# Patient Record
Sex: Male | Born: 1970
Health system: Southern US, Community
[De-identification: ages and names within clinical notes are randomized; demographics above are authoritative.]

## PROBLEM LIST (undated history)

## (undated) DIAGNOSIS — Z992 Dependence on renal dialysis: Secondary | ICD-10-CM

## (undated) DIAGNOSIS — E119 Type 2 diabetes mellitus without complications: Secondary | ICD-10-CM

## (undated) DIAGNOSIS — G629 Polyneuropathy, unspecified: Secondary | ICD-10-CM

## (undated) DIAGNOSIS — K219 Gastro-esophageal reflux disease without esophagitis: Secondary | ICD-10-CM

## (undated) DIAGNOSIS — M1990 Unspecified osteoarthritis, unspecified site: Secondary | ICD-10-CM

## (undated) DIAGNOSIS — J45909 Unspecified asthma, uncomplicated: Secondary | ICD-10-CM

## (undated) DIAGNOSIS — G4730 Sleep apnea, unspecified: Secondary | ICD-10-CM

## (undated) DIAGNOSIS — N289 Disorder of kidney and ureter, unspecified: Secondary | ICD-10-CM

## (undated) DIAGNOSIS — E785 Hyperlipidemia, unspecified: Secondary | ICD-10-CM

## (undated) DIAGNOSIS — N400 Enlarged prostate without lower urinary tract symptoms: Secondary | ICD-10-CM

## (undated) DIAGNOSIS — I10 Essential (primary) hypertension: Secondary | ICD-10-CM

## (undated) DIAGNOSIS — M109 Gout, unspecified: Secondary | ICD-10-CM

## (undated) DIAGNOSIS — G2581 Restless legs syndrome: Secondary | ICD-10-CM

## (undated) HISTORY — PX: ANKLE SURGERY: SHX546

---

## 2005-12-12 ENCOUNTER — Emergency Department: Payer: Self-pay | Admitting: Emergency Medicine

## 2011-12-31 ENCOUNTER — Observation Stay: Payer: Self-pay | Admitting: Internal Medicine

## 2011-12-31 LAB — CBC WITH DIFFERENTIAL/PLATELET
Basophil %: 2.1 %
Eosinophil #: 0.2 10*3/uL (ref 0.0–0.7)
Eosinophil %: 2.4 %
HCT: 42.4 % (ref 40.0–52.0)
HGB: 14.2 g/dL (ref 13.0–18.0)
Lymphocyte #: 2 10*3/uL (ref 1.0–3.6)
Lymphocyte %: 19.1 %
MCH: 29.2 pg (ref 26.0–34.0)
MCHC: 33.5 g/dL (ref 32.0–36.0)
MCV: 87 fL (ref 80–100)
Monocyte #: 0.6 x10 3/mm (ref 0.2–1.0)
Neutrophil #: 7.2 10*3/uL — ABNORMAL HIGH (ref 1.4–6.5)
Neutrophil %: 70.2 %
RBC: 4.85 10*6/uL (ref 4.40–5.90)

## 2011-12-31 LAB — BASIC METABOLIC PANEL
Anion Gap: 9 (ref 7–16)
Calcium, Total: 9 mg/dL (ref 8.5–10.1)
Chloride: 108 mmol/L — ABNORMAL HIGH (ref 98–107)
Co2: 28 mmol/L (ref 21–32)
EGFR (African American): 55 — ABNORMAL LOW
EGFR (Non-African Amer.): 48 — ABNORMAL LOW
Osmolality: 287 (ref 275–301)
Potassium: 3.5 mmol/L (ref 3.5–5.1)
Sodium: 145 mmol/L (ref 136–145)

## 2011-12-31 LAB — CK TOTAL AND CKMB (NOT AT ARMC): CK-MB: 2.9 ng/mL (ref 0.5–3.6)

## 2011-12-31 LAB — TROPONIN I
Troponin-I: 0.05 ng/mL
Troponin-I: 0.06 ng/mL — ABNORMAL HIGH

## 2012-12-15 ENCOUNTER — Emergency Department: Payer: Self-pay | Admitting: Emergency Medicine

## 2013-09-27 ENCOUNTER — Emergency Department: Payer: Self-pay | Admitting: Emergency Medicine

## 2013-11-16 ENCOUNTER — Ambulatory Visit: Payer: Self-pay | Admitting: Family Medicine
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
## 2014-11-27 DIAGNOSIS — G4733 Obstructive sleep apnea (adult) (pediatric): Secondary | ICD-10-CM | POA: Insufficient documentation

## 2015-11-30 ENCOUNTER — Encounter: Payer: Self-pay | Admitting: Emergency Medicine

## 2015-11-30 ENCOUNTER — Emergency Department
Admission: EM | Admit: 2015-11-30 | Discharge: 2015-11-30 | Disposition: A | Discharge status: Home / Self Care | Payer: Medicare Other | Attending: Emergency Medicine | Admitting: Emergency Medicine

## 2015-11-30 DIAGNOSIS — K047 Periapical abscess without sinus: Secondary | ICD-10-CM

## 2015-11-30 DIAGNOSIS — F17210 Nicotine dependence, cigarettes, uncomplicated: Secondary | ICD-10-CM | POA: Insufficient documentation

## 2015-11-30 MED ORDER — LIDOCAINE HCL (PF) 1 % IJ SOLN
INTRAMUSCULAR | Status: AC
  Filled 2015-11-30: qty 5

## 2015-11-30 MED ORDER — CEFTRIAXONE SODIUM 250 MG IJ SOLR
250.0000 mg | Freq: Once | INTRAMUSCULAR | Status: AC
  Administered 2015-11-30: 250 mg via INTRAMUSCULAR
  Filled 2015-11-30: qty 250

## 2015-11-30 MED ORDER — HYDROCODONE-ACETAMINOPHEN 5-325 MG PO TABS: 1.0000 | ORAL_TABLET | ORAL | 0 refills | Status: DC | PRN

## 2015-11-30 NOTE — ED Triage Notes (Signed)
Pt has a abscessed tooth and he "popped" it today. His mother told him it had poison in it and he is now worried about it.

## 2015-11-30 NOTE — ED Provider Notes (Signed)
[facility] Emergency Department Provider Note  ____________________________________________  Time seen: Approximately 2:48 PM  I have reviewed the triage vital signs and the nursing notes.   HISTORY  Chief Complaint Abscess    HPI Donald Fernandez is a 45 y.o. male presents for evaluation of dental abscess. Patient states that he had abscesses about this morning took a razor blade and popped it. Now concerned for infection. Denies any other complaints at this time.   History reviewed. No pertinent past medical history.  There are no active problems to display for this patient.   History reviewed. No pertinent surgical history.  Prior to Admission medications   Medication Sig Start Date End Date Taking? Authorizing Provider  HYDROcodone-acetaminophen (NORCO) 5-325 MG tablet Take 1-2 tablets by mouth every 4 (four) hours as needed for moderate pain. 11/30/15   Arlyss Repress, PA-C    Allergies Review of patient's allergies indicates no known allergies.  History reviewed. No pertinent family history.  Social History Social History  Substance Use Topics  . Smoking status: Current Every Day Smoker    Packs/day: 0.50    Types: Cigarettes  . Smokeless tobacco: Never Used  . Alcohol use Yes     Comment: ocassionally    Review of Systems Constitutional: No fever/chills ENT: No sore throat.Positive for dental abscess. Cardiovascular: Denies chest pain. Respiratory: Denies shortness of breath. Musculoskeletal: Negative for back pain. Skin: Negative for rash. Neurological: Negative for headaches, focal weakness or numbness.  10-point ROS otherwise negative.  ____________________________________________   PHYSICAL EXAM:  VITAL SIGNS: ED Triage Vitals  Enc Vitals Group     BP 11/30/15 1351 (!) 160/97     Pulse Rate 11/30/15 1351 71     Resp --      Temp 11/30/15 1351 98.8 F (37.1 C)     Temp Source 11/30/15 1351 Oral     SpO2  11/30/15 1351 96 %     Weight 11/30/15 1352 (!) 340 lb (154.2 kg)     Height 11/30/15 1352 5\' 11"  (1.803 m)     Head Circumference --      Peak Flow --      Pain Score --      Pain Loc --      Pain Edu? --      Excl. in [place]? --     Constitutional: Alert and oriented. Well appearing and in no acute distress. Mouth/Throat: Mucous membranes are moist.  Oropharynx non-erythematous.Obvious dental caries noted. Neck: No stridor.  Supple, full range of motion nontender. Cardiovascular: Normal rate, regular rhythm. Grossly normal heart sounds.  Good peripheral circulation. Respiratory: Normal respiratory effort.  No retractions. Lungs CTAB. Musculoskeletal: No lower extremity tenderness nor edema.  No joint effusions. Neurologic:  Normal speech and language. No gross focal neurologic deficits are appreciated. No gait instability. Skin:  Skin is warm, dry and intact. No rash noted. Psychiatric: Mood and affect are normal. Speech and behavior are normal.  ____________________________________________   LABS (all labs ordered are listed, but only abnormal results are displayed)  Labs Reviewed - No data to display ____________________________________________  EKG   ____________________________________________  RADIOLOGY   ____________________________________________   PROCEDURES  Procedure(s) performed: None  Critical Care performed: No  ____________________________________________   INITIAL IMPRESSION / ASSESSMENT AND PLAN / ED COURSE  Pertinent labs & imaging results that were available during my care of the patient were reviewed by me and considered in my medical decision making (see chart for details).  Review of the [place] CSRS was performed in accordance of the [place] prior to dispensing any controlled drugs.  Acute dental abscess. Patient was given Rocephin 250 mg IM while in the ED. Discharged home for follow-up to the dentist as prescribed.  Clinical Course     ____________________________________________   FINAL CLINICAL IMPRESSION(S) / ED DIAGNOSES  Final diagnoses:  Dental abscess     This chart was dictated using voice recognition software/Dragon. Despite best efforts to proofread, errors can occur which can change the meaning. Any change was purely unintentional.    Arlyss Repress, PA-C 11/30/15 1602    Lisa Roca, MD 12/01/15 1212

## 2015-11-30 NOTE — Discharge Instructions (Signed)
OPTIONS FOR DENTAL FOLLOW UP CARE ° °[place] Department of Health and Human Services - Local Safety Net Dental Clinics °http://www.ncdhhs.gov/dph/oralhealth/services/safetynetclinics.htm °  °[facility] (336-562-3123) ° °[facility] (919-933-9087) ° °[facility] ([phone] ext [phone]) ° °[facility] ([phone]) ° °[facility] ([phone]) °This clinic caters to the indigent population and is on a lottery system. °Location: °[facility], [address], [place] °Clinic Hours: °Wednesdays from 6pm - 9pm, patients seen by a lottery system. °For dates, call or go to www.med.[facility].edu/[facility]/patients/Dental-[facility] °Services: °Cleanings, fillings and simple extractions. °Payment Options: °DENTAL WORK IS FREE OF CHARGE. Bring proof of income or support. °Best way to get seen: °Arrive at 5:15 pm - this is a lottery, NOT first come/first serve, so arriving earlier will not increase your chances of being seen. °  °  °[facility] °[phone] °Select option 1 for emergencies °  °Location: °[facility], [address], [place] °Clinic Hours: °No walk-ins accepted - call the day before to schedule an appointment. °Check in times are 9:30 am and 1:30 pm. °Services: °Simple extractions, temporary fillings, pulpectomy/pulp debridement, uncomplicated abscess drainage. °Payment Options: °PAYMENT IS DUE AT THE TIME OF SERVICE.  Fee is usually $100-200, additional surgical procedures (e.g. abscess drainage) may be extra. °Cash, checks, Visa/MasterCard accepted.  Can file Medicaid if patient is covered for dental - patient should call case worker to check. °No discount for [facility] patients. °Best way to get seen: °MUST call the day before and get onto the schedule. Can usually be seen the next 1-2 days. No walk-ins accepted. °  °  °[facility] °[phone] °   °Location: °[facility], [address], [place] °Clinic Hours: °M, W, Th, F 8am or 1:30pm, Tues 9a or 1:30 - first come/first served. °Services: °Simple extractions, temporary fillings, uncomplicated abscess drainage.  You do not need to be an [place] resident. °Payment Options: °PAYMENT IS DUE AT THE TIME OF SERVICE. °Dental insurance, otherwise sliding scale - bring proof of income or support. °Depending on income and treatment needed, cost is usually $50-200. °Best way to get seen: °Arrive early as it is first come/first served. °  °  °[facility] °[phone] °  °Location: °[address] °Clinic Hours: °Mon-Thu 8a-5p °Services: °Most basic dental services including extractions and fillings. °Payment Options: °PAYMENT IS DUE AT THE TIME OF SERVICE. °Sliding scale, up to 50% off - bring proof if income or support. °Medicaid with dental option accepted. °Best way to get seen: °Call to schedule an appointment, can usually be seen within 2 weeks OR they will try to see walk-ins - show up at 8a or 2p (you may have to wait). °  °  °[facility] °919-245-2435 °[place] RESIDENTS ONLY °  °Location: °[facility], [address], [place], [place] [address] °Clinic Hours: By appointment only. °Monday - Thursday 8am-5pm, Friday 8am-12pm °Services: Cleanings, fillings, extractions. °Payment Options: °PAYMENT IS DUE AT THE TIME OF SERVICE. °Cash, Visa or MasterCard. Sliding scale - $30 minimum per service. °Best way to get seen: °Come in to office, complete packet and make an appointment - need proof of income °or support monies for each household member and proof of [place] residence. °Usually takes about a month to get in. °  °  °[facility] °[phone] °  °Location: °[address]   [place] °Clinic Hours: Walk-in Urgent Care Dental Services are offered Monday-Friday  mornings only. °The numbers of emergencies accepted daily is limited to the number of °providers available. °Maximum 15 - Mondays, Wednesdays & Thursdays °Maximum 10 - Tuesdays & Fridays °Services: °You do not need to be a [place] resident to be seen for a dental emergency. °Emergencies are defined as pain, swelling, abnormal bleeding, or dental trauma. Walkins will receive x-rays if needed. °NOTE: Dental cleaning is not an emergency. °Payment Options: °PAYMENT IS DUE AT THE TIME OF SERVICE. °Minimum co-pay is $40.00 for uninsured patients. °Minimum co-pay is $3.00 for Medicaid with dental coverage. °Dental Insurance is accepted and must be presented at time of visit. °Medicare does not cover dental. °Forms of payment: Cash, credit card, checks. °Best way to get seen: °If not previously registered with the clinic, walk-in dental registration begins at 7:15 am and is on a first come/first serve basis. °If previously registered with the clinic, call to make an appointment. °  °  °[facility] °919-776-4359 °[place] RESIDENTS ONLY °  °Location: °[address], [place], [place] °Clinic Hours: °Mon-Thu 10a-2p °Services: Extractions only! °Payment Options: °FREE (donations accepted) - bring proof of income or support °Best way to get seen: °Call and schedule an appointment OR come at 8am on the 1st Monday of every month (except for holidays) when it is first come/first served. °  °  °[facility] °[phone] °  °Location: °[address], [place] °Clinic Hours: °Friday mornings °Services, Payment Options, Best way to get seen: °Call for info °

## 2016-02-16 ENCOUNTER — Other Ambulatory Visit: Payer: Self-pay | Admitting: Ophthalmology

## 2016-02-16 DIAGNOSIS — H4710 Unspecified papilledema: Secondary | ICD-10-CM

## 2016-03-09 ENCOUNTER — Other Ambulatory Visit
Admission: RE | Admit: 2016-03-09 | Discharge: 2016-03-09 | Disposition: A | Discharge status: Home / Self Care | Payer: Medicare Other | Source: Ambulatory Visit | Attending: Ophthalmology | Admitting: Ophthalmology

## 2016-03-09 ENCOUNTER — Ambulatory Visit
Admission: RE | Admit: 2016-03-09 | Discharge: 2016-03-09 | Disposition: A | Discharge status: Home / Self Care | Payer: Medicare Other | Source: Ambulatory Visit | Attending: Ophthalmology | Admitting: Ophthalmology

## 2016-03-09 ENCOUNTER — Other Ambulatory Visit: Payer: Self-pay | Admitting: Ophthalmology

## 2016-03-09 DIAGNOSIS — H4710 Unspecified papilledema: Secondary | ICD-10-CM | POA: Insufficient documentation

## 2016-03-09 DIAGNOSIS — I6782 Cerebral ischemia: Secondary | ICD-10-CM | POA: Diagnosis not present

## 2016-03-09 LAB — CREATININE, SERUM
CREATININE: 4.36 mg/dL — AB (ref 0.61–1.24)
GFR calc Af Amer: 17 mL/min — ABNORMAL LOW (ref >60)
GFR, EST NON AFRICAN AMERICAN: 15 mL/min — AB (ref >60)

## 2016-05-17 DIAGNOSIS — H4710 Unspecified papilledema: Secondary | ICD-10-CM | POA: Diagnosis not present

## 2016-06-02 DIAGNOSIS — H6093 Unspecified otitis externa, bilateral: Secondary | ICD-10-CM | POA: Diagnosis not present

## 2016-07-08 DIAGNOSIS — I10 Essential (primary) hypertension: Secondary | ICD-10-CM | POA: Diagnosis not present

## 2016-07-08 DIAGNOSIS — E119 Type 2 diabetes mellitus without complications: Secondary | ICD-10-CM | POA: Diagnosis not present

## 2016-07-08 DIAGNOSIS — E669 Obesity, unspecified: Secondary | ICD-10-CM | POA: Diagnosis not present

## 2016-07-08 DIAGNOSIS — N184 Chronic kidney disease, stage 4 (severe): Secondary | ICD-10-CM | POA: Diagnosis not present

## 2016-07-08 DIAGNOSIS — G4733 Obstructive sleep apnea (adult) (pediatric): Secondary | ICD-10-CM | POA: Diagnosis not present

## 2016-07-08 DIAGNOSIS — H47093 Other disorders of optic nerve, not elsewhere classified, bilateral: Secondary | ICD-10-CM | POA: Diagnosis not present

## 2016-07-09 ENCOUNTER — Inpatient Hospital Stay
Admission: EM | Admit: 2016-07-09 | Discharge: 2016-07-11 | DRG: 683 | Disposition: A | Discharge status: Home / Self Care | Payer: PPO | Attending: Internal Medicine | Admitting: Internal Medicine

## 2016-07-09 ENCOUNTER — Emergency Department: Payer: PPO

## 2016-07-09 ENCOUNTER — Encounter: Payer: Self-pay | Admitting: Emergency Medicine

## 2016-07-09 DIAGNOSIS — Z794 Long term (current) use of insulin: Secondary | ICD-10-CM | POA: Diagnosis not present

## 2016-07-09 DIAGNOSIS — E872 Acidosis: Secondary | ICD-10-CM | POA: Diagnosis not present

## 2016-07-09 DIAGNOSIS — H6690 Otitis media, unspecified, unspecified ear: Secondary | ICD-10-CM | POA: Diagnosis present

## 2016-07-09 DIAGNOSIS — Z716 Tobacco abuse counseling: Secondary | ICD-10-CM | POA: Diagnosis not present

## 2016-07-09 DIAGNOSIS — Z8249 Family history of ischemic heart disease and other diseases of the circulatory system: Secondary | ICD-10-CM

## 2016-07-09 DIAGNOSIS — Z7982 Long term (current) use of aspirin: Secondary | ICD-10-CM | POA: Diagnosis not present

## 2016-07-09 DIAGNOSIS — E1122 Type 2 diabetes mellitus with diabetic chronic kidney disease: Secondary | ICD-10-CM | POA: Diagnosis not present

## 2016-07-09 DIAGNOSIS — E876 Hypokalemia: Secondary | ICD-10-CM | POA: Diagnosis present

## 2016-07-09 DIAGNOSIS — R7989 Other specified abnormal findings of blood chemistry: Secondary | ICD-10-CM | POA: Diagnosis not present

## 2016-07-09 DIAGNOSIS — N179 Acute kidney failure, unspecified: Secondary | ICD-10-CM | POA: Diagnosis not present

## 2016-07-09 DIAGNOSIS — Z79899 Other long term (current) drug therapy: Secondary | ICD-10-CM | POA: Diagnosis not present

## 2016-07-09 DIAGNOSIS — I120 Hypertensive chronic kidney disease with stage 5 chronic kidney disease or end stage renal disease: Secondary | ICD-10-CM | POA: Diagnosis present

## 2016-07-09 DIAGNOSIS — Z833 Family history of diabetes mellitus: Secondary | ICD-10-CM

## 2016-07-09 DIAGNOSIS — F17210 Nicotine dependence, cigarettes, uncomplicated: Secondary | ICD-10-CM | POA: Diagnosis not present

## 2016-07-09 DIAGNOSIS — N189 Chronic kidney disease, unspecified: Secondary | ICD-10-CM

## 2016-07-09 DIAGNOSIS — N185 Chronic kidney disease, stage 5: Secondary | ICD-10-CM | POA: Diagnosis not present

## 2016-07-09 DIAGNOSIS — D638 Anemia in other chronic diseases classified elsewhere: Secondary | ICD-10-CM | POA: Diagnosis not present

## 2016-07-09 DIAGNOSIS — N19 Unspecified kidney failure: Secondary | ICD-10-CM

## 2016-07-09 DIAGNOSIS — I10 Essential (primary) hypertension: Secondary | ICD-10-CM | POA: Diagnosis not present

## 2016-07-09 DIAGNOSIS — G4733 Obstructive sleep apnea (adult) (pediatric): Secondary | ICD-10-CM | POA: Diagnosis present

## 2016-07-09 DIAGNOSIS — Z6841 Body Mass Index (BMI) 40.0 and over, adult: Secondary | ICD-10-CM

## 2016-07-09 DIAGNOSIS — D696 Thrombocytopenia, unspecified: Secondary | ICD-10-CM | POA: Diagnosis present

## 2016-07-09 DIAGNOSIS — N184 Chronic kidney disease, stage 4 (severe): Secondary | ICD-10-CM | POA: Diagnosis not present

## 2016-07-09 HISTORY — DX: Disorder of kidney and ureter, unspecified: N28.9

## 2016-07-09 HISTORY — DX: Essential (primary) hypertension: I10

## 2016-07-09 HISTORY — DX: Type 2 diabetes mellitus without complications: E11.9

## 2016-07-09 LAB — APTT: APTT: 30 s (ref 24–36)

## 2016-07-09 LAB — URINALYSIS, COMPLETE (UACMP) WITH MICROSCOPIC
BACTERIA UA: NONE SEEN
BILIRUBIN URINE: NEGATIVE
GLUCOSE, UA: NEGATIVE mg/dL
Hgb urine dipstick: NEGATIVE
KETONES UR: NEGATIVE mg/dL
NITRITE: NEGATIVE
PROTEIN: 100 mg/dL — AB
Specific Gravity, Urine: 1.009 (ref 1.005–1.030)
pH: 6 (ref 5.0–8.0)

## 2016-07-09 LAB — CBC WITH DIFFERENTIAL/PLATELET
BASOS ABS: 0 10*3/uL (ref 0–0.1)
BASOS PCT: 1 %
Eosinophils Absolute: 0.3 10*3/uL (ref 0–0.7)
Eosinophils Relative: 5 %
HEMATOCRIT: 33 % — AB (ref 40.0–52.0)
Hemoglobin: 10.7 g/dL — ABNORMAL LOW (ref 13.0–18.0)
LYMPHS PCT: 14 %
Lymphs Abs: 0.8 10*3/uL — ABNORMAL LOW (ref 1.0–3.6)
MCH: 28.1 pg (ref 26.0–34.0)
MCHC: 32.3 g/dL (ref 32.0–36.0)
MCV: 86.9 fL (ref 80.0–100.0)
Monocytes Absolute: 0.6 10*3/uL (ref 0.2–1.0)
Monocytes Relative: 10 %
NEUTROS ABS: 3.8 10*3/uL (ref 1.4–6.5)
Neutrophils Relative %: 70 %
PLATELETS: 145 10*3/uL — AB (ref 150–440)
RBC: 3.8 MIL/uL — AB (ref 4.40–5.90)
RDW: 16.7 % — ABNORMAL HIGH (ref 11.5–14.5)
WBC: 5.5 10*3/uL (ref 3.8–10.6)

## 2016-07-09 LAB — COMPREHENSIVE METABOLIC PANEL
ALT: 46 U/L (ref 17–63)
ANION GAP: 8 (ref 5–15)
AST: 38 U/L (ref 15–41)
Albumin: 3.4 g/dL — ABNORMAL LOW (ref 3.5–5.0)
Alkaline Phosphatase: 75 U/L (ref 38–126)
BILIRUBIN TOTAL: 0.4 mg/dL (ref 0.3–1.2)
BUN: 78 mg/dL — ABNORMAL HIGH (ref 6–20)
CO2: 17 mmol/L — ABNORMAL LOW (ref 22–32)
Calcium: 8.3 mg/dL — ABNORMAL LOW (ref 8.9–10.3)
Chloride: 115 mmol/L — ABNORMAL HIGH (ref 101–111)
Creatinine, Ser: 7.72 mg/dL — ABNORMAL HIGH (ref 0.61–1.24)
GFR, EST AFRICAN AMERICAN: 9 mL/min — AB (ref >60)
GFR, EST NON AFRICAN AMERICAN: 7 mL/min — AB (ref >60)
Glucose, Bld: 104 mg/dL — ABNORMAL HIGH (ref 65–99)
POTASSIUM: 3.3 mmol/L — AB (ref 3.5–5.1)
Sodium: 140 mmol/L (ref 135–145)
TOTAL PROTEIN: 6.9 g/dL (ref 6.5–8.1)

## 2016-07-09 LAB — CBC
HCT: 32.7 % — ABNORMAL LOW (ref 40.0–52.0)
Hemoglobin: 10.6 g/dL — ABNORMAL LOW (ref 13.0–18.0)
MCH: 28.3 pg (ref 26.0–34.0)
MCHC: 32.5 g/dL (ref 32.0–36.0)
MCV: 87.1 fL (ref 80.0–100.0)
PLATELETS: 123 10*3/uL — AB (ref 150–440)
RBC: 3.76 MIL/uL — ABNORMAL LOW (ref 4.40–5.90)
RDW: 16.2 % — AB (ref 11.5–14.5)
WBC: 5.4 10*3/uL (ref 3.8–10.6)

## 2016-07-09 LAB — BETA-HYDROXYBUTYRIC ACID: BETA-HYDROXYBUTYRIC ACID: 0.1 mmol/L (ref 0.05–0.27)

## 2016-07-09 LAB — LACTIC ACID, PLASMA: LACTIC ACID, VENOUS: 0.7 mmol/L (ref 0.5–1.9)

## 2016-07-09 LAB — TSH: TSH: 2.476 u[IU]/mL (ref 0.350–4.500)

## 2016-07-09 LAB — GLUCOSE, CAPILLARY
Glucose-Capillary: 136 mg/dL — ABNORMAL HIGH (ref 65–99)
Glucose-Capillary: 106 mg/dL — ABNORMAL HIGH (ref 65–99)

## 2016-07-09 LAB — PROTIME-INR
INR: 1.06
Prothrombin Time: 13.8 seconds (ref 11.4–15.2)

## 2016-07-09 LAB — CREATININE, SERUM
Creatinine, Ser: 7.76 mg/dL — ABNORMAL HIGH (ref 0.61–1.24)
GFR calc Af Amer: 9 mL/min — ABNORMAL LOW (ref >60)
GFR calc non Af Amer: 7 mL/min — ABNORMAL LOW (ref >60)

## 2016-07-09 MED ORDER — CIPROFLOXACIN-DEXAMETHASONE 0.3-0.1 % OT SUSP
4.0000 [drp] | Freq: Two times a day (BID) | OTIC | Status: DC
  Administered 2016-07-09 – 2016-07-11 (×4): 4 [drp] via OTIC
  Filled 2016-07-09: qty 7.5

## 2016-07-09 MED ORDER — INSULIN ASPART 100 UNIT/ML ~~LOC~~ SOLN
0.0000 [IU] | Freq: Three times a day (TID) | SUBCUTANEOUS | Status: DC
  Administered 2016-07-09: 18:00:00 1 [IU] via SUBCUTANEOUS
  Filled 2016-07-09: qty 1

## 2016-07-09 MED ORDER — AMOXICILLIN 500 MG PO CAPS
500.0000 mg | ORAL_CAPSULE | Freq: Three times a day (TID) | ORAL | Status: DC
  Administered 2016-07-09 – 2016-07-10 (×3): 500 mg via ORAL
  Filled 2016-07-09 (×5): qty 1

## 2016-07-09 MED ORDER — ACETAMINOPHEN 650 MG RE SUPP: 650.0000 mg | Freq: Four times a day (QID) | RECTAL | Status: DC | PRN

## 2016-07-09 MED ORDER — GABAPENTIN 100 MG PO CAPS
100.0000 mg | ORAL_CAPSULE | Freq: Three times a day (TID) | ORAL | Status: DC
  Administered 2016-07-09 – 2016-07-11 (×6): 100 mg via ORAL
  Filled 2016-07-09 (×6): qty 1

## 2016-07-09 MED ORDER — ACETAMINOPHEN 325 MG PO TABS: 650.0000 mg | ORAL_TABLET | Freq: Four times a day (QID) | ORAL | Status: DC | PRN

## 2016-07-09 MED ORDER — AMLODIPINE BESYLATE 5 MG PO TABS
5.0000 mg | ORAL_TABLET | Freq: Every day | ORAL | Status: DC
  Administered 2016-07-09 – 2016-07-10 (×2): 5 mg via ORAL
  Filled 2016-07-09 (×2): qty 1

## 2016-07-09 MED ORDER — ONDANSETRON HCL 4 MG/2ML IJ SOLN: 4.0000 mg | Freq: Four times a day (QID) | INTRAMUSCULAR | Status: DC | PRN

## 2016-07-09 MED ORDER — SENNA 8.6 MG PO TABS
1.0000 | ORAL_TABLET | Freq: Two times a day (BID) | ORAL | Status: DC
  Administered 2016-07-10: 21:00:00 8.6 mg via ORAL
  Filled 2016-07-09 (×4): qty 1

## 2016-07-09 MED ORDER — NICOTINE 7 MG/24HR TD PT24
7.0000 mg | MEDICATED_PATCH | Freq: Every day | TRANSDERMAL | Status: DC | PRN
  Filled 2016-07-09 (×2): qty 1

## 2016-07-09 MED ORDER — DOCUSATE SODIUM 100 MG PO CAPS
100.0000 mg | ORAL_CAPSULE | Freq: Two times a day (BID) | ORAL | Status: DC
  Administered 2016-07-10: 100 mg via ORAL
  Filled 2016-07-09 (×4): qty 1

## 2016-07-09 MED ORDER — SODIUM CHLORIDE 0.9% FLUSH
3.0000 mL | Freq: Two times a day (BID) | INTRAVENOUS | Status: DC
  Administered 2016-07-09 – 2016-07-11 (×5): 3 mL via INTRAVENOUS

## 2016-07-09 MED ORDER — HEPARIN SODIUM (PORCINE) 5000 UNIT/ML IJ SOLN
5000.0000 [IU] | Freq: Three times a day (TID) | INTRAMUSCULAR | Status: DC
  Administered 2016-07-09 – 2016-07-11 (×5): 5000 [IU] via SUBCUTANEOUS
  Filled 2016-07-09 (×5): qty 1

## 2016-07-09 MED ORDER — ONDANSETRON HCL 4 MG PO TABS: 4.0000 mg | ORAL_TABLET | Freq: Four times a day (QID) | ORAL | Status: DC | PRN

## 2016-07-09 MED ORDER — SODIUM CHLORIDE 0.9 % IV SOLN
INTRAVENOUS | Status: DC
  Administered 2016-07-09 – 2016-07-11 (×4): via INTRAVENOUS

## 2016-07-09 MED ORDER — INSULIN ASPART 100 UNIT/ML ~~LOC~~ SOLN
0.0000 [IU] | Freq: Every day | SUBCUTANEOUS | Status: DC
Start: 2016-07-09 — End: 2016-07-11

## 2016-07-09 MED ORDER — INSULIN ASPART 100 UNIT/ML ~~LOC~~ SOLN
3.0000 [IU] | Freq: Three times a day (TID) | SUBCUTANEOUS | Status: DC
  Administered 2016-07-09 – 2016-07-10 (×3): 3 [IU] via SUBCUTANEOUS
  Filled 2016-07-09 (×3): qty 3

## 2016-07-09 NOTE — ED Notes (Signed)
Pt up to the restroom independently. Pt in NAD.

## 2016-07-09 NOTE — ED Triage Notes (Signed)
Patient presents to the ED from the [facility] clinic after patient had labs showing that his creatinine had increased from 3.46 at last check-up to 7.05 yesterday.  Patient also had a BUN of 70 and a GFR of 10.  Patient has history of stage 4 chronic kidney disease but is not on dialysis.  Patient denies any new symptoms.  Denies pain.

## 2016-07-09 NOTE — ED Notes (Signed)
Pt sent over from [facility] for abnormal lab work, kidney function was elevated, pt denies any urinary symptoms, states he is still making urine, denies any pain, awake and alert, family at bedside, EDP at bedside

## 2016-07-09 NOTE — H&P (Signed)
[facility] NAME: Donald Fernandez    MR#:  [id number]  DATE OF BIRTH:  1971-02-04  DATE OF ADMISSION:  07/09/2016  PRIMARY CARE PHYSICIAN: [facility]   REQUESTING/REFERRING PHYSICIAN:   CHIEF COMPLAINT:   Chief Complaint  Patient presents with  . Abnormal Lab    HISTORY OF PRESENT ILLNESS: Donald Fernandez  is a 46 y.o. male with a known history of Notable medical problems including obstructive sleep apnea, on CPAP, chronic renal failure, GFR 55 in September 2013, GFR 17. In November 2017, COPD, restless leg syndrome, gout, vitamin D deficiency, chronic right ankle pain, BPH, hypertension, tobacco abuse, hypercholesterolemia, diabetes, who presents to the hospital with complaints of worsening kidney function. Apparently patient had his labs checked at his primary care physician, was told that he had significant renal abnormality, was sent to emergency room for further evaluation and treatment. In emergency room, patient's creatinine was 7.72. Hospitalist services were contacted for admission  PAST MEDICAL HISTORY:   Past Medical History:  Diagnosis Date  . Diabetes mellitus without complication ([place])   . Hypertension   . Renal disorder    CKD4    PAST SURGICAL HISTORY: Past Surgical History:  Procedure Laterality Date  . ANKLE SURGERY      SOCIAL HISTORY:  Social History  Substance Use Topics  . Smoking status: Current Every Day Smoker    Packs/day: 0.50    Types: Cigarettes  . Smokeless tobacco: Never Used  . Alcohol use Yes     Comment: ocassionally    FAMILY HISTORY: The patient's mother has Sleep apnea , diabetes ,  coronary artery disease , hypertension .   DRUG ALLERGIES: No Known Allergies  Review of Systems  Constitutional: Negative for chills, fever and weight loss.  HENT: Negative for congestion.   Eyes: Negative for blurred vision and double vision.  Respiratory: Positive for  shortness of breath and wheezing. Negative for cough and sputum production.   Cardiovascular: Negative for chest pain, palpitations, orthopnea, leg swelling and PND.  Gastrointestinal: Negative for abdominal pain, blood in stool, constipation, diarrhea, nausea and vomiting.  Genitourinary: Negative for dysuria, frequency, hematuria and urgency.  Musculoskeletal: Negative for falls.  Neurological: Positive for weakness. Negative for dizziness, tremors, focal weakness and headaches.  Endo/Heme/Allergies: Does not bruise/bleed easily.  Psychiatric/Behavioral: Negative for depression. The patient does not have insomnia.     MEDICATIONS AT HOME:  Prior to Admission medications   Medication Sig Start Date End Date Taking? Authorizing Provider  acetaZOLAMIDE (DIAMOX) 250 MG tablet Take 250 mg by mouth 4 (four) times daily.   Yes Historical Provider, MD  allopurinol (ZYLOPRIM) 100 MG tablet Take 100 mg by mouth daily.   Yes Historical Provider, MD  amLODipine (NORVASC) 10 MG tablet Take 10 mg by mouth daily.   Yes Historical Provider, MD  amoxicillin (AMOXIL) 500 MG capsule Take 500 mg by mouth 3 (three) times daily.   Yes Historical Provider, MD  aspirin EC 81 MG tablet Take 81 mg by mouth daily.   Yes Historical Provider, MD  atorvastatin (LIPITOR) 20 MG tablet Take 20 mg by mouth daily.   Yes Historical Provider, MD  Cholecalciferol (VITAMIN D PO) Take 5,000 Units by mouth daily.   Yes Historical Provider, MD  ciprofloxacin-dexamethasone (CIPRODEX) otic suspension 4 drops 2 (two) times daily.   Yes Historical Provider, MD  cloNIDine (CATAPRES) 0.3 MG tablet Take 0.3 mg by mouth 3 (three)  times daily.   Yes Historical Provider, MD  doxazosin (CARDURA) 8 MG tablet Take 8 mg by mouth at bedtime.   Yes Historical Provider, MD  furosemide (LASIX) 40 MG tablet Take 40 mg by mouth.   Yes Historical Provider, MD  gabapentin (NEURONTIN) 300 MG capsule Take 1,200 mg by mouth daily.   Yes Historical  Provider, MD  glipiZIDE (GLUCOTROL XL) 5 MG 24 hr tablet Take 5 mg by mouth daily with breakfast.   Yes Historical Provider, MD  hydrALAZINE (APRESOLINE) 100 MG tablet Take 100 mg by mouth 3 (three) times daily.   Yes Historical Provider, MD  losartan (COZAAR) 50 MG tablet Take 50 mg by mouth daily.   Yes Historical Provider, MD  metoprolol (TOPROL-XL) 200 MG 24 hr tablet Take 200 mg by mouth daily.   Yes Historical Provider, MD  tamsulosin (FLOMAX) 0.4 MG CAPS capsule Take 0.4 mg by mouth.   Yes Historical Provider, MD  traZODone (DESYREL) 100 MG tablet Take 100 mg by mouth at bedtime.   Yes Historical Provider, MD  vitamin E 400 UNIT capsule Take 400 Units by mouth daily.   Yes Historical Provider, MD  HYDROcodone-acetaminophen (NORCO) 5-325 MG tablet Take 1-2 tablets by mouth every 4 (four) hours as needed for moderate pain. 11/30/15   Pierce Crane Beers, PA-C      PHYSICAL EXAMINATION:   VITAL SIGNS: Blood pressure (!) 116/94, pulse 65, temperature 98.3 F (36.8 C), temperature source Oral, resp. rate 17, height 5\' 11"  (1.803 m), weight (!) 172.4 kg (380 lb), SpO2 100 %.  GENERAL:  46 y.o.-year-old Morbidly obese patient lying in the bed with no acute distress.  EYES: Pupils equal, round, reactive to light and accommodation. No scleral icterus. Extraocular muscles intact.  HEENT: Head atraumatic, normocephalic. Oropharynx and nasopharynx clear.  NECK:  Supple, no jugular venous distention. No thyroid enlargement, no tenderness.  LUNGS: Markedly diminished breath sounds bilaterally, scattered wheezing, no rales,rhonchi or crepitations. Intermittent use of accessory muscles of respiration, with exertion and speech.  CARDIOVASCULAR: S1, S2 distant, rhythm is regular. No murmurs, rubs, or gallops.  ABDOMEN: Soft, nontender, nondistended. Bowel sounds present. No organomegaly or mass.  EXTREMITIES: 1+ lower extremity and pedal edema, no cyanosis, or clubbing.  NEUROLOGIC: Cranial nerves II  through XII are intact. Muscle strength 5/5 in all extremities. Sensation intact. Gait not checked.  PSYCHIATRIC: The patient is alert and oriented x 3. Morbidly obese, has difficulty to move , flat affect SKIN: No obvious rash, lesion, or ulcer.   LABORATORY PANEL:   CBC  Recent Labs Lab 07/09/16 0944  WBC 5.5  HGB 10.7*  HCT 33.0*  PLT 145*  MCV 86.9  MCH 28.1  MCHC 32.3  RDW 16.7*  LYMPHSABS 0.8*  MONOABS 0.6  EOSABS 0.3  BASOSABS 0.0   ------------------------------------------------------------------------------------------------------------------  Chemistries   Recent Labs Lab 07/09/16 0926  NA 140  K 3.3*  CL 115*  CO2 17*  GLUCOSE 104*  BUN 78*  CREATININE 7.72*  CALCIUM 8.3*  AST 38  ALT 46  ALKPHOS 75  BILITOT 0.4   ------------------------------------------------------------------------------------------------------------------  Cardiac Enzymes No results for input(s): TROPONINI in the last 168 hours. ------------------------------------------------------------------------------------------------------------------  RADIOLOGY: Ct Renal Stone Study  Result Date: 07/09/2016 CLINICAL DATA:  Elevated creatinine level EXAM: CT ABDOMEN AND PELVIS WITHOUT CONTRAST TECHNIQUE: Multidetector CT imaging of the abdomen and pelvis was performed following the standard protocol without IV contrast. COMPARISON:  None. FINDINGS: Lower chest: Bibasilar atelectasis. Hepatobiliary: Liver is unremarkable. Gallbladder is  unremarkable. There is a calcification adjacent to the neck of the gallbladder of unknown significance. Pancreas: Unremarkable Spleen: Unremarkable Adrenals/Urinary Tract: Kidneys and adrenal glands are unremarkable. Bladder is unremarkable. Stomach/Bowel: Normal appendix. No obvious mass. Moderate stool burden throughout the ascending and transverse colon. No evidence of small-bowel obstruction. Vascular/Lymphatic: No abnormal retroperitoneal adenopathy.  Reproductive: No evidence of prostate enlargement. Other: No free-fluid. Musculoskeletal: No vertebral compression deformity. Degenerative changes of the SI joints are noted. Mild lower lumbar facet arthropathy. IMPRESSION: No acute pathology. Electronically Signed   By: Marybelle Killings M.D.   On: 07/09/2016 11:06    EKG: Orders placed or performed in visit on 12/31/11  . EKG 12-Lead    IMPRESSION AND PLAN:  Active Problems:   Acute on chronic renal failure (HCC)  #1. Acute on chronic renal failure, admit patient to medical floor, suspend diuretics, ARB, initiate IV fluids, get nephrologist involved for recommendations, CT of abdomen and pelvis showed no hydronephrosis, stones #2. Essential hypertension, continue outpatient medications, suspend diuretics #3. Hypokalemia, supplement orally #4. Acidosis, likely due to renal failure, follow with therapy, may need bicarbonate orally  #5. Anemia of chronic disease, follow with hydration #6, thrombocytopenia, follow labs, get pro time/INR, PTT, get manual differential if worsens #7. Diabetes, continue sliding scale insulin while in the hospital, diabetic diet #8. Tobacco abuse. Counseling, discussed this patient for 4 minutes, nicotine replacement therapy will be initiated    All the records are reviewed and case discussed with ED provider. Management plans discussed with the patient, family and they are in agreement.  CODE STATUS: Code Status History    This patient does not have a recorded code status. Please follow your organizational policy for patients in this situation.       TOTAL TIME TAKING CARE OF THIS PATIENT: 50  minutes.    Theodoro Grist M.D on 07/09/2016 at 11:42 AM  Between 7am to 6pm - Pager - [phone] After 6pm go to [email or web address] - password [email or web address] [place] [facility]  Office  [phone]  CC: Primary care physician; [place]

## 2016-07-09 NOTE — ED Provider Notes (Signed)
[facility] Emergency Department Provider Note   ____________________________________________   First MD Initiated Contact with Patient 07/09/16 [phone]     (approximate)  I have reviewed the triage vital signs and the nursing notes.   HISTORY  Chief Complaint Abnormal Lab    HPI Donald Fernandez is a 46 y.o. male she had blood drawn that showed a elevated creatinine yesterday. It had increased from 7.46-7.05 yesterday it's higher today. Patient has high blood pressure and diabetes. Patient has no complaints she feels well and is not short of breath has no edema making the normal amount of urine however his creatinine has doubled since November.  Past Medical History:  Diagnosis Date  . Diabetes mellitus without complication ([place])   . Hypertension   . Renal disorder    CKD4    There are no active problems to display for this patient.   Past Surgical History:  Procedure Laterality Date  . ANKLE SURGERY      Prior to Admission medications   Medication Sig Start Date End Date Taking? Authorizing Provider  HYDROcodone-acetaminophen (NORCO) 5-325 MG tablet Take 1-2 tablets by mouth every 4 (four) hours as needed for moderate pain. 11/30/15   Arlyss Repress, PA-C    Allergies Patient has no known allergies.  No family history on file.  Social History Social History  Substance Use Topics  . Smoking status: Current Every Day Smoker    Packs/day: 0.50    Types: Cigarettes  . Smokeless tobacco: Never Used  . Alcohol use Yes     Comment: ocassionally    Review of Systems Constitutional: No fever/chills Eyes: No visual changes. ENT: No sore throat. Cardiovascular: Denies chest pain. Respiratory: Denies shortness of breath. Gastrointestinal: No abdominal pain.  No nausea, no vomiting.  No diarrhea.  No constipation. Genitourinary: Negative for dysuria. Musculoskeletal: Negative for back pain. Skin: Negative for rash. Neurological: Negative  for headaches, focal weakness  10-point ROS otherwise negative.  ____________________________________________   PHYSICAL EXAM:  VITAL SIGNS: ED Triage Vitals  Enc Vitals Group     BP 07/09/16 0927 133/86     Pulse Rate 07/09/16 0927 69     Resp 07/09/16 0927 18     Temp 07/09/16 0927 98.3 F (36.8 C)     Temp Source 07/09/16 0927 Oral     SpO2 07/09/16 0927 96 %     Weight 07/09/16 0922 (!) 380 lb (172.4 kg)     Height 07/09/16 0922 5\' 11"  (1.803 m)     Head Circumference --      Peak Flow --      Pain Score --      Pain Loc --      Pain Edu? --      Excl. in [place]? --     Constitutional: Alert and oriented. Well appearing and in no acute distress. Eyes: Conjunctivae are normal. PERRL. EOMI. Head: Atraumatic. Nose: No congestion/rhinnorhea. Mouth/Throat: Mucous membranes are moist.  Oropharynx non-erythematous. Neck: No stridor. Cardiovascular: Normal rate, regular rhythm. Grossly normal heart sounds.  Good peripheral circulation. Respiratory: Normal respiratory effort.  No retractions. Lungs CTAB. Gastrointestinal: Soft and nontender. No distention. No abdominal bruits. No CVA tenderness. Musculoskeletal: No lower extremity tenderness nor edema.  No joint effusions. Neurologic:  Normal speech and language. No gross focal neurologic deficits are appreciated. No gait instability. Skin:  Skin is warm, dry and intact. No rash noted.  ____________________________________________   LABS (all labs ordered are listed, but  only abnormal results are displayed)  Labs Reviewed  COMPREHENSIVE METABOLIC PANEL - Abnormal; Notable for the following:       Result Value   Potassium 3.3 (*)    Chloride 115 (*)    CO2 17 (*)    Glucose, Bld 104 (*)    BUN 78 (*)    Creatinine, Ser 7.72 (*)    Calcium 8.3 (*)    Albumin 3.4 (*)    GFR calc non Af Amer 7 (*)    GFR calc Af Amer 9 (*)    All other components within normal limits  CBC WITH DIFFERENTIAL/PLATELET - Abnormal; Notable  for the following:    RBC 3.80 (*)    Hemoglobin 10.7 (*)    HCT 33.0 (*)    RDW 16.7 (*)    Platelets 145 (*)    Lymphs Abs 0.8 (*)    All other components within normal limits  URINALYSIS, COMPLETE (UACMP) WITH MICROSCOPIC   ____________________________________________  EKG   ____________________________________________  RADIOLOGY   ____________________________________________   PROCEDURES  Procedure(s) performed: Procedures  Critical Care performed:  ____________________________________________   INITIAL IMPRESSION / ASSESSMENT AND PLAN / ED COURSE  Pertinent labs & imaging results that were available during my care of the patient were reviewed by me and considered in my medical decision making (see chart for details).        ____________________________________________   FINAL CLINICAL IMPRESSION(S) / ED DIAGNOSES  Final diagnoses:  Renal failure  Acute renal failure, unspecified acute renal failure type ([place])      NEW MEDICATIONS STARTED DURING THIS VISIT:  New Prescriptions   No medications on file     Note:  This document was prepared using Dragon voice recognition software and may include unintentional dictation errors.    Nena Polio, MD 07/09/16 [phone]

## 2016-07-10 LAB — HEMOGLOBIN A1C
HEMOGLOBIN A1C: 5.6 % (ref 4.8–5.6)
Mean Plasma Glucose: 114 mg/dL

## 2016-07-10 LAB — BASIC METABOLIC PANEL
Anion gap: 11 (ref 5–15)
BUN: 75 mg/dL — AB (ref 6–20)
CALCIUM: 8.4 mg/dL — AB (ref 8.9–10.3)
CHLORIDE: 117 mmol/L — AB (ref 101–111)
CO2: 14 mmol/L — AB (ref 22–32)
CREATININE: 7.61 mg/dL — AB (ref 0.61–1.24)
GFR calc Af Amer: 9 mL/min — ABNORMAL LOW (ref >60)
GFR calc non Af Amer: 8 mL/min — ABNORMAL LOW (ref >60)
GLUCOSE: 87 mg/dL (ref 65–99)
Potassium: 3.4 mmol/L — ABNORMAL LOW (ref 3.5–5.1)
Sodium: 142 mmol/L (ref 135–145)

## 2016-07-10 LAB — GLUCOSE, CAPILLARY
GLUCOSE-CAPILLARY: 95 mg/dL (ref 65–99)
GLUCOSE-CAPILLARY: 68 mg/dL (ref 65–99)
GLUCOSE-CAPILLARY: 93 mg/dL (ref 65–99)
Glucose-Capillary: 116 mg/dL — ABNORMAL HIGH (ref 65–99)
Glucose-Capillary: 134 mg/dL — ABNORMAL HIGH (ref 65–99)

## 2016-07-10 LAB — HIV ANTIBODY (ROUTINE TESTING W REFLEX): HIV SCREEN 4TH GENERATION: NONREACTIVE

## 2016-07-10 LAB — CBC
HCT: 33.1 % — ABNORMAL LOW (ref 40.0–52.0)
Hemoglobin: 10.8 g/dL — ABNORMAL LOW (ref 13.0–18.0)
MCH: 28.5 pg (ref 26.0–34.0)
MCHC: 32.6 g/dL (ref 32.0–36.0)
MCV: 87.6 fL (ref 80.0–100.0)
PLATELETS: 138 10*3/uL — AB (ref 150–440)
RBC: 3.78 MIL/uL — AB (ref 4.40–5.90)
RDW: 16.2 % — ABNORMAL HIGH (ref 11.5–14.5)
WBC: 5.9 10*3/uL (ref 3.8–10.6)

## 2016-07-10 LAB — URINE CULTURE: CULTURE: NO GROWTH

## 2016-07-10 MED ORDER — AMOXICILLIN 500 MG PO CAPS
500.0000 mg | ORAL_CAPSULE | Freq: Two times a day (BID) | ORAL | Status: DC
  Administered 2016-07-10 – 2016-07-11 (×2): 500 mg via ORAL
  Filled 2016-07-10: qty 1

## 2016-07-10 MED ORDER — TRAZODONE HCL 100 MG PO TABS
100.0000 mg | ORAL_TABLET | Freq: Every day | ORAL | Status: DC
  Administered 2016-07-10: 21:00:00 100 mg via ORAL
  Filled 2016-07-10: qty 1

## 2016-07-10 MED ORDER — METOPROLOL SUCCINATE ER 50 MG PO TB24
50.0000 mg | ORAL_TABLET | Freq: Every day | ORAL | Status: DC
Start: 2016-07-10 — End: 2016-07-11
  Administered 2016-07-10 – 2016-07-11 (×2): 50 mg via ORAL
  Filled 2016-07-10 (×2): qty 1

## 2016-07-10 MED ORDER — CLONIDINE HCL 0.1 MG PO TABS
0.1000 mg | ORAL_TABLET | Freq: Two times a day (BID) | ORAL | Status: DC
  Administered 2016-07-10 – 2016-07-11 (×3): 0.1 mg via ORAL
  Filled 2016-07-10 (×3): qty 1

## 2016-07-10 MED ORDER — TAMSULOSIN HCL 0.4 MG PO CAPS
0.4000 mg | ORAL_CAPSULE | Freq: Every day | ORAL | Status: DC
  Administered 2016-07-10 – 2016-07-11 (×2): 0.4 mg via ORAL
  Filled 2016-07-10 (×2): qty 1

## 2016-07-10 NOTE — Progress Notes (Signed)
Subjective:   Patient is a pleasant 46 year old African-American male with diabetes, obstructive sleep apnea, hypertension and morbid obesity. He is followed by Dr. Juleen China as outpatient for chronic kidney disease stage IV which is thought to be secondary to diabetic and hypertensive renal disease.  Labs from September 2017 show creatinine was 4.02/GFR 19 This admission, he was asked to come from home for outpatient labs that showed creatinine of 7, GFR less than 10. At present, patient denies any nausea, vomiting, shortness of breath, lower extremity edema He does not have any change in taste. Appetite has been okay.  Objective:  Vital signs in last 24 hours:  Temp:  [97.8 F (36.6 C)-98.7 F (37.1 C)] 98.6 F (37 C) (03/31 0906) Pulse Rate:  [67-81] 78 (03/31 0906) Resp:  [17-20] 20 (03/31 0906) BP: (127-152)/(72-104) 146/83 (03/31 0906) SpO2:  [96 %-100 %] 99 % (03/31 0906)  Weight change:  Filed Weights   07/09/16 0922  Weight: (!) 172.4 kg (380 lb)    Intake/Output:    Intake/Output Summary (Last 24 hours) at 07/10/16 1105 Last data filed at 07/10/16 1026  Gross per 24 hour  Intake           1110.5 ml  Output             1625 ml  Net           -514.5 ml     Physical Exam: General: Sitting up in the bed, no acute distress   HEENT Anicteric, moist oral mucous membranes   Neck Supple   Pulm/lungs Lungs are clear to auscultation, normal breathing effort   CVS/Heart Regular rate and rhythm, no rub or gallop   Abdomen:  Soft, obese, nontender, nondistended   Extremities: No peripheral edema   Neurologic: Alert, oriented   Skin: No acute rashes           Basic Metabolic Panel:   Recent Labs Lab 07/09/16 0926 07/09/16 1408 07/10/16 0408  NA 140  --  142  K 3.3*  --  3.4*  CL 115*  --  117*  CO2 17*  --  14*  GLUCOSE 104*  --  87  BUN 78*  --  75*  CREATININE 7.72* 7.76* 7.61*  CALCIUM 8.3*  --  8.4*     CBC:  Recent Labs Lab 07/09/16 0944  07/09/16 1408 07/10/16 0408  WBC 5.5 5.4 5.9  NEUTROABS 3.8  --   --   HGB 10.7* 10.6* 10.8*  HCT 33.0* 32.7* 33.1*  MCV 86.9 87.1 87.6  PLT 145* 123* 138*      Microbiology:  Recent Results (from the past 720 hour(s))  Urine culture     Status: None   Collection Time: 07/09/16  2:08 PM  Result Value Ref Range Status   Specimen Description URINE, CLEAN CATCH  Final   Special Requests NONE  Final   Culture   Final    NO GROWTH Performed at [facility], [address], [place], [place] [address]    Report Status 07/10/2016 FINAL  Final    Coagulation Studies:  Recent Labs  07/09/16 1215  LABPROT 13.8  INR 1.06    Urinalysis:  Recent Labs  07/09/16 1215  COLORURINE YELLOW*  LABSPEC 1.009  PHURINE 6.0  GLUCOSEU NEGATIVE  HGBUR NEGATIVE  BILIRUBINUR NEGATIVE  KETONESUR NEGATIVE  PROTEINUR 100*  NITRITE NEGATIVE  LEUKOCYTESUR TRACE*      Imaging: Ct Renal Stone Study  Result Date: 07/09/2016 CLINICAL  DATA:  Elevated creatinine level EXAM: CT ABDOMEN AND PELVIS WITHOUT CONTRAST TECHNIQUE: Multidetector CT imaging of the abdomen and pelvis was performed following the standard protocol without IV contrast. COMPARISON:  None. FINDINGS: Lower chest: Bibasilar atelectasis. Hepatobiliary: Liver is unremarkable. Gallbladder is unremarkable. There is a calcification adjacent to the neck of the gallbladder of unknown significance. Pancreas: Unremarkable Spleen: Unremarkable Adrenals/Urinary Tract: Kidneys and adrenal glands are unremarkable. Bladder is unremarkable. Stomach/Bowel: Normal appendix. No obvious mass. Moderate stool burden throughout the ascending and transverse colon. No evidence of small-bowel obstruction. Vascular/Lymphatic: No abnormal retroperitoneal adenopathy. Reproductive: No evidence of prostate enlargement. Other: No free-fluid. Musculoskeletal: No vertebral compression deformity. Degenerative changes of the SI joints are noted. Mild lower  lumbar facet arthropathy. IMPRESSION: No acute pathology. Electronically Signed   By: Marybelle Killings M.D.   On: 07/09/2016 11:06     Medications:   . sodium chloride 75 mL/hr at 07/10/16 0509   . amLODipine  5 mg Oral Daily  . amoxicillin  500 mg Oral Q8H  . ciprofloxacin-dexamethasone  4 drop Right Ear BID  . cloNIDine  0.1 mg Oral BID  . docusate sodium  100 mg Oral BID  . gabapentin  100 mg Oral TID  . heparin  5,000 Units Subcutaneous Q8H  . insulin aspart  0-5 Units Subcutaneous QHS  . insulin aspart  0-9 Units Subcutaneous TID WC  . insulin aspart  3 Units Subcutaneous TID WC  . metoprolol succinate  50 mg Oral Daily  . senna  1 tablet Oral BID  . sodium chloride flush  3 mL Intravenous Q12H  . tamsulosin  0.4 mg Oral Daily  . traZODone  100 mg Oral QHS   acetaminophen **OR** acetaminophen, nicotine, ondansetron **OR** ondansetron (ZOFRAN) IV  Assessment/ Plan:  46 y.o. African-American male With long-standing diabetes type 2, obstructive sleep apnea, morbid obesity, hypertension, hyperlipidemia, BPH, gout, chronic kidney disease stage IV  1. Chronic kidney disease stage V versus acute on chronic kidney disease stage IV Most likely patient's renal disease has progressed to chronic kidney disease stage V He does not have any uremic symptoms at present. However, we had discussion about dialysis in near future. We discussed different options including home peritoneal dialysis in center hemodialysis. He does not have any access at present. Therefore we have ordered vein Mapping to be done in the hospital. If unable to be done over the weekend, it can be scheduled as an outpatient Currently he is getting IV fluids and his diuretics have been held in an effort to correct any underlying volume depletion  2. Diabetes type 2 with CK D Hemoglobin A1c 5.6%  3/ HTN Blood pressure control is acceptable present Avoid ace-I or arb due to advanced CKD  We will follow along with you    LOS: 1 Donald Fernandez 3/31/201811:05 AM

## 2016-07-10 NOTE — Progress Notes (Signed)
PHARMACY NOTE:  ANTIMICROBIAL RENAL DOSAGE ADJUSTMENT  Current antimicrobial regimen includes a mismatch between antimicrobial dosage and estimated renal function.  As per policy approved by the Pharmacy & Therapeutics and Medical Executive Committees, the antimicrobial dosage will be adjusted accordingly.  Current antimicrobial dosage:  Amoxicillin 500 mg every 8 hours  Indication: ear infection  Renal Function:  Estimated Creatinine Clearance: 19.4 mL/min (A) (by C-G formula based on SCr of 7.61 mg/dL (H)). []      On intermittent HD, scheduled: []      On CRRT    Antimicrobial dosage has been changed to:  Amoxicillin 500 mg every 12 hours  Additional comments:   Thank you for allowing pharmacy to be a part of this patient's care.  Napoleon Form, [facility] 07/10/2016 12:51 PM

## 2016-07-10 NOTE — Progress Notes (Signed)
[place] [facility] [place] NAME: Donald Fernandez    MR#:  [id number]  DATE OF BIRTH:  [birth year]  SUBJECTIVE:  CHIEF COMPLAINT:   Chief Complaint  Patient presents with  . Abnormal Lab   - sent in by PCP due to abnormal labs- cr still elevated - good urine output, no complaints,   REVIEW OF SYSTEMS:  Review of Systems  Constitutional: Negative for chills, fever and malaise/fatigue.  HENT: Negative for congestion, ear discharge, hearing loss and nosebleeds.   Eyes: Negative for blurred vision and double vision.  Respiratory: Negative for cough, shortness of breath and wheezing.   Cardiovascular: Negative for chest pain, palpitations and leg swelling.  Gastrointestinal: Negative for abdominal pain, constipation, diarrhea, nausea and vomiting.  Genitourinary: Negative for dysuria.  Musculoskeletal: Negative for myalgias.  Neurological: Negative for dizziness, sensory change, speech change, focal weakness, seizures and headaches.  Psychiatric/Behavioral: Negative for depression.    DRUG ALLERGIES:  No Known Allergies  VITALS:  Blood pressure (!) 146/83, pulse 78, temperature 98.6 F (37 C), temperature source Oral, resp. rate 20, height 5\' 11"  (1.803 m), weight (!) 172.4 kg (380 lb), SpO2 99 %.  PHYSICAL EXAMINATION:  Physical Exam  GENERAL:  46 y.o.-year-old Obese patient lying in the bed with no acute distress.  EYES: Pupils equal, round, reactive to light and accommodation. No scleral icterus. Extraocular muscles intact.  HEENT: Head atraumatic, normocephalic. Oropharynx and nasopharynx clear.  NECK:  Supple, no jugular venous distention. No thyroid enlargement, no tenderness.  LUNGS: Normal breath sounds bilaterally, no wheezing, rales,rhonchi or crepitation. No use of accessory muscles of respiration. Decreased bibasilar breath sounds CARDIOVASCULAR: S1, S2 normal. No murmurs, rubs, or gallops.  ABDOMEN: Soft, nontender,  nondistended. Bowel sounds present. No organomegaly or mass.  EXTREMITIES: No pedal edema, cyanosis, or clubbing.  NEUROLOGIC: Cranial nerves II through XII are intact. Muscle strength 5/5 in all extremities. Sensation intact. Gait not checked.  PSYCHIATRIC: The patient is alert and oriented x 3.  SKIN: No obvious rash, lesion, or ulcer.    LABORATORY PANEL:   CBC  Recent Labs Lab 07/10/16 0408  WBC 5.9  HGB 10.8*  HCT 33.1*  PLT 138*   ------------------------------------------------------------------------------------------------------------------  Chemistries   Recent Labs Lab 07/09/16 0926  07/10/16 0408  NA 140  --  142  K 3.3*  --  3.4*  CL 115*  --  117*  CO2 17*  --  14*  GLUCOSE 104*  --  87  BUN 78*  --  75*  CREATININE 7.72*  < > 7.61*  CALCIUM 8.3*  --  8.4*  AST 38  --   --   ALT 46  --   --   ALKPHOS 75  --   --   BILITOT 0.4  --   --   < > = values in this interval not displayed. ------------------------------------------------------------------------------------------------------------------  Cardiac Enzymes No results for input(s): TROPONINI in the last 168 hours. ------------------------------------------------------------------------------------------------------------------  RADIOLOGY:  Ct Renal Stone Study  Result Date: 07/09/2016 CLINICAL DATA:  Elevated creatinine level EXAM: CT ABDOMEN AND PELVIS WITHOUT CONTRAST TECHNIQUE: Multidetector CT imaging of the abdomen and pelvis was performed following the standard protocol without IV contrast. COMPARISON:  None. FINDINGS: Lower chest: Bibasilar atelectasis. Hepatobiliary: Liver is unremarkable. Gallbladder is unremarkable. There is a calcification adjacent to the neck of the gallbladder of unknown significance. Pancreas: Unremarkable Spleen: Unremarkable Adrenals/Urinary Tract: Kidneys and adrenal glands are unremarkable. Bladder is unremarkable. Stomach/Bowel:  Normal appendix. No obvious mass.  Moderate stool burden throughout the ascending and transverse colon. No evidence of small-bowel obstruction. Vascular/Lymphatic: No abnormal retroperitoneal adenopathy. Reproductive: No evidence of prostate enlargement. Other: No free-fluid. Musculoskeletal: No vertebral compression deformity. Degenerative changes of the SI joints are noted. Mild lower lumbar facet arthropathy. IMPRESSION: No acute pathology. Electronically Signed   By: Marybelle Killings M.D.   On: 07/09/2016 11:06    EKG:   Orders placed or performed in visit on 12/31/11  . EKG 12-Lead    ASSESSMENT AND PLAN:   46 year old male with past medical history significant for sleep apnea, hypertension, CK D stage IV with baseline GFR of 17, gout, hypertension presents to hospital secondary to abnormal labs.  #1 acute renal failure on CK D stage IV-likely progressive kidney disease. -GFR dropped from 17 to 9 at this time. Patient is still making urine. Creatinine is greater than 7. -CT of the abdomen with normal kidneys. No obstruction noted. Normal bladder. -Appreciate nephrology consult. Possible mapping for future dialysis. No imminent need for dialysis at this time. -Follow up labs in a.m. and possible discharge if stable and outpatient follow-up  #2 hypertension-blood pressure hasn't been terribly high in the hospital. All his antihypertensives were on hold. We'll restart them at lower doses today.  #3 recent ear infection-continue ear drops and amoxicillin  #4 diabetes mellitus-sugars have been on the lower side. For now hold glipizide. On sliding scale insulin.  #5 DVT prophylaxis-on subcutaneous insulin.   All the records are reviewed and case discussed with Care Management/Social Workerr. Management plans discussed with the patient, family and they are in agreement.  CODE STATUS: Full Code  TOTAL TIME TAKING CARE OF THIS PATIENT: 38 minutes.   POSSIBLE D/C IN 1-2 DAYS, DEPENDING ON CLINICAL  CONDITION.   [name] M.D on 07/10/2016 at 10:44 AM  Between 7am to 6pm - Pager - [phone]  After 6pm go to [email or web address] - password [email or web address] [facility]  Office  [phone]  CC: Primary care physician; [place]

## 2016-07-11 LAB — BASIC METABOLIC PANEL
Anion gap: 7 (ref 5–15)
BUN: 77 mg/dL — ABNORMAL HIGH (ref 6–20)
CHLORIDE: 118 mmol/L — AB (ref 101–111)
CO2: 17 mmol/L — AB (ref 22–32)
CREATININE: 7.34 mg/dL — AB (ref 0.61–1.24)
Calcium: 8.1 mg/dL — ABNORMAL LOW (ref 8.9–10.3)
GFR calc Af Amer: 9 mL/min — ABNORMAL LOW (ref >60)
GFR calc non Af Amer: 8 mL/min — ABNORMAL LOW (ref >60)
GLUCOSE: 99 mg/dL (ref 65–99)
Potassium: 3.4 mmol/L — ABNORMAL LOW (ref 3.5–5.1)
Sodium: 142 mmol/L (ref 135–145)

## 2016-07-11 LAB — GLUCOSE, CAPILLARY: Glucose-Capillary: 107 mg/dL — ABNORMAL HIGH (ref 65–99)

## 2016-07-11 MED ORDER — CLONIDINE HCL 0.1 MG PO TABS: 0.1000 mg | ORAL_TABLET | Freq: Three times a day (TID) | ORAL | 0 refills | Status: DC

## 2016-07-11 MED ORDER — METOPROLOL SUCCINATE ER 50 MG PO TB24: 50.0000 mg | ORAL_TABLET | Freq: Every day | ORAL | 1 refills | Status: DC

## 2016-07-11 MED ORDER — GABAPENTIN 100 MG PO CAPS: 100.0000 mg | ORAL_CAPSULE | Freq: Three times a day (TID) | ORAL | 2 refills | Status: AC

## 2016-07-11 MED ORDER — GLIPIZIDE ER 2.5 MG PO TB24: 2.5000 mg | ORAL_TABLET | Freq: Every day | ORAL | 2 refills | Status: DC

## 2016-07-11 MED ORDER — AMLODIPINE BESYLATE 10 MG PO TABS
10.0000 mg | ORAL_TABLET | Freq: Every day | ORAL | Status: DC
Start: 2016-07-11 — End: 2016-07-11
  Administered 2016-07-11: 10 mg via ORAL
  Filled 2016-07-11: qty 1

## 2016-07-11 NOTE — Discharge Instructions (Addendum)
Chronic Kidney Disease, Adult Chronic kidney disease (CKD) happens when the kidneys are damaged during a time of 3 or more months. The kidneys are two organs that do many important jobs in the body. These jobs include:  Removing wastes and extra fluids from the blood.  Making hormones that maintain the amount of fluid in your tissues and blood vessels.  Making sure that the body has the right amount of fluids and chemicals.  Most of the time, this condition does not go away, but it can usually be controlled. Steps must be taken to slow down the kidney damage or stop it from getting worse. Otherwise, the kidneys may stop working. Follow these instructions at home:  Follow your diet as told by your doctor. You may need to avoid alcohol, salty foods (sodium), and foods that are high in potassium, calcium, and protein.  Take over-the-counter and prescription medicines only as told by your doctor. Do not take any new medicines unless your doctor says you can do that. These include vitamins and minerals. ? Medicines and nutritional supplements can make kidney damage worse. ? Your doctor may need to change how much medicine you take.  Do not use any tobacco products. These include cigarettes, chewing tobacco, and e-cigarettes. If you need help quitting, ask your doctor.  Keep all follow-up visits as told by your doctor. This is important.  Check your blood pressure. Tell your doctor if there are changes to your blood pressure.  Get to a healthy weight. Stay at that weight. If you need help with this, ask your doctor.  Start or continue an exercise plan. Try to exercise at least 30 minutes a day, 5 days a week.  Stay up-to-date with your shots (immunizations) as told by your doctor. Contact a doctor if:  Your symptoms get worse.  You have new symptoms. Get help right away if:  You have symptoms of end-stage kidney disease. These include: ? Headaches. ? Skin that is darker or lighter  than normal. ? Numbness in your hands or feet. ? Easy bruising. ? Having hiccups often. ? Chest pain. ? Shortness of breath. ? Stopping of menstrual periods in women.  You have a fever.  You are making very little pee (urine).  You have pain or bleeding when you pee (urinate). This information is not intended to replace advice given to you by your health care provider. Make sure you discuss any questions you have with your health care provider. Document Released: 06/23/2009 Document Revised: 09/04/2015 Document Reviewed: 11/26/2011 Elsevier Interactive Patient Education  2017 [employer] Inc[employer]  

## 2016-07-11 NOTE — Progress Notes (Signed)
Subjective:   Patient is a pleasant 46 year old African-American male with diabetes, obstructive sleep apnea, hypertension and morbid obesity. He is followed by Dr. Juleen China as outpatient for chronic kidney disease stage IV which is thought to be secondary to diabetic and hypertensive renal disease.  Labs from September 2017 show creatinine was 4.02/GFR 19 This admission, he was asked to come from home for outpatient labs that showed creatinine of 7, GFR less than 10. At present, patient denies any nausea, vomiting, shortness of breath, lower extremity edema He does not have any change in taste. Appetite has been okay. He was given IV fluids. Only marginal improvement in serum creatinine was noted. He is scheduled to be discharged today  Objective:  Vital signs in last 24 hours:  Temp:  [98.2 F (36.8 C)-98.8 F (37.1 C)] 98.4 F (36.9 C) (04/01 0821) Pulse Rate:  [73-78] 78 (04/01 0821) Resp:  [20] 20 (04/01 0821) BP: (127-159)/(80-95) 135/80 (04/01 0821) SpO2:  [99 %-100 %] 100 % (04/01 0821)  Weight change:  Filed Weights   07/09/16 0922  Weight: (!) 172.4 kg (380 lb)    Intake/Output:    Intake/Output Summary (Last 24 hours) at 07/11/16 1005 Last data filed at 07/11/16 0455  Gross per 24 hour  Intake          2566.25 ml  Output             1000 ml  Net          1566.25 ml     Physical Exam: General: Sitting up in the bed, no acute distress   HEENT Anicteric, moist oral mucous membranes   Neck Supple   Pulm/lungs Lungs are clear to auscultation, normal breathing effort   CVS/Heart Regular rate and rhythm, no rub or gallop   Abdomen:  Soft, obese, nontender, nondistended   Extremities: No peripheral edema   Neurologic: Alert, oriented   Skin: No acute rashes           Basic Metabolic Panel:   Recent Labs Lab 07/09/16 0926 07/09/16 1408 07/10/16 0408 07/11/16 0425  NA 140  --  142 142  K 3.3*  --  3.4* 3.4*  CL 115*  --  117* 118*  CO2 17*  --  14*  17*  GLUCOSE 104*  --  87 99  BUN 78*  --  75* 77*  CREATININE 7.72* 7.76* 7.61* 7.34*  CALCIUM 8.3*  --  8.4* 8.1*     CBC:  Recent Labs Lab 07/09/16 0944 07/09/16 1408 07/10/16 0408  WBC 5.5 5.4 5.9  NEUTROABS 3.8  --   --   HGB 10.7* 10.6* 10.8*  HCT 33.0* 32.7* 33.1*  MCV 86.9 87.1 87.6  PLT 145* 123* 138*      Microbiology:  Recent Results (from the past 720 hour(s))  Urine culture     Status: None   Collection Time: 07/09/16  2:08 PM  Result Value Ref Range Status   Specimen Description URINE, CLEAN CATCH  Final   Special Requests NONE  Final   Culture   Final    NO GROWTH Performed at [facility], [address] [place], [place] [address]    Report Status 07/10/2016 FINAL  Final    Coagulation Studies:  Recent Labs  07/09/16 1215  LABPROT 13.8  INR 1.06    Urinalysis:  Recent Labs  07/09/16 [place] 1.009  PHURINE 6.0  GLUCOSEU NEGATIVE  HGBUR NEGATIVE  BILIRUBINUR  NEGATIVE  KETONESUR NEGATIVE  PROTEINUR 100*  NITRITE NEGATIVE  LEUKOCYTESUR TRACE*      Imaging: Ct Renal Stone Study  Result Date: 07/09/2016 CLINICAL DATA:  Elevated creatinine level EXAM: CT ABDOMEN AND PELVIS WITHOUT CONTRAST TECHNIQUE: Multidetector CT imaging of the abdomen and pelvis was performed following the standard protocol without IV contrast. COMPARISON:  None. FINDINGS: Lower chest: Bibasilar atelectasis. Hepatobiliary: Liver is unremarkable. Gallbladder is unremarkable. There is a calcification adjacent to the neck of the gallbladder of unknown significance. Pancreas: Unremarkable Spleen: Unremarkable Adrenals/Urinary Tract: Kidneys and adrenal glands are unremarkable. Bladder is unremarkable. Stomach/Bowel: Normal appendix. No obvious mass. Moderate stool burden throughout the ascending and transverse colon. No evidence of small-bowel obstruction. Vascular/Lymphatic: No abnormal retroperitoneal adenopathy. Reproductive: No  evidence of prostate enlargement. Other: No free-fluid. Musculoskeletal: No vertebral compression deformity. Degenerative changes of the SI joints are noted. Mild lower lumbar facet arthropathy. IMPRESSION: No acute pathology. Electronically Signed   By: Marybelle Killings M.D.   On: 07/09/2016 11:06     Medications:    . amLODipine  10 mg Oral Daily  . amoxicillin  500 mg Oral Q12H  . ciprofloxacin-dexamethasone  4 drop Right Ear BID  . cloNIDine  0.1 mg Oral BID  . docusate sodium  100 mg Oral BID  . gabapentin  100 mg Oral TID  . heparin  5,000 Units Subcutaneous Q8H  . insulin aspart  0-5 Units Subcutaneous QHS  . insulin aspart  0-9 Units Subcutaneous TID WC  . insulin aspart  3 Units Subcutaneous TID WC  . metoprolol succinate  50 mg Oral Daily  . senna  1 tablet Oral BID  . sodium chloride flush  3 mL Intravenous Q12H  . tamsulosin  0.4 mg Oral Daily  . traZODone  100 mg Oral QHS   acetaminophen **OR** acetaminophen, nicotine, ondansetron **OR** ondansetron (ZOFRAN) IV  Assessment/ Plan:  46 y.o. African-American male With long-standing diabetes type 2, obstructive sleep apnea, morbid obesity, hypertension, hyperlipidemia, BPH, gout, chronic kidney disease stage IV  1. Chronic kidney disease stage V versus acute on chronic kidney disease stage IV Most likely patient's renal disease has progressed to chronic kidney disease stage V He does not have any uremic symptoms at present. However, we had discussion about dialysis in near future. We discussed different options including home peritoneal dialysis in center hemodialysis. He does not have any access at present.  - No acute indication for dialysis at present - We will follow-up with him as outpatient. Once the office opens tomorrow, we will contact him with a follow-up appointment - cell # [phone]  2. Diabetes type 2 with CK D Hemoglobin A1c 5.6%  3/ HTN Blood pressure control is acceptable present Avoid ace-I or arb due  to advanced CKD      LOS: 2 Donald Fernandez 4/1/201810:05 AM

## 2016-07-11 NOTE — Plan of Care (Signed)
MD making rounds. Received order to discharge home. IV removed. Patient provided with Education Handout. Prescriptions E-scribed to pharmacy. Discharge paperwork provided, explained, signed and witnessed. No questions left unanswered. Discharged via wheelchair by nursing staff. Belongings sent with patient and family.

## 2016-07-11 NOTE — Discharge Summary (Signed)
[place] [facility] [place] NAME: Donald Fernandez    MR#:  [id number]  DATE OF BIRTH:  09-08-1970  DATE OF ADMISSION:  07/09/2016   ADMITTING PHYSICIAN: Theodoro Grist, MD  DATE OF DISCHARGE:  07/11/2016  PRIMARY CARE PHYSICIAN: [place]   ADMISSION DIAGNOSIS:   Renal failure [N19] Acute renal failure, unspecified acute renal failure type ([place]) [N17.9]  DISCHARGE DIAGNOSIS:   Active Problems:   Acute on chronic renal failure ([place])   SECONDARY DIAGNOSIS:   Past Medical History:  Diagnosis Date  . Diabetes mellitus without complication ([place])   . Hypertension   . Renal disorder    CKD4    HOSPITAL COURSE:   46 year old male with past medical history significant for sleep apnea, hypertension, CK D stage IV with baseline GFR of 17, gout, hypertension presents to hospital secondary to abnormal labs.  #1 acute renal failure on CKD stage IV-likely progressive kidney disease to CKD stage 5 -GFR dropped from 17 to 9 at this time. Patient is still making urine. Creatinine is greater than 7. - No uremic symptoms -CT of the abdomen with normal kidneys. No obstruction noted. Normal bladder. -Appreciate nephrology consult. Possible mapping for future dialysis. No imminent need for dialysis at this time. -outpatient follow-up with vascular for creating dialysis access and nephrology recommended  #2 hypertension-blood pressure hasn't been terribly high in the hospital. Only some of his BP meds are added- norvasc, clonidine, lasix and metorpolol adjusted dose are added back. Hold hydralazine and ARB  #3 recent ear infection-continue ear drops and amoxicillin for 1 week total  #4 diabetes mellitus-sugars have been on the lower side. Discharge on half dose of glipizide and outpatient follow up A1c is only 5.6  #5 OSA- weight loss recommended and also wears CPAP at bedtime  Patient feels ready for discharge  today  DISCHARGE CONDITIONS:   Guarded  CONSULTS OBTAINED:   Treatment Team:  Murlean Iba, MD Lavonia Dana, MD  DRUG ALLERGIES:   No Known Allergies DISCHARGE MEDICATIONS:   Allergies as of 07/11/2016   No Known Allergies     Medication List    STOP taking these medications   acetaZOLAMIDE 250 MG tablet Commonly known as:  DIAMOX   hydrALAZINE 100 MG tablet Commonly known as:  APRESOLINE   HYDROcodone-acetaminophen 5-325 MG tablet Commonly known as:  NORCO   losartan 50 MG tablet Commonly known as:  COZAAR     TAKE these medications   allopurinol 100 MG tablet Commonly known as:  ZYLOPRIM Take 100 mg by mouth daily.   amLODipine 10 MG tablet Commonly known as:  NORVASC Take 10 mg by mouth daily.   amoxicillin 500 MG capsule Commonly known as:  AMOXIL Take 500 mg by mouth 3 (three) times daily.   aspirin EC 81 MG tablet Take 81 mg by mouth daily.   atorvastatin 20 MG tablet Commonly known as:  LIPITOR Take 20 mg by mouth daily.   ciprofloxacin-dexamethasone otic suspension Commonly known as:  CIPRODEX 4 drops 2 (two) times daily.   cloNIDine 0.1 MG tablet Commonly known as:  CATAPRES Take 1 tablet (0.1 mg total) by mouth 3 (three) times daily. What changed:  medication strength  how much to take   doxazosin 8 MG tablet Commonly known as:  CARDURA Take 8 mg by mouth at bedtime.   furosemide 40 MG tablet Commonly known as:  LASIX Take 40 mg by mouth.   gabapentin 100  MG capsule Commonly known as:  NEURONTIN Take 1 capsule (100 mg total) by mouth 3 (three) times daily. What changed:  medication strength  how much to take  when to take this   glipiZIDE 2.5 MG 24 hr tablet Commonly known as:  GLUCOTROL XL Take 1 tablet (2.5 mg total) by mouth daily with breakfast. What changed:  medication strength  how much to take   metoprolol succinate 50 MG 24 hr tablet Commonly known as:  TOPROL-XL Take 1 tablet (50 mg total) by  mouth daily. What changed:  medication strength  how much to take   tamsulosin 0.4 MG Caps capsule Commonly known as:  FLOMAX Take 0.4 mg by mouth.   traZODone 100 MG tablet Commonly known as:  DESYREL Take 100 mg by mouth at bedtime.   VITAMIN D PO Take 5,000 Units by mouth daily.   vitamin E 400 UNIT capsule Take 400 Units by mouth daily.        DISCHARGE INSTRUCTIONS:   1. PCP f/u in 1-2 weeks  DIET:   Cardiac diet and Renal diet  ACTIVITY:   Activity as tolerated  OXYGEN:   Home Oxygen: No.  Oxygen Delivery: room air  DISCHARGE LOCATION:   home   If you experience worsening of your admission symptoms, develop shortness of breath, life threatening emergency, suicidal or homicidal thoughts you must seek medical attention immediately by calling 911 or calling your MD immediately  if symptoms less severe.  You Must read complete instructions/literature along with all the possible adverse reactions/side effects for all the Medicines you take and that have been prescribed to you. Take any new Medicines after you have completely understood and accpet all the possible adverse reactions/side effects.   Please note  You were cared for by a hospitalist during your hospital stay. If you have any questions about your discharge medications or the care you received while you were in the hospital after you are discharged, you can call the unit and asked to speak with the hospitalist on call if the hospitalist that took care of you is not available. Once you are discharged, your primary care physician will handle any further medical issues. Please note that NO REFILLS for any discharge medications will be authorized once you are discharged, as it is imperative that you return to your primary care physician (or establish a relationship with a primary care physician if you do not have one) for your aftercare needs so that they can reassess your need for medications and monitor  your lab values.    On the day of Discharge:  VITAL SIGNS:   Blood pressure 135/80, pulse 78, temperature 98.4 F (36.9 C), temperature source Oral, resp. rate 20, height 5\' 11"  (1.803 m), weight (!) 172.4 kg (380 lb), SpO2 100 %.  PHYSICAL EXAMINATION:   GENERAL:  46 y.o.-year-old Obese patient lying in the bed with no acute distress.  EYES: Pupils equal, round, reactive to light and accommodation. No scleral icterus. Extraocular muscles intact.  HEENT: Head atraumatic, normocephalic. Oropharynx and nasopharynx clear.  NECK:  Supple, no jugular venous distention. No thyroid enlargement, no tenderness.  LUNGS: Normal breath sounds bilaterally, no wheezing, rales,rhonchi or crepitation. No use of accessory muscles of respiration. Decreased bibasilar breath sounds CARDIOVASCULAR: S1, S2 normal. No murmurs, rubs, or gallops.  ABDOMEN: Soft, nontender, nondistended. Bowel sounds present. No organomegaly or mass.  EXTREMITIES: No pedal edema, cyanosis, or clubbing.  NEUROLOGIC: Cranial nerves II through XII are intact. Muscle strength  5/5 in all extremities. Sensation intact. Gait not checked.  PSYCHIATRIC: The patient is alert and oriented x 3.  SKIN: No obvious rash, lesion, or ulcer.   DATA REVIEW:   CBC  Recent Labs Lab 07/10/16 0408  WBC 5.9  HGB 10.8*  HCT 33.1*  PLT 138*    Chemistries   Recent Labs Lab 07/09/16 0926  07/11/16 0425  NA 140  < > 142  K 3.3*  < > 3.4*  CL 115*  < > 118*  CO2 17*  < > 17*  GLUCOSE 104*  < > 99  BUN 78*  < > 77*  CREATININE 7.72*  < > 7.34*  CALCIUM 8.3*  < > 8.1*  AST 38  --   --   ALT 46  --   --   ALKPHOS 75  --   --   BILITOT 0.4  --   --   < > = values in this interval not displayed.   Microbiology Results  Results for orders placed or performed during the hospital encounter of 07/09/16  Urine culture     Status: None   Collection Time: 07/09/16  2:08 PM  Result Value Ref Range Status   Specimen Description URINE,  CLEAN CATCH  Final   Special Requests NONE  Final   Culture   Final    NO GROWTH Performed at [facility], [place] [address], [place], [place] [address]    Report Status 07/10/2016 FINAL  Final    RADIOLOGY:  No results found.   Management plans discussed with the patient, family and they are in agreement.  CODE STATUS:     Code Status Orders        Start     Ordered   07/09/16 1311  Full code  Continuous     07/09/16 1310    Code Status History    Date Active Date Inactive Code Status Order ID Comments User Context   This patient has a current code status but no historical code status.      TOTAL TIME TAKING CARE OF THIS PATIENT: 38 minutes.    [name] M.D on 07/11/2016 at 11:39 AM  Between 7am to 6pm - Pager - [phone]  After 6pm go to [email or web address] - [employer] [facility]  Office  [phone]  CC: Primary care physician; [place]   Note: This dictation was prepared with Dragon dictation along with smaller phrase technology. Any transcriptional errors that result from this process are unintentional.

## 2016-07-11 NOTE — Progress Notes (Signed)
[name] NAME: Donald Fernandez    MR#:  [id number]  DATE OF BIRTH:  Apr 14, 1970  SUBJECTIVE:  CHIEF COMPLAINT:   Chief Complaint  Patient presents with  . Abnormal Lab   - Feels about the same. Blood pressure is slightly elevated today. Sugars are within normal limits. -Good urine output. Creatinine is about the same.  REVIEW OF SYSTEMS:  Review of Systems  Constitutional: Negative for chills, fever and malaise/fatigue.  HENT: Negative for congestion, ear discharge, hearing loss and nosebleeds.   Eyes: Negative for blurred vision and double vision.  Respiratory: Negative for cough, shortness of breath and wheezing.   Cardiovascular: Negative for chest pain, palpitations and leg swelling.  Gastrointestinal: Negative for abdominal pain, constipation, diarrhea, nausea and vomiting.  Genitourinary: Negative for dysuria.  Musculoskeletal: Negative for myalgias.  Neurological: Negative for dizziness, sensory change, speech change, focal weakness, seizures and headaches.  Psychiatric/Behavioral: Negative for depression.    DRUG ALLERGIES:  No Known Allergies  VITALS:  Blood pressure 135/80, pulse 78, temperature 98.4 F (36.9 C), temperature source Oral, resp. rate 20, height 5\' 11"  (1.803 m), weight (!) 172.4 kg (380 lb), SpO2 100 %.  PHYSICAL EXAMINATION:  Physical Exam  GENERAL:  46 y.o.-year-old Obese patient lying in the bed with no acute distress.  EYES: Pupils equal, round, reactive to light and accommodation. No scleral icterus. Extraocular muscles intact.  HEENT: Head atraumatic, normocephalic. Oropharynx and nasopharynx clear.  NECK:  Supple, no jugular venous distention. No thyroid enlargement, no tenderness.  LUNGS: Normal breath sounds bilaterally, no wheezing, rales,rhonchi or crepitation. No use of accessory muscles of respiration. Decreased bibasilar breath sounds CARDIOVASCULAR: S1, S2 normal. No murmurs,  rubs, or gallops.  ABDOMEN: Soft, nontender, nondistended. Bowel sounds present. No organomegaly or mass.  EXTREMITIES: No pedal edema, cyanosis, or clubbing.  NEUROLOGIC: Cranial nerves II through XII are intact. Muscle strength 5/5 in all extremities. Sensation intact. Gait not checked.  PSYCHIATRIC: The patient is alert and oriented x 3.  SKIN: No obvious rash, lesion, or ulcer.    LABORATORY PANEL:   CBC  Recent Labs Lab 07/10/16 0408  WBC 5.9  HGB 10.8*  HCT 33.1*  PLT 138*   ------------------------------------------------------------------------------------------------------------------  Chemistries   Recent Labs Lab 07/09/16 0926  07/11/16 0425  NA 140  < > 142  K 3.3*  < > 3.4*  CL 115*  < > 118*  CO2 17*  < > 17*  GLUCOSE 104*  < > 99  BUN 78*  < > 77*  CREATININE 7.72*  < > 7.34*  CALCIUM 8.3*  < > 8.1*  AST 38  --   --   ALT 46  --   --   ALKPHOS 75  --   --   BILITOT 0.4  --   --   < > = values in this interval not displayed. ------------------------------------------------------------------------------------------------------------------  Cardiac Enzymes No results for input(s): TROPONINI in the last 168 hours. ------------------------------------------------------------------------------------------------------------------  RADIOLOGY:  Ct Renal Stone Study  Result Date: 07/09/2016 CLINICAL DATA:  Elevated creatinine level EXAM: CT ABDOMEN AND PELVIS WITHOUT CONTRAST TECHNIQUE: Multidetector CT imaging of the abdomen and pelvis was performed following the standard protocol without IV contrast. COMPARISON:  None. FINDINGS: Lower chest: Bibasilar atelectasis. Hepatobiliary: Liver is unremarkable. Gallbladder is unremarkable. There is a calcification adjacent to the neck of the gallbladder of unknown significance. Pancreas: Unremarkable Spleen: Unremarkable Adrenals/Urinary Tract: Kidneys and adrenal glands are unremarkable.  Bladder is unremarkable.  Stomach/Bowel: Normal appendix. No obvious mass. Moderate stool burden throughout the ascending and transverse colon. No evidence of small-bowel obstruction. Vascular/Lymphatic: No abnormal retroperitoneal adenopathy. Reproductive: No evidence of prostate enlargement. Other: No free-fluid. Musculoskeletal: No vertebral compression deformity. Degenerative changes of the SI joints are noted. Mild lower lumbar facet arthropathy. IMPRESSION: No acute pathology. Electronically Signed   By: Marybelle Killings M.D.   On: 07/09/2016 11:06    EKG:   Orders placed or performed in visit on 12/31/11  . EKG 12-Lead    ASSESSMENT AND PLAN:   46 year old male with past medical history significant for sleep apnea, hypertension, CK D stage IV with baseline GFR of 17, gout, hypertension presents to hospital secondary to abnormal labs.  #1 acute renal failure on CKD stage IV-likely progressive kidney disease. -GFR dropped from 17 to 9 at this time. Patient is still making urine. Creatinine is greater than 7. -CT of the abdomen with normal kidneys. No obstruction noted. Normal bladder. -Appreciate nephrology consult. Possible mapping for future dialysis. No imminent need for dialysis at this time. -possible discharge today and outpatient follow-up  #2 hypertension-blood pressure hasn't been terribly high in the hospital. Only on 2 of his BP meds- adjust prior to discharge  #3 recent ear infection-continue ear drops and amoxicillin  #4 diabetes mellitus-sugars have been on the lower side. Discharge on half dose of glipizide and outpatient follow up  #5 DVT prophylaxis-on subcutaneous insulin.   All the records are reviewed and case discussed with Care Management/Social Workerr. Management plans discussed with the patient, family and they are in agreement.  CODE STATUS: Full Code  TOTAL TIME TAKING CARE OF THIS PATIENT: 38 minutes.   POSSIBLE D/C TODAY, DEPENDING ON CLINICAL CONDITION.   Donald Fernandez  M.D on 07/11/2016 at 8:29 AM  Between 7am to 6pm - Pager - [phone]  After 6pm go to [email or web address] - password [email or web address] [facility]  Office  [phone]  CC: Primary care physician; [place]

## 2016-07-20 DIAGNOSIS — H4710 Unspecified papilledema: Secondary | ICD-10-CM | POA: Diagnosis not present

## 2016-07-22 ENCOUNTER — Other Ambulatory Visit (INDEPENDENT_AMBULATORY_CARE_PROVIDER_SITE_OTHER): Payer: PPO

## 2016-07-22 ENCOUNTER — Ambulatory Visit (INDEPENDENT_AMBULATORY_CARE_PROVIDER_SITE_OTHER): Payer: PPO | Admitting: Vascular Surgery

## 2016-07-22 ENCOUNTER — Encounter (INDEPENDENT_AMBULATORY_CARE_PROVIDER_SITE_OTHER): Payer: Self-pay | Admitting: Vascular Surgery

## 2016-07-22 ENCOUNTER — Other Ambulatory Visit (INDEPENDENT_AMBULATORY_CARE_PROVIDER_SITE_OTHER): Payer: Self-pay | Admitting: Vascular Surgery

## 2016-07-22 VITALS — BP 186/118 | HR 76 | Resp 17 | Ht 71.0 in | Wt 377.0 lb

## 2016-07-22 DIAGNOSIS — I10 Essential (primary) hypertension: Secondary | ICD-10-CM

## 2016-07-22 DIAGNOSIS — Z01810 Encounter for preprocedural cardiovascular examination: Secondary | ICD-10-CM

## 2016-07-22 DIAGNOSIS — E1169 Type 2 diabetes mellitus with other specified complication: Secondary | ICD-10-CM

## 2016-07-22 DIAGNOSIS — N189 Chronic kidney disease, unspecified: Secondary | ICD-10-CM

## 2016-07-22 DIAGNOSIS — N184 Chronic kidney disease, stage 4 (severe): Secondary | ICD-10-CM

## 2016-07-22 DIAGNOSIS — E669 Obesity, unspecified: Secondary | ICD-10-CM | POA: Diagnosis not present

## 2016-07-22 DIAGNOSIS — N179 Acute kidney failure, unspecified: Secondary | ICD-10-CM

## 2016-07-23 DIAGNOSIS — G4733 Obstructive sleep apnea (adult) (pediatric): Secondary | ICD-10-CM | POA: Diagnosis not present

## 2016-07-26 ENCOUNTER — Encounter (INDEPENDENT_AMBULATORY_CARE_PROVIDER_SITE_OTHER): Payer: Self-pay

## 2016-07-27 ENCOUNTER — Other Ambulatory Visit (INDEPENDENT_AMBULATORY_CARE_PROVIDER_SITE_OTHER): Payer: Self-pay | Admitting: Vascular Surgery

## 2016-07-27 DIAGNOSIS — N189 Chronic kidney disease, unspecified: Principal | ICD-10-CM

## 2016-07-27 DIAGNOSIS — N179 Acute kidney failure, unspecified: Secondary | ICD-10-CM

## 2016-07-28 DIAGNOSIS — Z01810 Encounter for preprocedural cardiovascular examination: Secondary | ICD-10-CM | POA: Diagnosis not present

## 2016-07-28 DIAGNOSIS — N185 Chronic kidney disease, stage 5: Secondary | ICD-10-CM | POA: Diagnosis not present

## 2016-07-28 DIAGNOSIS — Z01812 Encounter for preprocedural laboratory examination: Secondary | ICD-10-CM | POA: Diagnosis not present

## 2016-07-28 DIAGNOSIS — I10 Essential (primary) hypertension: Secondary | ICD-10-CM | POA: Diagnosis not present

## 2016-07-28 DIAGNOSIS — H4710 Unspecified papilledema: Secondary | ICD-10-CM | POA: Diagnosis not present

## 2016-07-29 ENCOUNTER — Encounter
Admission: RE | Admit: 2016-07-29 | Discharge: 2016-07-29 | Disposition: A | Discharge status: Home / Self Care | Payer: PPO | Source: Ambulatory Visit | Attending: Vascular Surgery | Admitting: Vascular Surgery

## 2016-07-29 DIAGNOSIS — Z01812 Encounter for preprocedural laboratory examination: Secondary | ICD-10-CM | POA: Insufficient documentation

## 2016-07-29 DIAGNOSIS — Z01810 Encounter for preprocedural cardiovascular examination: Secondary | ICD-10-CM | POA: Insufficient documentation

## 2016-07-29 HISTORY — DX: Unspecified asthma, uncomplicated: J45.909

## 2016-07-29 LAB — CBC WITH DIFFERENTIAL/PLATELET
Basophils Absolute: 0 10*3/uL (ref 0–0.1)
Basophils Relative: 1 %
Eosinophils Absolute: 0.2 10*3/uL (ref 0–0.7)
Eosinophils Relative: 3 %
HEMATOCRIT: 32.3 % — AB (ref 40.0–52.0)
Hemoglobin: 10.7 g/dL — ABNORMAL LOW (ref 13.0–18.0)
LYMPHS PCT: 17 %
Lymphs Abs: 1.1 10*3/uL (ref 1.0–3.6)
MCH: 28 pg (ref 26.0–34.0)
MCHC: 33.3 g/dL (ref 32.0–36.0)
MCV: 84.2 fL (ref 80.0–100.0)
MONO ABS: 0.6 10*3/uL (ref 0.2–1.0)
MONOS PCT: 9 %
NEUTROS ABS: 4.5 10*3/uL (ref 1.4–6.5)
Neutrophils Relative %: 70 %
Platelets: 168 10*3/uL (ref 150–440)
RBC: 3.83 MIL/uL — ABNORMAL LOW (ref 4.40–5.90)
RDW: 16.1 % — AB (ref 11.5–14.5)
WBC: 6.5 10*3/uL (ref 3.8–10.6)

## 2016-07-29 LAB — BASIC METABOLIC PANEL
ANION GAP: 9 (ref 5–15)
BUN: 53 mg/dL — ABNORMAL HIGH (ref 6–20)
CHLORIDE: 109 mmol/L (ref 101–111)
CO2: 23 mmol/L (ref 22–32)
Calcium: 8.2 mg/dL — ABNORMAL LOW (ref 8.9–10.3)
Creatinine, Ser: 7.22 mg/dL — ABNORMAL HIGH (ref 0.61–1.24)
GFR calc non Af Amer: 8 mL/min — ABNORMAL LOW (ref >60)
GFR, EST AFRICAN AMERICAN: 9 mL/min — AB (ref >60)
GLUCOSE: 61 mg/dL — AB (ref 65–99)
Potassium: 3.3 mmol/L — ABNORMAL LOW (ref 3.5–5.1)
SODIUM: 141 mmol/L (ref 135–145)

## 2016-07-29 LAB — TYPE AND SCREEN
ABO/RH(D): A POS
Antibody Screen: NEGATIVE

## 2016-07-29 LAB — PROTIME-INR
INR: 0.99
Prothrombin Time: 13.1 seconds (ref 11.4–15.2)

## 2016-07-29 LAB — APTT: aPTT: 28 seconds (ref 24–36)

## 2016-07-29 NOTE — Pre-Procedure Instructions (Signed)
EKG COMPARED WITH 2013 

## 2016-07-29 NOTE — Pre-Procedure Instructions (Addendum)
Ginger Carne RN reviewed today's EKG compared to prior EKG, OK to proceed.  Unable to find an H+P in Epic, spoke to April at Dr. Nino Parsley office, the notes from pt's last office visit still needs to be completed.

## 2016-07-29 NOTE — Patient Instructions (Signed)
  Your procedure is scheduled on:Wednesday August 04, 2016. Report to Same Day Surgery. To find out your arrival time please call [phone] between 1PM - 3PM on Tuesday August 03, 2016.  Remember: Instructions that are not followed completely may result in serious medical risk, up to and including death, or upon the discretion of your surgeon and anesthesiologist your surgery may need to be rescheduled.    _x___ 1. Do not eat food or drink liquids after midnight. No gum chewing or hard candies.     ____ 2. No Alcohol for 24 hours before or after surgery.   ____ 3. Bring all medications with you on the day of surgery if instructed.    __x__ 4. Notify your doctor if there is any change in your medical condition     (cold, fever, infections).    __x___ 5. No smoking 24 hours prior to surgery.     Do not wear jewelry, make-up, hairpins, clips or nail polish.  Do not wear lotions, powders, or perfumes.   Do not shave 48 hours prior to surgery. Men may shave face and neck.  Do not bring valuables to the hospital.    [facility] is not responsible for any belongings or valuables.               Contacts, dentures or bridgework may not be worn into surgery.  Leave your suitcase in the car. After surgery it may be brought to your room.  For patients admitted to the hospital, discharge time is determined by your treatment team.   Patients discharged the day of surgery will not be allowed to drive home.    Please read over the following fact sheets that you were given:   [facility] Preparing for Surgery  _x___ Take these medicines the morning of surgery with A SIP OF WATER:    1. amLODipine (NORVASC)   2. atorvastatin (LIPITOR)  3. cloNIDine (CATAPRES)   4. gabapentin (NEURONTIN)  5. metoprolol (TOPROL-XL)    ____ Fleet Enema (as directed)   ____ Use CHG Soap as directed on instruction sheet  ____ Use inhalers on the day of surgery and bring to hospital day of surgery  ____  Stop metformin 2 days prior to surgery    ____ Take 1/2 of usual insulin dose the night before surgery and none on the morning of surgery.   ____ Stop Coumadin/Plavix/aspirin on does not apply.  ____ Stop Anti-inflammatories such as Advil, Aleve, Ibuprofen, Motrin, Naproxen,  Naprosyn, Goodies powders or aspirin products. OK to take Tylenol.   _x___ Stop supplements: vitamin E until after surgery.    _x___ Bring C-Pap to the hospital.

## 2016-07-29 NOTE — Pre-Procedure Instructions (Signed)
Met B and CBC sent to Dr. Delana Meyer and Anesthesia for review.  Also requested H&P.

## 2016-07-30 ENCOUNTER — Inpatient Hospital Stay: Admission: RE | Admit: 2016-07-30 | Payer: PPO | Source: Ambulatory Visit

## 2016-07-30 DIAGNOSIS — N184 Chronic kidney disease, stage 4 (severe): Secondary | ICD-10-CM | POA: Diagnosis not present

## 2016-07-30 DIAGNOSIS — I10 Essential (primary) hypertension: Secondary | ICD-10-CM | POA: Diagnosis not present

## 2016-07-30 DIAGNOSIS — E119 Type 2 diabetes mellitus without complications: Secondary | ICD-10-CM | POA: Diagnosis not present

## 2016-07-30 DIAGNOSIS — H60501 Unspecified acute noninfective otitis externa, right ear: Secondary | ICD-10-CM | POA: Diagnosis not present

## 2016-07-30 DIAGNOSIS — R7309 Other abnormal glucose: Secondary | ICD-10-CM | POA: Diagnosis not present

## 2016-07-30 DIAGNOSIS — H4710 Unspecified papilledema: Secondary | ICD-10-CM | POA: Insufficient documentation

## 2016-07-30 DIAGNOSIS — Z1389 Encounter for screening for other disorder: Secondary | ICD-10-CM | POA: Diagnosis not present

## 2016-07-30 DIAGNOSIS — H47093 Other disorders of optic nerve, not elsewhere classified, bilateral: Secondary | ICD-10-CM | POA: Diagnosis not present

## 2016-07-30 LAB — SURGICAL PCR SCREEN
MRSA, PCR: POSITIVE — AB
STAPHYLOCOCCUS AUREUS: POSITIVE — AB

## 2016-07-30 NOTE — Pre-Procedure Instructions (Signed)
Positive MRSA and STAPH results faxed and called to office with a message left on nurse line.

## 2016-08-02 ENCOUNTER — Other Ambulatory Visit: Payer: Self-pay | Admitting: Neurology

## 2016-08-02 DIAGNOSIS — N186 End stage renal disease: Secondary | ICD-10-CM | POA: Insufficient documentation

## 2016-08-02 DIAGNOSIS — H4710 Unspecified papilledema: Secondary | ICD-10-CM

## 2016-08-02 DIAGNOSIS — E1122 Type 2 diabetes mellitus with diabetic chronic kidney disease: Secondary | ICD-10-CM | POA: Insufficient documentation

## 2016-08-02 DIAGNOSIS — E1169 Type 2 diabetes mellitus with other specified complication: Secondary | ICD-10-CM | POA: Insufficient documentation

## 2016-08-02 DIAGNOSIS — I120 Hypertensive chronic kidney disease with stage 5 chronic kidney disease or end stage renal disease: Secondary | ICD-10-CM | POA: Insufficient documentation

## 2016-08-02 DIAGNOSIS — I10 Essential (primary) hypertension: Secondary | ICD-10-CM | POA: Insufficient documentation

## 2016-08-02 DIAGNOSIS — E669 Obesity, unspecified: Secondary | ICD-10-CM

## 2016-08-02 NOTE — Progress Notes (Signed)
Subjective:    Patient ID: Donald Fernandez, male    DOB: July 02, 1970, 46 y.o.   MRN: [id number] Chief Complaint  Patient presents with  . Re-evaluation    Vein mapping   Patient presents to discuss dialysis access placement. He has chronic kidney disease and will need dialysis in the future. He is right hand dominant. He denies any fever, nausea or vomiting. Vein mapping was amenable for creation of a left radial cephalic AV fistula creation.    Review of Systems  Constitutional: Negative.   HENT: Negative.   Eyes: Negative.   Respiratory: Negative.   Cardiovascular: Negative.   Gastrointestinal: Negative.   Endocrine: Negative.   Genitourinary:       ESRD  Musculoskeletal: Negative.   Skin: Negative.   Allergic/Immunologic: Negative.   Neurological: Negative.   Hematological: Negative.   Psychiatric/Behavioral: Negative.       Objective:   Physical Exam  Constitutional: He is oriented to person, place, and time. He appears well-developed and well-nourished. No distress.  HENT:  Head: Normocephalic and atraumatic.  Right Ear: External ear normal.  Left Ear: External ear normal.  Eyes: Conjunctivae are normal. Pupils are equal, round, and reactive to light.  Neck: Normal range of motion.  Cardiovascular: Normal rate, regular rhythm, normal heart sounds and intact distal pulses.   Pulses:      Radial pulses are 2+ on the right side, and 2+ on the left side.  Pulmonary/Chest: Effort normal.  Musculoskeletal: Normal range of motion. He exhibits no edema.  Neurological: He is alert and oriented to person, place, and time.  Skin: Skin is warm and dry. He is not diaphoretic.  Psychiatric: He has a normal mood and affect. His behavior is normal. Judgment and thought content normal.  Vitals reviewed.  BP (!) 186/118 (BP Location: Left Arm)   Pulse 76   Resp 17   Ht 5\' 11"  (1.803 m)   Wt (!) 377 lb (171 kg)   BMI 52.58 kg/m   Past Medical History:  Diagnosis Date  .  Asthma    triggered by enviornmental allergies  . Diabetes mellitus without complication ([place])   . Hypertension   . Renal disorder    CKD4   Social History   Social History  . Marital status: Single    Spouse name: N/A  . Number of children: N/A  . Years of education: N/A   Occupational History  . Not on file.   Social History Main Topics  . Smoking status: Current Every Day Smoker    Types: Cigarettes  . Smokeless tobacco: Never Used     Comment: pt refusept says 1 pack of cigarette lasts him 1-2 weeks  . Alcohol use Yes     Comment: a few beers per year.  . Drug use: No  . Sexual activity: Yes    Birth control/ protection: Condom   Other Topics Concern  . Not on file   Social History Narrative  . No narrative on file   Past Surgical History:  Procedure Laterality Date  . ANKLE SURGERY     No family history on file.  No Known Allergies     Assessment & Plan:  Patient presents to discuss dialysis access placement. He has chronic kidney disease and will need dialysis in the future. He is right hand dominant. He denies any fever, nausea or vomiting. Vein mapping was amenable for creation of a left radial cephalic AV fistula creation.   1. Acute  renal failure superimposed on chronic kidney disease, unspecified CKD stage, unspecified acute renal failure type ([place]) - Stable Patient with failing kidney function. Will need dialysis in the future. Vein mapping shows creation of left radiocephalic AV fistula creation. He knows it will take at least six weeks to mature. Procedure, risks and benefits explained to patient.  All questions answered. Patient wishes to proceed.   2. Essential hypertension - Stable Encouraged good control as its slows the progression of atherosclerotic disease  3. Diabetes mellitus type 2 in obese (HCC) - Stable Encouraged good control as its slows the progression of atherosclerotic disease  Current Outpatient Prescriptions on File Prior  to Visit  Medication Sig Dispense Refill  . allopurinol (ZYLOPRIM) 100 MG tablet Take 100 mg by mouth daily.    Marland Kitchen amLODipine (NORVASC) 10 MG tablet Take 10 mg by mouth daily.    Marland Kitchen aspirin EC 81 MG tablet Take 81 mg by mouth daily.    Marland Kitchen atorvastatin (LIPITOR) 20 MG tablet Take 20 mg by mouth daily.    . Cholecalciferol (VITAMIN D PO) Take 5,000 Units by mouth daily.    . cloNIDine (CATAPRES) 0.1 MG tablet Take 1 tablet (0.1 mg total) by mouth 3 (three) times daily. 90 tablet 0  . doxazosin (CARDURA) 8 MG tablet Take 8 mg by mouth at bedtime.    . furosemide (LASIX) 40 MG tablet Take 40 mg by mouth daily.     Marland Kitchen gabapentin (NEURONTIN) 100 MG capsule Take 1 capsule (100 mg total) by mouth 3 (three) times daily. 90 capsule 2  . glipiZIDE (GLUCOTROL XL) 2.5 MG 24 hr tablet Take 1 tablet (2.5 mg total) by mouth daily with breakfast. 30 tablet 2  . metoprolol (TOPROL-XL) 50 MG 24 hr tablet Take 1 tablet (50 mg total) by mouth daily. 30 tablet 1  . tamsulosin (FLOMAX) 0.4 MG CAPS capsule Take 0.4 mg by mouth daily after breakfast.     . traZODone (DESYREL) 100 MG tablet Take 100 mg by mouth at bedtime.    . vitamin E 400 UNIT capsule Take 400 Units by mouth daily.     No current facility-administered medications on file prior to visit.     There are no Patient Instructions on file for this visit. No Follow-up on file.   Esparanza Krider A Starla Deller, PA-C

## 2016-08-02 NOTE — Pre-Procedure Instructions (Signed)
Spoke with Mickel Baas at Dr. Nino Parsley office regarding nasal swab results: MRSA positive.  Vancomycin 1 gram IVPB ordered.  Also asked if H+P had been finished from 07/22/16 office visit.  Mickel Baas will remind Dr. Delana Meyer or Hezzie Bump need for H+P for surgery on 08/04/16.

## 2016-08-03 MED ORDER — VANCOMYCIN HCL IN DEXTROSE 1-5 GM/200ML-% IV SOLN
1000.0000 mg | Freq: Once | INTRAVENOUS | Status: AC
  Administered 2016-08-04: 1000 mg via INTRAVENOUS
  Filled 2016-08-03: qty 200

## 2016-08-03 MED ORDER — FAMOTIDINE 20 MG PO TABS
20.0000 mg | ORAL_TABLET | Freq: Once | ORAL | Status: AC
  Administered 2016-08-04: 20 mg via ORAL

## 2016-08-04 ENCOUNTER — Ambulatory Visit: Payer: PPO | Admitting: Certified Registered"

## 2016-08-04 ENCOUNTER — Ambulatory Visit
Admission: RE | Admit: 2016-08-04 | Discharge: 2016-08-04 | Disposition: A | Discharge status: Home / Self Care | Payer: PPO | Source: Ambulatory Visit | Attending: Vascular Surgery | Admitting: Vascular Surgery

## 2016-08-04 ENCOUNTER — Encounter
Admission: RE | Disposition: A | Discharge status: Home / Self Care | Payer: Self-pay | Source: Ambulatory Visit | Attending: Vascular Surgery

## 2016-08-04 ENCOUNTER — Encounter: Payer: Self-pay | Admitting: *Deleted

## 2016-08-04 DIAGNOSIS — F17210 Nicotine dependence, cigarettes, uncomplicated: Secondary | ICD-10-CM | POA: Insufficient documentation

## 2016-08-04 DIAGNOSIS — J45909 Unspecified asthma, uncomplicated: Secondary | ICD-10-CM | POA: Diagnosis not present

## 2016-08-04 DIAGNOSIS — E1122 Type 2 diabetes mellitus with diabetic chronic kidney disease: Secondary | ICD-10-CM | POA: Insufficient documentation

## 2016-08-04 DIAGNOSIS — Z7984 Long term (current) use of oral hypoglycemic drugs: Secondary | ICD-10-CM | POA: Insufficient documentation

## 2016-08-04 DIAGNOSIS — G709 Myoneural disorder, unspecified: Secondary | ICD-10-CM | POA: Diagnosis not present

## 2016-08-04 DIAGNOSIS — N189 Chronic kidney disease, unspecified: Secondary | ICD-10-CM | POA: Diagnosis not present

## 2016-08-04 DIAGNOSIS — I129 Hypertensive chronic kidney disease with stage 1 through stage 4 chronic kidney disease, or unspecified chronic kidney disease: Secondary | ICD-10-CM | POA: Diagnosis not present

## 2016-08-04 DIAGNOSIS — E1142 Type 2 diabetes mellitus with diabetic polyneuropathy: Secondary | ICD-10-CM | POA: Diagnosis not present

## 2016-08-04 DIAGNOSIS — I120 Hypertensive chronic kidney disease with stage 5 chronic kidney disease or end stage renal disease: Secondary | ICD-10-CM | POA: Insufficient documentation

## 2016-08-04 DIAGNOSIS — N186 End stage renal disease: Secondary | ICD-10-CM | POA: Insufficient documentation

## 2016-08-04 DIAGNOSIS — E119 Type 2 diabetes mellitus without complications: Secondary | ICD-10-CM | POA: Diagnosis not present

## 2016-08-04 HISTORY — PX: AV FISTULA PLACEMENT: SHX1204

## 2016-08-04 LAB — GLUCOSE, CAPILLARY
GLUCOSE-CAPILLARY: 108 mg/dL — AB (ref 65–99)
Glucose-Capillary: 113 mg/dL — ABNORMAL HIGH (ref 65–99)

## 2016-08-04 LAB — ABO/RH: ABO/RH(D): A POS

## 2016-08-04 LAB — POTASSIUM: POTASSIUM: 3.4 mmol/L — AB (ref 3.5–5.1)

## 2016-08-04 SURGERY — ARTERIOVENOUS (AV) FISTULA CREATION
Anesthesia: General | Laterality: Left | Wound class: Clean

## 2016-08-04 MED ORDER — LIDOCAINE HCL 2 % EX GEL
CUTANEOUS | Status: AC
  Filled 2016-08-04: qty 5

## 2016-08-04 MED ORDER — MIDAZOLAM HCL 2 MG/2ML IJ SOLN
INTRAMUSCULAR | Status: AC
  Filled 2016-08-04: qty 2

## 2016-08-04 MED ORDER — PAPAVERINE HCL 30 MG/ML IJ SOLN
INTRAMUSCULAR | Status: AC
  Filled 2016-08-04: qty 2

## 2016-08-04 MED ORDER — SUGAMMADEX SODIUM 500 MG/5ML IV SOLN
INTRAVENOUS | Status: AC
  Filled 2016-08-04: qty 5

## 2016-08-04 MED ORDER — SODIUM CHLORIDE 0.9 % IV SOLN
INTRAVENOUS | Status: DC
  Administered 2016-08-04: 09:00:00 via INTRAVENOUS

## 2016-08-04 MED ORDER — FAMOTIDINE 20 MG PO TABS
ORAL_TABLET | ORAL | Status: AC
  Administered 2016-08-04: 20 mg via ORAL
  Filled 2016-08-04: qty 1

## 2016-08-04 MED ORDER — ROCURONIUM BROMIDE 100 MG/10ML IV SOLN
INTRAVENOUS | Status: DC | PRN
  Administered 2016-08-04 (×2): 25 mg via INTRAVENOUS

## 2016-08-04 MED ORDER — BUPIVACAINE HCL (PF) 0.5 % IJ SOLN
INTRAMUSCULAR | Status: DC | PRN
  Administered 2016-08-04: 7 mL

## 2016-08-04 MED ORDER — CHLORHEXIDINE GLUCONATE CLOTH 2 % EX PADS: 6.0000 | MEDICATED_PAD | Freq: Once | CUTANEOUS | Status: DC

## 2016-08-04 MED ORDER — LIDOCAINE HCL (PF) 2 % IJ SOLN
INTRAMUSCULAR | Status: AC
  Filled 2016-08-04: qty 2

## 2016-08-04 MED ORDER — SUGAMMADEX SODIUM 200 MG/2ML IV SOLN
INTRAVENOUS | Status: DC | PRN
  Administered 2016-08-04: 350 mg via INTRAVENOUS

## 2016-08-04 MED ORDER — PROPOFOL 10 MG/ML IV BOLUS
INTRAVENOUS | Status: AC
  Filled 2016-08-04: qty 20

## 2016-08-04 MED ORDER — ACETAMINOPHEN 10 MG/ML IV SOLN
INTRAVENOUS | Status: DC | PRN
  Administered 2016-08-04: 1000 mg via INTRAVENOUS

## 2016-08-04 MED ORDER — ONDANSETRON HCL 4 MG/2ML IJ SOLN
INTRAMUSCULAR | Status: AC
Start: 2016-08-04 — End: ?
  Filled 2016-08-04: qty 2

## 2016-08-04 MED ORDER — BUPIVACAINE HCL (PF) 0.5 % IJ SOLN
INTRAMUSCULAR | Status: AC
  Filled 2016-08-04: qty 30

## 2016-08-04 MED ORDER — FENTANYL CITRATE (PF) 100 MCG/2ML IJ SOLN: 25.0000 ug | INTRAMUSCULAR | Status: DC | PRN

## 2016-08-04 MED ORDER — GLYCOPYRROLATE 0.2 MG/ML IJ SOLN
INTRAMUSCULAR | Status: DC | PRN
  Administered 2016-08-04: 0.2 mg via INTRAVENOUS

## 2016-08-04 MED ORDER — MIDAZOLAM HCL 2 MG/2ML IJ SOLN
INTRAMUSCULAR | Status: DC | PRN
  Administered 2016-08-04: 2 mg via INTRAVENOUS

## 2016-08-04 MED ORDER — HYDROCODONE-ACETAMINOPHEN 5-325 MG PO TABS: 1.0000 | ORAL_TABLET | Freq: Four times a day (QID) | ORAL | 0 refills | Status: DC | PRN

## 2016-08-04 MED ORDER — GLYCOPYRROLATE 0.2 MG/ML IJ SOLN
INTRAMUSCULAR | Status: AC
  Filled 2016-08-04: qty 1

## 2016-08-04 MED ORDER — PROPOFOL 10 MG/ML IV BOLUS
INTRAVENOUS | Status: DC | PRN
  Administered 2016-08-04: 200 mg via INTRAVENOUS

## 2016-08-04 MED ORDER — FENTANYL CITRATE (PF) 100 MCG/2ML IJ SOLN
INTRAMUSCULAR | Status: AC
  Filled 2016-08-04: qty 2

## 2016-08-04 MED ORDER — PROMETHAZINE HCL 25 MG/ML IJ SOLN: 6.2500 mg | INTRAMUSCULAR | Status: DC | PRN

## 2016-08-04 MED ORDER — LIDOCAINE HCL (CARDIAC) 20 MG/ML IV SOLN
INTRAVENOUS | Status: DC | PRN
  Administered 2016-08-04: 50 mg via INTRAVENOUS

## 2016-08-04 MED ORDER — ACETAMINOPHEN 10 MG/ML IV SOLN
INTRAVENOUS | Status: AC
  Filled 2016-08-04: qty 100

## 2016-08-04 MED ORDER — ONDANSETRON HCL 4 MG/2ML IJ SOLN
INTRAMUSCULAR | Status: DC | PRN
  Administered 2016-08-04: 4 mg via INTRAVENOUS

## 2016-08-04 MED ORDER — ROCURONIUM BROMIDE 50 MG/5ML IV SOLN
INTRAVENOUS | Status: AC
  Filled 2016-08-04: qty 1

## 2016-08-04 MED ORDER — SUCCINYLCHOLINE CHLORIDE 20 MG/ML IJ SOLN
INTRAMUSCULAR | Status: DC | PRN
  Administered 2016-08-04: 140 mg via INTRAVENOUS

## 2016-08-04 MED ORDER — KETAMINE HCL 10 MG/ML IJ SOLN
INTRAMUSCULAR | Status: DC | PRN
  Administered 2016-08-04 (×2): 25 mg via INTRAVENOUS

## 2016-08-04 MED ORDER — SUCCINYLCHOLINE CHLORIDE 20 MG/ML IJ SOLN
INTRAMUSCULAR | Status: AC
  Filled 2016-08-04: qty 1

## 2016-08-04 MED ORDER — HEPARIN SODIUM (PORCINE) 5000 UNIT/ML IJ SOLN
INTRAMUSCULAR | Status: AC
  Filled 2016-08-04: qty 1

## 2016-08-04 MED ORDER — FENTANYL CITRATE (PF) 100 MCG/2ML IJ SOLN
INTRAMUSCULAR | Status: DC | PRN
  Administered 2016-08-04 (×4): 50 ug via INTRAVENOUS

## 2016-08-04 SURGICAL SUPPLY — 55 items
APPLIER CLIP 11 MED OPEN (CLIP)
APPLIER CLIP 9.375 SM OPEN (CLIP)
BAG DECANTER FOR FLEXI CONT (MISCELLANEOUS) ×3 IMPLANT
BLADE SURG SZ11 CARB STEEL (BLADE) ×3 IMPLANT
BOOT SUTURE AID YELLOW STND (SUTURE) ×3 IMPLANT
BRUSH SCRUB 4% CHG (MISCELLANEOUS) ×3 IMPLANT
CANISTER SUCT 1200ML W/VALVE (MISCELLANEOUS) ×3 IMPLANT
CHLORAPREP W/TINT 26ML (MISCELLANEOUS) ×3 IMPLANT
CLIP APPLIE 11 MED OPEN (CLIP) IMPLANT
CLIP APPLIE 9.375 SM OPEN (CLIP) IMPLANT
DERMABOND ADVANCED (GAUZE/BANDAGES/DRESSINGS) ×2
DERMABOND ADVANCED .7 DNX12 (GAUZE/BANDAGES/DRESSINGS) ×1 IMPLANT
DRESSING SURGICEL FIBRLLR 1X2 (HEMOSTASIS) ×1 IMPLANT
DRSG SURGICEL FIBRILLAR 1X2 (HEMOSTASIS) ×3
ELECT CAUTERY BLADE 6.4 (BLADE) ×3 IMPLANT
ELECT REM PT RETURN 9FT ADLT (ELECTROSURGICAL) ×3
ELECTRODE REM PT RTRN 9FT ADLT (ELECTROSURGICAL) ×1 IMPLANT
GEL ULTRASOUND 20GR AQUASONIC (MISCELLANEOUS) IMPLANT
GLOVE BIO SURGEON STRL SZ7 (GLOVE) ×12 IMPLANT
GLOVE INDICATOR 7.5 STRL GRN (GLOVE) ×6 IMPLANT
GLOVE SURG SYN 8.0 (GLOVE) ×3 IMPLANT
GOWN STRL REUS W/ TWL LRG LVL3 (GOWN DISPOSABLE) ×2 IMPLANT
GOWN STRL REUS W/ TWL XL LVL3 (GOWN DISPOSABLE) ×1 IMPLANT
GOWN STRL REUS W/TWL LRG LVL3 (GOWN DISPOSABLE) ×4
GOWN STRL REUS W/TWL XL LVL3 (GOWN DISPOSABLE) ×2
IV NS 500ML (IV SOLUTION) ×2
IV NS 500ML BAXH (IV SOLUTION) ×1 IMPLANT
KIT RM TURNOVER STRD PROC AR (KITS) ×3 IMPLANT
LABEL OR SOLS (LABEL) ×3 IMPLANT
LOOP RED MAXI  1X406MM (MISCELLANEOUS) ×2
LOOP VESSEL MAXI 1X406 RED (MISCELLANEOUS) ×1 IMPLANT
LOOP VESSEL MINI 0.8X406 BLUE (MISCELLANEOUS) ×2 IMPLANT
LOOPS BLUE MINI 0.8X406MM (MISCELLANEOUS) ×4
NEEDLE FILTER BLUNT 18X 1/2SAF (NEEDLE) ×2
NEEDLE FILTER BLUNT 18X1 1/2 (NEEDLE) ×1 IMPLANT
NEEDLE HYPO 30X.5 LL (NEEDLE) IMPLANT
NS IRRIG 500ML POUR BTL (IV SOLUTION) ×3 IMPLANT
PACK EXTREMITY ARMC (MISCELLANEOUS) ×3 IMPLANT
PAD PREP 24X41 OB/GYN DISP (PERSONAL CARE ITEMS) ×3 IMPLANT
PUNCH SURGICAL ROTATE 2.7MM (MISCELLANEOUS) IMPLANT
STOCKINETTE STRL 4IN 9604848 (GAUZE/BANDAGES/DRESSINGS) ×3 IMPLANT
SUT MNCRL+ 5-0 UNDYED PC-3 (SUTURE) ×1 IMPLANT
SUT MONOCRYL 5-0 (SUTURE) ×2
SUT PROLENE 6 0 BV (SUTURE) ×12 IMPLANT
SUT SILK 2 0 (SUTURE) ×2
SUT SILK 2-0 18XBRD TIE 12 (SUTURE) ×1 IMPLANT
SUT SILK 3 0 (SUTURE) ×2
SUT SILK 3-0 18XBRD TIE 12 (SUTURE) ×1 IMPLANT
SUT SILK 4 0 (SUTURE) ×2
SUT SILK 4-0 18XBRD TIE 12 (SUTURE) ×1 IMPLANT
SUT VIC AB 3-0 SH 27 (SUTURE) ×2
SUT VIC AB 3-0 SH 27X BRD (SUTURE) ×1 IMPLANT
SYR 20CC LL (SYRINGE) ×3 IMPLANT
SYR 3ML LL SCALE MARK (SYRINGE) ×3 IMPLANT
TOWEL OR 17X26 4PK STRL BLUE (TOWEL DISPOSABLE) IMPLANT

## 2016-08-04 NOTE — H&P (Signed)
[facility] History & Physical Update  The patient was interviewed and re-examined.  The patient's previous History and Physical has been reviewed and is unchanged.  There is no change in the plan of care. We plan to proceed with the scheduled procedure.  Hortencia Pilar, MD  08/04/2016, 9:40 AM

## 2016-08-04 NOTE — Discharge Instructions (Signed)

## 2016-08-04 NOTE — Anesthesia Preprocedure Evaluation (Signed)
Anesthesia Evaluation  Patient identified by MRN, date of birth, ID band Patient awake    Reviewed: Allergy & Precautions, H&P , NPO status , Patient's Chart, lab work & pertinent test results, reviewed documented beta blocker date and time   History of Anesthesia Complications Negative for: history of anesthetic complications  Airway Mallampati: IV  TM Distance: >3 FB Neck ROM: full    Dental  (+) Missing, Dental Advidsory Given   Pulmonary neg shortness of breath, asthma , sleep apnea and Continuous Positive Airway Pressure Ventilation , neg COPD, neg recent URI, Current Smoker,           Cardiovascular Exercise Tolerance: Good hypertension, On Medications and On Home Beta Blockers (-) angina(-) CAD, (-) Past MI, (-) Cardiac Stents and (-) CABG (-) dysrhythmias (-) Valvular Problems/Murmurs     Neuro/Psych neg Seizures  Neuromuscular disease (peripheral neuropathy) negative psych ROS   GI/Hepatic negative GI ROS, Neg liver ROS,   Endo/Other  diabetes, Oral Hypoglycemic AgentsMorbid obesity  Renal/GU CRFRenal disease  negative genitourinary   Musculoskeletal   Abdominal   Peds  Hematology negative hematology ROS (+)   Anesthesia Other Findings Past Medical History: No date: Asthma     Comment: triggered by enviornmental allergies No date: Diabetes mellitus without complication (HCC) No date: Hypertension No date: Renal disorder     Comment: CKD4   Reproductive/Obstetrics negative OB ROS                             Anesthesia Physical Anesthesia Plan  ASA: III  Anesthesia Plan: General ETT   Post-op Pain Management:    Induction:   Airway Management Planned:   Additional Equipment:   Intra-op Plan:   Post-operative Plan:   Informed Consent: I have reviewed the patients History and Physical, chart, labs and discussed the procedure including the risks, benefits and  alternatives for the proposed anesthesia with the patient or authorized representative who has indicated his/her understanding and acceptance.   Dental Advisory Given  Plan Discussed with: Anesthesiologist, CRNA and Surgeon  Anesthesia Plan Comments:         Anesthesia Quick Evaluation

## 2016-08-04 NOTE — Transfer of Care (Signed)
Immediate Anesthesia Transfer of Care Note  Patient: Donald Fernandez  Procedure(s) Performed: Procedure(s): ARTERIOVENOUS (AV) FISTULA CREATION ( RADIOCEPHALIC ) (Left)  Patient Location: PACU  Anesthesia Type:General  Level of Consciousness: awake  Airway & Oxygen Therapy: Patient Spontanous Breathing and Patient connected to face mask oxygen  Post-op Assessment: Report given to RN and Post -op Vital signs reviewed and stable  Post vital signs: Reviewed  Last Vitals:  Vitals:   08/04/16 0824 08/04/16 1220  BP: (!) 172/95 (!) 165/99  Pulse: 72 74  Resp: 18 18  Temp: 36.7 C 36.3 C    Last Pain:  Vitals:   08/04/16 0824  TempSrc: Oral         Complications: No apparent anesthesia complications

## 2016-08-04 NOTE — Anesthesia Post-op Follow-up Note (Cosign Needed)
Anesthesia QCDR form completed.        

## 2016-08-04 NOTE — Anesthesia Procedure Notes (Signed)
Procedure Name: Intubation Performed by: Rolla Plate Pre-anesthesia Checklist: Patient identified, Patient being monitored, Timeout performed, Emergency Drugs available and Suction available Patient Re-evaluated:Patient Re-evaluated prior to inductionOxygen Delivery Method: Circle system utilized Preoxygenation: Pre-oxygenation with 100% oxygen Intubation Type: IV induction and Rapid sequence Ventilation: Mask ventilation without difficulty, Oral airway inserted - appropriate to patient size, Two handed mask ventilation required and Mask ventilation throughout procedure Laryngoscope Size: 4 and Glidescope Grade View: Grade III Tube type: Oral Tube size: 7.5 mm Number of attempts: 2 Airway Equipment and Method: Stylet,  Video-laryngoscopy,  Patient positioned with wedge pillow and Oral airway Placement Confirmation: ETT inserted through vocal cords under direct vision,  positive ETCO2 and breath sounds checked- equal and bilateral Secured at: 21 cm Tube secured with: Tape Dental Injury: Teeth and Oropharynx as per pre-operative assessment  Difficulty Due To: Difficulty was anticipated, Difficult Airway- due to large tongue, Difficult Airway- due to reduced neck mobility and Difficult Airway- due to dentition Future Recommendations: Recommend- induction with short-acting agent, and alternative techniques readily available

## 2016-08-04 NOTE — Op Note (Signed)
     OPERATIVE NOTE   PROCEDURE: left radiocephalic arteriovenous fistula placement  PRE-OPERATIVE DIAGNOSIS: End Stage Renal Disease  POST-OPERATIVE DIAGNOSIS: End Stage Renal Disease  SURGEON: Hortencia Pilar  ASSISTANT(S):   ANESTHESIA: general  ESTIMATED BLOOD LOSS: <50 cc  FINDING(S): 3.5 mm cephalic vein  SPECIMEN(S):  none  INDICATIONS:   Donald Fernandez is a 46 y.o. male who presents with end stage renal disease.  The patient is scheduled for left brachiocephalic arteriovenous fistula placement.  The patient is aware the risks include but are not limited to: bleeding, infection, steal syndrome, nerve damage, ischemic monomelic neuropathy, failure to mature, and need for additional procedures.  The patient is aware of the risks of the procedure and elects to proceed forward.  DESCRIPTION: After full informed written consent was obtained from the patient, the patient was brought back to the operating room and placed supine upon the operating table.  Prior to induction, the patient received IV antibiotics.   After obtaining adequate anesthesia, the patient was then prepped and draped in the standard fashion for a left arm access procedure.    A linear incision was then created midway between the radial impulse and the cephalic vein. The cephalic vein was then identified and dissected circumferentially. It was marked with a surgical marker.    Attention was then turned to the radial artery which was exposed through the same incision and looped proximally and distally. Side branches were controlled with 4-0 silk ties.  The distal segment of the vein was ligated with a  2-0 silk, and the vein was transected.  The proximal segment was interrogated with serial dilators.  The vein accepted up to a 3.5 mm dilator without any difficulty. Heparinized saline was infused into the vein and clamped it with a small bulldog.  At this point, I reset my exposure of the brachial artery and  controlled the artery with vessel loops proximally and distally.  An arteriotomy was then made with a #11 blade, and extended with a Potts scissor.  Heparinized saline was injected proximal and distal into the radial artery.  The vein was then approximated to the artery while the artery was in its native bed and subsequently the vein was beveled using Potts scissors. The vein was then sewn to the artery in an end-to-side configuration with a running stitch of 6-0 Prolene.  Prior to completing this anastomosis Flushing maneuvers were performed and the artery was allowed to forward and back bleed.  There was no evidence of clot from any vessels.  I completed the anastomosis in the usual fashion and then released all vessel loops and clamps.    There was good  thrill in the venous outflow, and there was 1+ palpable radial pulse.  At this point, I irrigated out the surgical wound.  There was no further active bleeding.  The subcutaneous tissue was reapproximated with a running stitch of 3-0 Vicryl.  The skin was then reapproximated with a running subcuticular stitch of 4-0 Vicryl.  The skin was then cleaned, dried, and reinforced with Dermabond.    The patient tolerated this procedure well.   COMPLICATIONS: None  CONDITION: Donald Fernandez [facility]  Office: [phone]   08/04/2016, 12:14 PM

## 2016-08-06 DIAGNOSIS — I129 Hypertensive chronic kidney disease with stage 1 through stage 4 chronic kidney disease, or unspecified chronic kidney disease: Secondary | ICD-10-CM | POA: Diagnosis not present

## 2016-08-06 DIAGNOSIS — N2581 Secondary hyperparathyroidism of renal origin: Secondary | ICD-10-CM | POA: Diagnosis not present

## 2016-08-06 DIAGNOSIS — R809 Proteinuria, unspecified: Secondary | ICD-10-CM | POA: Diagnosis not present

## 2016-08-06 DIAGNOSIS — E1122 Type 2 diabetes mellitus with diabetic chronic kidney disease: Secondary | ICD-10-CM | POA: Diagnosis not present

## 2016-08-06 DIAGNOSIS — D631 Anemia in chronic kidney disease: Secondary | ICD-10-CM | POA: Diagnosis not present

## 2016-08-06 DIAGNOSIS — N185 Chronic kidney disease, stage 5: Secondary | ICD-10-CM | POA: Diagnosis not present

## 2016-08-06 NOTE — Anesthesia Postprocedure Evaluation (Signed)
Anesthesia Post Note  Patient: Donald Fernandez  Procedure(s) Performed: Procedure(s) (LRB): ARTERIOVENOUS (AV) FISTULA CREATION ( RADIOCEPHALIC ) (Left)  Patient location during evaluation: PACU Anesthesia Type: General Level of consciousness: awake and alert Pain management: pain level controlled Vital Signs Assessment: post-procedure vital signs reviewed and stable Respiratory status: spontaneous breathing, nonlabored ventilation, respiratory function stable and patient connected to nasal cannula oxygen Cardiovascular status: blood pressure returned to baseline and stable Postop Assessment: no signs of nausea or vomiting Anesthetic complications: no     Last Vitals:  Vitals:   08/04/16 1323 08/04/16 1329  BP: (!) 164/101 (!) 168/99  Pulse: 64 65  Resp: 18   Temp: 36.4 C     Last Pain:  Vitals:   08/05/16 1708  TempSrc:   PainSc: 0-No pain                 Martha Clan

## 2016-08-09 ENCOUNTER — Other Ambulatory Visit: Payer: Self-pay | Admitting: Neurology

## 2016-08-09 DIAGNOSIS — H4710 Unspecified papilledema: Secondary | ICD-10-CM

## 2016-08-13 NOTE — OR Nursing (Signed)
Lm to call back regarding chane of arrival time for LP which is scheduled for may 21st. (voice mail on cell phone not set up)

## 2016-08-17 ENCOUNTER — Ambulatory Visit: Payer: PPO

## 2016-08-19 ENCOUNTER — Ambulatory Visit (INDEPENDENT_AMBULATORY_CARE_PROVIDER_SITE_OTHER): Payer: PPO | Admitting: Vascular Surgery

## 2016-08-19 ENCOUNTER — Encounter (INDEPENDENT_AMBULATORY_CARE_PROVIDER_SITE_OTHER): Payer: Self-pay | Admitting: Vascular Surgery

## 2016-08-19 VITALS — BP 146/85 | HR 81 | Resp 16 | Ht 71.0 in | Wt 388.0 lb

## 2016-08-19 DIAGNOSIS — N179 Acute kidney failure, unspecified: Secondary | ICD-10-CM

## 2016-08-19 DIAGNOSIS — N189 Chronic kidney disease, unspecified: Secondary | ICD-10-CM

## 2016-08-19 DIAGNOSIS — I10 Essential (primary) hypertension: Secondary | ICD-10-CM

## 2016-08-19 DIAGNOSIS — E669 Obesity, unspecified: Secondary | ICD-10-CM

## 2016-08-19 DIAGNOSIS — E1169 Type 2 diabetes mellitus with other specified complication: Secondary | ICD-10-CM

## 2016-08-19 NOTE — Progress Notes (Signed)
Patient ID: KAMAREON SCIANDRA, male   DOB: 04/29/1970, 46 y.o.   MRN: [id number]  Chief Complaint  Patient presents with  . Routine Post Op    2 week post op follow up    HPI RISHARD DELANGE is a 46 y.o. male.  The patient is status post left radiocephalic fistula 89/21/1941. He denies pain. He denies weakness of his hand. No fever or chills.   Past Medical History:  Diagnosis Date  . Asthma    triggered by enviornmental allergies  . Diabetes mellitus without complication ([place])   . Hypertension   . Renal disorder    CKD4    Past Surgical History:  Procedure Laterality Date  . ANKLE SURGERY    . AV FISTULA PLACEMENT Left 08/04/2016   Procedure: ARTERIOVENOUS (AV) FISTULA CREATION ( RADIOCEPHALIC );  Surgeon: Katha Cabal, MD;  Location: [facility] ORS;  Service: Vascular;  Laterality: Left;      No Known Allergies  Current Outpatient Prescriptions  Medication Sig Dispense Refill  . allopurinol (ZYLOPRIM) 100 MG tablet Take 100 mg by mouth daily.    Marland Kitchen amLODipine (NORVASC) 10 MG tablet Take 10 mg by mouth daily.    Marland Kitchen aspirin EC 81 MG tablet Take 81 mg by mouth daily.    Marland Kitchen atorvastatin (LIPITOR) 20 MG tablet Take 20 mg by mouth daily.    . calcitRIOL (ROCALTROL) 0.25 MCG capsule     . Cholecalciferol (VITAMIN D PO) Take 5,000 Units by mouth daily.    . cloNIDine (CATAPRES) 0.1 MG tablet Take 1 tablet (0.1 mg total) by mouth 3 (three) times daily. 90 tablet 0  . doxazosin (CARDURA) 8 MG tablet Take 8 mg by mouth at bedtime.    . fluticasone (FLONASE) 50 MCG/ACT nasal spray Place 1 spray into both nostrils daily as needed for allergies or rhinitis.    . furosemide (LASIX) 40 MG tablet Take 40 mg by mouth daily.     Marland Kitchen gabapentin (NEURONTIN) 100 MG capsule Take 1 capsule (100 mg total) by mouth 3 (three) times daily. 90 capsule 2  . glipiZIDE (GLUCOTROL XL) 2.5 MG 24 hr tablet Take 1 tablet (2.5 mg total) by mouth daily with breakfast. 30 tablet 2  . hydrALAZINE (APRESOLINE)  100 MG tablet     . HYDROcodone-acetaminophen (NORCO) 5-325 MG tablet Take 1-2 tablets by mouth every 6 (six) hours as needed for moderate pain. 40 tablet 0  . loratadine (ALLERGY RELIEF) 10 MG tablet Take 10 mg by mouth daily as needed for allergies.    . metoprolol (TOPROL-XL) 50 MG 24 hr tablet Take 1 tablet (50 mg total) by mouth daily. 30 tablet 1  . tamsulosin (FLOMAX) 0.4 MG CAPS capsule Take 0.4 mg by mouth daily after breakfast.     . Testosterone (ANDROGEL PUMP) 20.25 MG/ACT (1.62%) GEL Place 1 application onto the skin at bedtime. 1 pump each shoulder at bedtime    . traZODone (DESYREL) 100 MG tablet Take 100 mg by mouth at bedtime.    . vitamin E 400 UNIT capsule Take 400 Units by mouth daily.     No current facility-administered medications for this visit.         Physical Exam BP (!) 146/85 (BP Location: Right Arm)   Pulse 81   Resp 16   Ht 5\' 11"  (1.803 m)   Wt (!) 176 kg (388 lb)   BMI 54.12 kg/m  Gen:  WD/WN, NAD Skin: incision C/D/I;  left  wrist fistula good thrill good bruit     Assessment/Plan:  Chronic renal insufficiency: Fistula is patent and is in the process of maturing. He will follow-up in 2-3 months with an HDA at which time we can assess whether cannulation can begin     Hortencia Pilar 08/19/2016, 3:38 PM   This note was created with Dragon medical transcription system.  Any errors from dictation are unintentional.

## 2016-08-20 ENCOUNTER — Ambulatory Visit
Admission: RE | Admit: 2016-08-20 | Discharge: 2016-08-20 | Disposition: A | Discharge status: Home / Self Care | Payer: PPO | Source: Ambulatory Visit | Attending: Neurology | Admitting: Neurology

## 2016-08-20 DIAGNOSIS — H4710 Unspecified papilledema: Secondary | ICD-10-CM | POA: Insufficient documentation

## 2016-08-25 ENCOUNTER — Other Ambulatory Visit
Admission: RE | Admit: 2016-08-25 | Discharge: 2016-08-25 | Disposition: A | Discharge status: Home / Self Care | Payer: PPO | Source: Ambulatory Visit | Attending: Neurology | Admitting: Neurology

## 2016-08-25 DIAGNOSIS — Z01812 Encounter for preprocedural laboratory examination: Secondary | ICD-10-CM | POA: Insufficient documentation

## 2016-08-25 LAB — APTT: APTT: 27 s (ref 24–36)

## 2016-08-30 ENCOUNTER — Ambulatory Visit
Admission: RE | Admit: 2016-08-30 | Discharge: 2016-08-30 | Disposition: A | Discharge status: Home / Self Care | Payer: PPO | Source: Ambulatory Visit | Attending: Neurology | Admitting: Neurology

## 2016-08-30 DIAGNOSIS — H4710 Unspecified papilledema: Secondary | ICD-10-CM | POA: Insufficient documentation

## 2016-08-30 DIAGNOSIS — Z0189 Encounter for other specified special examinations: Secondary | ICD-10-CM | POA: Insufficient documentation

## 2016-08-30 LAB — CSF CELL COUNT WITH DIFFERENTIAL
Eosinophils, CSF: 0 %
LYMPHS CSF: 0 %
MONOCYTE-MACROPHAGE-SPINAL FLUID: 0 %
RBC COUNT CSF: 6 /mm3 — AB (ref 0–3)
Segmented Neutrophils-CSF: 0 %
Tube #: 3
WBC CSF: 0 /mm3 (ref 0–5)

## 2016-08-30 LAB — GLUCOSE, CSF: Glucose, CSF: 70 mg/dL (ref 40–70)

## 2016-08-30 LAB — GLUCOSE, CAPILLARY
GLUCOSE-CAPILLARY: 121 mg/dL — AB (ref 65–99)
Glucose-Capillary: 117 mg/dL — ABNORMAL HIGH (ref 65–99)

## 2016-08-30 LAB — PROTEIN, CSF: TOTAL PROTEIN, CSF: 27 mg/dL (ref 15–45)

## 2016-08-30 MED ORDER — ACETAMINOPHEN 500 MG PO TABS
1000.0000 mg | ORAL_TABLET | Freq: Four times a day (QID) | ORAL | Status: DC | PRN
  Administered 2016-08-30: 1000 mg via ORAL
  Filled 2016-08-30: qty 2

## 2016-08-30 MED ORDER — LIDOCAINE HCL (PF) 1 % IJ SOLN
5.0000 mL | Freq: Once | INTRAMUSCULAR | Status: AC
  Administered 2016-08-30: 5 mL
  Filled 2016-08-30: qty 5

## 2016-08-30 NOTE — Progress Notes (Signed)
Pt. C/o HA "2-3" on scale 1-10. Med. With Tylenol 1000 mg p.o. Now. No acute distress.

## 2016-09-24 DIAGNOSIS — D631 Anemia in chronic kidney disease: Secondary | ICD-10-CM | POA: Diagnosis not present

## 2016-09-24 DIAGNOSIS — R7309 Other abnormal glucose: Secondary | ICD-10-CM | POA: Diagnosis not present

## 2016-09-24 DIAGNOSIS — I10 Essential (primary) hypertension: Secondary | ICD-10-CM | POA: Diagnosis not present

## 2016-09-24 DIAGNOSIS — N184 Chronic kidney disease, stage 4 (severe): Secondary | ICD-10-CM | POA: Diagnosis not present

## 2016-10-08 DIAGNOSIS — I120 Hypertensive chronic kidney disease with stage 5 chronic kidney disease or end stage renal disease: Secondary | ICD-10-CM | POA: Diagnosis not present

## 2016-10-08 DIAGNOSIS — N185 Chronic kidney disease, stage 5: Secondary | ICD-10-CM | POA: Diagnosis not present

## 2016-10-08 DIAGNOSIS — E1122 Type 2 diabetes mellitus with diabetic chronic kidney disease: Secondary | ICD-10-CM | POA: Diagnosis not present

## 2016-10-08 DIAGNOSIS — I129 Hypertensive chronic kidney disease with stage 1 through stage 4 chronic kidney disease, or unspecified chronic kidney disease: Secondary | ICD-10-CM | POA: Diagnosis not present

## 2016-10-08 DIAGNOSIS — R809 Proteinuria, unspecified: Secondary | ICD-10-CM | POA: Diagnosis not present

## 2016-10-08 DIAGNOSIS — N2581 Secondary hyperparathyroidism of renal origin: Secondary | ICD-10-CM | POA: Diagnosis not present

## 2016-10-08 DIAGNOSIS — D631 Anemia in chronic kidney disease: Secondary | ICD-10-CM | POA: Diagnosis not present

## 2016-10-17 DIAGNOSIS — E1122 Type 2 diabetes mellitus with diabetic chronic kidney disease: Secondary | ICD-10-CM | POA: Diagnosis not present

## 2016-10-17 DIAGNOSIS — G932 Benign intracranial hypertension: Secondary | ICD-10-CM | POA: Diagnosis not present

## 2016-10-17 DIAGNOSIS — F17210 Nicotine dependence, cigarettes, uncomplicated: Secondary | ICD-10-CM | POA: Diagnosis not present

## 2016-10-17 DIAGNOSIS — E559 Vitamin D deficiency, unspecified: Secondary | ICD-10-CM | POA: Diagnosis not present

## 2016-10-17 DIAGNOSIS — E785 Hyperlipidemia, unspecified: Secondary | ICD-10-CM | POA: Diagnosis not present

## 2016-10-17 DIAGNOSIS — R51 Headache: Secondary | ICD-10-CM | POA: Diagnosis not present

## 2016-10-17 DIAGNOSIS — E1140 Type 2 diabetes mellitus with diabetic neuropathy, unspecified: Secondary | ICD-10-CM | POA: Diagnosis not present

## 2016-10-17 DIAGNOSIS — G4733 Obstructive sleep apnea (adult) (pediatric): Secondary | ICD-10-CM | POA: Diagnosis not present

## 2016-10-17 DIAGNOSIS — I129 Hypertensive chronic kidney disease with stage 1 through stage 4 chronic kidney disease, or unspecified chronic kidney disease: Secondary | ICD-10-CM | POA: Diagnosis not present

## 2016-10-17 DIAGNOSIS — H538 Other visual disturbances: Secondary | ICD-10-CM | POA: Diagnosis not present

## 2016-10-17 DIAGNOSIS — N189 Chronic kidney disease, unspecified: Secondary | ICD-10-CM | POA: Diagnosis not present

## 2016-10-17 DIAGNOSIS — H93A9 Pulsatile tinnitus, unspecified ear: Secondary | ICD-10-CM | POA: Diagnosis not present

## 2016-10-17 DIAGNOSIS — H5713 Ocular pain, bilateral: Secondary | ICD-10-CM | POA: Diagnosis not present

## 2016-10-21 DIAGNOSIS — I10 Essential (primary) hypertension: Secondary | ICD-10-CM | POA: Diagnosis not present

## 2016-10-21 DIAGNOSIS — E669 Obesity, unspecified: Secondary | ICD-10-CM | POA: Diagnosis not present

## 2016-10-21 DIAGNOSIS — G4733 Obstructive sleep apnea (adult) (pediatric): Secondary | ICD-10-CM | POA: Diagnosis not present

## 2016-10-21 DIAGNOSIS — Z6841 Body Mass Index (BMI) 40.0 and over, adult: Secondary | ICD-10-CM | POA: Diagnosis not present

## 2016-10-21 DIAGNOSIS — N185 Chronic kidney disease, stage 5: Secondary | ICD-10-CM | POA: Diagnosis not present

## 2016-10-21 DIAGNOSIS — I639 Cerebral infarction, unspecified: Secondary | ICD-10-CM | POA: Diagnosis not present

## 2016-10-21 DIAGNOSIS — Z8679 Personal history of other diseases of the circulatory system: Secondary | ICD-10-CM | POA: Diagnosis not present

## 2016-10-21 DIAGNOSIS — Z9989 Dependence on other enabling machines and devices: Secondary | ICD-10-CM | POA: Diagnosis not present

## 2016-10-21 DIAGNOSIS — G932 Benign intracranial hypertension: Secondary | ICD-10-CM | POA: Diagnosis not present

## 2016-11-17 DIAGNOSIS — G932 Benign intracranial hypertension: Secondary | ICD-10-CM | POA: Diagnosis not present

## 2016-11-18 DIAGNOSIS — N185 Chronic kidney disease, stage 5: Secondary | ICD-10-CM | POA: Diagnosis not present

## 2016-11-18 DIAGNOSIS — E1122 Type 2 diabetes mellitus with diabetic chronic kidney disease: Secondary | ICD-10-CM | POA: Diagnosis not present

## 2016-11-18 DIAGNOSIS — I120 Hypertensive chronic kidney disease with stage 5 chronic kidney disease or end stage renal disease: Secondary | ICD-10-CM | POA: Diagnosis not present

## 2016-11-18 DIAGNOSIS — R809 Proteinuria, unspecified: Secondary | ICD-10-CM | POA: Diagnosis not present

## 2016-11-18 DIAGNOSIS — N2581 Secondary hyperparathyroidism of renal origin: Secondary | ICD-10-CM | POA: Diagnosis not present

## 2016-11-19 DIAGNOSIS — N185 Chronic kidney disease, stage 5: Secondary | ICD-10-CM | POA: Diagnosis not present

## 2016-11-19 DIAGNOSIS — G4733 Obstructive sleep apnea (adult) (pediatric): Secondary | ICD-10-CM | POA: Diagnosis not present

## 2016-11-19 DIAGNOSIS — E669 Obesity, unspecified: Secondary | ICD-10-CM | POA: Diagnosis not present

## 2016-11-19 DIAGNOSIS — H9213 Otorrhea, bilateral: Secondary | ICD-10-CM | POA: Diagnosis not present

## 2016-11-19 DIAGNOSIS — Z8673 Personal history of transient ischemic attack (TIA), and cerebral infarction without residual deficits: Secondary | ICD-10-CM | POA: Diagnosis not present

## 2016-11-19 DIAGNOSIS — I10 Essential (primary) hypertension: Secondary | ICD-10-CM | POA: Diagnosis not present

## 2016-11-19 DIAGNOSIS — Z9989 Dependence on other enabling machines and devices: Secondary | ICD-10-CM | POA: Diagnosis not present

## 2016-11-19 DIAGNOSIS — G08 Intracranial and intraspinal phlebitis and thrombophlebitis: Secondary | ICD-10-CM | POA: Diagnosis not present

## 2016-11-19 DIAGNOSIS — Z6841 Body Mass Index (BMI) 40.0 and over, adult: Secondary | ICD-10-CM | POA: Diagnosis not present

## 2016-11-19 DIAGNOSIS — G932 Benign intracranial hypertension: Secondary | ICD-10-CM | POA: Diagnosis not present

## 2016-11-25 ENCOUNTER — Other Ambulatory Visit (HOSPITAL_COMMUNITY): Payer: Self-pay | Admitting: Neurosurgery

## 2016-11-25 ENCOUNTER — Other Ambulatory Visit: Payer: Self-pay | Admitting: Neurosurgery

## 2016-11-25 DIAGNOSIS — G932 Benign intracranial hypertension: Secondary | ICD-10-CM

## 2016-12-15 ENCOUNTER — Encounter (HOSPITAL_COMMUNITY): Payer: Self-pay | Admitting: *Deleted

## 2016-12-15 NOTE — Progress Notes (Signed)
Spoke with pt for pre-op call. Pt denies cardiac history, has CKD but not dialysis. Pt is a type 2 diabetic. He will bring his most recent A1C with him the day of surgery. Pt states his fasting blood sugar is usually around 140. Instructed pt not to take his Glipizide the morning of surgery. Also instructed pt to check his blood sugar when he gets up Friday AM and every 2 hours until he leaves for the hospital. If blood sugar is 70 or below, treat with 1/2 cup of clear juice (apple or cranberry) and recheck blood sugar 15 minutes after drinking juice. If blood sugar continues to be 70 or below, call the Short Stay department and ask to speak to a nurse. Pt asked me to call his mom and give her the medications that he is to take the morning of surgery. I called and gave them to her.

## 2016-12-16 MED ORDER — DEXTROSE 5 % IV SOLN
3.0000 g | INTRAVENOUS | Status: AC
  Administered 2016-12-17: 3 g via INTRAVENOUS
  Filled 2016-12-16: qty 3000

## 2016-12-17 ENCOUNTER — Encounter (HOSPITAL_COMMUNITY): Payer: Self-pay | Admitting: General Practice

## 2016-12-17 ENCOUNTER — Encounter (HOSPITAL_COMMUNITY)
Admission: RE | Disposition: A | Discharge status: Home / Self Care | Payer: Self-pay | Source: Ambulatory Visit | Attending: Neurosurgery

## 2016-12-17 ENCOUNTER — Inpatient Hospital Stay (HOSPITAL_COMMUNITY): Payer: PPO | Admitting: Anesthesiology

## 2016-12-17 ENCOUNTER — Observation Stay (HOSPITAL_COMMUNITY)
Admission: RE | Admit: 2016-12-17 | Discharge: 2016-12-17 | Disposition: A | Discharge status: Home / Self Care | Payer: PPO | Source: Ambulatory Visit | Attending: Neurosurgery | Admitting: Neurosurgery

## 2016-12-17 ENCOUNTER — Ambulatory Visit (HOSPITAL_COMMUNITY)
Admission: RE | Admit: 2016-12-17 | Discharge: 2016-12-17 | Disposition: A | Discharge status: Home / Self Care | Payer: PPO | Source: Ambulatory Visit | Attending: Neurosurgery | Admitting: Neurosurgery

## 2016-12-17 DIAGNOSIS — Z7982 Long term (current) use of aspirin: Secondary | ICD-10-CM | POA: Diagnosis not present

## 2016-12-17 DIAGNOSIS — J45909 Unspecified asthma, uncomplicated: Secondary | ICD-10-CM | POA: Insufficient documentation

## 2016-12-17 DIAGNOSIS — I708 Atherosclerosis of other arteries: Secondary | ICD-10-CM | POA: Insufficient documentation

## 2016-12-17 DIAGNOSIS — G932 Benign intracranial hypertension: Principal | ICD-10-CM | POA: Diagnosis present

## 2016-12-17 DIAGNOSIS — F17210 Nicotine dependence, cigarettes, uncomplicated: Secondary | ICD-10-CM | POA: Diagnosis not present

## 2016-12-17 DIAGNOSIS — I129 Hypertensive chronic kidney disease with stage 1 through stage 4 chronic kidney disease, or unspecified chronic kidney disease: Secondary | ICD-10-CM | POA: Diagnosis not present

## 2016-12-17 DIAGNOSIS — E1122 Type 2 diabetes mellitus with diabetic chronic kidney disease: Secondary | ICD-10-CM | POA: Insufficient documentation

## 2016-12-17 DIAGNOSIS — N189 Chronic kidney disease, unspecified: Secondary | ICD-10-CM | POA: Diagnosis not present

## 2016-12-17 DIAGNOSIS — G4730 Sleep apnea, unspecified: Secondary | ICD-10-CM | POA: Insufficient documentation

## 2016-12-17 DIAGNOSIS — H4710 Unspecified papilledema: Secondary | ICD-10-CM | POA: Insufficient documentation

## 2016-12-17 DIAGNOSIS — N184 Chronic kidney disease, stage 4 (severe): Secondary | ICD-10-CM | POA: Insufficient documentation

## 2016-12-17 DIAGNOSIS — G2581 Restless legs syndrome: Secondary | ICD-10-CM | POA: Diagnosis not present

## 2016-12-17 DIAGNOSIS — Z7951 Long term (current) use of inhaled steroids: Secondary | ICD-10-CM | POA: Diagnosis not present

## 2016-12-17 DIAGNOSIS — E1140 Type 2 diabetes mellitus with diabetic neuropathy, unspecified: Secondary | ICD-10-CM | POA: Insufficient documentation

## 2016-12-17 HISTORY — DX: Sleep apnea, unspecified: G47.30

## 2016-12-17 HISTORY — DX: Restless legs syndrome: G25.81

## 2016-12-17 HISTORY — PX: RADIOLOGY WITH ANESTHESIA: SHX6223

## 2016-12-17 HISTORY — PX: IR ANGIO INTRA EXTRACRAN SEL INTERNAL CAROTID UNI R MOD SED: IMG5362

## 2016-12-17 HISTORY — DX: Polyneuropathy, unspecified: G62.9

## 2016-12-17 LAB — PROTIME-INR
INR: 0.97
PROTHROMBIN TIME: 12.8 s (ref 11.4–15.2)

## 2016-12-17 LAB — CBC WITH DIFFERENTIAL/PLATELET
BASOS ABS: 0 10*3/uL (ref 0.0–0.1)
Basophils Relative: 0 %
Eosinophils Absolute: 0.3 10*3/uL (ref 0.0–0.7)
Eosinophils Relative: 3 %
HEMATOCRIT: 29.9 % — AB (ref 39.0–52.0)
Hemoglobin: 9.6 g/dL — ABNORMAL LOW (ref 13.0–17.0)
LYMPHS ABS: 0.9 10*3/uL (ref 0.7–4.0)
LYMPHS PCT: 12 %
MCH: 27.7 pg (ref 26.0–34.0)
MCHC: 32.1 g/dL (ref 30.0–36.0)
MCV: 86.4 fL (ref 78.0–100.0)
Monocytes Absolute: 0.5 10*3/uL (ref 0.1–1.0)
Monocytes Relative: 6 %
NEUTROS ABS: 6.1 10*3/uL (ref 1.7–7.7)
Neutrophils Relative %: 79 %
Platelets: 168 10*3/uL (ref 150–400)
RBC: 3.46 MIL/uL — ABNORMAL LOW (ref 4.22–5.81)
RDW: 17.2 % — ABNORMAL HIGH (ref 11.5–15.5)
WBC: 7.8 10*3/uL (ref 4.0–10.5)

## 2016-12-17 LAB — BASIC METABOLIC PANEL
ANION GAP: 10 (ref 5–15)
BUN: 66 mg/dL — ABNORMAL HIGH (ref 6–20)
CHLORIDE: 115 mmol/L — AB (ref 101–111)
CO2: 17 mmol/L — ABNORMAL LOW (ref 22–32)
Calcium: 8 mg/dL — ABNORMAL LOW (ref 8.9–10.3)
Creatinine, Ser: 8.51 mg/dL — ABNORMAL HIGH (ref 0.61–1.24)
GFR calc Af Amer: 8 mL/min — ABNORMAL LOW (ref >60)
GFR, EST NON AFRICAN AMERICAN: 7 mL/min — AB (ref >60)
GLUCOSE: 133 mg/dL — AB (ref 65–99)
POTASSIUM: 4 mmol/L (ref 3.5–5.1)
SODIUM: 142 mmol/L (ref 135–145)

## 2016-12-17 LAB — SURGICAL PCR SCREEN
MRSA, PCR: NEGATIVE
Staphylococcus aureus: NEGATIVE

## 2016-12-17 LAB — HEMOGLOBIN A1C
Hgb A1c MFr Bld: 5.2 % (ref 4.8–5.6)
Mean Plasma Glucose: 102.54 mg/dL

## 2016-12-17 LAB — GLUCOSE, CAPILLARY
GLUCOSE-CAPILLARY: 120 mg/dL — AB (ref 65–99)
Glucose-Capillary: 110 mg/dL — ABNORMAL HIGH (ref 65–99)
Glucose-Capillary: 110 mg/dL — ABNORMAL HIGH (ref 65–99)

## 2016-12-17 LAB — APTT: APTT: 28 s (ref 24–36)

## 2016-12-17 SURGERY — RADIOLOGY WITH ANESTHESIA
Anesthesia: Monitor Anesthesia Care

## 2016-12-17 MED ORDER — GLIPIZIDE ER 2.5 MG PO TB24: 2.5000 mg | ORAL_TABLET | Freq: Every day | ORAL | Status: DC

## 2016-12-17 MED ORDER — LISINOPRIL 10 MG PO TABS: 10.0000 mg | ORAL_TABLET | Freq: Every day | ORAL | Status: DC

## 2016-12-17 MED ORDER — HYDRALAZINE HCL 100 MG PO TABS: 100.0000 mg | ORAL_TABLET | Freq: Three times a day (TID) | ORAL | Status: DC

## 2016-12-17 MED ORDER — METOPROLOL SUCCINATE ER 100 MG PO TB24: 100.0000 mg | ORAL_TABLET | Freq: Every day | ORAL | Status: DC

## 2016-12-17 MED ORDER — IOPAMIDOL (ISOVUE-300) INJECTION 61%
INTRAVENOUS | Status: AC
  Administered 2016-12-17: 50 mL
  Filled 2016-12-17: qty 150

## 2016-12-17 MED ORDER — TAMSULOSIN HCL 0.4 MG PO CAPS: 0.4000 mg | ORAL_CAPSULE | Freq: Every day | ORAL | Status: DC

## 2016-12-17 MED ORDER — MIDAZOLAM HCL 2 MG/2ML IJ SOLN
INTRAMUSCULAR | Status: AC
  Filled 2016-12-17: qty 2

## 2016-12-17 MED ORDER — CHLORHEXIDINE GLUCONATE CLOTH 2 % EX PADS: 6.0000 | MEDICATED_PAD | Freq: Once | CUTANEOUS | Status: DC

## 2016-12-17 MED ORDER — ATORVASTATIN CALCIUM 20 MG PO TABS: 20.0000 mg | ORAL_TABLET | Freq: Every day | ORAL | Status: DC

## 2016-12-17 MED ORDER — ASPIRIN EC 81 MG PO TBEC: 81.0000 mg | DELAYED_RELEASE_TABLET | Freq: Every day | ORAL | Status: DC

## 2016-12-17 MED ORDER — ALLOPURINOL 100 MG PO TABS: 100.0000 mg | ORAL_TABLET | Freq: Every day | ORAL | Status: DC

## 2016-12-17 MED ORDER — TESTOSTERONE 20.25 MG/ACT (1.62%) TD GEL: 1.0000 "application " | Freq: Every day | TRANSDERMAL | Status: DC

## 2016-12-17 MED ORDER — LIDOCAINE HCL (PF) 1 % IJ SOLN
INTRAMUSCULAR | Status: DC | PRN
  Administered 2016-12-17: 15 mL

## 2016-12-17 MED ORDER — DOXAZOSIN MESYLATE 8 MG PO TABS: 8.0000 mg | ORAL_TABLET | Freq: Every day | ORAL | Status: DC

## 2016-12-17 MED ORDER — FENTANYL CITRATE (PF) 100 MCG/2ML IJ SOLN
INTRAMUSCULAR | Status: AC
  Filled 2016-12-17: qty 4

## 2016-12-17 MED ORDER — TOPIRAMATE 25 MG PO TABS: 25.0000 mg | ORAL_TABLET | Freq: Every day | ORAL | Status: DC

## 2016-12-17 MED ORDER — SODIUM CHLORIDE 0.9 % IV SOLN: INTRAVENOUS | Status: DC

## 2016-12-17 MED ORDER — VITAMIN D-3 125 MCG (5000 UT) PO TABS: 1.0000 | ORAL_TABLET | Freq: Every day | ORAL | Status: DC

## 2016-12-17 MED ORDER — MIDAZOLAM HCL 5 MG/5ML IJ SOLN
INTRAMUSCULAR | Status: DC | PRN
  Administered 2016-12-17: 2 mg via INTRAVENOUS
  Administered 2016-12-17: 1 mg via INTRAVENOUS
  Administered 2016-12-17 (×2): 0.5 mg via INTRAVENOUS

## 2016-12-17 MED ORDER — CALCITRIOL 0.25 MCG PO CAPS: 0.2500 ug | ORAL_CAPSULE | ORAL | Status: DC

## 2016-12-17 MED ORDER — LIDOCAINE HCL (PF) 1 % IJ SOLN
INTRAMUSCULAR | Status: AC
  Filled 2016-12-17: qty 30

## 2016-12-17 MED ORDER — AMLODIPINE BESYLATE 10 MG PO TABS: 10.0000 mg | ORAL_TABLET | Freq: Every day | ORAL | Status: DC

## 2016-12-17 MED ORDER — TRAZODONE HCL 100 MG PO TABS: 100.0000 mg | ORAL_TABLET | Freq: Every day | ORAL | Status: DC

## 2016-12-17 MED ORDER — GABAPENTIN 100 MG PO CAPS: 100.0000 mg | ORAL_CAPSULE | Freq: Three times a day (TID) | ORAL | Status: DC

## 2016-12-17 MED ORDER — HEPARIN SODIUM (PORCINE) 1000 UNIT/ML IJ SOLN
INTRAMUSCULAR | Status: DC | PRN
  Administered 2016-12-17: 2000 [IU] via INTRAVENOUS

## 2016-12-17 MED ORDER — HYDROMORPHONE HCL 1 MG/ML IJ SOLN: 0.2500 mg | INTRAMUSCULAR | Status: DC | PRN

## 2016-12-17 MED ORDER — LACTATED RINGERS IV SOLN
INTRAVENOUS | Status: DC | PRN
  Administered 2016-12-17: 13:00:00 via INTRAVENOUS

## 2016-12-17 MED ORDER — CLONIDINE HCL 0.1 MG PO TABS: 0.1000 mg | ORAL_TABLET | Freq: Three times a day (TID) | ORAL | Status: DC

## 2016-12-17 MED ORDER — HYDROCODONE-ACETAMINOPHEN 5-325 MG PO TABS: 1.0000 | ORAL_TABLET | ORAL | Status: DC | PRN

## 2016-12-17 MED ORDER — FLUTICASONE PROPIONATE 50 MCG/ACT NA SUSP: 1.0000 | Freq: Every day | NASAL | Status: DC | PRN

## 2016-12-17 MED ORDER — VITAMIN E 180 MG (400 UNIT) PO CAPS: 400.0000 [IU] | ORAL_CAPSULE | Freq: Every day | ORAL | Status: DC

## 2016-12-17 MED ORDER — FUROSEMIDE 40 MG PO TABS: 40.0000 mg | ORAL_TABLET | Freq: Every day | ORAL | Status: DC

## 2016-12-17 MED ORDER — FENTANYL CITRATE (PF) 100 MCG/2ML IJ SOLN
INTRAMUSCULAR | Status: DC | PRN
  Administered 2016-12-17: 25 ug via INTRAVENOUS
  Administered 2016-12-17: 50 ug via INTRAVENOUS
  Administered 2016-12-17 (×3): 25 ug via INTRAVENOUS

## 2016-12-17 NOTE — Progress Notes (Signed)
Patient resting stable in PACU.  Has found a ride home this evening.  Discussed with Dr. Kathyrn Sheriff who is okay for patient to d/c home tonight once bedrest complete.

## 2016-12-17 NOTE — Anesthesia Preprocedure Evaluation (Signed)
Anesthesia Evaluation  Patient identified by MRN, date of birth, ID band Patient awake    Reviewed: Allergy & Precautions, NPO status , Patient's Chart, lab work & pertinent test results  Airway Mallampati: II  TM Distance: >3 FB     Dental   Pulmonary asthma , sleep apnea , Current Smoker,    breath sounds clear to auscultation       Cardiovascular hypertension,  Rhythm:Regular Rate:Normal     Neuro/Psych    GI/Hepatic negative GI ROS, Neg liver ROS,   Endo/Other  diabetes  Renal/GU Renal disease     Musculoskeletal   Abdominal   Peds  Hematology   Anesthesia Other Findings   Reproductive/Obstetrics                             Anesthesia Physical Anesthesia Plan  ASA: III  Anesthesia Plan: General   Post-op Pain Management:    Induction: Intravenous  PONV Risk Score and Plan: 1 and Ondansetron, Dexamethasone and Treatment may vary due to age or medical condition  Airway Management Planned: Oral ETT  Additional Equipment:   Intra-op Plan:   Post-operative Plan: Possible Post-op intubation/ventilation  Informed Consent: I have reviewed the patients History and Physical, chart, labs and discussed the procedure including the risks, benefits and alternatives for the proposed anesthesia with the patient or authorized representative who has indicated his/her understanding and acceptance.   Dental advisory given  Plan Discussed with: CRNA and Anesthesiologist  Anesthesia Plan Comments:         Anesthesia Quick Evaluation

## 2016-12-17 NOTE — H&P (Signed)
Chief Complaint  Headache, blurry vision    History of Present Illness  Donald Fernandez is a 46 y.o. male with headache and blurry vision, and LP demonstrated elevated OP. He was also found to have bilateral papilledema and was diagnosed with IIH. His workup included MRV which demonstrates stenosis R>L of the transverse sigmoid junction.  Past Medical History   Past Medical History:  Diagnosis Date  . Asthma    triggered by enviornmental allergies  . Diabetes mellitus without complication ([place])   . Hypertension   . Neuropathy   . Renal disorder    CKD4 - not on dialysis  . Restless legs   . Sleep apnea    uses cpap    Past Surgical History   Past Surgical History:  Procedure Laterality Date  . ANKLE SURGERY    . AV FISTULA PLACEMENT Left 08/04/2016   Procedure: ARTERIOVENOUS (AV) FISTULA CREATION ( RADIOCEPHALIC );  Surgeon: Katha Cabal, MD;  Location: [facility] ORS;  Service: Vascular;  Laterality: Left;    Social History   Social History  Substance Use Topics  . Smoking status: Current Every Day Smoker    Types: Cigarettes  . Smokeless tobacco: Never Used     Comment: pt refusept says 1 pack of cigarette lasts him 1-2 weeks  . Alcohol use Yes     Comment: a few beers per year.    Medications   Prior to Admission medications   Medication Sig Start Date End Date Taking? Authorizing Provider  amLODipine (NORVASC) 10 MG tablet Take 10 mg by mouth daily.   Yes [provider]  aspirin EC 81 MG tablet Take 81 mg by mouth daily.   Yes [provider]  atorvastatin (LIPITOR) 20 MG tablet Take 20 mg by mouth daily.   Yes [provider]  calcitRIOL (ROCALTROL) 0.25 MCG capsule Take 0.25 mcg by mouth 3 (three) times a week.  08/09/16  Yes [provider]  Cholecalciferol (VITAMIN D-3) 5000 units TABS Take 1 tablet by mouth daily.   Yes [provider]  cloNIDine (CATAPRES) 0.1 MG tablet Take 1 tablet (0.1 mg total) by mouth 3  (three) times daily. 07/11/16  Yes [name], MD  doxazosin (CARDURA) 8 MG tablet Take 8 mg by mouth at bedtime.   Yes [provider]  furosemide (LASIX) 40 MG tablet Take 40 mg by mouth daily.    Yes [provider]  gabapentin (NEURONTIN) 100 MG capsule Take 1 capsule (100 mg total) by mouth 3 (three) times daily. 07/11/16  Yes [name], MD  glipiZIDE (GLUCOTROL XL) 2.5 MG 24 hr tablet Take 1 tablet (2.5 mg total) by mouth daily with breakfast. 07/11/16  Yes [name], MD  hydrALAZINE (APRESOLINE) 100 MG tablet Take 100 mg by mouth 3 (three) times daily.  08/16/16  Yes [provider]  metoprolol succinate (TOPROL-XL) 100 MG 24 hr tablet Take 1 tablet by mouth daily. 11/03/16  Yes [provider]  quinapril (ACCUPRIL) 10 MG tablet Take 1 tablet by mouth daily. 10/23/16  Yes [provider]  tamsulosin (FLOMAX) 0.4 MG CAPS capsule Take 0.4 mg by mouth daily after breakfast.    Yes [provider]  topiramate (TOPAMAX) 25 MG tablet Take 25 mg by mouth at bedtime.  10/21/16  Yes [provider]  traZODone (DESYREL) 100 MG tablet Take 100 mg by mouth at bedtime.   Yes [provider]  vitamin E 400 UNIT capsule Take 400 Units  by mouth daily.   Yes [provider]  allopurinol (ZYLOPRIM) 100 MG tablet Take 100 mg by mouth daily.    [provider]  fluticasone (FLONASE) 50 MCG/ACT nasal spray Place 1 spray into both nostrils daily as needed for allergies or rhinitis.    [provider]  Testosterone (ANDROGEL PUMP) 20.25 MG/ACT (1.62%) GEL Place 1 application onto the skin at bedtime. 1 pump each shoulder at bedtime    [provider]    Allergies  No Known Allergies  Review of Systems  ROS  Neurologic Exam  Awake, alert, oriented Memory and concentration grossly intact Speech fluent, appropriate CN grossly intact Motor exam: Upper Extremities Deltoid Bicep Tricep  Grip  Right 5/5 5/5 5/5 5/5  Left 5/5 5/5 5/5 5/5   Lower Extremities IP Quad PF DF EHL  Right 5/5 5/5 5/5 5/5 5/5  Left 5/5 5/5 5/5 5/5 5/5   Sensation grossly intact to LT  Imaging  MRV demonstrates slightly right dominant transverse sinus with bilateral stenosis at the transverse-sigmoid junction.  Impression  - 46 y.o. male with IIH and possible venous sinus stenosis as the etiology.  Plan  - Will proceed with arteriogram/venogram with trans-stenosis manometry  I have reviewed the indications, risks, benefits, and alternatives to the procedure with the patient in the office. All questions were answered.

## 2016-12-17 NOTE — Progress Notes (Signed)
Patient instructed to keep groin dressing sites clean, dry and intact.  He will monitor for bleeding and return to ER with any complications. He understands that he is not to drive today.  D/C'd home with relative, Mark.

## 2016-12-17 NOTE — Transfer of Care (Signed)
Immediate Anesthesia Transfer of Care Note  Patient: Donald Fernandez  Procedure(s) Performed: Procedure(s): Arteriogram/Venogram with right venous sinus manometry (N/A)  Patient Location: PACU  Anesthesia Type:MAC  Level of Consciousness: awake, alert  and oriented  Airway & Oxygen Therapy: Patient Spontanous Breathing  Post-op Assessment: Report given to RN and Post -op Vital signs reviewed and stable  Post vital signs: Reviewed and stable  Last Vitals:  Vitals:   12/17/16 0846  BP: 133/80  Pulse: 74  Resp: 20  SpO2: 100%    Last Pain:  Vitals:   12/17/16 0846  TempSrc: Oral      Patients Stated Pain Goal: 3 (04/59/97 7414)  Complications: No apparent anesthesia complications

## 2016-12-17 NOTE — Anesthesia Postprocedure Evaluation (Addendum)
Anesthesia Post Note  Patient: Donald Fernandez  Procedure(s) Performed: Procedure(s) (LRB): Arteriogram/Venogram with right venous sinus manometry (N/A)     Patient location during evaluation: PACU Anesthesia Type: MAC Level of consciousness: awake and alert Pain management: pain level controlled Vital Signs Assessment: post-procedure vital signs reviewed and stable Respiratory status: spontaneous breathing, nonlabored ventilation, respiratory function stable and patient connected to nasal cannula oxygen Cardiovascular status: stable and blood pressure returned to baseline Anesthetic complications: no    Last Vitals:  Vitals:   12/17/16 1650 12/17/16 1705  BP: (!) 165/88 (!) 158/90  Pulse: 68 68  Resp: 20 20  Temp:  36.6 C  SpO2: 100% 100%    Last Pain:  Vitals:   12/17/16 0846  TempSrc: Oral                 Effie Berkshire

## 2016-12-17 NOTE — Progress Notes (Signed)
Patient states that he does not have a ride home after the procedure today.  Dr. Kathyrn Sheriff made aware that patient does not have ride for discharge today.

## 2016-12-20 ENCOUNTER — Encounter (HOSPITAL_COMMUNITY): Payer: Self-pay | Admitting: Neurosurgery

## 2016-12-21 ENCOUNTER — Encounter (HOSPITAL_COMMUNITY): Payer: Self-pay | Admitting: Neurosurgery

## 2016-12-21 NOTE — Addendum Note (Signed)
Addendum  created 12/21/16 0941 by Effie Berkshire, MD   Sign clinical note, SmartForm saved

## 2016-12-27 DIAGNOSIS — R809 Proteinuria, unspecified: Secondary | ICD-10-CM | POA: Diagnosis not present

## 2016-12-27 DIAGNOSIS — D631 Anemia in chronic kidney disease: Secondary | ICD-10-CM | POA: Diagnosis not present

## 2016-12-27 DIAGNOSIS — I120 Hypertensive chronic kidney disease with stage 5 chronic kidney disease or end stage renal disease: Secondary | ICD-10-CM | POA: Diagnosis not present

## 2016-12-27 DIAGNOSIS — E1122 Type 2 diabetes mellitus with diabetic chronic kidney disease: Secondary | ICD-10-CM | POA: Diagnosis not present

## 2016-12-27 DIAGNOSIS — N2581 Secondary hyperparathyroidism of renal origin: Secondary | ICD-10-CM | POA: Diagnosis not present

## 2016-12-27 DIAGNOSIS — E872 Acidosis: Secondary | ICD-10-CM | POA: Diagnosis not present

## 2016-12-27 DIAGNOSIS — N185 Chronic kidney disease, stage 5: Secondary | ICD-10-CM | POA: Diagnosis not present

## 2016-12-27 NOTE — Discharge Summary (Signed)
  Physician Discharge Summary  Patient ID: Donald Fernandez MRN: [id number] DOB/AGE: 48-Oct-1972 48 y.o.  Admit date: 12/17/2016 Discharge date: 12/27/2016  Admission Diagnoses:  Pseudotumor cerebri  Discharge Diagnoses:  Same Active Problems:   Pseudotumor cerebri   Discharged Condition: Stable  Hospital Course:  [name] is a 46 y.o. male who underwent Diagnostic angiogram with pressure monitoring in the left transverse sinus, under monitored anesthesia care.  He tolerated the procedure well, and was discharged in stable condition at neurologic baseline from the PACU.    Discharge Exam: Blood pressure (!) 158/90, pulse 68, temperature 97.8 F (36.6 C), resp. rate 20, height 5\' 11"  (1.803 m), weight (!) 171.5 kg (378 lb), SpO2 100 %. Awake, alert, oriented Speech fluent, appropriate CN grossly intact 5/5 BUE/BLE Wound c/d/i  Disposition: 01-Home or Self Care   Allergies as of 12/17/2016   No Known Allergies     Medication List    ASK your doctor about these medications   allopurinol 100 MG tablet Commonly known as:  ZYLOPRIM Take 100 mg by mouth daily.   amLODipine 10 MG tablet Commonly known as:  NORVASC Take 10 mg by mouth daily.   ANDROGEL PUMP 20.25 MG/ACT (1.62%) Gel Generic drug:  Testosterone Place 1 application onto the skin at bedtime. 1 pump each shoulder at bedtime   aspirin EC 81 MG tablet Take 81 mg by mouth daily.   atorvastatin 20 MG tablet Commonly known as:  LIPITOR Take 20 mg by mouth daily.   calcitRIOL 0.25 MCG capsule Commonly known as:  ROCALTROL Take 0.25 mcg by mouth 3 (three) times a week.   cloNIDine 0.1 MG tablet Commonly known as:  CATAPRES Take 1 tablet (0.1 mg total) by mouth 3 (three) times daily.   doxazosin 8 MG tablet Commonly known as:  CARDURA Take 8 mg by mouth at bedtime.   fluticasone 50 MCG/ACT nasal spray Commonly known as:  FLONASE Place 1 spray into both nostrils daily as needed for allergies or  rhinitis.   furosemide 40 MG tablet Commonly known as:  LASIX Take 40 mg by mouth daily.   gabapentin 100 MG capsule Commonly known as:  NEURONTIN Take 1 capsule (100 mg total) by mouth 3 (three) times daily.   glipiZIDE 2.5 MG 24 hr tablet Commonly known as:  GLUCOTROL XL Take 1 tablet (2.5 mg total) by mouth daily with breakfast.   hydrALAZINE 100 MG tablet Commonly known as:  APRESOLINE Take 100 mg by mouth 3 (three) times daily.   metoprolol succinate 100 MG 24 hr tablet Commonly known as:  TOPROL-XL Take 1 tablet by mouth daily.   quinapril 10 MG tablet Commonly known as:  ACCUPRIL Take 1 tablet by mouth daily.   tamsulosin 0.4 MG Caps capsule Commonly known as:  FLOMAX Take 0.4 mg by mouth daily after breakfast.   topiramate 25 MG tablet Commonly known as:  TOPAMAX Take 25 mg by mouth at bedtime.   traZODone 100 MG tablet Commonly known as:  DESYREL Take 100 mg by mouth at bedtime.   Vitamin D-3 5000 units Tabs Take 1 tablet by mouth daily.   vitamin E 400 UNIT capsule Take 400 Units by mouth daily.        SignedConsuella Lose, C 12/27/2016, 2:56 PM

## 2017-01-05 ENCOUNTER — Ambulatory Visit (INDEPENDENT_AMBULATORY_CARE_PROVIDER_SITE_OTHER): Payer: PPO | Admitting: Vascular Surgery

## 2017-01-05 ENCOUNTER — Encounter (INDEPENDENT_AMBULATORY_CARE_PROVIDER_SITE_OTHER): Payer: PPO

## 2017-01-24 DIAGNOSIS — H4710 Unspecified papilledema: Secondary | ICD-10-CM | POA: Diagnosis not present

## 2017-01-26 ENCOUNTER — Encounter (INDEPENDENT_AMBULATORY_CARE_PROVIDER_SITE_OTHER): Payer: Self-pay | Admitting: Vascular Surgery

## 2017-01-26 ENCOUNTER — Ambulatory Visit (INDEPENDENT_AMBULATORY_CARE_PROVIDER_SITE_OTHER): Payer: PPO | Admitting: Vascular Surgery

## 2017-01-26 ENCOUNTER — Ambulatory Visit (INDEPENDENT_AMBULATORY_CARE_PROVIDER_SITE_OTHER): Payer: PPO

## 2017-01-26 VITALS — BP 143/95 | HR 64 | Resp 18 | Ht 71.0 in | Wt 380.0 lb

## 2017-01-26 DIAGNOSIS — E669 Obesity, unspecified: Secondary | ICD-10-CM | POA: Diagnosis not present

## 2017-01-26 DIAGNOSIS — I10 Essential (primary) hypertension: Secondary | ICD-10-CM | POA: Diagnosis not present

## 2017-01-26 DIAGNOSIS — N189 Chronic kidney disease, unspecified: Secondary | ICD-10-CM | POA: Diagnosis not present

## 2017-01-26 DIAGNOSIS — E1169 Type 2 diabetes mellitus with other specified complication: Secondary | ICD-10-CM | POA: Diagnosis not present

## 2017-01-26 DIAGNOSIS — N179 Acute kidney failure, unspecified: Secondary | ICD-10-CM | POA: Diagnosis not present

## 2017-01-26 NOTE — Progress Notes (Signed)
Subjective:    Patient ID: Donald Fernandez, male    DOB: 29-Apr-1970, 46 y.o.   MRN: [id number] Chief Complaint  Patient presents with  . Follow-up    2-3 week follow up    Patient presents to review vascular studies. He was last seen in May 2018. He has not started dialysis at this time. He is unsure of his kidney function at this time. He denies any left upper extremity / hand pain. The patient underwent a left upper extremity hemodialysis access duplex exam. Patient is status post a left radiocephalic AV fistula creation on 08/04/2016. Patent left radiocephalic AV fistula with competing branches and a hemodynamically significant velocity increase in the proximal forearm outflow vein. Denies any ulceration to the left hand. Denies any shortness of breath or chest pain. Denies any fever, nausea or vomiting.   Review of Systems  Constitutional: Negative.   HENT: Negative.   Eyes: Negative.   Respiratory: Negative.   Cardiovascular: Negative.   Gastrointestinal: Negative.   Endocrine: Negative.   Genitourinary: Negative.   Musculoskeletal: Negative.   Skin: Negative.   Allergic/Immunologic: Negative.   Neurological: Negative.   Hematological: Negative.   Psychiatric/Behavioral: Negative.       Objective:   Physical Exam  Constitutional: He is oriented to person, place, and time. He appears well-developed and well-nourished. No distress.  HENT:  Head: Normocephalic and atraumatic.  Eyes: Pupils are equal, round, and reactive to light. Conjunctivae are normal.  Neck: Normal range of motion.  Cardiovascular: Normal rate, regular rhythm, normal heart sounds and intact distal pulses.   Pulses:      Radial pulses are 2+ on the right side, and 2+ on the left side.  Left upper extremity AV fistula: good bruit and thrill noted on exam. Skin is intact.  Pulmonary/Chest: Effort normal.  Musculoskeletal: Normal range of motion. He exhibits no edema.  Neurological: He is alert and  oriented to person, place, and time.  Skin: Skin is warm and dry. He is not diaphoretic.  Psychiatric: He has a normal mood and affect. His behavior is normal. Judgment and thought content normal.  Vitals reviewed.   BP (!) 143/95 (BP Location: Right Arm)   Pulse 64   Resp 18   Ht 5\' 11"  (1.803 m)   Wt (!) 380 lb (172.4 kg)   BMI 53.00 kg/m   Past Medical History:  Diagnosis Date  . Asthma    triggered by enviornmental allergies  . Diabetes mellitus without complication ([place])   . Hypertension   . Neuropathy   . Renal disorder    CKD4 - not on dialysis  . Restless legs   . Sleep apnea    uses cpap    Social History   Social History  . Marital status: Single    Spouse name: N/A  . Number of children: N/A  . Years of education: N/A   Occupational History  . Not on file.   Social History Main Topics  . Smoking status: Current Every Day Smoker    Types: Cigarettes  . Smokeless tobacco: Never Used     Comment: pt refusept says 1 pack of cigarette lasts him 1-2 weeks  . Alcohol use Yes     Comment: a few beers per year.  . Drug use: No  . Sexual activity: Yes    Birth control/ protection: Condom   Other Topics Concern  . Not on file   Social History Narrative  . No narrative on  file    Past Surgical History:  Procedure Laterality Date  . ANKLE SURGERY    . AV FISTULA PLACEMENT Left 08/04/2016   Procedure: ARTERIOVENOUS (AV) FISTULA CREATION ( RADIOCEPHALIC );  Surgeon: Katha Cabal, MD;  Location: [facility] ORS;  Service: Vascular;  Laterality: Left;  . IR ANGIO INTRA EXTRACRAN SEL INTERNAL CAROTID UNI R MOD SED  12/17/2016  . RADIOLOGY WITH ANESTHESIA N/A 12/17/2016   Procedure: Arteriogram/Venogram with right venous sinus manometry;  Surgeon: Consuella Lose, MD;  Location: [place];  Service: Radiology;  Laterality: N/A;    Family History  Problem Relation Age of Onset  . AAA (abdominal aortic aneurysm) Mother     No Known Allergies     Assessment &  Plan:  Patient presents to review vascular studies. He was last seen in May 2018. He has not started dialysis at this time. He is unsure of his kidney function at this time. He denies any left upper extremity / hand pain. The patient underwent a left upper extremity hemodialysis access duplex exam. Patient is status post a left radiocephalic AV fistula creation on 08/04/2016. Patent left radiocephalic AV fistula with competing branches and a hemodynamically significant velocity increase in the proximal forearm outflow vein. Denies any ulceration to the left hand. Denies any shortness of breath or chest pain. Denies any fever, nausea or vomiting.  1. Chronic kidney disease, unspecified CKD stage - stable Patient with an area of significant stenosis on duplex exam today. Physical exam is unremarkable. There is a good bruit and thrill. Skin is intact. Patient has not started dialysis as of yet Normally I would recommend a left approximately fistulogram however the patient is not on dialysis yet and I feared the dye load to be nephrotoxic Patient is to call the office if at any time he starts dialysis. Patient follow-up every 6 months  - VAS US DUPLEX DIALYSIS ACCESS (AVF,AVG); Future  2. Diabetes mellitus type 2 in obese (HCC) - Stable Encouraged good control as its slows the progression of atherosclerotic disease  3. Essential hypertension - Stable Encouraged good control as its slows the progression of atherosclerotic disease  Current Outpatient Prescriptions on File Prior to Visit  Medication Sig Dispense Refill  . allopurinol (ZYLOPRIM) 100 MG tablet Take 100 mg by mouth daily.    Marland Kitchen amLODipine (NORVASC) 10 MG tablet Take 10 mg by mouth daily.    Marland Kitchen aspirin EC 81 MG tablet Take 81 mg by mouth daily.    Marland Kitchen atorvastatin (LIPITOR) 20 MG tablet Take 20 mg by mouth daily.    . calcitRIOL (ROCALTROL) 0.25 MCG capsule Take 0.25 mcg by mouth 3 (three) times a week.     . cloNIDine (CATAPRES) 0.1 MG  tablet Take 1 tablet (0.1 mg total) by mouth 3 (three) times daily. 90 tablet 0  . doxazosin (CARDURA) 8 MG tablet Take 8 mg by mouth at bedtime.    . fluticasone (FLONASE) 50 MCG/ACT nasal spray Place 1 spray into both nostrils daily as needed for allergies or rhinitis.    . furosemide (LASIX) 40 MG tablet Take 40 mg by mouth daily.     Marland Kitchen gabapentin (NEURONTIN) 100 MG capsule Take 1 capsule (100 mg total) by mouth 3 (three) times daily. 90 capsule 2  . glipiZIDE (GLUCOTROL XL) 2.5 MG 24 hr tablet Take 1 tablet (2.5 mg total) by mouth daily with breakfast. 30 tablet 2  . hydrALAZINE (APRESOLINE) 100 MG tablet Take 100 mg by mouth 3 (three)  times daily.     . metoprolol succinate (TOPROL-XL) 100 MG 24 hr tablet Take 1 tablet by mouth daily.    . quinapril (ACCUPRIL) 10 MG tablet Take 1 tablet by mouth daily.    . tamsulosin (FLOMAX) 0.4 MG CAPS capsule Take 0.4 mg by mouth daily after breakfast.     . Testosterone (ANDROGEL PUMP) 20.25 MG/ACT (1.62%) GEL Place 1 application onto the skin at bedtime. 1 pump each shoulder at bedtime    . topiramate (TOPAMAX) 25 MG tablet Take 25 mg by mouth at bedtime.     . traZODone (DESYREL) 100 MG tablet Take 100 mg by mouth at bedtime.    . vitamin E 400 UNIT capsule Take 400 Units by mouth daily.     No current facility-administered medications on file prior to visit.     There are no Patient Instructions on file for this visit. No Follow-up on file.   Joselito Fieldhouse A Katrice Goel, PA-C

## 2017-02-15 DIAGNOSIS — H4710 Unspecified papilledema: Secondary | ICD-10-CM | POA: Diagnosis not present

## 2017-05-05 DIAGNOSIS — N185 Chronic kidney disease, stage 5: Secondary | ICD-10-CM | POA: Diagnosis not present

## 2017-05-05 DIAGNOSIS — E872 Acidosis: Secondary | ICD-10-CM | POA: Diagnosis not present

## 2017-05-05 DIAGNOSIS — I120 Hypertensive chronic kidney disease with stage 5 chronic kidney disease or end stage renal disease: Secondary | ICD-10-CM | POA: Diagnosis not present

## 2017-05-05 DIAGNOSIS — D631 Anemia in chronic kidney disease: Secondary | ICD-10-CM | POA: Diagnosis not present

## 2017-05-05 DIAGNOSIS — N2581 Secondary hyperparathyroidism of renal origin: Secondary | ICD-10-CM | POA: Diagnosis not present

## 2017-05-05 DIAGNOSIS — E1122 Type 2 diabetes mellitus with diabetic chronic kidney disease: Secondary | ICD-10-CM | POA: Diagnosis not present

## 2017-05-23 DIAGNOSIS — G4733 Obstructive sleep apnea (adult) (pediatric): Secondary | ICD-10-CM | POA: Diagnosis not present

## 2017-05-23 DIAGNOSIS — I10 Essential (primary) hypertension: Secondary | ICD-10-CM | POA: Diagnosis not present

## 2017-05-23 DIAGNOSIS — Z9989 Dependence on other enabling machines and devices: Secondary | ICD-10-CM | POA: Diagnosis not present

## 2017-05-23 DIAGNOSIS — G932 Benign intracranial hypertension: Secondary | ICD-10-CM | POA: Diagnosis not present

## 2017-05-23 DIAGNOSIS — Z8673 Personal history of transient ischemic attack (TIA), and cerebral infarction without residual deficits: Secondary | ICD-10-CM | POA: Diagnosis not present

## 2017-05-23 DIAGNOSIS — N185 Chronic kidney disease, stage 5: Secondary | ICD-10-CM | POA: Diagnosis not present

## 2017-06-05 ENCOUNTER — Inpatient Hospital Stay
Admission: EM | Admit: 2017-06-05 | Discharge: 2017-06-09 | DRG: 907 | Disposition: A | Discharge status: Home / Self Care | Payer: PPO | Attending: Internal Medicine | Admitting: Internal Medicine

## 2017-06-05 ENCOUNTER — Encounter: Payer: Self-pay | Admitting: Intensive Care

## 2017-06-05 ENCOUNTER — Other Ambulatory Visit: Payer: Self-pay

## 2017-06-05 DIAGNOSIS — T464X5A Adverse effect of angiotensin-converting-enzyme inhibitors, initial encounter: Secondary | ICD-10-CM | POA: Diagnosis not present

## 2017-06-05 DIAGNOSIS — Z7982 Long term (current) use of aspirin: Secondary | ICD-10-CM

## 2017-06-05 DIAGNOSIS — R809 Proteinuria, unspecified: Secondary | ICD-10-CM | POA: Diagnosis not present

## 2017-06-05 DIAGNOSIS — Z8249 Family history of ischemic heart disease and other diseases of the circulatory system: Secondary | ICD-10-CM | POA: Diagnosis not present

## 2017-06-05 DIAGNOSIS — E871 Hypo-osmolality and hyponatremia: Secondary | ICD-10-CM | POA: Diagnosis not present

## 2017-06-05 DIAGNOSIS — N2581 Secondary hyperparathyroidism of renal origin: Secondary | ICD-10-CM | POA: Diagnosis not present

## 2017-06-05 DIAGNOSIS — F17210 Nicotine dependence, cigarettes, uncomplicated: Secondary | ICD-10-CM | POA: Diagnosis present

## 2017-06-05 DIAGNOSIS — T82510A Breakdown (mechanical) of surgically created arteriovenous fistula, initial encounter: Secondary | ICD-10-CM | POA: Diagnosis not present

## 2017-06-05 DIAGNOSIS — E1121 Type 2 diabetes mellitus with diabetic nephropathy: Secondary | ICD-10-CM | POA: Diagnosis not present

## 2017-06-05 DIAGNOSIS — N186 End stage renal disease: Secondary | ICD-10-CM | POA: Diagnosis not present

## 2017-06-05 DIAGNOSIS — E1122 Type 2 diabetes mellitus with diabetic chronic kidney disease: Secondary | ICD-10-CM | POA: Diagnosis present

## 2017-06-05 DIAGNOSIS — E119 Type 2 diabetes mellitus without complications: Secondary | ICD-10-CM | POA: Diagnosis not present

## 2017-06-05 DIAGNOSIS — T39395A Adverse effect of other nonsteroidal anti-inflammatory drugs [NSAID], initial encounter: Secondary | ICD-10-CM | POA: Diagnosis present

## 2017-06-05 DIAGNOSIS — I120 Hypertensive chronic kidney disease with stage 5 chronic kidney disease or end stage renal disease: Secondary | ICD-10-CM | POA: Diagnosis present

## 2017-06-05 DIAGNOSIS — N400 Enlarged prostate without lower urinary tract symptoms: Secondary | ICD-10-CM | POA: Diagnosis present

## 2017-06-05 DIAGNOSIS — T783XXA Angioneurotic edema, initial encounter: Secondary | ICD-10-CM | POA: Diagnosis present

## 2017-06-05 DIAGNOSIS — I10 Essential (primary) hypertension: Secondary | ICD-10-CM | POA: Diagnosis not present

## 2017-06-05 DIAGNOSIS — D631 Anemia in chronic kidney disease: Secondary | ICD-10-CM | POA: Diagnosis not present

## 2017-06-05 DIAGNOSIS — Z6841 Body Mass Index (BMI) 40.0 and over, adult: Secondary | ICD-10-CM

## 2017-06-05 DIAGNOSIS — E669 Obesity, unspecified: Secondary | ICD-10-CM | POA: Diagnosis not present

## 2017-06-05 DIAGNOSIS — N179 Acute kidney failure, unspecified: Secondary | ICD-10-CM | POA: Diagnosis not present

## 2017-06-05 DIAGNOSIS — T82868A Thrombosis of vascular prosthetic devices, implants and grafts, initial encounter: Secondary | ICD-10-CM | POA: Diagnosis not present

## 2017-06-05 DIAGNOSIS — Y712 Prosthetic and other implants, materials and accessory cardiovascular devices associated with adverse incidents: Secondary | ICD-10-CM | POA: Diagnosis not present

## 2017-06-05 DIAGNOSIS — Z7951 Long term (current) use of inhaled steroids: Secondary | ICD-10-CM | POA: Diagnosis not present

## 2017-06-05 DIAGNOSIS — J45909 Unspecified asthma, uncomplicated: Secondary | ICD-10-CM | POA: Diagnosis not present

## 2017-06-05 DIAGNOSIS — R600 Localized edema: Secondary | ICD-10-CM | POA: Diagnosis not present

## 2017-06-05 DIAGNOSIS — E785 Hyperlipidemia, unspecified: Secondary | ICD-10-CM | POA: Diagnosis present

## 2017-06-05 DIAGNOSIS — G4733 Obstructive sleep apnea (adult) (pediatric): Secondary | ICD-10-CM | POA: Diagnosis not present

## 2017-06-05 DIAGNOSIS — G2581 Restless legs syndrome: Secondary | ICD-10-CM | POA: Diagnosis not present

## 2017-06-05 DIAGNOSIS — E872 Acidosis: Secondary | ICD-10-CM | POA: Diagnosis not present

## 2017-06-05 DIAGNOSIS — E1140 Type 2 diabetes mellitus with diabetic neuropathy, unspecified: Secondary | ICD-10-CM | POA: Diagnosis present

## 2017-06-05 DIAGNOSIS — Z7984 Long term (current) use of oral hypoglycemic drugs: Secondary | ICD-10-CM

## 2017-06-05 DIAGNOSIS — Z992 Dependence on renal dialysis: Secondary | ICD-10-CM | POA: Diagnosis not present

## 2017-06-05 DIAGNOSIS — E291 Testicular hypofunction: Secondary | ICD-10-CM | POA: Diagnosis present

## 2017-06-05 DIAGNOSIS — M109 Gout, unspecified: Secondary | ICD-10-CM | POA: Diagnosis present

## 2017-06-05 DIAGNOSIS — E875 Hyperkalemia: Secondary | ICD-10-CM | POA: Diagnosis present

## 2017-06-05 LAB — COMPREHENSIVE METABOLIC PANEL
ALK PHOS: 73 U/L (ref 38–126)
ALT: 15 U/L — AB (ref 17–63)
AST: 18 U/L (ref 15–41)
Albumin: 3.7 g/dL (ref 3.5–5.0)
Anion gap: 14 (ref 5–15)
BUN: 87 mg/dL — AB (ref 6–20)
CALCIUM: 8.5 mg/dL — AB (ref 8.9–10.3)
CO2: 15 mmol/L — AB (ref 22–32)
Chloride: 106 mmol/L (ref 101–111)
Creatinine, Ser: 14.4 mg/dL — ABNORMAL HIGH (ref 0.61–1.24)
GFR calc Af Amer: 4 mL/min — ABNORMAL LOW (ref >60)
GFR calc non Af Amer: 3 mL/min — ABNORMAL LOW (ref >60)
GLUCOSE: 140 mg/dL — AB (ref 65–99)
Potassium: 4.5 mmol/L (ref 3.5–5.1)
SODIUM: 135 mmol/L (ref 135–145)
Total Bilirubin: 0.7 mg/dL (ref 0.3–1.2)
Total Protein: 7.2 g/dL (ref 6.5–8.1)

## 2017-06-05 LAB — CBC
HCT: 28.3 % — ABNORMAL LOW (ref 40.0–52.0)
HEMOGLOBIN: 9.5 g/dL — AB (ref 13.0–18.0)
MCH: 28.7 pg (ref 26.0–34.0)
MCHC: 33.4 g/dL (ref 32.0–36.0)
MCV: 86 fL (ref 80.0–100.0)
Platelets: 175 10*3/uL (ref 150–440)
RBC: 3.3 MIL/uL — AB (ref 4.40–5.90)
RDW: 16.4 % — ABNORMAL HIGH (ref 11.5–14.5)
WBC: 7.6 10*3/uL (ref 3.8–10.6)

## 2017-06-05 LAB — GLUCOSE, CAPILLARY
Glucose-Capillary: 147 mg/dL — ABNORMAL HIGH (ref 65–99)
Glucose-Capillary: 187 mg/dL — ABNORMAL HIGH (ref 65–99)

## 2017-06-05 MED ORDER — ASPIRIN EC 81 MG PO TBEC
81.0000 mg | DELAYED_RELEASE_TABLET | Freq: Every day | ORAL | Status: DC
  Administered 2017-06-06 – 2017-06-09 (×3): 81 mg via ORAL
  Filled 2017-06-05 (×3): qty 1

## 2017-06-05 MED ORDER — DIPHENHYDRAMINE HCL 50 MG/ML IJ SOLN
50.0000 mg | Freq: Once | INTRAMUSCULAR | Status: AC
  Administered 2017-06-05: 50 mg via INTRAVENOUS

## 2017-06-05 MED ORDER — TAMSULOSIN HCL 0.4 MG PO CAPS
0.4000 mg | ORAL_CAPSULE | Freq: Every day | ORAL | Status: DC
  Administered 2017-06-06 – 2017-06-09 (×3): 0.4 mg via ORAL
  Filled 2017-06-05 (×3): qty 1

## 2017-06-05 MED ORDER — METHYLPREDNISOLONE SODIUM SUCC 125 MG IJ SOLR: 125.0000 mg | Freq: Once | INTRAMUSCULAR | Status: DC

## 2017-06-05 MED ORDER — SODIUM CHLORIDE 0.9% FLUSH
3.0000 mL | Freq: Two times a day (BID) | INTRAVENOUS | Status: DC
  Administered 2017-06-05 – 2017-06-08 (×6): 3 mL via INTRAVENOUS

## 2017-06-05 MED ORDER — SODIUM CHLORIDE 0.9 % IV BOLUS (SEPSIS)
500.0000 mL | Freq: Once | INTRAVENOUS | Status: AC
  Administered 2017-06-05: 500 mL via INTRAVENOUS

## 2017-06-05 MED ORDER — ACETAMINOPHEN 325 MG PO TABS
650.0000 mg | ORAL_TABLET | Freq: Four times a day (QID) | ORAL | Status: DC | PRN
  Administered 2017-06-07 (×2): 650 mg via ORAL
  Filled 2017-06-05 (×2): qty 2

## 2017-06-05 MED ORDER — DOXAZOSIN MESYLATE 4 MG PO TABS
8.0000 mg | ORAL_TABLET | Freq: Every day | ORAL | Status: DC
  Administered 2017-06-05 – 2017-06-08 (×4): 8 mg via ORAL
  Filled 2017-06-05 (×5): qty 2

## 2017-06-05 MED ORDER — FLUTICASONE PROPIONATE 50 MCG/ACT NA SUSP
1.0000 | Freq: Every day | NASAL | Status: DC | PRN
  Filled 2017-06-05: qty 16

## 2017-06-05 MED ORDER — HYDRALAZINE HCL 20 MG/ML IJ SOLN: 10.0000 mg | INTRAMUSCULAR | Status: DC | PRN

## 2017-06-05 MED ORDER — ONDANSETRON HCL 4 MG PO TABS: 4.0000 mg | ORAL_TABLET | Freq: Four times a day (QID) | ORAL | Status: DC | PRN

## 2017-06-05 MED ORDER — HYDRALAZINE HCL 50 MG PO TABS
100.0000 mg | ORAL_TABLET | Freq: Three times a day (TID) | ORAL | Status: DC
  Administered 2017-06-05 – 2017-06-06 (×3): 100 mg via ORAL
  Filled 2017-06-05 (×3): qty 2

## 2017-06-05 MED ORDER — HEPARIN SODIUM (PORCINE) 5000 UNIT/ML IJ SOLN
5000.0000 [IU] | Freq: Three times a day (TID) | INTRAMUSCULAR | Status: DC
  Administered 2017-06-05 – 2017-06-09 (×9): 5000 [IU] via SUBCUTANEOUS
  Filled 2017-06-05 (×9): qty 1

## 2017-06-05 MED ORDER — POLYETHYLENE GLYCOL 3350 17 G PO PACK: 17.0000 g | PACK | Freq: Every day | ORAL | Status: DC | PRN

## 2017-06-05 MED ORDER — AMLODIPINE BESYLATE 10 MG PO TABS
10.0000 mg | ORAL_TABLET | Freq: Every day | ORAL | Status: DC
  Administered 2017-06-06: 10 mg via ORAL
  Filled 2017-06-05: qty 1

## 2017-06-05 MED ORDER — ONDANSETRON HCL 4 MG/2ML IJ SOLN: 4.0000 mg | Freq: Four times a day (QID) | INTRAMUSCULAR | Status: DC | PRN

## 2017-06-05 MED ORDER — ALBUTEROL SULFATE (2.5 MG/3ML) 0.083% IN NEBU: 2.5000 mg | INHALATION_SOLUTION | Freq: Once | RESPIRATORY_TRACT | Status: DC

## 2017-06-05 MED ORDER — IPRATROPIUM-ALBUTEROL 0.5-2.5 (3) MG/3ML IN SOLN: 3.0000 mL | RESPIRATORY_TRACT | Status: DC | PRN

## 2017-06-05 MED ORDER — RACEPINEPHRINE HCL 2.25 % IN NEBU
0.5000 mL | INHALATION_SOLUTION | Freq: Once | RESPIRATORY_TRACT | Status: AC
  Administered 2017-06-05: 0.5 mL via RESPIRATORY_TRACT

## 2017-06-05 MED ORDER — SODIUM CHLORIDE 0.9 % IV SOLN: 250.0000 mL | INTRAVENOUS | Status: DC | PRN

## 2017-06-05 MED ORDER — ACETAMINOPHEN 650 MG RE SUPP: 650.0000 mg | Freq: Four times a day (QID) | RECTAL | Status: DC | PRN

## 2017-06-05 MED ORDER — FAMOTIDINE IN NACL 20-0.9 MG/50ML-% IV SOLN
20.0000 mg | Freq: Once | INTRAVENOUS | Status: AC
  Administered 2017-06-05: 20 mg via INTRAVENOUS
  Filled 2017-06-05: qty 50

## 2017-06-05 MED ORDER — FAMOTIDINE 20 MG IN NS 100 ML IVPB
20.0000 mg | Freq: Two times a day (BID) | INTRAVENOUS | Status: DC
  Administered 2017-06-05 – 2017-06-06 (×2): 20 mg via INTRAVENOUS
  Filled 2017-06-05 (×3): qty 100

## 2017-06-05 MED ORDER — FUROSEMIDE 40 MG PO TABS
40.0000 mg | ORAL_TABLET | Freq: Every day | ORAL | Status: DC
  Administered 2017-06-06 – 2017-06-09 (×3): 40 mg via ORAL
  Filled 2017-06-05 (×3): qty 1

## 2017-06-05 MED ORDER — METHYLPREDNISOLONE SODIUM SUCC 125 MG IJ SOLR
125.0000 mg | Freq: Once | INTRAMUSCULAR | Status: AC
  Administered 2017-06-05: 125 mg via INTRAVENOUS
  Filled 2017-06-05: qty 2

## 2017-06-05 MED ORDER — DIPHENHYDRAMINE HCL 50 MG/ML IJ SOLN
INTRAMUSCULAR | Status: AC
  Administered 2017-06-05: 50 mg via INTRAVENOUS
  Filled 2017-06-05: qty 1

## 2017-06-05 MED ORDER — SODIUM CHLORIDE 0.9 % IV BOLUS (SEPSIS): 1000.0000 mL | Freq: Once | INTRAVENOUS | Status: DC

## 2017-06-05 MED ORDER — INSULIN GLARGINE 100 UNIT/ML ~~LOC~~ SOLN
25.0000 [IU] | Freq: Every day | SUBCUTANEOUS | Status: DC
  Administered 2017-06-05: 23:00:00 25 [IU] via SUBCUTANEOUS
  Filled 2017-06-05 (×2): qty 0.25

## 2017-06-05 MED ORDER — GLIPIZIDE ER 2.5 MG PO TB24
2.5000 mg | ORAL_TABLET | Freq: Every day | ORAL | Status: DC
  Administered 2017-06-06: 09:00:00 2.5 mg via ORAL
  Filled 2017-06-05: qty 1

## 2017-06-05 MED ORDER — METHYLPREDNISOLONE SODIUM SUCC 125 MG IJ SOLR
80.0000 mg | Freq: Four times a day (QID) | INTRAMUSCULAR | Status: DC
  Administered 2017-06-05 – 2017-06-06 (×3): 80 mg via INTRAVENOUS
  Filled 2017-06-05 (×3): qty 2

## 2017-06-05 MED ORDER — HYDROCODONE-ACETAMINOPHEN 5-325 MG PO TABS
1.0000 | ORAL_TABLET | ORAL | Status: DC | PRN
  Administered 2017-06-08: 11:00:00 1 via ORAL
  Filled 2017-06-05: qty 1

## 2017-06-05 MED ORDER — DIPHENHYDRAMINE HCL 50 MG/ML IJ SOLN
12.5000 mg | Freq: Four times a day (QID) | INTRAMUSCULAR | Status: DC | PRN
  Filled 2017-06-05: qty 0.25

## 2017-06-05 MED ORDER — CLONIDINE HCL 0.1 MG PO TABS
0.1000 mg | ORAL_TABLET | Freq: Three times a day (TID) | ORAL | Status: DC
  Administered 2017-06-05 – 2017-06-06 (×3): 0.1 mg via ORAL
  Filled 2017-06-05 (×3): qty 1

## 2017-06-05 MED ORDER — TRAZODONE HCL 50 MG PO TABS
100.0000 mg | ORAL_TABLET | Freq: Every day | ORAL | Status: DC
  Administered 2017-06-05 – 2017-06-08 (×4): 100 mg via ORAL
  Filled 2017-06-05 (×5): qty 2

## 2017-06-05 MED ORDER — INSULIN ASPART 100 UNIT/ML ~~LOC~~ SOLN
0.0000 [IU] | Freq: Three times a day (TID) | SUBCUTANEOUS | Status: DC
  Administered 2017-06-05: 3 [IU] via SUBCUTANEOUS
  Administered 2017-06-06 (×2): 4 [IU] via SUBCUTANEOUS
  Administered 2017-06-06: 09:00:00 3 [IU] via SUBCUTANEOUS
  Administered 2017-06-07: 4 [IU] via SUBCUTANEOUS
  Filled 2017-06-05 (×5): qty 1

## 2017-06-05 MED ORDER — SODIUM CHLORIDE 0.9% FLUSH: 3.0000 mL | INTRAVENOUS | Status: DC | PRN

## 2017-06-05 MED ORDER — INSULIN ASPART 100 UNIT/ML ~~LOC~~ SOLN: 0.0000 [IU] | Freq: Every day | SUBCUTANEOUS | Status: DC

## 2017-06-05 MED ORDER — RACEPINEPHRINE HCL 2.25 % IN NEBU
INHALATION_SOLUTION | RESPIRATORY_TRACT | Status: AC
  Administered 2017-06-05: 0.5 mL via RESPIRATORY_TRACT
  Filled 2017-06-05: qty 0.5

## 2017-06-05 NOTE — ED Notes (Signed)
Patient states swelling and swallowing difficulties are becoming worse.

## 2017-06-05 NOTE — ED Notes (Signed)
Iv attempt made unsuccessful, blood sent to lab

## 2017-06-05 NOTE — ED Notes (Signed)
This RN requested phlebotomist to meet patient on the floor d/t multiple unsuccessful attempts to draw blood. Stanton Kidney, RN (receiving RN) aware.

## 2017-06-05 NOTE — ED Notes (Signed)
This RN attempted to obtain blood specimens x2. Unsuccessful.

## 2017-06-05 NOTE — ED Provider Notes (Signed)
[facility] Emergency Department Provider Note  Time seen: 12:48 PM  I have reviewed the triage vital signs and the nursing notes.   HISTORY  Chief Complaint Facial Swelling (right sided)    HPI Donald Fernandez is a 47 y.o. male with a past medical history of diabetes, hypertension, CKD, presents to the emergency department for right facial swelling.  According to the patient 1 month ago he developed facial swelling in both of his jaws.  He states he was prescribed 5 days of steroids and did well.  He states approximately 1 week ago he developed swelling in his right face however it went away the next day and was mild.  Today the patient developed increased swelling with soreness in his throat, took a nap and woke up with his right eye swollen shut.  Came to the emergency department for evaluation.  States he was having difficulty chewing and swallowing food but has no difficulty breathing.  Patient currently sitting on the bed, no distress.  Quite obese.  Past Medical History:  Diagnosis Date  . Asthma    triggered by enviornmental allergies  . Diabetes mellitus without complication ([place])   . Hypertension   . Neuropathy   . Renal disorder    CKD4 - not on dialysis  . Restless legs   . Sleep apnea    uses cpap    Patient Active Problem List   Diagnosis Date Noted  . Chronic kidney disease 01/26/2017  . Pseudotumor cerebri 12/17/2016  . Essential hypertension 08/02/2016  . Diabetes mellitus type 2 in obese ([place]) 08/02/2016  . Acute on chronic renal failure ([place]) 07/09/2016    Past Surgical History:  Procedure Laterality Date  . ANKLE SURGERY    . AV FISTULA PLACEMENT Left 08/04/2016   Procedure: ARTERIOVENOUS (AV) FISTULA CREATION ( RADIOCEPHALIC );  Surgeon: Katha Cabal, MD;  Location: [facility] ORS;  Service: Vascular;  Laterality: Left;  . IR ANGIO INTRA EXTRACRAN SEL INTERNAL CAROTID UNI R MOD SED  12/17/2016  . RADIOLOGY WITH ANESTHESIA N/A  12/17/2016   Procedure: Arteriogram/Venogram with right venous sinus manometry;  Surgeon: Consuella Lose, MD;  Location: [facility];  Service: Radiology;  Laterality: N/A;    Prior to Admission medications   Medication Sig Start Date End Date Taking? Authorizing Provider  allopurinol (ZYLOPRIM) 100 MG tablet Take 100 mg by mouth daily.    [provider]  amLODipine (NORVASC) 10 MG tablet Take 10 mg by mouth daily.    [provider]  aspirin EC 81 MG tablet Take 81 mg by mouth daily.    [provider]  atorvastatin (LIPITOR) 20 MG tablet Take 20 mg by mouth daily.    [provider]  calcitRIOL (ROCALTROL) 0.25 MCG capsule Take 0.25 mcg by mouth 3 (three) times a week.  08/09/16   [provider]  Cholecalciferol 400 units CAPS Take by mouth.    [provider]  cloNIDine (CATAPRES) 0.1 MG tablet Take 1 tablet (0.1 mg total) by mouth 3 (three) times daily. 07/11/16   [name], MD  doxazosin (CARDURA) 8 MG tablet Take 8 mg by mouth at bedtime.    [provider]  fluticasone (FLONASE) 50 MCG/ACT nasal spray Place 1 spray into both nostrils daily as needed for allergies or rhinitis.    [provider]  furosemide (LASIX) 40 MG tablet Take 40 mg by mouth daily.     [provider]  gabapentin (NEURONTIN) 100 MG  capsule Take 1 capsule (100 mg total) by mouth 3 (three) times daily. 07/11/16   [name], MD  glipiZIDE (GLUCOTROL XL) 2.5 MG 24 hr tablet Take 1 tablet (2.5 mg total) by mouth daily with breakfast. 07/11/16   [name], MD  hydrALAZINE (APRESOLINE) 100 MG tablet Take 100 mg by mouth 3 (three) times daily.  08/16/16   [provider]  metoprolol succinate (TOPROL-XL) 100 MG 24 hr tablet Take 1 tablet by mouth daily. 11/03/16   [provider]  quinapril (ACCUPRIL) 10 MG tablet Take 1 tablet by mouth daily. 10/23/16   [provider]  tamsulosin (FLOMAX) 0.4 MG CAPS  capsule Take 0.4 mg by mouth daily after breakfast.     [provider]  Testosterone (ANDROGEL PUMP) 20.25 MG/ACT (1.62%) GEL Place 1 application onto the skin at bedtime. 1 pump each shoulder at bedtime    [provider]  topiramate (TOPAMAX) 25 MG tablet Take 25 mg by mouth at bedtime.  10/21/16   [provider]  traZODone (DESYREL) 100 MG tablet Take 100 mg by mouth at bedtime.    [provider]  vitamin E 400 UNIT capsule Take 400 Units by mouth daily.    [provider]    No Known Allergies  Family History  Problem Relation Age of Onset  . AAA (abdominal aortic aneurysm) Mother     Social History Social History   Tobacco Use  . Smoking status: Current Every Day Smoker    Types: Cigarettes  . Smokeless tobacco: Never Used  . Tobacco comment: pt refusept says 1 pack of cigarette lasts him 1-2 weeks  Substance Use Topics  . Alcohol use: Yes    Comment: weekends  . Drug use: Yes    Types: Marijuana    Review of Systems Constitutional: Negative for fever. Eyes: Right eye swollen shut/periorbital edema ENT: Positive for sore throat today with sensation of swelling in the throat. Cardiovascular: Negative for chest pain. Respiratory: Negative for shortness of breath. Gastrointestinal: Negative for abdominal pain, vomiting and diarrhea. Genitourinary: Negative for urinary compaints Musculoskeletal: Negative for musculoskeletal complaints Skin: Negative for skin complaints  Neurological: Negative for headache All other ROS negative  ____________________________________________   PHYSICAL EXAM:  VITAL SIGNS: ED Triage Vitals  Enc Vitals Group     BP 06/05/17 1117 (!) 142/88     Pulse Rate 06/05/17 1117 81     Resp 06/05/17 1117 16     Temp 06/05/17 1115 98.5 F (36.9 C)     Temp Source 06/05/17 1115 Oral     SpO2 06/05/17 1117 100 %     Weight 06/05/17 1115 (!) 380 lb (172.4 kg)     Height 06/05/17 1115 5\' 11"  (1.803  m)     Head Circumference --      Peak Flow --      Pain Score --      Pain Loc --      Pain Edu? --      Excl. in [place]? --     Constitutional: Alert and oriented. Well appearing and in no distress. Eyes: Normal exam ENT   Head: Moderate right periorbital edema, able to manually retract the lid, vision is intact without visual deficits.   Mouth/Throat: Mucous membranes are moist.  No appreciable swelling of the tongue.  Patient does have a very edematous and swollen uvula. Cardiovascular: Normal rate, regular rhythm. No murmur Respiratory: Normal respiratory effort without tachypnea nor retractions. Breath sounds are  clear  Gastrointestinal: Soft and nontender. No distention.  Musculoskeletal: Nontender with normal range of motion in all extremities Neurologic:  Normal speech and language. No gross focal neurologic deficits  Skin:  Skin is warm, dry and intact.  No erythema.  No signs of facial cellulitis. Psychiatric: Mood and affect are normal.  ____________________________________________   INITIAL IMPRESSION / ASSESSMENT AND PLAN / ED COURSE  Pertinent labs & imaging results that were available during my care of the patient were reviewed by me and considered in my medical decision making (see chart for details).  Patient presents to the emergency department for right facial swelling with right periorbital edema sensation of swelling in his throat.  Differential would include allergic reaction, angioedema, cellulitis.  I reviewed the patient's medications he is taking quinapril.  Given the recurrent nature of his symptoms I discussed with the patient discontinuing use of quinapril and not using any other ACE inhibitors in the future (meds that end in -pril).  Highly suspect angioedema.  No hives, denies any itching to the skin.  No wheeze.  Given the patient's edema we will treat with Benadryl, Solu-Medrol, Pepcid.  Given the significant uvula edema we will treat with racemic  epinephrine nebulizer and continue to closely monitor.  We will check basic labs.  Overall patient appears well at this time, no distress.  Patient is obese with large facial features which would make it very difficult intubation if needed.  At this point the patient does not need airway intervention.  Patient's right periorbital edema appears to be improving somewhat however continues to have fairly significant uvula edema and swelling.  No distress.  No difficulty breathing, but states he does not feel like that has improved at all.  We will continue to closely monitor in the emergency department.  If the patient has no improvement in the uvula edema, could possibly require admission to the hospital for continued monitoring.   Given no significant improvement in uvula edema with continued sensation of swelling in his throat we will admit to the hospitalist service for continued monitoring and treatment.  No apparent worsening however, airway remains patent with no airway intervention required at this time.  ____________________________________________   FINAL CLINICAL IMPRESSION(S) / ED DIAGNOSES  Angioedema    Harvest Dark, MD 06/05/17 [phone]

## 2017-06-05 NOTE — ED Triage Notes (Signed)
Patient reports waking up this AM with Right sided facial swelling and sore throat. No respiratory distress noted. No trouble swallowing. Patient reports eating a bag of cheetos this AM before coming in

## 2017-06-05 NOTE — ED Notes (Signed)
Attempted IV access x2 unsuccessful 

## 2017-06-05 NOTE — H&P (Addendum)
[facility] NAME: Donald Fernandez    MR#:  [id number]  DATE OF BIRTH:  Mar 18, 1971  DATE OF ADMISSION:  06/05/2017  PRIMARY CARE PHYSICIAN: Denton Lank, MD   REQUESTING/REFERRING PHYSICIAN:   CHIEF COMPLAINT:   Chief Complaint  Patient presents with  . Facial Swelling    right sided    HISTORY OF PRESENT ILLNESS: Donald Fernandez  is a 47 y.o. male with a known history per below, presenting with recurrent facial/throat/neck swelling started this morning, patient has had several episodes in the past due to unknown etiology, in the emergency room patient was found to have angioedema, hospitalist consulted for further evaluation/care, patient evaluated in the emergency room, did receive racemic epinephrine/IV Solu-Medrol/Benadryl, patient in no apparent distress, now being admitted for acute recurrent angioedema most likely secondary to ACE inhibitor use.  PAST MEDICAL HISTORY:   Past Medical History:  Diagnosis Date  . Asthma    triggered by enviornmental allergies  . Diabetes mellitus without complication ([place])   . Hypertension   . Neuropathy   . Renal disorder    CKD4 - not on dialysis  . Restless legs   . Sleep apnea    uses cpap    PAST SURGICAL HISTORY:  Past Surgical History:  Procedure Laterality Date  . ANKLE SURGERY    . AV FISTULA PLACEMENT Left 08/04/2016   Procedure: ARTERIOVENOUS (AV) FISTULA CREATION ( RADIOCEPHALIC );  Surgeon: Katha Cabal, MD;  Location: [facility] ORS;  Service: Vascular;  Laterality: Left;  . IR ANGIO INTRA EXTRACRAN SEL INTERNAL CAROTID UNI R MOD SED  12/17/2016  . RADIOLOGY WITH ANESTHESIA N/A 12/17/2016   Procedure: Arteriogram/Venogram with right venous sinus manometry;  Surgeon: Consuella Lose, MD;  Location: [facility];  Service: Radiology;  Laterality: N/A;    SOCIAL HISTORY:  Social History   Tobacco Use  . Smoking status: Current Every Day Smoker    Types: Cigarettes  . Smokeless  tobacco: Never Used  . Tobacco comment: pt refusept says 1 pack of cigarette lasts him 1-2 weeks  Substance Use Topics  . Alcohol use: Yes    Comment: weekends    FAMILY HISTORY:  Family History  Problem Relation Age of Onset  . AAA (abdominal aortic aneurysm) Mother     DRUG ALLERGIES: ACE inhibitors/Accupril-resulting in angioedema  REVIEW OF SYSTEMS:   CONSTITUTIONAL: No fever, fatigue or weakness.  EYES: No blurred or double vision.  EARS, NOSE, AND THROAT: No tinnitus or ear pain.  Positive throat swelling/facial swelling RESPIRATORY: No cough, shortness of breath, wheezing or hemoptysis.  CARDIOVASCULAR: No chest pain, orthopnea, edema.  GASTROINTESTINAL: No nausea, vomiting, diarrhea or abdominal pain.  GENITOURINARY: No dysuria, hematuria.  ENDOCRINE: No polyuria, nocturia,  HEMATOLOGY: No anemia, easy bruising or bleeding SKIN: No rash or lesion. MUSCULOSKELETAL: No joint pain or arthritis.   NEUROLOGIC: No tingling, numbness, weakness.  PSYCHIATRY: No anxiety or depression.   MEDICATIONS AT HOME:  Prior to Admission medications   Medication Sig Start Date End Date Taking? Authorizing Provider  allopurinol (ZYLOPRIM) 100 MG tablet Take 100 mg by mouth daily.   Yes [provider]  amLODipine (NORVASC) 10 MG tablet Take 10 mg by mouth daily.   Yes [provider]  aspirin EC 81 MG tablet Take 81 mg by mouth daily.   Yes [provider]  atorvastatin (LIPITOR) 20 MG tablet Take 20 mg by mouth daily.   Yes [provider]  Cholecalciferol 400 units CAPS Take by mouth.   Yes [provider]  cloNIDine (CATAPRES) 0.1 MG tablet Take 1 tablet (0.1 mg total) by mouth 3 (three) times daily. 07/11/16  Yes [name], MD  doxazosin (CARDURA) 8 MG tablet Take 8 mg by mouth at bedtime.   Yes [provider]  furosemide (LASIX) 40 MG tablet Take 40 mg by mouth daily.    Yes [provider]  gabapentin  (NEURONTIN) 100 MG capsule Take 1 capsule (100 mg total) by mouth 3 (three) times daily. 07/11/16  Yes [name], MD  glipiZIDE (GLUCOTROL XL) 2.5 MG 24 hr tablet Take 1 tablet (2.5 mg total) by mouth daily with breakfast. 07/11/16  Yes [name], MD  hydrALAZINE (APRESOLINE) 100 MG tablet Take 100 mg by mouth 3 (three) times daily.  08/16/16  Yes [provider]  quinapril (ACCUPRIL) 10 MG tablet Take 1 tablet by mouth daily. 10/23/16  Yes [provider]  tamsulosin (FLOMAX) 0.4 MG CAPS capsule Take 0.4 mg by mouth daily after breakfast.    Yes [provider]  topiramate (TOPAMAX) 50 MG tablet Take 50 mg by mouth daily as needed.   Yes [provider]  traZODone (DESYREL) 100 MG tablet Take 100 mg by mouth at bedtime.   Yes [provider]  vitamin E 400 UNIT capsule Take 400 Units by mouth daily.   Yes [provider]  calcitRIOL (ROCALTROL) 0.25 MCG capsule Take 0.25 mcg by mouth 3 (three) times a week.  08/09/16   [provider]  fluticasone (FLONASE) 50 MCG/ACT nasal spray Place 1 spray into both nostrils daily as needed for allergies or rhinitis.    [provider]      PHYSICAL EXAMINATION:   VITAL SIGNS: Blood pressure 126/69, pulse 73, temperature 98.5 F (36.9 C), temperature source Oral, resp. rate (!) 24, height 5\' 11"  (1.803 m), weight (!) 172.4 kg (380 lb), SpO2 100 %.  GENERAL:  47 y.o.-year-old patient lying in the bed with no acute distress.  Extreme morbid obesity EYES: Pupils equal, round, reactive to light and accommodation. No scleral icterus. Extraocular muscles intact.  HEENT: Head atraumatic, normocephalic. Oropharynx and nasopharynx clear.  Noted angioedema NECK:  Supple, no jugular venous distention. No thyroid enlargement, no tenderness.  LUNGS: Normal breath sounds bilaterally, no wheezing, rales,rhonchi or crepitation. No use of accessory muscles of respiration.  CARDIOVASCULAR:  S1, S2 normal. No murmurs, rubs, or gallops.  ABDOMEN: Soft, nontender, nondistended. Bowel sounds present. No organomegaly or mass.  EXTREMITIES: No pedal edema, cyanosis, or clubbing.  NEUROLOGIC: Cranial nerves II through XII are intact. Muscle strength 5/5 in all extremities. Sensation intact. Gait not checked.  PSYCHIATRIC: The patient is alert and oriented x 3.  SKIN: No obvious rash, lesion, or ulcer.   LABORATORY PANEL:   CBC No results for input(s): WBC, HGB, HCT, PLT, MCV, MCH, MCHC, RDW, LYMPHSABS, MONOABS, EOSABS, BASOSABS, BANDABS in the last 168 hours.  Invalid input(s): NEUTRABS, BANDSABD ------------------------------------------------------------------------------------------------------------------  Chemistries  No results for input(s): NA, K, CL, CO2, GLUCOSE, BUN, CREATININE, CALCIUM, MG, AST, ALT, ALKPHOS, BILITOT in the last 168 hours.  Invalid input(s): GFRCGP ------------------------------------------------------------------------------------------------------------------ CrCl cannot be calculated (Patient's most recent lab result is older than the maximum 21 days allowed.). ------------------------------------------------------------------------------------------------------------------ No results for input(s): TSH, T4TOTAL, T3FREE, THYROIDAB in the last 72 hours.  Invalid input(s): FREET3   Coagulation profile No results for input(s): INR, PROTIME in the last 168 hours. ------------------------------------------------------------------------------------------------------------------- No results for input(s):  DDIMER in the last 72 hours. -------------------------------------------------------------------------------------------------------------------  Cardiac Enzymes No results for input(s): CKMB, TROPONINI, MYOGLOBIN in the last 168 hours.  Invalid input(s):  CK ------------------------------------------------------------------------------------------------------------------ Invalid input(s): POCBNP  ---------------------------------------------------------------------------------------------------------------  Urinalysis    Component Value Date/Time   COLORURINE YELLOW (A) 07/09/2016 1215   APPEARANCEUR CLEAR (A) 07/09/2016 1215   LABSPEC 1.009 07/09/2016 1215   PHURINE 6.0 07/09/2016 1215   GLUCOSEU NEGATIVE 07/09/2016 1215   HGBUR NEGATIVE 07/09/2016 1215   BILIRUBINUR NEGATIVE 07/09/2016 1215   KETONESUR NEGATIVE 07/09/2016 1215   PROTEINUR 100 (A) 07/09/2016 1215   NITRITE NEGATIVE 07/09/2016 1215   LEUKOCYTESUR TRACE (A) 07/09/2016 1215     RADIOLOGY: No results found.  EKG: Orders placed or performed during the hospital encounter of 07/29/16  . EKG test  . EKG test    IMPRESSION AND PLAN: 1 acute recurrent angioedema Most likely secondary to ACE inhibitor use Admit to regular nursing for bed, ACE inhibitor be listed as allergy, continue IV Solu-Medrol, Benadryl, IV Pepcid twice daily, supplemental oxygen as needed, clear liquid diet for now, gentle IV fluids for rehydration, follow-up on outstanding lab work, and continue close medical monitoring  2 chronic obstructive sleep apnea Stable  CPAP at night and prn  3 chronic diabetes mellitus type 2 Continue current regiment, sliding scale insulin with Accu-Cheks per routine, check hemoglobin A1c for the level control  4 chronic benign essential hypertension Accupril discontinued indefinitely, continue admission other home antihypertensives, PRN hydralazine for systolic blood pressure greater than 160, vitals per routine, and make changes as per necessary  5 history of asthma without exacerbation Breathing treatments as needed  6 chronic kidney disease stage IV Check BMP, avoid nephrotoxic agents  7 chronic extreme morbid obesity Stable  Lifestyle modification  recommended  8 acute difficult IV access Follow-up on blood work when completed  Disposition home in the next 1-2 days barring any complications   All the records are reviewed and case discussed with ED provider. Management plans discussed with the patient, family and they are in agreement.  CODE STATUS:full Code Status History    Date Active Date Inactive Code Status Order ID Comments User Context   12/17/2016 16:14 12/17/2016 21:17 Full Code [id number]  Consuella Lose, MD Inpatient   07/09/2016 13:10 07/11/2016 15:43 Full Code [id number]  Theodoro Grist, MD ED       TOTAL TIME TAKING CARE OF THIS PATIENT: 45 minutes.    Avel Peace Donald Fernandez M.D on 06/05/2017   Between 7am to 6pm - Pager - [phone]  After 6pm go to [email or web address] - password [email or web address] [facility]  Office  [phone]  CC: Primary care physician; Denton Lank, MD   Note: This dictation was prepared with Dragon dictation along with smaller phrase technology. Any transcriptional errors that result from this process are unintentional.

## 2017-06-05 NOTE — ED Notes (Signed)
Pt's blood work resulted. Stanton Kidney, RN aware that we are sending the patient now. Nicola Girt, RN to transport pt.

## 2017-06-06 ENCOUNTER — Inpatient Hospital Stay
Admission: EM | Disposition: A | Discharge status: Home / Self Care | Payer: Self-pay | Source: Home / Self Care | Attending: Internal Medicine

## 2017-06-06 DIAGNOSIS — R600 Localized edema: Secondary | ICD-10-CM

## 2017-06-06 DIAGNOSIS — I10 Essential (primary) hypertension: Secondary | ICD-10-CM

## 2017-06-06 DIAGNOSIS — E119 Type 2 diabetes mellitus without complications: Secondary | ICD-10-CM

## 2017-06-06 DIAGNOSIS — N186 End stage renal disease: Secondary | ICD-10-CM

## 2017-06-06 HISTORY — PX: DIALYSIS/PERMA CATHETER INSERTION: CATH118288

## 2017-06-06 LAB — BASIC METABOLIC PANEL
Anion gap: 12 (ref 5–15)
BUN: 91 mg/dL — AB (ref 6–20)
CHLORIDE: 105 mmol/L (ref 101–111)
CO2: 12 mmol/L — AB (ref 22–32)
CREATININE: 15.41 mg/dL — AB (ref 0.61–1.24)
Calcium: 8.1 mg/dL — ABNORMAL LOW (ref 8.9–10.3)
GFR calc Af Amer: 4 mL/min — ABNORMAL LOW (ref >60)
GFR calc non Af Amer: 3 mL/min — ABNORMAL LOW (ref >60)
GLUCOSE: 131 mg/dL — AB (ref 65–99)
Potassium: 5.3 mmol/L — ABNORMAL HIGH (ref 3.5–5.1)
Sodium: 129 mmol/L — ABNORMAL LOW (ref 135–145)

## 2017-06-06 LAB — GLUCOSE, CAPILLARY
GLUCOSE-CAPILLARY: 186 mg/dL — AB (ref 65–99)
GLUCOSE-CAPILLARY: 109 mg/dL — AB (ref 65–99)
GLUCOSE-CAPILLARY: 169 mg/dL — AB (ref 65–99)
GLUCOSE-CAPILLARY: 181 mg/dL — AB (ref 65–99)
Glucose-Capillary: 131 mg/dL — ABNORMAL HIGH (ref 65–99)
Glucose-Capillary: 165 mg/dL — ABNORMAL HIGH (ref 65–99)

## 2017-06-06 LAB — MRSA PCR SCREENING: MRSA by PCR: POSITIVE — AB

## 2017-06-06 SURGERY — DIALYSIS/PERMA CATHETER INSERTION
Anesthesia: Moderate Sedation

## 2017-06-06 MED ORDER — SODIUM POLYSTYRENE SULFONATE PO POWD: 30.0000 g | Freq: Once | ORAL | Status: DC

## 2017-06-06 MED ORDER — SODIUM POLYSTYRENE SULFONATE 15 GM/60ML PO SUSP
30.0000 g | Freq: Once | ORAL | Status: DC
  Filled 2017-06-06: qty 120

## 2017-06-06 MED ORDER — TUBERCULIN PPD 5 UNIT/0.1ML ID SOLN
5.0000 [IU] | Freq: Once | INTRADERMAL | Status: DC
  Filled 2017-06-06: qty 0.1

## 2017-06-06 MED ORDER — MUPIROCIN 2 % EX OINT
1.0000 "application " | TOPICAL_OINTMENT | Freq: Two times a day (BID) | CUTANEOUS | Status: DC
  Administered 2017-06-06 – 2017-06-09 (×6): 1 via NASAL
  Filled 2017-06-06: qty 22

## 2017-06-06 MED ORDER — FENTANYL CITRATE (PF) 100 MCG/2ML IJ SOLN
INTRAMUSCULAR | Status: DC | PRN
  Administered 2017-06-06 (×2): 50 ug via INTRAVENOUS

## 2017-06-06 MED ORDER — FENTANYL CITRATE (PF) 100 MCG/2ML IJ SOLN
INTRAMUSCULAR | Status: AC
  Filled 2017-06-06: qty 4

## 2017-06-06 MED ORDER — MIDAZOLAM HCL 2 MG/2ML IJ SOLN
INTRAMUSCULAR | Status: DC | PRN
  Administered 2017-06-06: 2 mg via INTRAVENOUS
  Administered 2017-06-06: 1 mg via INTRAVENOUS

## 2017-06-06 MED ORDER — MIDAZOLAM HCL 5 MG/5ML IJ SOLN
INTRAMUSCULAR | Status: AC
  Filled 2017-06-06: qty 5

## 2017-06-06 MED ORDER — SODIUM CHLORIDE 0.9 % IV SOLN
1.5000 g | INTRAVENOUS | Status: AC
  Administered 2017-06-06: 1.5 g via INTRAVENOUS
  Filled 2017-06-06: qty 1.5

## 2017-06-06 MED ORDER — SODIUM CHLORIDE 0.9 % IV SOLN: INTRAVENOUS | Status: DC

## 2017-06-06 MED ORDER — INSULIN GLARGINE 100 UNIT/ML ~~LOC~~ SOLN
12.0000 [IU] | Freq: Every day | SUBCUTANEOUS | Status: DC
  Administered 2017-06-06 – 2017-06-07 (×2): 12 [IU] via SUBCUTANEOUS
  Filled 2017-06-06 (×3): qty 0.12

## 2017-06-06 MED ORDER — PREDNISONE 50 MG PO TABS
50.0000 mg | ORAL_TABLET | Freq: Every day | ORAL | Status: DC
  Administered 2017-06-07: 09:00:00 50 mg via ORAL
  Filled 2017-06-06: qty 1

## 2017-06-06 MED ORDER — CHLORHEXIDINE GLUCONATE CLOTH 2 % EX PADS
6.0000 | MEDICATED_PAD | Freq: Every day | CUTANEOUS | Status: DC
  Administered 2017-06-07 – 2017-06-09 (×3): 6 via TOPICAL

## 2017-06-06 SURGICAL SUPPLY — 13 items
CATH BEACON 5 .035 40 KMP TP (CATHETERS) ×1 IMPLANT
CATH BEACON 5 .038 40 KMP TP (CATHETERS) ×2
CATH PALINDROME RT-P 15FX23CM (CATHETERS) ×3 IMPLANT
CATH PALINDROME-P 19CM W/VT (CATHETERS) ×3 IMPLANT
COVER PROBE U/S 5X48 (MISCELLANEOUS) ×3 IMPLANT
PACK ANGIOGRAPHY (CUSTOM PROCEDURE TRAY) ×3 IMPLANT
SHEATH PINNACLE 11FRX10 (SHEATH) ×3 IMPLANT
SUT MNCRL 4-0 (SUTURE) ×2
SUT MNCRL 4-0 27XMFL (SUTURE) ×1
SUT PROLENE 0 CT 1 30 (SUTURE) ×3 IMPLANT
SUTURE MNCRL 4-0 27XMF (SUTURE) ×1 IMPLANT
TOWEL OR 17X26 4PK STRL BLUE (TOWEL DISPOSABLE) ×3 IMPLANT
WIRE MAGIC TOR.035 180C (WIRE) ×3 IMPLANT

## 2017-06-06 NOTE — Progress Notes (Signed)
Pre HD assessment  

## 2017-06-06 NOTE — Progress Notes (Signed)
[facility]  ROUNDING NOTE   Subjective:   [name] admitted to [facility] on 06/05/2017 for Angioedema, initial encounter [T78.3XXA]  Patient with uremic symptoms.   Objective:  Vital signs in last 24 hours:  Temp:  [97.7 F (36.5 C)-98.4 F (36.9 C)] 98.4 F (36.9 C) (02/25 1519) Pulse Rate:  [66-87] 80 (02/25 1725) Resp:  [17-23] 18 (02/25 1725) BP: (120-160)/(73-90) 123/90 (02/25 1725) SpO2:  [96 %-100 %] 97 % (02/25 1725) Weight:  [166.9 kg (368 lb)-167 kg (368 lb 2.7 oz)] 166.9 kg (368 lb) (02/25 1519)  Weight change:  Filed Weights   06/05/17 1115 06/05/17 1806 06/06/17 1519  Weight: (!) 172.4 kg (380 lb) (!) 167 kg (368 lb 2.7 oz) (!) 166.9 kg (368 lb)    Intake/Output: I/O last 3 completed shifts: In: 650 [IV Piggyback:650] Out: -    Intake/Output this shift:  Total I/O In: 360 [P.O.:360] Out: -   Physical Exam: General: NAD,   Head: +lip and tongue edema  Eyes: Anicteric, PERRL  Neck: Supple, trachea midline  Lungs:  Clear to auscultation  Heart: Regular rate and rhythm  Abdomen:  Soft, nontender,   Extremities: trace peripheral edema.  Neurologic: Nonfocal, moving all four extremities  Skin: No lesions  Access: Left radiocephalic AVF    Basic Metabolic Panel: Recent Labs  Lab 06/05/17 1651 06/06/17 0733  NA 135 129*  K 4.5 5.3*  CL 106 105  CO2 15* 12*  GLUCOSE 140* 131*  BUN 87* 91*  CREATININE 14.40* 15.41*  CALCIUM 8.5* 8.1*    Liver Function Tests: Recent Labs  Lab 06/05/17 1651  AST 18  ALT 15*  ALKPHOS 73  BILITOT 0.7  PROT 7.2  ALBUMIN 3.7   No results for input(s): LIPASE, AMYLASE in the last 168 hours. No results for input(s): AMMONIA in the last 168 hours.  CBC: Recent Labs  Lab 06/05/17 1651  WBC 7.6  HGB 9.5*  HCT 28.3*  MCV 86.0  PLT 175    Cardiac Enzymes: No results for input(s): CKTOTAL, CKMB, CKMBINDEX, TROPONINI in the last 168 hours.  BNP: Invalid input(s):  POCBNP  CBG: Recent Labs  Lab 06/05/17 2151 06/06/17 0013 06/06/17 0557 06/06/17 1259 06/06/17 1700  GLUCAP 187* 165* 131* 186* 109*    Microbiology: Results for orders placed or performed during the hospital encounter of 06/05/17  MRSA PCR Screening     Status: Abnormal   Collection Time: 06/06/17 11:10 AM  Result Value Ref Range Status   MRSA by PCR POSITIVE (A) NEGATIVE Final    Comment:        The GeneXpert MRSA Assay (FDA approved for NASAL specimens only), is one component of a comprehensive MRSA colonization surveillance program. It is not intended to diagnose MRSA infection nor to guide or monitor treatment for MRSA infections. RESULT CALLED TO, READ BACK BY AND VERIFIED WITH:  Lucious Groves AT 6010 06/06/17 SDR Performed at [facility], [address] [place], [place] [address]     Coagulation Studies: No results for input(s): LABPROT, INR in the last 72 hours.  Urinalysis: No results for input(s): COLORURINE, LABSPEC, PHURINE, GLUCOSEU, HGBUR, BILIRUBINUR, KETONESUR, PROTEINUR, UROBILINOGEN, NITRITE, LEUKOCYTESUR in the last 72 hours.  Invalid input(s): APPERANCEUR    Imaging: No results found.   Medications:   . sodium chloride     . amLODipine  10 mg Oral Daily  . aspirin EC  81 mg Oral Daily  . [START ON 06/07/2017] Chlorhexidine  Gluconate Cloth  6 each Topical [id number]  . cloNIDine  0.1 mg Oral TID  . doxazosin  8 mg Oral QHS  . fentaNYL      . furosemide  40 mg Oral Daily  . heparin  5,000 Units Subcutaneous Q8H  . hydrALAZINE  100 mg Oral TID  . insulin aspart  0-20 Units Subcutaneous TID WC  . insulin aspart  0-5 Units Subcutaneous QHS  . insulin glargine  12 Units Subcutaneous QHS  . mupirocin ointment  1 application Nasal BID  . predniSONE  50 mg Oral Q breakfast  . sodium chloride flush  3 mL Intravenous Q12H  . sodium polystyrene  30 g Oral Once  . tamsulosin  0.4 mg Oral QPC breakfast  . traZODone  100 mg Oral QHS    sodium chloride, acetaminophen **OR** acetaminophen, diphenhydrAMINE, fluticasone, hydrALAZINE, HYDROcodone-acetaminophen, ipratropium-albuterol, ondansetron **OR** ondansetron (ZOFRAN) IV, polyethylene glycol, sodium chloride flush  Assessment/ Plan:  [name] is a 47 y.o. black male  with diabetes mellitus type 2, obstructive sleep apnea, hypertension, hyperlipidemia, morbid obesity, testosterone deficiency, BPH, gout  with chronic kidney disease stage V.   1. Chronic kidney disease stage V: with proteinuria, metabolic acidosis, hyperkalemia, and hyponatremia. Chronic kidney disease secondary to diabetic, hypertensive renal disease and NSAID induced nephropathy  - Discussed hemodialysis. Patient plans on having dialysis at [facility] in [place] [facility]) AVF seems to be not mature on examination. - Consult vascular for tunneled catheter placement.  - Initiate dialysis this admission.  - Place PPD  2. Hypertension: at goal  Home regimen of quinapril, hydralazine, amlodipine, doxazosin, clonidine, tamsulosin, furosemide, and metoprolol - hold quinapril due to angioedema.   3. Diabetes Mellitus type II noninsulin dependent with chronic kidney disease: on glipizide.   4. Secondary Hyperparathyroidism:  PTH elevated at 729 on 05/05/17.  - Check PTH and phosphorus - Continue calcitriol.   5. Anemia of chronic kidney disease: hemoglobin 9.5 - IV iron and epo with Hemodialysis   LOS: 1 Donald Fernandez 2/25/20195:50 PM

## 2017-06-06 NOTE — Progress Notes (Signed)
HD tx start 

## 2017-06-06 NOTE — Consult Note (Signed)
[facility] Consult Note  MRN : [id number]  Donald Fernandez is a 47 y.o. (03-Feb-1971) male who presents with chief complaint of  Chief Complaint  Patient presents with  . Facial Swelling    right sided  .  History of Present Illness: I am asked to see the patient by Dr. Juleen China for evaluation of dialysis access.  He is a long-standing chronic kidney disease patient who has not yet started dialysis.  He had a fistula placed about a year ago.  This was a left radiocephalic AV fistula.  This is not yet been used for dialysis.  It has a weak thrill that does not appears if would be easy to use for dialysis currently.  He is admitted with angioedema and marked swelling.  His renal function has significantly declined and it appears if he needs to start dialysis.  The marked swelling has exacerbated the situation.  Current Facility-Administered Medications  Medication Dose Route Frequency Provider Last Rate Last Dose  . [MAR Hold] 0.9 %  sodium chloride infusion  250 mL Intravenous PRN Salary, Avel Peace, MD      . Doug Sou Hold] acetaminophen (TYLENOL) tablet 650 mg  650 mg Oral Q6H PRN Salary, Montell D, MD       Or  . Doug Sou Hold] acetaminophen (TYLENOL) suppository 650 mg  650 mg Rectal Q6H PRN Salary, Montell D, MD      . Doug Sou Hold] amLODipine (NORVASC) tablet 10 mg  10 mg Oral Daily Salary, Montell D, MD   10 mg at 06/06/17 1113  . [MAR Hold] aspirin EC tablet 81 mg  81 mg Oral Daily Salary, Montell D, MD   81 mg at 06/06/17 1112  . cefUROXime (ZINACEF) 1.5 g in sodium chloride 0.9 % 100 mL IVPB  1.5 g Intravenous 30 min Pre-Op Meghanne Pletz, Erskine Squibb, MD      . Doug Sou Hold] Chlorhexidine Gluconate Cloth 2 % PADS 6 each  6 each Topical Q0600 Hillary Bow, MD      . Doug Sou Hold] cloNIDine (CATAPRES) tablet 0.1 mg  0.1 mg Oral TID Loney Hering D, MD   0.1 mg at 06/06/17 1112  . [MAR Hold] diphenhydrAMINE (BENADRYL) injection 12.5 mg  12.5 mg Intravenous Q6H PRN Salary, Montell  D, MD      . Doug Sou Hold] doxazosin (CARDURA) tablet 8 mg  8 mg Oral QHS Salary, Montell D, MD   8 mg at 06/05/17 2256  . [MAR Hold] fluticasone (FLONASE) 50 MCG/ACT nasal spray 1 spray  1 spray Each Nare Daily PRN Salary, Montell D, MD      . Doug Sou Hold] furosemide (LASIX) tablet 40 mg  40 mg Oral Daily Salary, Montell D, MD   40 mg at 06/06/17 1113  . [MAR Hold] heparin injection 5,000 Units  5,000 Units Subcutaneous Q8H Salary, Montell D, MD   Stopped at 06/06/17 1350  . [MAR Hold] hydrALAZINE (APRESOLINE) injection 10 mg  10 mg Intravenous Q4H PRN Salary, Avel Peace, MD      . Doug Sou Hold] hydrALAZINE (APRESOLINE) tablet 100 mg  100 mg Oral TID Loney Hering D, MD   100 mg at 06/06/17 1112  . [MAR Hold] HYDROcodone-acetaminophen (NORCO/VICODIN) 5-325 MG per tablet 1-2 tablet  1-2 tablet Oral Q4H PRN Salary, Montell D, MD      . Doug Sou Hold] insulin aspart (novoLOG) injection 0-20 Units  0-20 Units Subcutaneous TID WC Salary, Montell D, MD   4 Units at 06/06/17 1311  . [  MAR Hold] insulin aspart (novoLOG) injection 0-5 Units  0-5 Units Subcutaneous QHS Salary, Montell D, MD      . Doug Sou Hold] insulin glargine (LANTUS) injection 12 Units  12 Units Subcutaneous QHS Sudini, Srikar, MD      . Doug Sou Hold] ipratropium-albuterol (DUONEB) 0.5-2.5 (3) MG/3ML nebulizer solution 3 mL  3 mL Nebulization Q4H PRN Salary, Avel Peace, MD      . Doug Sou Hold] mupirocin ointment (BACTROBAN) 2 % 1 application  1 application Nasal BID Hillary Bow, MD   1 application at 25/36/64 1355  . [MAR Hold] ondansetron (ZOFRAN) tablet 4 mg  4 mg Oral Q6H PRN Salary, Montell D, MD       Or  . Doug Sou Hold] ondansetron (ZOFRAN) injection 4 mg  4 mg Intravenous Q6H PRN Salary, Montell D, MD      . Doug Sou Hold] polyethylene glycol (MIRALAX / GLYCOLAX) packet 17 g  17 g Oral Daily PRN Salary, Montell D, MD      . Doug Sou Hold] predniSONE (DELTASONE) tablet 50 mg  50 mg Oral Q breakfast Hillary Bow, MD   Stopped at 06/06/17 1351  . [MAR Hold]  sodium chloride flush (NS) 0.9 % injection 3 mL  3 mL Intravenous Q12H Salary, Montell D, MD   3 mL at 06/06/17 1114  . [MAR Hold] sodium chloride flush (NS) 0.9 % injection 3 mL  3 mL Intravenous PRN Salary, Montell D, MD      . Doug Sou Hold] sodium polystyrene (KAYEXALATE) powder 30 g  30 g Oral Once Hillary Bow, MD      . Doug Sou Hold] tamsulosin (FLOMAX) capsule 0.4 mg  0.4 mg Oral QPC breakfast Salary, Montell D, MD   0.4 mg at 06/06/17 1112  . [MAR Hold] traZODone (DESYREL) tablet 100 mg  100 mg Oral QHS Salary, Montell D, MD   100 mg at 06/05/17 2349    Past Medical History:  Diagnosis Date  . Asthma    triggered by enviornmental allergies  . Diabetes mellitus without complication ([place])   . Hypertension   . Neuropathy   . Renal disorder    CKD4 - not on dialysis  . Restless legs   . Sleep apnea    uses cpap    Past Surgical History:  Procedure Laterality Date  . ANKLE SURGERY    . AV FISTULA PLACEMENT Left 08/04/2016   Procedure: ARTERIOVENOUS (AV) FISTULA CREATION ( RADIOCEPHALIC );  Surgeon: Katha Cabal, MD;  Location: [facility];  Service: Vascular;  Laterality: Left;  . IR ANGIO INTRA EXTRACRAN SEL INTERNAL CAROTID UNI R MOD SED  12/17/2016  . RADIOLOGY WITH ANESTHESIA N/A 12/17/2016   Procedure: Arteriogram/Venogram with right venous sinus manometry;  Surgeon: Consuella Lose, MD;  Location: [place] [facility];  Service: Radiology;  Laterality: N/A;    Social History Social History   Tobacco Use  . Smoking status: Current Every Day Smoker    Types: Cigarettes  . Smokeless tobacco: Never Used  . Tobacco comment: pt refusept says 1 pack of cigarette lasts him 1-2 weeks  Substance Use Topics  . Alcohol use: Yes    Comment: weekends  . Drug use: Yes    Types: Marijuana    Family History Family History  Problem Relation Age of Onset  . AAA (abdominal aortic aneurysm) Mother   No bleeding disorders, clotting disorders, or autoimmune diseases  Allergies  Allergen Reactions   . Accupril [Quinapril Hcl] Anaphylaxis    Angioedema  . Ace Inhibitors Anaphylaxis  Angioedema     REVIEW OF SYSTEMS (Negative unless checked)  Constitutional: [] Weight loss  [] Fever  [] Chills Cardiac: [] Chest pain   [] Chest pressure   [] Palpitations   [] Shortness of breath when laying flat   [x] Shortness of breath at rest   [x] Shortness of breath with exertion. Vascular:  [] Pain in legs with walking   [] Pain in legs at rest   [] Pain in legs when laying flat   [] Claudication   [] Pain in feet when walking  [] Pain in feet at rest  [] Pain in feet when laying flat   [] History of DVT   [] Phlebitis   [x] Swelling in legs   [] Varicose veins   [] Non-healing ulcers Pulmonary:   [] Uses home oxygen   [] Productive cough   [] Hemoptysis   [] Wheeze  [] COPD   [] Asthma Neurologic:  [] Dizziness  [] Blackouts   [] Seizures   [] History of stroke   [] History of TIA  [] Aphasia   [] Temporary blindness   [] Dysphagia   [] Weakness or numbness in arms   [] Weakness or numbness in legs Musculoskeletal:  [] Arthritis   [] Joint swelling   [] Joint pain   [] Low back pain Hematologic:  [] Easy bruising  [] Easy bleeding   [] Hypercoagulable state   [x] Anemic  [] Hepatitis Gastrointestinal:  [] Blood in stool   [] Vomiting blood  [] Gastroesophageal reflux/heartburn   [] Difficulty swallowing. Genitourinary:  [x] Chronic kidney disease   [] Difficult urination  [] Frequent urination  [] Burning with urination   [] Blood in urine Skin:  [] Rashes   [] Ulcers   [] Wounds Psychological:  [] History of anxiety   []  History of major depression.  Physical Examination  Vitals:   06/05/17 2022 06/06/17 0555 06/06/17 1107 06/06/17 1519  BP: 137/80 133/87 123/74 120/73  Pulse: 72 66 80 81  Resp:   18 19  Temp: 98.1 F (36.7 C)  98.2 F (36.8 C) 98.4 F (36.9 C)  TempSrc: Oral  Oral Oral  SpO2: 100% 98% 100% 96%  Weight:    (!) 166.9 kg (368 lb)  Height:    5\' 11"  (1.803 m)   Body mass index is 51.33 kg/m. Gen:  WD/WN, NAD.  Morbidly  obese Head: Facial swelling and periorbital swelling is present, No temporalis wasting.  Ear/Nose/Throat: Hearing grossly intact, nares w/o erythema or drainage, oropharynx w/o Erythema/Exudate Eyes: Sclera non-icteric, conjunctiva clear Neck: Trachea midline.  No JVD.  Pulmonary:  Good air movement, respirations not labored, equal bilaterally.  Cardiac: RRR, normal S1, S2. Vascular: Weak thrill in left forearm AV fistula Vessel Right Left  Radial Palpable Palpable                                    Musculoskeletal: M/S 5/5 throughout.  Extremities without ischemic changes.  Neurologic: Sensation grossly intact in extremities.  Symmetrical.  Speech is fluent. Motor exam as listed above. Psychiatric: Judgment intact, Mood & affect appropriate for pt's clinical situation. Dermatologic: No rashes or ulcers noted.  No cellulitis or open wounds.       CBC Lab Results  Component Value Date   WBC 7.6 06/05/2017   HGB 9.5 (L) 06/05/2017   HCT 28.3 (L) 06/05/2017   MCV 86.0 06/05/2017   PLT 175 06/05/2017    BMET    Component Value Date/Time   NA 129 (L) 06/06/2017 0733   NA 145 12/31/2011 1850   K 5.3 (H) 06/06/2017 0733   K 3.5 12/31/2011 1850   CL 105 06/06/2017 1751  CL 108 (H) 12/31/2011 1850   CO2 12 (L) 06/06/2017 0733   CO2 28 12/31/2011 1850   GLUCOSE 131 (H) 06/06/2017 0733   GLUCOSE 63 (L) 12/31/2011 1850   BUN 91 (H) 06/06/2017 0733   BUN 14 12/31/2011 1850   CREATININE 15.41 (H) 06/06/2017 0733   CREATININE 1.72 (H) 12/31/2011 1850   CALCIUM 8.1 (L) 06/06/2017 0733   CALCIUM 9.0 12/31/2011 1850   GFRNONAA 3 (L) 06/06/2017 0733   GFRNONAA 48 (L) 12/31/2011 1850   GFRAA 4 (L) 06/06/2017 0733   GFRAA 55 (L) 12/31/2011 1850   Estimated Creatinine Clearance: 9.3 mL/min (A) (by C-G formula based on SCr of 15.41 mg/dL (H)).  COAG Lab Results  Component Value Date   INR 0.97 12/17/2016   INR 0.99 07/29/2016   INR 1.06 07/09/2016    Radiology No  results found.    Assessment/Plan 1.  Renal failure.  Now progressed to ESRD.  His fistula does not appear to be usable.  At this point, we need to get a catheter in him to start dialysis immediately.  His GFR is only 4.  As such, we will get a PermCath in him today.  Later this week, we will plan to do a fistulogram for further evaluation to try to get his fistula functional.  Risks and benefits were discussed and he is agreeable to proceed. 2.  Complication of dialysis access.  After arm AV fistula has not matured despite being in place for about a year.  Fistulogram to assess 3.  Hypertension.  Likely an underlying cause of his renal failure and blood pressure control important in reducing the progression of atherosclerotic disease. On appropriate oral medications. 4.  Diabetes.  Likely an underlying cause of his renal failure and blood glucose control important in reducing the progression of atherosclerotic disease. Also, involved in wound healing. On appropriate medications.     Leotis Pain, MD  06/06/2017 4:04 PM    This note was created with Dragon medical transcription system.  Any error is purely unintentional

## 2017-06-06 NOTE — Plan of Care (Addendum)
Nutrition Education Note   RD consulted for Renal Education for 47 yo male admitted 2/24 with angioedema/facial swelling. PMH CKD4/5, HTN, uncontrolled T2DM.   RD identified need for nutrition education for CKD pt new to hemodialysis. Provided "Healthy Eating for Hemodialysis" and "Food Pyramid for Healthy Eating with Kidney Disease" handouts. Reviewed food groups and provided written recommended serving sizes specifically determined for patient's current nutritional status.   Explained why diet restrictions are needed and provided lists of foods to limit/avoid that are high potassium, sodium, and phosphorus. Provided specific recommendations on safer alternatives of these foods. Strongly encouraged compliance of this diet.   Discussed importance of protein intake at each meal and snack. Provided examples of how to maximize protein intake throughout the day. Discussed the need for fluid restriction with dialysis, including current amount of fluid pt has been limited to.  Encouraged pt to take a daily renal multivitamin and explained sources of vitamins in diet.   Encouraged pt to discuss specific diet questions/concerns with RD at HD outpatient facility. Teach back method used.  Expect fair compliance.  Body mass index is 55.25 kg/m. Pt meets criteria for obesity III based on current BMI.  Current diet order is NPO pending placement of HD permacath, patient was consuming approximately 100% of meals prior to NPO. Labs and medications reviewed. No further nutrition interventions warranted at this time. RD contact information provided. If additional nutrition issues arise, please re-consult RD.   Per ED note, facial swelling causing difficulties with PO intake, "States he was having difficulty chewing and swallowing food but has no difficulty breathing."  However, pt reports being hungry currently and denies any difficulties eating. Reports only eating 1 meal per day (fast food hamburger and  fries); but changed his recall information. Likely unreliable food recall Pt reports that he only drinks Diet Mt Dew and [employer] regular lemonade.   Medications:  Pepcid Lasix Glipizide Insulin RS, HS Lantus Prednisone traZODone (DESYREL) (Side effects include hyponatremia)  Labs:  CBGs 165, 131, 186 Na 129 (L) K 5.3 (H) CO2 12 (L) BUN 91 (H) Cr 15.41 (H) GFR 3 (LL)  MRSA positive (as of 2/25)     Edmonia Lynch, MS Dietetic Intern Pager: [phone]

## 2017-06-06 NOTE — Progress Notes (Signed)
Donald Fernandez NAME: Donald Fernandez    MR#:  [id number]  DATE OF BIRTH:  February 23, 1971  SUBJECTIVE:  CHIEF COMPLAINT:   Chief Complaint  Patient presents with  . Facial Swelling    right sided   Right-sided facial swelling is improved.  No shortness of breath.  REVIEW OF SYSTEMS:    Review of Systems  Constitutional: Negative for chills and fever.  HENT: Negative for sore throat.   Eyes: Negative for blurred vision, double vision and pain.  Respiratory: Negative for cough, hemoptysis, shortness of breath and wheezing.   Cardiovascular: Negative for chest pain, palpitations, orthopnea and leg swelling.  Gastrointestinal: Negative for abdominal pain, constipation, diarrhea, heartburn, nausea and vomiting.  Genitourinary: Negative for dysuria and hematuria.  Musculoskeletal: Negative for back pain and joint pain.  Skin: Negative for rash.  Neurological: Negative for sensory change, speech change, focal weakness and headaches.  Endo/Heme/Allergies: Does not bruise/bleed easily.  Psychiatric/Behavioral: Negative for depression. The patient is not nervous/anxious.     DRUG ALLERGIES:   Allergies  Allergen Reactions  . Accupril [Quinapril Hcl] Anaphylaxis    Angioedema  . Ace Inhibitors Anaphylaxis    Angioedema    VITALS:  Blood pressure 123/74, pulse 80, temperature 98.2 F (36.8 C), temperature source Oral, resp. rate 18, height 5\' 11"  (1.803 m), weight (!) 167 kg (368 lb 2.7 oz), SpO2 100 %.  PHYSICAL EXAMINATION:   Physical Exam  GENERAL:  47 y.o.-year-old patient lying in the bed with no acute distress. Obese EYES: Pupils equal, round, reactive to light and accommodation. No scleral icterus. Extraocular muscles intact.  HEENT: Head atraumatic, normocephalic. Oropharynx and nasopharynx clear.  NECK:  Supple, no jugular venous distention. No thyroid enlargement, no tenderness.  LUNGS: Normal breath sounds bilaterally, no  wheezing, rales, rhonchi. No use of accessory muscles of respiration.  CARDIOVASCULAR: S1, S2 normal. No murmurs, rubs, or gallops.  ABDOMEN: Soft, nontender, nondistended. Bowel sounds present. No organomegaly or mass.  EXTREMITIES: No cyanosis, clubbing or edema b/l.    NEUROLOGIC: Cranial nerves II through XII are intact. No focal Motor or sensory deficits b/l.   PSYCHIATRIC: The patient is alert and oriented x 3.  SKIN: No obvious rash, lesion, or ulcer.   LABORATORY PANEL:   CBC Recent Labs  Lab 06/05/17 1651  WBC 7.6  HGB 9.5*  HCT 28.3*  PLT 175   ------------------------------------------------------------------------------------------------------------------ Chemistries  Recent Labs  Lab 06/05/17 1651 06/06/17 0733  NA 135 129*  K 4.5 5.3*  CL 106 105  CO2 15* 12*  GLUCOSE 140* 131*  BUN 87* 91*  CREATININE 14.40* 15.41*  CALCIUM 8.5* 8.1*  AST 18  --   ALT 15*  --   ALKPHOS 73  --   BILITOT 0.7  --    ------------------------------------------------------------------------------------------------------------------  Cardiac Enzymes No results for input(s): TROPONINI in the last 168 hours. ------------------------------------------------------------------------------------------------------------------  RADIOLOGY:  No results found.   ASSESSMENT AND PLAN:   *Acute kidney injury over CKD stage V. Low bicarb.  Hyperkalemia.  Discussed with nephrology.  Start inpatient hemodialysis.  *Acute angioedema.  Likely due to quinapril.  Stop.  Improved.  Change IV Solu-Medrol to prednisone.  *Sleep apnea.  CPAP at night.  *Diabetes mellitus.  Sliding scale insulin.  *Essential hypertension.  Quinapril stopped.  In the normal range at this time.  *Anemia of chronic disease.  All the records are reviewed and case discussed with Care Management/Social Workerr. Management plans discussed  with the patient, family and they are in agreement.  CODE STATUS:  FULL CODE  DVT Prophylaxis: SCDs  TOTAL TIME TAKING CARE OF THIS PATIENT: 35 minutes.   POSSIBLE D/C IN 2-3 DAYS, DEPENDING ON CLINICAL CONDITION.  Donald Fernandez M.D on 06/06/2017 at 1:19 PM  Between 7am to 6pm - Pager - [phone]  After 6pm go to [email or web address] - password [email or web address] [facility]  Office  [phone]  CC: Primary care physician; Denton Lank, MD  Note: This dictation was prepared with Dragon dictation along with smaller phrase technology. Any transcriptional errors that result from this process are unintentional.

## 2017-06-06 NOTE — Op Note (Signed)
OPERATIVE NOTE    PRE-OPERATIVE DIAGNOSIS: 1. ESRD 2.  Angioedema 3.  Non-mature left arm AV fistula  POST-OPERATIVE DIAGNOSIS: same as above  PROCEDURE: 1. Ultrasound guidance for vascular access to the right internal jugular vein 2. Catheter placement into superior vena cava and superior venacavogram 3. Fluoroscopic guidance for placement of catheter 4. Placement of a 19 cm tip to cuff tunneled hemodialysis catheter via the right internal jugular vein  SURGEON: Leotis Pain, MD  ANESTHESIA:  Local with Moderate conscious sedation for approximately 30 minutes using 3 mg of Versed and 100 Mcg of Fentanyl  ESTIMATED BLOOD LOSS: 75 cc  FLUORO TIME: 3.3 minutes  CONTRAST: 10 cc  FINDING(S): 1.  Patent right internal jugular vein and superior vena cava with a large azygos vein SVC stenosis  SPECIMEN(S):  None  INDICATIONS:   Donald Fernandez is a 47 y.o.male who presents with renal failure and a left arm AV fistula which was not yet mature.  The patient needs long term dialysis access for their ESRD, and a Permcath is necessary.  Risks and benefits are discussed and informed consent is obtained.    DESCRIPTION: After obtaining full informed written consent, the patient was brought back to the vascular suited. The patient's right neck and chest were sterilely prepped and draped in a sterile surgical field was created. Moderate conscious sedation was administered during a face to face encounter with the patient throughout the procedure with my supervision of the RN administering medicines and monitoring the patient's vital signs, pulse oximetry, telemetry and mental status throughout from the start of the procedure until the patient was taken to the recovery room.  The right internal jugular vein was visualized with ultrasound and found to be patent. It was then accessed under direct ultrasound guidance and a permanent image was recorded. A wire was placed. After skin nick and dilatation,  the peel-away sheath was placed over the wire.  The wire kept traversing more medially before going into the right atrium and did not appear to take atypical direction.  I then placed a 40 cm Kumpe catheter over the J-wire into the superior vena cava and performed imaging.  This showed a patent superior vena cava without an obvious stenosis.  There was a large azygos vein system that the wire was initially passing down.  After this image, I was able to pass a wire down through the true superior vena cava and into the right atrium. I then turned my attention to an area under the clavicle. Approximately 1-2 fingerbreadths below the clavicle a small counterincision was created and tunneled from the subclavicular incision to the access site. Using fluoroscopic guidance, a 19 centimeter tip to cuff tunneled hemodialysis catheter was selected, and tunneled from the subclavicular incision to the access site. It was then placed through the peel-away sheath and the peel-away sheath was removed. Using fluoroscopic guidance the catheter tips were parked in the right atrium. The appropriate distal connectors were placed. It withdrew blood well and flushed easily with heparinized saline and a concentrated heparin solution was then placed. It was secured to the chest wall with 2 Prolene sutures. The access incision was closed single 4-0 Monocryl. A 4-0 Monocryl pursestring suture was placed around the exit site. Sterile dressings were placed. The patient tolerated the procedure well and was taken to the recovery room in stable condition.  COMPLICATIONS: None  CONDITION: Stable  Leotis Pain, MD 06/06/2017 4:44 PM   This note was created with [facility]  transcription system. Any errors in dictation are purely unintentional.

## 2017-06-07 ENCOUNTER — Encounter: Payer: Self-pay | Admitting: Vascular Surgery

## 2017-06-07 LAB — GLUCOSE, CAPILLARY
GLUCOSE-CAPILLARY: 119 mg/dL — AB (ref 65–99)
GLUCOSE-CAPILLARY: 136 mg/dL — AB (ref 65–99)
Glucose-Capillary: 145 mg/dL — ABNORMAL HIGH (ref 65–99)
Glucose-Capillary: 182 mg/dL — ABNORMAL HIGH (ref 65–99)
Glucose-Capillary: 159 mg/dL — ABNORMAL HIGH (ref 65–99)

## 2017-06-07 LAB — HEMOGLOBIN A1C
Hgb A1c MFr Bld: 5.1 % (ref 4.8–5.6)
Mean Plasma Glucose: 100 mg/dL

## 2017-06-07 LAB — COMPREHENSIVE METABOLIC PANEL
ALBUMIN: 3.4 g/dL — AB (ref 3.5–5.0)
ALT: 14 U/L — AB (ref 17–63)
AST: 19 U/L (ref 15–41)
Alkaline Phosphatase: 67 U/L (ref 38–126)
Anion gap: 13 (ref 5–15)
BUN: 80 mg/dL — AB (ref 6–20)
CHLORIDE: 105 mmol/L (ref 101–111)
CO2: 19 mmol/L — AB (ref 22–32)
CREATININE: 12.73 mg/dL — AB (ref 0.61–1.24)
Calcium: 7.9 mg/dL — ABNORMAL LOW (ref 8.9–10.3)
GFR calc Af Amer: 5 mL/min — ABNORMAL LOW (ref >60)
GFR calc non Af Amer: 4 mL/min — ABNORMAL LOW (ref >60)
GLUCOSE: 91 mg/dL (ref 65–99)
Potassium: 3.7 mmol/L (ref 3.5–5.1)
Sodium: 137 mmol/L (ref 135–145)
Total Bilirubin: 0.4 mg/dL (ref 0.3–1.2)
Total Protein: 6.4 g/dL — ABNORMAL LOW (ref 6.5–8.1)

## 2017-06-07 LAB — PHOSPHORUS: Phosphorus: 5.3 mg/dL — ABNORMAL HIGH (ref 2.5–4.6)

## 2017-06-07 MED ORDER — TUBERCULIN PPD 5 UNIT/0.1ML ID SOLN
5.0000 [IU] | Freq: Once | INTRADERMAL | Status: AC
  Administered 2017-06-07: 5 [IU] via INTRADERMAL
  Filled 2017-06-07: qty 0.1

## 2017-06-07 NOTE — Progress Notes (Signed)
Post HD Assessment 

## 2017-06-07 NOTE — Progress Notes (Signed)
Pre HD Assessment  

## 2017-06-07 NOTE — Care Management (Signed)
Notified Estill Bamberg with [facility] of dialysis.

## 2017-06-07 NOTE — Progress Notes (Signed)
Post HD Tx  - pt tolerated tx well, net UF 28mL, pt stable for transport denies complaints. Report given to floor RN.

## 2017-06-07 NOTE — Progress Notes (Signed)
[place] [facility] [place] NAME: Donald Fernandez    MR#:  [id number]  DATE OF BIRTH:  Aug 31, 1970  SUBJECTIVE:  CHIEF COMPLAINT:   Chief Complaint  Patient presents with  . Facial Swelling    right sided   Right-sided facial swelling is resolved.  No shortness of breath.  Patient had hemodialysis yesterday and today.  REVIEW OF SYSTEMS:    Review of Systems  Constitutional: Negative for chills and fever.  HENT: Negative for sore throat.   Eyes: Negative for blurred vision, double vision and pain.  Respiratory: Negative for cough, hemoptysis, shortness of breath and wheezing.   Cardiovascular: Negative for chest pain, palpitations, orthopnea and leg swelling.  Gastrointestinal: Negative for abdominal pain, constipation, diarrhea, heartburn, nausea and vomiting.  Genitourinary: Negative for dysuria and hematuria.  Musculoskeletal: Negative for back pain and joint pain.  Skin: Negative for rash.  Neurological: Negative for sensory change, speech change, focal weakness and headaches.  Endo/Heme/Allergies: Does not bruise/bleed easily.  Psychiatric/Behavioral: Negative for depression. The patient is not nervous/anxious.     DRUG ALLERGIES:   Allergies  Allergen Reactions  . Accupril [Quinapril Hcl] Anaphylaxis    Angioedema  . Ace Inhibitors Anaphylaxis    Angioedema    VITALS:  Blood pressure 104/77, pulse 77, temperature 98 F (36.7 C), temperature source Oral, resp. rate 17, height 5\' 11"  (1.803 m), weight (!) 169.9 kg (374 lb 9 oz), SpO2 100 %.  PHYSICAL EXAMINATION:   Physical Exam  GENERAL:  47 y.o.-year-old patient lying in the bed with no acute distress. Obese EYES: Pupils equal, round, reactive to light and accommodation. No scleral icterus. Extraocular muscles intact.  HEENT: Head atraumatic, normocephalic. Oropharynx and nasopharynx clear.  NECK:  Supple, no jugular venous distention. No thyroid enlargement, no tenderness.   LUNGS: Normal breath sounds bilaterally, no wheezing, rales, rhonchi. No use of accessory muscles of respiration.  CARDIOVASCULAR: S1, S2 normal. No murmurs, rubs, or gallops.  ABDOMEN: Soft, nontender, nondistended. Bowel sounds present. No organomegaly or mass.  EXTREMITIES: No cyanosis, clubbing or edema b/l.    NEUROLOGIC: Cranial nerves II through XII are intact. No focal Motor or sensory deficits b/l.   PSYCHIATRIC: The patient is alert and oriented x 3.  SKIN: No obvious rash, lesion, or ulcer.   LABORATORY PANEL:   CBC Recent Labs  Lab 06/05/17 1651  WBC 7.6  HGB 9.5*  HCT 28.3*  PLT 175   ------------------------------------------------------------------------------------------------------------------ Chemistries  Recent Labs  Lab 06/07/17 0842  NA 137  K 3.7  CL 105  CO2 19*  GLUCOSE 91  BUN 80*  CREATININE 12.73*  CALCIUM 7.9*  AST 19  ALT 14*  ALKPHOS 67  BILITOT 0.4   ------------------------------------------------------------------------------------------------------------------  Cardiac Enzymes No results for input(s): TROPONINI in the last 168 hours. ------------------------------------------------------------------------------------------------------------------  RADIOLOGY:  No results found.   ASSESSMENT AND PLAN:   *Acute kidney injury over CKD stage V. Discussed with Dr. Juleen China  Temporary dialysis catheter placed.  Started on hemodialysis.  *Acute angioedema.  Likely due to quinapril.  Stop.  Improved.  Change IV Solu-Medrol to prednisone.  *Sleep apnea.  CPAP at night.  *Diabetes mellitus.  Sliding scale insulin.  *Essential hypertension.  Quinapril stopped.  Will stop hydralazine and clonidine.   *Anemia of chronic disease.  All the records are reviewed and case discussed with Care Management/Social Workerr. Management plans discussed with the patient, family and they are in agreement.  CODE STATUS: FULL CODE  DVT  Prophylaxis: SCDs  TOTAL TIME TAKING CARE OF THIS PATIENT: 35 minutes.   POSSIBLE D/C IN 2-3 DAYS, DEPENDING ON CLINICAL CONDITION.  Donald Fernandez M.D on 06/07/2017 at 3:18 PM  Between 7am to 6pm - Pager - [phone]  After 6pm go to [email or web address] - password [email or web address] [facility]  Office  [phone]  CC: Primary care physician; Denton Lank, MD  Note: This dictation was prepared with Dragon dictation along with smaller phrase technology. Any transcriptional errors that result from this process are unintentional.

## 2017-06-07 NOTE — Progress Notes (Signed)
[facility]  ROUNDING NOTE   Subjective:   First hemodialysis treatment yesterday. Seen and examined on secondary hemodialysis treatment. Tolerating treatment well. RIJ permcath.     HEMODIALYSIS FLOWSHEET:  Blood Flow Rate (mL/min): 250 mL/min Arterial Pressure (mmHg): -90 mmHg Venous Pressure (mmHg): 70 mmHg Transmembrane Pressure (mmHg): 50 mmHg Ultrafiltration Rate (mL/min): 250 mL/min Dialysate Flow Rate (mL/min): 500 ml/min Conductivity: Machine : 14 Conductivity: Machine : 14 Dialysis Fluid Bolus: Normal Saline Bolus Amount (mL): 250 mL    Objective:  Vital signs in last 24 hours:  Temp:  [97.8 F (36.6 C)-98.7 F (37.1 C)] 98 F (36.7 C) (02/26 1408) Pulse Rate:  [67-87] 77 (02/26 1408) Resp:  [10-23] 17 (02/26 1432) BP: (101-160)/(61-90) 104/77 (02/26 1408) SpO2:  [96 %-100 %] 100 % (02/26 1432) Weight:  [166.9 kg (368 lb)-169.9 kg (374 lb 9 oz)] 169.9 kg (374 lb 9 oz) (02/26 0930)  Weight change: -5.443 kg () Filed Weights   06/06/17 1519 06/06/17 2005 06/07/17 0930  Weight: (!) 166.9 kg (368 lb) (!) 169.7 kg (374 lb 1.9 oz) (!) 169.9 kg (374 lb 9 oz)    Intake/Output: I/O last 3 completed shifts: In: 42 [P.O.:360; IV Piggyback:100] Out: -    Intake/Output this shift:  Total I/O In: -  Out: 25 [Other:25]  Physical Exam: General: NAD,   Head: +lip and tongue edema  Eyes: Anicteric, PERRL  Neck: Supple, trachea midline  Lungs:  Clear to auscultation  Heart: Regular rate and rhythm  Abdomen:  Soft, nontender,   Extremities: trace peripheral edema.  Neurologic: Nonfocal, moving all four extremities  Skin: No lesions  Access: Left radiocephalic AVF, right IJ permcath 6/22    Basic Metabolic Panel: Recent Labs  Lab 06/05/17 1651 06/06/17 0733 06/07/17 0842  NA 135 129* 137  K 4.5 5.3* 3.7  CL 106 105 105  CO2 15* 12* 19*  GLUCOSE 140* 131* 91  BUN 87* 91* 80*  CREATININE 14.40* 15.41* 12.73*  CALCIUM 8.5* 8.1* 7.9*   PHOS  --   --  5.3*    Liver Function Tests: Recent Labs  Lab 06/05/17 1651 06/07/17 0842  AST 18 19  ALT 15* 14*  ALKPHOS 73 67  BILITOT 0.7 0.4  PROT 7.2 6.4*  ALBUMIN 3.7 3.4*   No results for input(s): LIPASE, AMYLASE in the last 168 hours. No results for input(s): AMMONIA in the last 168 hours.  CBC: Recent Labs  Lab 06/05/17 1651  WBC 7.6  HGB 9.5*  HCT 28.3*  MCV 86.0  PLT 175    Cardiac Enzymes: No results for input(s): CKTOTAL, CKMB, CKMBINDEX, TROPONINI in the last 168 hours.  BNP: Invalid input(s): POCBNP  CBG: Recent Labs  Lab 06/06/17 1700 06/06/17 1754 06/06/17 2305 06/07/17 0759 06/07/17 1404  GLUCAP 109* 169* 181* 119* 182*    Microbiology: Results for orders placed or performed during the hospital encounter of 06/05/17  MRSA PCR Screening     Status: Abnormal   Collection Time: 06/06/17 11:10 AM  Result Value Ref Range Status   MRSA by PCR POSITIVE (A) NEGATIVE Final    Comment:        The GeneXpert MRSA Assay (FDA approved for NASAL specimens only), is one component of a comprehensive MRSA colonization surveillance program. It is not intended to diagnose MRSA infection nor to guide or monitor treatment for MRSA infections. RESULT CALLED TO, READ BACK BY AND VERIFIED WITH:  Lucious Groves AT [id number] 06/06/17 SDR Performed at [facility], [address] [place],  [address]     Coagulation Studies: No results for input(s): LABPROT, INR in the last 72 hours.  Urinalysis: No results for input(s): COLORURINE, LABSPEC, PHURINE, GLUCOSEU, HGBUR, BILIRUBINUR, KETONESUR, PROTEINUR, UROBILINOGEN, NITRITE, LEUKOCYTESUR in the last 72 hours.  Invalid input(s): APPERANCEUR    Imaging: No results found.   Medications:   . sodium chloride     . amLODipine  10 mg Oral Daily  . aspirin EC  81 mg Oral Daily  . Chlorhexidine Gluconate Cloth  6 each Topical Q0600  . doxazosin  8 mg Oral QHS  . furosemide  40 mg Oral  Daily  . heparin  5,000 Units Subcutaneous Q8H  . hydrALAZINE  100 mg Oral TID  . insulin aspart  0-20 Units Subcutaneous TID WC  . insulin aspart  0-5 Units Subcutaneous QHS  . insulin glargine  12 Units Subcutaneous QHS  . mupirocin ointment  1 application Nasal BID  . sodium chloride flush  3 mL Intravenous Q12H  . sodium polystyrene  30 g Oral Once  . tamsulosin  0.4 mg Oral QPC breakfast  . traZODone  100 mg Oral QHS  . tuberculin  5 Units Intradermal Once   sodium chloride, acetaminophen **OR** acetaminophen, diphenhydrAMINE, fluticasone, hydrALAZINE, HYDROcodone-acetaminophen, ipratropium-albuterol, ondansetron **OR** ondansetron (ZOFRAN) IV, polyethylene glycol, sodium chloride flush  Assessment/ Plan:  [name] is a 47 y.o. black male  with diabetes mellitus type 2, obstructive sleep apnea, hypertension, hyperlipidemia, morbid obesity, testosterone deficiency, BPH, gout  with chronic kidney disease stage V.   1. End stage renal disease: progression from stage V chronic kidney disease ESRD secondary to diabetic nephropathy, hypertensive renal disease and NSAID induced nephropathy  Outpatient planning for  [employer] [facility] in [place]) AVF seems to be not mature on examination. - vascular to have fistulogram tomorrow.  - Initiate dialysis this admission. First treatment on 2/25.   2. Hypertension: at goal  Home regimen of quinapril, hydralazine, amlodipine, doxazosin, clonidine, tamsulosin, furosemide, and metoprolol - hold quinapril due to angioedema.   3. Diabetes Mellitus type II noninsulin dependent with chronic kidney disease: on glipizide.   4. Secondary Hyperparathyroidism:  PTH elevated at 729 on 05/05/17. Phosphorus at goal.   - Continue calcitriol.   5. Anemia of chronic kidney disease: hemoglobin 9.5 - IV iron and epo with Hemodialysis.   LOS: 2 Vassie Kugel 2/26/20193:00 PM

## 2017-06-08 ENCOUNTER — Encounter: Payer: Self-pay | Admitting: Vascular Surgery

## 2017-06-08 ENCOUNTER — Encounter
Admission: EM | Disposition: A | Discharge status: Home / Self Care | Payer: Self-pay | Source: Home / Self Care | Attending: Internal Medicine

## 2017-06-08 DIAGNOSIS — N186 End stage renal disease: Secondary | ICD-10-CM | POA: Diagnosis not present

## 2017-06-08 DIAGNOSIS — T82868A Thrombosis of vascular prosthetic devices, implants and grafts, initial encounter: Secondary | ICD-10-CM | POA: Diagnosis not present

## 2017-06-08 HISTORY — PX: A/V FISTULAGRAM: CATH118298

## 2017-06-08 LAB — GLUCOSE, CAPILLARY
GLUCOSE-CAPILLARY: 95 mg/dL (ref 65–99)
GLUCOSE-CAPILLARY: 117 mg/dL — AB (ref 65–99)
Glucose-Capillary: 75 mg/dL (ref 65–99)
Glucose-Capillary: 134 mg/dL — ABNORMAL HIGH (ref 65–99)

## 2017-06-08 LAB — PTH, INTACT AND CALCIUM
CALCIUM TOTAL (PTH): 8.1 mg/dL — AB (ref 8.7–10.2)
PTH: 46 pg/mL (ref 15–65)

## 2017-06-08 LAB — HEPATITIS B SURFACE ANTIGEN: Hepatitis B Surface Ag: NEGATIVE

## 2017-06-08 LAB — HEPATITIS B CORE ANTIBODY, IGM: Hep B C IgM: NEGATIVE

## 2017-06-08 SURGERY — A/V FISTULAGRAM
Anesthesia: Moderate Sedation

## 2017-06-08 MED ORDER — DIPHENHYDRAMINE HCL 50 MG/ML IJ SOLN
INTRAMUSCULAR | Status: DC | PRN
  Administered 2017-06-08: 25 mg via INTRAVENOUS

## 2017-06-08 MED ORDER — CEFAZOLIN SODIUM-DEXTROSE 1-4 GM/50ML-% IV SOLN
1.0000 g | Freq: Once | INTRAVENOUS | Status: AC
  Administered 2017-06-08: 1 g via INTRAVENOUS
  Filled 2017-06-08: qty 50

## 2017-06-08 MED ORDER — FENTANYL CITRATE (PF) 100 MCG/2ML IJ SOLN
INTRAMUSCULAR | Status: AC
  Filled 2017-06-08: qty 2

## 2017-06-08 MED ORDER — LIDOCAINE-EPINEPHRINE (PF) 1 %-1:200000 IJ SOLN
INTRAMUSCULAR | Status: AC
Start: 2017-06-08 — End: 2017-06-08
  Filled 2017-06-08: qty 30

## 2017-06-08 MED ORDER — CLONIDINE HCL 0.1 MG PO TABS
0.1000 mg | ORAL_TABLET | Freq: Two times a day (BID) | ORAL | Status: DC
  Administered 2017-06-08: 22:00:00 0.1 mg via ORAL
  Filled 2017-06-08: qty 1

## 2017-06-08 MED ORDER — AMLODIPINE BESYLATE 5 MG PO TABS: 2.5000 mg | ORAL_TABLET | Freq: Every day | ORAL | Status: DC

## 2017-06-08 MED ORDER — MIDAZOLAM HCL 2 MG/2ML IJ SOLN
INTRAMUSCULAR | Status: DC | PRN
Start: 2017-06-08 — End: 2017-06-08
  Administered 2017-06-08: 1 mg via INTRAVENOUS
  Administered 2017-06-08: 2 mg via INTRAVENOUS
  Administered 2017-06-08: 1 mg via INTRAVENOUS

## 2017-06-08 MED ORDER — DIPHENHYDRAMINE HCL 50 MG/ML IJ SOLN
INTRAMUSCULAR | Status: AC
  Filled 2017-06-08: qty 1

## 2017-06-08 MED ORDER — EPOETIN ALFA 10000 UNIT/ML IJ SOLN
10000.0000 [IU] | INTRAMUSCULAR | Status: DC
  Filled 2017-06-08: qty 1

## 2017-06-08 MED ORDER — CEFAZOLIN SODIUM-DEXTROSE 1-4 GM/50ML-% IV SOLN
1.0000 g | Freq: Three times a day (TID) | INTRAVENOUS | Status: DC
  Administered 2017-06-09: 1 g via INTRAVENOUS
  Filled 2017-06-08 (×6): qty 50

## 2017-06-08 MED ORDER — IOPAMIDOL (ISOVUE-300) INJECTION 61%
INTRAVENOUS | Status: DC | PRN
  Administered 2017-06-08: 30 mL via INTRA_ARTERIAL

## 2017-06-08 MED ORDER — MIDAZOLAM HCL 5 MG/5ML IJ SOLN
INTRAMUSCULAR | Status: AC
  Filled 2017-06-08: qty 5

## 2017-06-08 MED ORDER — TRAZODONE HCL 100 MG PO TABS: 100.0000 mg | ORAL_TABLET | Freq: Every day | ORAL | Status: DC

## 2017-06-08 MED ORDER — METOPROLOL SUCCINATE ER 50 MG PO TB24
100.0000 mg | ORAL_TABLET | Freq: Every day | ORAL | Status: DC
  Administered 2017-06-08 – 2017-06-09 (×2): 100 mg via ORAL
  Filled 2017-06-08 (×2): qty 2
  Filled 2017-06-08 (×3): qty 1

## 2017-06-08 MED ORDER — HEPARIN SODIUM (PORCINE) 1000 UNIT/ML IJ SOLN
INTRAMUSCULAR | Status: DC | PRN
  Administered 2017-06-08: 4000 [IU] via INTRAVENOUS

## 2017-06-08 MED ORDER — FENTANYL CITRATE (PF) 100 MCG/2ML IJ SOLN
INTRAMUSCULAR | Status: DC | PRN
  Administered 2017-06-08 (×3): 50 ug via INTRAVENOUS

## 2017-06-08 MED ORDER — SODIUM CHLORIDE 0.9 % IV SOLN
100.0000 mg | INTRAVENOUS | Status: DC
  Filled 2017-06-08 (×2): qty 5

## 2017-06-08 MED ORDER — TOPIRAMATE 25 MG PO TABS
25.0000 mg | ORAL_TABLET | Freq: Every day | ORAL | Status: DC
  Administered 2017-06-08 – 2017-06-09 (×2): 25 mg via ORAL
  Filled 2017-06-08 (×2): qty 1

## 2017-06-08 MED ORDER — HEPARIN SODIUM (PORCINE) 1000 UNIT/ML IJ SOLN
INTRAMUSCULAR | Status: AC
Start: 2017-06-08 — End: 2017-06-08
  Filled 2017-06-08: qty 1

## 2017-06-08 MED ORDER — SODIUM CHLORIDE 0.9 % IV SOLN
INTRAVENOUS | Status: DC
  Administered 2017-06-08: 09:00:00 via INTRAVENOUS

## 2017-06-08 SURGICAL SUPPLY — 19 items
BALLN DORADO 5X200X135 (BALLOONS) ×3
BALLN LUTONIX 018 5X150X130 (BALLOONS) ×3
BALLN ULTRVRSE 4X220X150 (BALLOONS) ×2
BALLN ULTRVRSE 4X220X150 OTW (BALLOONS) ×1
BALLOON DORADO 5X200X135 (BALLOONS) ×1 IMPLANT
BALLOON LUTONIX 018 5X150X130 (BALLOONS) ×1 IMPLANT
BALLOON ULTRVRSE 4X220X150 OTW (BALLOONS) ×1 IMPLANT
CANNULA 5F STIFF (CANNULA) ×3 IMPLANT
CATH BEACON 5 .035 40 KMP TP (CATHETERS) ×1 IMPLANT
CATH BEACON 5 .038 40 KMP TP (CATHETERS) ×2
DEVICE PRESTO INFLATION (MISCELLANEOUS) ×3 IMPLANT
DEVICE TORQUE (MISCELLANEOUS) ×3 IMPLANT
DRAPE BRACHIAL (DRAPES) ×3 IMPLANT
GLIDEWIRE STIFF .35X180X3 HYDR (WIRE) ×3 IMPLANT
PACK ANGIOGRAPHY (CUSTOM PROCEDURE TRAY) ×3 IMPLANT
SHEATH BRITE TIP 6FRX5.5 (SHEATH) ×3 IMPLANT
SUT MNCRL AB 4-0 PS2 18 (SUTURE) ×3 IMPLANT
TOWEL OR 17X26 4PK STRL BLUE (TOWEL DISPOSABLE) ×3 IMPLANT
WIRE G 018X200 V18 (WIRE) ×3 IMPLANT

## 2017-06-08 NOTE — Progress Notes (Signed)
Pre HD assessment  

## 2017-06-08 NOTE — Progress Notes (Signed)
Post HD assessment. Pt tolerated tx well without c/o or complications. Goal met, net UF 1020.

## 2017-06-08 NOTE — H&P (Signed)
[facility] History & Physical Update  The patient was interviewed and re-examined.  The patient's previous History and Physical has been reviewed and is unchanged.  There is no change in the plan of care. We plan to proceed with the scheduled procedure of fistulagram to see if we can help the maturation of his AVF.  Leotis Pain, MD  06/08/2017, 9:11 AM

## 2017-06-08 NOTE — Progress Notes (Signed)
HD tx start 

## 2017-06-08 NOTE — Progress Notes (Signed)
HD tx end  

## 2017-06-08 NOTE — Care Management Important Message (Signed)
Important Message  Patient Details  Name: Donald Fernandez MRN: [id number] Date of Birth: 1970-08-15   Medicare Important Message Given:  Yes    Shelbie Ammons, RN 06/08/2017, 7:49 AM

## 2017-06-08 NOTE — Op Note (Signed)
[facility]    OPERATIVE NOTE   PROCEDURE: 1.   Left radiocephalic arteriovenous fistula cannulation under ultrasound guidance in a cephalic vein branch 2.   Left arm fistulagram including central venogram 3.   PTA of left forearm cephalic vein with 4 and 5 mm angioplasty balloons  PRE-OPERATIVE DIAGNOSIS: 1. ESRD 2. Poorly functional left radiocephalic AVF  POST-OPERATIVE DIAGNOSIS: same as above   SURGEON: Leotis Pain, MD  ANESTHESIA: local with MCS  ESTIMATED BLOOD LOSS: 10 cc  FINDING(S): 1. Forearm cephalic vein occlusion, small cephalic vein branch in the forearm that would not support fistula flow. Upper arm drainage through the basilic vein. Central veins difficult to opacify due to the poor flow  SPECIMEN(S):  None  CONTRAST: 45 cc  FLUORO TIME: 9 minutes  MODERATE CONSCIOUS SEDATION TIME: Approximately 45 minutes with 4 mg of Versed and 150 mcg of Fentanyl   INDICATIONS: Donald Fernandez is a 47 y.o. male who presents with malfunctioning left radiocephalic arteriovenous fistula.  The patient is scheduled for left arm fistulagram.  The patient is aware the risks include but are not limited to: bleeding, infection, thrombosis of the cannulated access, and possible anaphylactic reaction to the contrast.  The patient is aware of the risks of the procedure and elects to proceed forward.  DESCRIPTION: After full informed written consent was obtained, the patient was brought back to the angiography suite and placed supine upon the angiography table.  The patient was connected to monitoring equipment. Moderate conscious sedation was administered with a face to face encounter with the patient throughout the procedure with my supervision of the RN administering medicines and monitoring the patient's vital signs and mental status throughout from the start of the procedure until the patient was taken to the recovery room. The left arm was prepped and draped in  the standard fashion for a percutaneous access intervention.  Under ultrasound guidance, the left radiocephalic arteriovenous fistula appeared to have occlusion of the cephalic vein throughout much of the forearm.  There was a small to medium size range that started about 5 cm beyond the anastomosis and traversed medially toward the basilic vein system.  It was unclear by ultrasound if this would be large enough to support fistula flow but I elected to access this for full evaluation.  This forearm cephalic vein branch the was cannulated with a micropuncture needle under direct ultrasound guidance in a retrograde fashion and a permanent image was performed.  The microwire was advanced into the vein branch and the needle was exchanged for the a microsheath.  I then upsized to a 6 Fr Sheath and imaging was performed.  Hand injections were completed to image the access including the central venous system. This demonstrated the forearm cephalic vein branch to be very small and tortuous and clearly not large enough to support fistula flow and use for dialysis.  There is also about an 85-90% stenosis 2-3 cm beyond the anastomosis within the true cephalic vein.  The mid and proximal upper arm cephalic vein was occluded.  The majority of the upper arm outflow was through the basilic vein.  Based on the images, this patient will need extensive treatment for possible salvage of the fistula. I then gave the patient 4000 units of intravenous heparin. I then used a Kumpe catheter and a Glidewire and tediously worked through the branch into the true cephalic vein making a U configuration by starting near the elbow and going to the distal  forearm and then back up to the elbow through the cephalic vein.  The area at the perianastomotic cephalic vein would not be approachable from our access today.  Tediously, the Kumpe catheter and Glidewire were used to cross the occlusion and confirm intraluminal flow in the cephalic vein at the  elbow. I then crossed the stenosis with a V18 wire and remove the Kumpe catheter.  Based on the imaging, a 4 mm x 20 cm 0.018 angioplasty balloon was selected.  The balloon was centered around the forearm cephalic vein stenosis and inflated to 14 ATM for 1 minute(s).  The waist did not completely resolve and so I upsized to a 5 mm diameter by 15 cm Lutonix drug-coated angioplasty balloon to treat the forearm cephalic vein.  This was inflated to 14 atm for 1 minute.  There was still waist with this balloon.  I could not get a high pressure angioplasty balloon to traverse the tortuous branch and into the true cephalic vein to treat with it.  On completion imaging, a greater than 50 % residual stenosis was present within 2 areas of the forearm cephalic vein not including the perianastomotic stenosis, but there was now flow within the vein and a soft thrill could be palpated in the forearm.  At this point, I did not feel there was much more we could do from our access today.  If the cephalic vein remains patent for any period of time, I think it would be reasonable to consider trying to come from a cephalic vein access and performing balloon assisted maturation and likely stenting to the forearm cephalic vein in the next couple of weeks.  The patient has a catheter for his immediate dialysis needs.  A 4-0 Monocryl purse-string suture was sewn around the sheath.  The sheath was removed while tying down the suture.  A sterile bandage was applied to the puncture site.  COMPLICATIONS: None  CONDITION: Stable   Leotis Pain  06/08/2017 10:26 AM   This note was created with Dragon Medical transcription system. Any errors in dictation are purely unintentional.

## 2017-06-08 NOTE — Progress Notes (Signed)
[facility]  ROUNDING NOTE   Subjective:    Seen and examined on Third hemodialysis treatment. Tolerating treatment well. RIJ permcath.   AVF fistulogram today with 85-90% stenosis.     HEMODIALYSIS FLOWSHEET:  Blood Flow Rate (mL/min): 300 mL/min Arterial Pressure (mmHg): -90 mmHg Venous Pressure (mmHg): 110 mmHg Transmembrane Pressure (mmHg): 50 mmHg Ultrafiltration Rate (mL/min): 500 mL/min Dialysate Flow Rate (mL/min): 600 ml/min Conductivity: Machine : 15.4 Conductivity: Machine : 15.4 Dialysis Fluid Bolus: Normal Saline Bolus Amount (mL): 250 mL    Objective:  Vital signs in last 24 hours:  Temp:  [98.1 F (36.7 C)-98.7 F (37.1 C)] 98.6 F (37 C) (02/27 1556) Pulse Rate:  [69-89] 83 (02/27 1745) Resp:  [12-24] 14 (02/27 1745) BP: (135-181)/(54-137) 153/105 (02/27 1745) SpO2:  [96 %-100 %] 100 % (02/27 1745) Weight:  [172.2 kg (379 lb 10.1 oz)] 172.2 kg (379 lb 10.1 oz) (02/27 1556)  Weight change: 2.976 kg (6 lb 9 oz) Filed Weights   06/06/17 2005 06/07/17 0930 06/08/17 1556  Weight: (!) 169.7 kg (374 lb 1.9 oz) (!) 169.9 kg (374 lb 9 oz) (!) 172.2 kg (379 lb 10.1 oz)    Intake/Output: I/O last 3 completed shifts: In: 480 [P.O.:480] Out: 25 [Other:25]   Intake/Output this shift:  Total I/O In: 299.2 [P.O.:240; I.V.:59.2] Out: -   Physical Exam: General: NAD,   Head: [place]/AT  Eyes: Anicteric, PERRL  Neck: Supple, trachea midline  Lungs:  Clear to auscultation  Heart: Regular rate and rhythm  Abdomen:  Soft, nontender,   Extremities: trace peripheral edema.  Neurologic: Nonfocal, moving all four extremities  Skin: No lesions  Access: Left radiocephalic AVF, right IJ permcath 1/49    Basic Metabolic Panel: Recent Labs  Lab 06/05/17 1651 06/06/17 0733 06/06/17 2038 06/07/17 0842  NA 135 129*  --  137  K 4.5 5.3*  --  3.7  CL 106 105  --  105  CO2 15* 12*  --  19*  GLUCOSE 140* 131*  --  91  BUN 87* 91*  --  80*  CREATININE  14.40* 15.41*  --  12.73*  CALCIUM 8.5* 8.1* 8.1* 7.9*  PHOS  --   --   --  5.3*    Liver Function Tests: Recent Labs  Lab 06/05/17 1651 06/07/17 0842  AST 18 19  ALT 15* 14*  ALKPHOS 73 67  BILITOT 0.7 0.4  PROT 7.2 6.4*  ALBUMIN 3.7 3.4*   No results for input(s): LIPASE, AMYLASE in the last 168 hours. No results for input(s): AMMONIA in the last 168 hours.  CBC: Recent Labs  Lab 06/05/17 1651  WBC 7.6  HGB 9.5*  HCT 28.3*  MCV 86.0  PLT 175    Cardiac Enzymes: No results for input(s): CKTOTAL, CKMB, CKMBINDEX, TROPONINI in the last 168 hours.  BNP: Invalid input(s): POCBNP  CBG: Recent Labs  Lab 06/07/17 1712 06/07/17 2114 06/08/17 0818 06/08/17 1032 06/08/17 1148  GLUCAP 159* 136* 75 95 117*    Microbiology: Results for orders placed or performed during the hospital encounter of 06/05/17  MRSA PCR Screening     Status: Abnormal   Collection Time: 06/06/17 11:10 AM  Result Value Ref Range Status   MRSA by PCR POSITIVE (A) NEGATIVE Final    Comment:        The GeneXpert MRSA Assay (FDA approved for NASAL specimens only), is one component of a comprehensive MRSA colonization surveillance program. It is not intended to diagnose  MRSA infection nor to guide or monitor treatment for MRSA infections. RESULT CALLED TO, READ BACK BY AND VERIFIED WITH:  Donald Fernandez AT 3474 06/06/17 SDR Performed at [facility], [address] [place], [place] [address]     Coagulation Studies: No results for input(s): LABPROT, INR in the last 72 hours.  Urinalysis: No results for input(s): COLORURINE, LABSPEC, PHURINE, GLUCOSEU, HGBUR, BILIRUBINUR, KETONESUR, PROTEINUR, UROBILINOGEN, NITRITE, LEUKOCYTESUR in the last 72 hours.  Invalid input(s): APPERANCEUR    Imaging: No results found.   Medications:   . sodium chloride    . sodium chloride 10 mL/hr at 06/08/17 0905  .  ceFAZolin (ANCEF) IV     . amLODipine  2.5 mg Oral Daily  . aspirin  EC  81 mg Oral Daily  . Chlorhexidine Gluconate Cloth  6 each Topical Q0600  . cloNIDine  0.1 mg Oral BID  . doxazosin  8 mg Oral QHS  . furosemide  40 mg Oral Daily  . heparin  5,000 Units Subcutaneous Q8H  . insulin aspart  0-20 Units Subcutaneous TID WC  . insulin aspart  0-5 Units Subcutaneous QHS  . mupirocin ointment  1 application Nasal BID  . sodium chloride flush  3 mL Intravenous Q12H  . sodium polystyrene  30 g Oral Once  . tamsulosin  0.4 mg Oral QPC breakfast  . topiramate  25 mg Oral Daily  . traZODone  100 mg Oral QHS  . tuberculin  5 Units Intradermal Once   sodium chloride, acetaminophen **OR** acetaminophen, diphenhydrAMINE, fluticasone, hydrALAZINE, HYDROcodone-acetaminophen, ipratropium-albuterol, ondansetron **OR** ondansetron (ZOFRAN) IV, polyethylene glycol, sodium chloride flush  Assessment/ Plan:  [name] is a 47 y.o. black male  with diabetes mellitus type 2, obstructive sleep apnea, hypertension, hyperlipidemia, morbid obesity, testosterone deficiency, BPH, gout  with chronic kidney disease stage V.   1. End stage renal disease: progression from stage V chronic kidney disease ESRD secondary to diabetic nephropathy, hypertensive renal disease and NSAID induced nephropathy  Outpatient planning for  [facility] in [place]). Outpatient orders sent.  AVF seems to be not be salvagable at this point.  - appreciate vascular input.  - Initiate dialysis this admission. First treatment on 2/25.  - Tomorrow treatment in a chair.  - PPD pending  2. Hypertension: at goal  Home regimen of quinapril, hydralazine, amlodipine, doxazosin, clonidine, tamsulosin, furosemide, and metoprolol - hold quinapril due to angioedema.   3. Diabetes Mellitus type II noninsulin dependent with chronic kidney disease: on glipizide.   4. Secondary Hyperparathyroidism:  PTH elevated at 729 on 05/05/17. Phosphorus at goal.  PTH low at 46 - Discontinue  calcitriol  5. Anemia of chronic kidney disease: hemoglobin 9.5 - IV iron and epo with Hemodialysis TTS   LOS: 3 Donald Fernandez 2/27/20195:58 PM

## 2017-06-08 NOTE — Progress Notes (Signed)
Post HD assessment  

## 2017-06-08 NOTE — Progress Notes (Signed)
[facility] [place] NAME: Daelyn Mozer    MR#:  [id number]  DATE OF BIRTH:  Feb 08, 1971  SUBJECTIVE:  CHIEF COMPLAINT:   Chief Complaint  Patient presents with  . Facial Swelling    right sided   No shortness of breath Afebrile.  REVIEW OF SYSTEMS:    Review of Systems  Constitutional: Negative for chills and fever.  HENT: Negative for sore throat.   Eyes: Negative for blurred vision, double vision and pain.  Respiratory: Negative for cough, hemoptysis, shortness of breath and wheezing.   Cardiovascular: Negative for chest pain, palpitations, orthopnea and leg swelling.  Gastrointestinal: Negative for abdominal pain, constipation, diarrhea, heartburn, nausea and vomiting.  Genitourinary: Negative for dysuria and hematuria.  Musculoskeletal: Negative for back pain and joint pain.  Skin: Negative for rash.  Neurological: Negative for sensory change, speech change, focal weakness and headaches.  Endo/Heme/Allergies: Does not bruise/bleed easily.  Psychiatric/Behavioral: Negative for depression. The patient is not nervous/anxious.     DRUG ALLERGIES:   Allergies  Allergen Reactions  . Accupril [Quinapril Hcl] Anaphylaxis    Angioedema  . Ace Inhibitors Anaphylaxis    Angioedema    VITALS:  Blood pressure (!) 179/97, pulse 70, temperature 98.6 F (37 C), temperature source Oral, resp. rate (!) 21, height 5\' 11"  (1.803 m), weight (!) 169.9 kg (374 lb 9 oz), SpO2 99 %.  PHYSICAL EXAMINATION:   Physical Exam  GENERAL:  47 y.o.-year-old patient lying in the bed with no acute distress. Obese EYES: Pupils equal, round, reactive to light and accommodation. No scleral icterus. Extraocular muscles intact.  HEENT: Head atraumatic, normocephalic. Oropharynx and nasopharynx clear.  NECK:  Supple, no jugular venous distention. No thyroid enlargement, no tenderness.  LUNGS: Normal breath sounds bilaterally, no wheezing, rales, rhonchi. No  use of accessory muscles of respiration.  CARDIOVASCULAR: S1, S2 normal. No murmurs, rubs, or gallops.  ABDOMEN: Soft, nontender, nondistended. Bowel sounds present. No organomegaly or mass.  EXTREMITIES: No cyanosis, clubbing or edema b/l.    NEUROLOGIC: Cranial nerves II through XII are intact. No focal Motor or sensory deficits b/l.   PSYCHIATRIC: The patient is alert and oriented x 3.  SKIN: No obvious rash, lesion, or ulcer.   LABORATORY PANEL:   CBC Recent Labs  Lab 06/05/17 1651  WBC 7.6  HGB 9.5*  HCT 28.3*  PLT 175   ------------------------------------------------------------------------------------------------------------------ Chemistries  Recent Labs  Lab 06/07/17 0842  NA 137  K 3.7  CL 105  CO2 19*  GLUCOSE 91  BUN 80*  CREATININE 12.73*  CALCIUM 7.9*  AST 19  ALT 14*  ALKPHOS 67  BILITOT 0.4   ------------------------------------------------------------------------------------------------------------------  Cardiac Enzymes No results for input(s): TROPONINI in the last 168 hours. ------------------------------------------------------------------------------------------------------------------  RADIOLOGY:  No results found.   ASSESSMENT AND PLAN:   *Acute kidney injury over CKD stage V. Discussed with Dr. Juleen China  Temporary dialysis catheter placed.   Patient due for hemodialysis again today.  *AV fistula dysfunction.  Had fistulogram today with vascular surgery.  *Acute angioedema.  Likely due to quinapril.  Stopped. Steroids and Pepcid stopped  *Sleep apnea.  CPAP at night.  *Diabetes mellitus.  Sliding scale insulin.  *Essential hypertension.  Quinapril stopped.  Stopped hydralazine.  Decrease dose of clonidine and amlodipine.  *Anemia of chronic disease.  All the records are reviewed and case discussed with Care Management/Social Workerr. Management plans discussed with the patient, family and they are in agreement.  CODE  STATUS: FULL CODE  DVT Prophylaxis: SCDs  TOTAL TIME TAKING CARE OF THIS PATIENT: 35 minutes.   POSSIBLE D/C IN 1-2 DAYS, DEPENDING ON CLINICAL CONDITION.  Leia Alf Jaydalee Bardwell M.D on 06/08/2017 at 1:29 PM  Between 7am to 6pm - Pager - [phone]  After 6pm go to [email or web address] - password [email or web address] [facility]  Office  [phone]  CC: Primary care physician; Denton Lank, MD  Note: This dictation was prepared with Dragon dictation along with smaller phrase technology. Any transcriptional errors that result from this process are unintentional.

## 2017-06-09 LAB — HEPATITIS B SURFACE ANTIBODY,QUALITATIVE: HEP B S AB: NONREACTIVE

## 2017-06-09 LAB — CBC
HCT: 23.3 % — ABNORMAL LOW (ref 40.0–52.0)
Hemoglobin: 7.6 g/dL — ABNORMAL LOW (ref 13.0–18.0)
MCH: 28.5 pg (ref 26.0–34.0)
MCHC: 32.5 g/dL (ref 32.0–36.0)
MCV: 87.7 fL (ref 80.0–100.0)
PLATELETS: 130 10*3/uL — AB (ref 150–440)
RBC: 2.66 MIL/uL — AB (ref 4.40–5.90)
RDW: 16.9 % — AB (ref 11.5–14.5)
WBC: 8.2 10*3/uL (ref 3.8–10.6)

## 2017-06-09 LAB — GLUCOSE, CAPILLARY: GLUCOSE-CAPILLARY: 80 mg/dL (ref 65–99)

## 2017-06-09 LAB — HEPATITIS B CORE ANTIBODY, TOTAL: HEP B C TOTAL AB: NEGATIVE

## 2017-06-09 LAB — HEPATITIS C ANTIBODY

## 2017-06-09 LAB — HEPATITIS B SURFACE ANTIGEN: HEP B S AG: NEGATIVE

## 2017-06-09 MED ORDER — AMLODIPINE BESYLATE 10 MG PO TABS
10.0000 mg | ORAL_TABLET | Freq: Every day | ORAL | Status: DC
  Administered 2017-06-09: 15:00:00 10 mg via ORAL
  Filled 2017-06-09: qty 1

## 2017-06-09 MED ORDER — HYDRALAZINE HCL 50 MG PO TABS
25.0000 mg | ORAL_TABLET | Freq: Three times a day (TID) | ORAL | Status: DC
  Administered 2017-06-09: 25 mg via ORAL
  Filled 2017-06-09: qty 1

## 2017-06-09 MED ORDER — CLONIDINE HCL 0.1 MG PO TABS
0.1000 mg | ORAL_TABLET | Freq: Three times a day (TID) | ORAL | Status: DC
  Administered 2017-06-09: 0.1 mg via ORAL
  Filled 2017-06-09: qty 1

## 2017-06-09 NOTE — Progress Notes (Signed)
[facility]  Daily Progress Note   Subjective  - 1 Day Post-Op  Ready to go home.  Feels better after several HD treatments. Arm feels fine  Objective Vitals:   06/09/17 0945 06/09/17 1000 06/09/17 1015 06/09/17 1344  BP: 125/75 109/85 123/83 (!) 146/103  Pulse: 72 75 71 75  Resp: 18 16 14 18   Temp:    98 F (36.7 C)  TempSrc:    Oral  SpO2: 99% 100% 100% 100%  Weight:      Height:        Intake/Output Summary (Last 24 hours) at 06/09/2017 1416 Last data filed at 06/09/2017 0015 Gross per 24 hour  Intake 349.17 ml  Output 1020 ml  Net -670.83 ml    PULM  CTAB CV  RRR VASC  Poor thrill in left arm AVF  Laboratory CBC    Component Value Date/Time   WBC 8.2 06/09/2017 0424   HGB 7.6 (L) 06/09/2017 0424   HGB 14.2 12/31/2011 1850   HCT 23.3 (L) 06/09/2017 0424   HCT 42.4 12/31/2011 1850   PLT 130 (L) 06/09/2017 0424   PLT 270 12/31/2011 1850    BMET    Component Value Date/Time   NA 137 06/07/2017 0842   NA 145 12/31/2011 1850   K 3.7 06/07/2017 0842   K 3.5 12/31/2011 1850   CL 105 06/07/2017 0842   CL 108 (H) 12/31/2011 1850   CO2 19 (L) 06/07/2017 0842   CO2 28 12/31/2011 1850   GLUCOSE 91 06/07/2017 0842   GLUCOSE 63 (L) 12/31/2011 1850   BUN 80 (H) 06/07/2017 0842   BUN 14 12/31/2011 1850   CREATININE 12.73 (H) 06/07/2017 0842   CREATININE 1.72 (H) 12/31/2011 1850   CALCIUM 7.9 (L) 06/07/2017 0842   CALCIUM 8.1 (L) 06/06/2017 2038   GFRNONAA 4 (L) 06/07/2017 0842   GFRNONAA 48 (L) 12/31/2011 1850   GFRAA 5 (L) 06/07/2017 0842   GFRAA 55 (L) 12/31/2011 1850    Assessment/Planning: POD #1 s/p fistulogram with angioplasty for an occluded fistula   This may or may not be a salvageable fistula, and that was discussed with the patient  Would plan a repeat fistulogram in about 2 weeks to aggressively balloon the fistula and likely place stents at that point.  Patient voices his understanding.    Leotis Pain  06/09/2017, 2:16 PM

## 2017-06-09 NOTE — Progress Notes (Signed)
Post HD Assessment 

## 2017-06-09 NOTE — Progress Notes (Signed)
Epogen and Venofer listed as hemodialysis medications; not given by RN at this time; unable to clarify if they were administered in hemodialysis

## 2017-06-09 NOTE — Care Management (Signed)
Donald Fernandez, [employer] representative indicated that Donald Fernandez has a chair at [facility]. Tuesday, Thursday and Saturday. Information about [facility] was given. Possible discharge 06/10/17 Shelbie Ammons RN MSN CCM Care Management [phone]

## 2017-06-09 NOTE — Progress Notes (Signed)
MD order received to discharge pt home today; verbally reviewed AVS with pt; no questions voiced at this time; pt discharged via wheelchair by nursing to the ED entrance in order for the pt to get to his personal car that is parked in the ED parking lot

## 2017-06-09 NOTE — Progress Notes (Signed)
Pre HD Tx  

## 2017-06-09 NOTE — Progress Notes (Signed)
[facility]  ROUNDING NOTE   Subjective:   Seen and examined on hemodialysis. Fourth treatment. Seated in chair. UF of 3 liters    HEMODIALYSIS FLOWSHEET:  Blood Flow Rate (mL/min): 400 mL/min Arterial Pressure (mmHg): -160 mmHg Venous Pressure (mmHg): 200 mmHg Transmembrane Pressure (mmHg): 60 mmHg Ultrafiltration Rate (mL/min): 500 mL/min Dialysate Flow Rate (mL/min): 600 ml/min Conductivity: Machine : 13.9 Conductivity: Machine : 13.9 Dialysis Fluid Bolus: Normal Saline Bolus Amount (mL): 250 mL    Objective:  Vital signs in last 24 hours:  Temp:  [98 F (36.7 C)-98.6 F (37 C)] 98 F (36.7 C) (02/28 1344) Pulse Rate:  [66-101] 75 (02/28 1344) Resp:  [13-18] 18 (02/28 1344) BP: (109-186)/(72-137) 146/103 (02/28 1344) SpO2:  [99 %-100 %] 100 % (02/28 1344) Weight:  [154.8 kg (341 lb 4.4 oz)-172.2 kg (379 lb 10.1 oz)] 154.8 kg (341 lb 4.4 oz) (02/27 1919)  Weight change: 2.3 kg (5 lb 1.1 oz) Filed Weights   06/07/17 0930 06/08/17 1556 06/08/17 1919  Weight: (!) 169.9 kg (374 lb 9 oz) (!) 172.2 kg (379 lb 10.1 oz) (!) 154.8 kg (341 lb 4.4 oz)    Intake/Output: I/O last 3 completed shifts: In: 349.2 [P.O.:240; I.V.:59.2; IV Piggyback:50] Out: 1020 [Other:1020]   Intake/Output this shift:  No intake/output data recorded.  Physical Exam: General: NAD,   Head: [place]/AT  Eyes: Anicteric, PERRL  Neck: Supple, trachea midline  Lungs:  Clear to auscultation  Heart: Regular rate and rhythm  Abdomen:  Soft, nontender,   Extremities: trace peripheral edema.  Neurologic: Nonfocal, moving all four extremities  Skin: No lesions  Access: Left radiocephalic AVF, right IJ permcath 2/75    Basic Metabolic Panel: Recent Labs  Lab 06/05/17 1651 06/06/17 0733 06/06/17 2038 06/07/17 0842  NA 135 129*  --  137  K 4.5 5.3*  --  3.7  CL 106 105  --  105  CO2 15* 12*  --  19*  GLUCOSE 140* 131*  --  91  BUN 87* 91*  --  80*  CREATININE 14.40* 15.41*  --   12.73*  CALCIUM 8.5* 8.1* 8.1* 7.9*  PHOS  --   --   --  5.3*    Liver Function Tests: Recent Labs  Lab 06/05/17 1651 06/07/17 0842  AST 18 19  ALT 15* 14*  ALKPHOS 73 67  BILITOT 0.7 0.4  PROT 7.2 6.4*  ALBUMIN 3.7 3.4*   No results for input(s): LIPASE, AMYLASE in the last 168 hours. No results for input(s): AMMONIA in the last 168 hours.  CBC: Recent Labs  Lab 06/05/17 1651 06/09/17 0424  WBC 7.6 8.2  HGB 9.5* 7.6*  HCT 28.3* 23.3*  MCV 86.0 87.7  PLT 175 130*    Cardiac Enzymes: No results for input(s): CKTOTAL, CKMB, CKMBINDEX, TROPONINI in the last 168 hours.  BNP: Invalid input(s): POCBNP  CBG: Recent Labs  Lab 06/08/17 0818 06/08/17 1032 06/08/17 1148 06/08/17 2100 06/09/17 0751  GLUCAP 75 95 117* 134* 80    Microbiology: Results for orders placed or performed during the hospital encounter of 06/05/17  MRSA PCR Screening     Status: Abnormal   Collection Time: 06/06/17 11:10 AM  Result Value Ref Range Status   MRSA by PCR POSITIVE (A) NEGATIVE Final    Comment:        The GeneXpert MRSA Assay (FDA approved for NASAL specimens only), is one component of a comprehensive MRSA colonization surveillance program. It is not intended  to diagnose MRSA infection nor to guide or monitor treatment for MRSA infections. RESULT CALLED TO, READ BACK BY AND VERIFIED WITH:  Lucious Groves AT 0258 06/06/17 SDR Performed at [facility], [address] [place], [place] [address]     Coagulation Studies: No results for input(s): LABPROT, INR in the last 72 hours.  Urinalysis: No results for input(s): COLORURINE, LABSPEC, PHURINE, GLUCOSEU, HGBUR, BILIRUBINUR, KETONESUR, PROTEINUR, UROBILINOGEN, NITRITE, LEUKOCYTESUR in the last 72 hours.  Invalid input(s): APPERANCEUR    Imaging: No results found.   Medications:   . sodium chloride    . sodium chloride Stopped (06/08/17 2047)  .  ceFAZolin (ANCEF) IV Stopped (06/09/17 0100)  . iron  sucrose     . amLODipine  10 mg Oral Daily  . aspirin EC  81 mg Oral Daily  . Chlorhexidine Gluconate Cloth  6 each Topical Q0600  . cloNIDine  0.1 mg Oral TID  . doxazosin  8 mg Oral QHS  . epoetin (EPOGEN/PROCRIT) injection  10,000 Units Intravenous Q T,Th,Sa-HD  . furosemide  40 mg Oral Daily  . heparin  5,000 Units Subcutaneous Q8H  . hydrALAZINE  25 mg Oral Q8H  . insulin aspart  0-20 Units Subcutaneous TID WC  . insulin aspart  0-5 Units Subcutaneous QHS  . metoprolol succinate  100 mg Oral Daily  . mupirocin ointment  1 application Nasal BID  . sodium chloride flush  3 mL Intravenous Q12H  . sodium polystyrene  30 g Oral Once  . tamsulosin  0.4 mg Oral QPC breakfast  . topiramate  25 mg Oral Daily  . traZODone  100 mg Oral QHS   sodium chloride, acetaminophen **OR** acetaminophen, diphenhydrAMINE, fluticasone, hydrALAZINE, HYDROcodone-acetaminophen, ipratropium-albuterol, ondansetron **OR** ondansetron (ZOFRAN) IV, polyethylene glycol, sodium chloride flush  Assessment/ Plan:  Donald Fernandez is a 47 y.o. black male  with diabetes mellitus type 2, obstructive sleep apnea, hypertension, hyperlipidemia, morbid obesity, testosterone deficiency, BPH, gout  with chronic kidney disease stage V.   1. End stage renal disease:   ESRD secondary to diabetic nephropathy, hypertensive renal disease and NSAID induced nephropathy  Outpatient planning for  [facility] in [place]).   AVF seems to be not be salvagable at this point.  - appreciate vascular input.  - Initiated dialysis this admission. First treatment on 2/25.  - TTS schedule  2. Hypertension: at goal  Home regimen of quinapril, hydralazine, amlodipine, doxazosin, clonidine, tamsulosin, furosemide, and metoprolol - hold quinapril due to angioedema.   3. Diabetes Mellitus type II noninsulin dependent with chronic kidney disease: on glipizide.   4. Secondary Hyperparathyroidism:  PTH elevated at 729 on  05/05/17. Phosphorus at goal.  PTH low at 46 - Discontinued calcitriol  5. Anemia of chronic kidney disease:   - IV iron and epo with Hemodialysis TTS   LOS: 4 Donald Fernandez 2/28/20192:29 PM

## 2017-06-10 NOTE — Discharge Summary (Signed)
[facility] [place] NAME: Abdi Husak    MR#:  [id number]  DATE OF BIRTH:  April 03, 1971  DATE OF ADMISSION:  06/05/2017 ADMITTING PHYSICIAN: Gorden Harms, MD  DATE OF DISCHARGE: 06/09/2017  3:49 PM  PRIMARY CARE PHYSICIAN: Denton Lank, MD   ADMISSION DIAGNOSIS:  Angioedema, initial encounter [T78.3XXA]  DISCHARGE DIAGNOSIS:  Active Problems:   Angioedema   SECONDARY DIAGNOSIS:   Past Medical History:  Diagnosis Date  . Asthma    triggered by enviornmental allergies  . Diabetes mellitus without complication ([place])   . Hypertension   . Neuropathy   . Renal disorder    CKD4 - not on dialysis  . Restless legs   . Sleep apnea    uses cpap     ADMITTING HISTORY  HISTORY OF PRESENT ILLNESS: Mete Purdum  is a 47 y.o. male with a known history per below, presenting with recurrent facial/throat/neck swelling started this morning, patient has had several episodes in the past due to unknown etiology, in the emergency room patient was found to have angioedema, hospitalist consulted for further evaluation/care, patient evaluated in the emergency room, did receive racemic epinephrine/IV Solu-Medrol/Benadryl, patient in no apparent distress, now being admitted for acute recurrent angioedema most likely secondary to ACE inhibitor use.    HOSPITAL COURSE:   *CKD stage V with uremic symptoms.  Now end-stage renal disease. Discussed with Dr. Juleen China  Temporary dialysis catheter placed.   Patient had 4 sessions of hemodialysis in the hospital.  Set up for Tuesday Thursday and Saturday sessions as outpatient.  Discharged home in stable condition.  No uremic symptoms.  No fluid overload.  Hyperkalemia is resolved.  *AV fistula dysfunction.  Had fistulogram with vascular surgery.  Still unable to use for hemodialysis. Follow-up with nephrology and vascular surgery as outpatient.  *Acute angioedema.  Likely due to quinapril.   Stopped. Steroids and Pepcid stopped  *Sleep apnea.  CPAP at night.  *Diabetes mellitus.  Sliding scale insulin.  *Essential hypertension.  Quinapril stopped.  Patient is on amlodipine, clonidine and hydralazine.  *Anemia of chronic disease.  Patient stable for discharge home to continue outpatient hemodialysis and follow with nephrology and vascular surgery.  CONSULTS OBTAINED:  Treatment Team:  Lavonia Dana, MD Algernon Huxley, MD  DRUG ALLERGIES:   Allergies  Allergen Reactions  . Accupril [Quinapril Hcl] Anaphylaxis    Angioedema  . Ace Inhibitors Anaphylaxis    Angioedema    DISCHARGE MEDICATIONS:   Allergies as of 06/09/2017      Reactions   Accupril [quinapril Hcl] Anaphylaxis   Angioedema   Ace Inhibitors Anaphylaxis   Angioedema      Medication List    STOP taking these medications   quinapril 10 MG tablet Commonly known as:  ACCUPRIL     TAKE these medications   allopurinol 100 MG tablet Commonly known as:  ZYLOPRIM Take 100 mg by mouth daily.   amLODipine 10 MG tablet Commonly known as:  NORVASC Take 10 mg by mouth daily.   aspirin EC 81 MG tablet Take 81 mg by mouth daily.   atorvastatin 20 MG tablet Commonly known as:  LIPITOR Take 20 mg by mouth daily.   calcitRIOL 0.25 MCG capsule Commonly known as:  ROCALTROL Take 0.25 mcg by mouth 3 (three) times a week.   Cholecalciferol 400 units Caps Take by mouth.   cloNIDine 0.1 MG tablet Commonly known as:  CATAPRES Take 1 tablet (0.1 mg  total) by mouth 3 (three) times daily.   doxazosin 8 MG tablet Commonly known as:  CARDURA Take 8 mg by mouth at bedtime.   fluticasone 50 MCG/ACT nasal spray Commonly known as:  FLONASE Place 1 spray into both nostrils daily as needed for allergies or rhinitis.   furosemide 40 MG tablet Commonly known as:  LASIX Take 40 mg by mouth daily.   gabapentin 100 MG capsule Commonly known as:  NEURONTIN Take 1 capsule (100 mg total) by mouth 3  (three) times daily.   glipiZIDE 2.5 MG 24 hr tablet Commonly known as:  GLUCOTROL XL Take 1 tablet (2.5 mg total) by mouth daily with breakfast.   hydrALAZINE 100 MG tablet Commonly known as:  APRESOLINE Take 100 mg by mouth 3 (three) times daily.   tamsulosin 0.4 MG Caps capsule Commonly known as:  FLOMAX Take 0.4 mg by mouth daily after breakfast.   topiramate 50 MG tablet Commonly known as:  TOPAMAX Take 50 mg by mouth daily as needed.   traZODone 100 MG tablet Commonly known as:  DESYREL Take 100 mg by mouth at bedtime.   vitamin E 400 UNIT capsule Take 400 Units by mouth daily.       Today   VITAL SIGNS:  Blood pressure (!) 146/103, pulse 75, temperature 98 F (36.7 C), temperature source Oral, resp. rate 18, height 5\' 11"  (1.803 m), weight (!) 154.8 kg (341 lb 4.4 oz), SpO2 100 %.  I/O:  No intake or output data in the 24 hours ending 06/10/17 1520  PHYSICAL EXAMINATION:  Physical Exam  GENERAL:  47 y.o.-year-old patient lying in the bed with no acute distress.  Obese LUNGS: Normal breath sounds bilaterally, no wheezing, rales,rhonchi or crepitation. No use of accessory muscles of respiration.  CARDIOVASCULAR: S1, S2 normal. No murmurs, rubs, or gallops.  ABDOMEN: Soft, non-tender, non-distended. Bowel sounds present. No organomegaly or mass.  NEUROLOGIC: Moves all 4 extremities. PSYCHIATRIC: The patient is alert and oriented x 3.  SKIN: No obvious rash, lesion, or ulcer.  Tunneled right IJ dialysis catheter  DATA REVIEW:   CBC Recent Labs  Lab 06/09/17 0424  WBC 8.2  HGB 7.6*  HCT 23.3*  PLT 130*    Chemistries  Recent Labs  Lab 06/07/17 0842  NA 137  K 3.7  CL 105  CO2 19*  GLUCOSE 91  BUN 80*  CREATININE 12.73*  CALCIUM 7.9*  AST 19  ALT 14*  ALKPHOS 67  BILITOT 0.4    Cardiac Enzymes No results for input(s): TROPONINI in the last 168 hours.  Microbiology Results  Results for orders placed or performed during the hospital  encounter of 06/05/17  MRSA PCR Screening     Status: Abnormal   Collection Time: 06/06/17 11:10 AM  Result Value Ref Range Status   MRSA by PCR POSITIVE (A) NEGATIVE Final    Comment:        The GeneXpert MRSA Assay (FDA approved for NASAL specimens only), is one component of a comprehensive MRSA colonization surveillance program. It is not intended to diagnose MRSA infection nor to guide or monitor treatment for MRSA infections. RESULT CALLED TO, READ BACK BY AND VERIFIED WITH:  Lucious Groves AT 4492 06/06/17 SDR Performed at [facility], [address] [place], [place] [address]     RADIOLOGY:  No results found.  Follow up with PCP in 1 week.  Management plans discussed with the patient, family and they are in agreement.  CODE STATUS:  Code Status History    Date Active Date Inactive Code Status Order ID Comments User Context   06/05/2017 17:57 06/09/2017 19:46 Full Code [id number]  Gorden Harms, MD Inpatient   12/17/2016 16:14 12/17/2016 21:17 Full Code [id number]  Consuella Lose, MD Inpatient   07/09/2016 13:10 07/11/2016 15:43 Full Code [id number]  Theodoro Grist, MD ED      TOTAL TIME TAKING CARE OF THIS PATIENT ON DAY OF DISCHARGE: more than 30 minutes.   Leia Alf Dayne Dekay M.D on 06/10/2017 at 3:20 PM  Between 7am to 6pm - Pager - [phone]  After 6pm go to [email or web address] - password [email or web address] [facility]  Office  [phone]  CC: Primary care physician; Denton Lank, MD  Note: This dictation was prepared with Dragon dictation along with smaller phrase technology. Any transcriptional errors that result from this process are unintentional.

## 2017-06-13 DIAGNOSIS — G4733 Obstructive sleep apnea (adult) (pediatric): Secondary | ICD-10-CM | POA: Diagnosis not present

## 2017-06-14 DIAGNOSIS — Z23 Encounter for immunization: Secondary | ICD-10-CM | POA: Diagnosis not present

## 2017-06-14 DIAGNOSIS — N186 End stage renal disease: Secondary | ICD-10-CM | POA: Diagnosis not present

## 2017-06-14 DIAGNOSIS — Z992 Dependence on renal dialysis: Secondary | ICD-10-CM | POA: Diagnosis not present

## 2017-06-14 DIAGNOSIS — N2581 Secondary hyperparathyroidism of renal origin: Secondary | ICD-10-CM | POA: Diagnosis not present

## 2017-06-14 DIAGNOSIS — D509 Iron deficiency anemia, unspecified: Secondary | ICD-10-CM | POA: Diagnosis not present

## 2017-06-14 DIAGNOSIS — D631 Anemia in chronic kidney disease: Secondary | ICD-10-CM | POA: Diagnosis not present

## 2017-06-16 DIAGNOSIS — E119 Type 2 diabetes mellitus without complications: Secondary | ICD-10-CM | POA: Diagnosis not present

## 2017-06-16 DIAGNOSIS — Z1159 Encounter for screening for other viral diseases: Secondary | ICD-10-CM | POA: Diagnosis not present

## 2017-06-21 ENCOUNTER — Emergency Department: Payer: PPO

## 2017-06-21 ENCOUNTER — Other Ambulatory Visit: Payer: Self-pay

## 2017-06-21 ENCOUNTER — Emergency Department
Admission: EM | Admit: 2017-06-21 | Discharge: 2017-06-21 | Disposition: A | Discharge status: Home / Self Care | Payer: PPO | Attending: Emergency Medicine | Admitting: Emergency Medicine

## 2017-06-21 ENCOUNTER — Encounter: Payer: Self-pay | Admitting: Emergency Medicine

## 2017-06-21 DIAGNOSIS — S0990XA Unspecified injury of head, initial encounter: Secondary | ICD-10-CM | POA: Diagnosis not present

## 2017-06-21 DIAGNOSIS — S0240CA Maxillary fracture, right side, initial encounter for closed fracture: Secondary | ICD-10-CM | POA: Diagnosis not present

## 2017-06-21 DIAGNOSIS — Y998 Other external cause status: Secondary | ICD-10-CM | POA: Diagnosis not present

## 2017-06-21 DIAGNOSIS — J069 Acute upper respiratory infection, unspecified: Secondary | ICD-10-CM | POA: Insufficient documentation

## 2017-06-21 DIAGNOSIS — F1210 Cannabis abuse, uncomplicated: Secondary | ICD-10-CM | POA: Insufficient documentation

## 2017-06-21 DIAGNOSIS — S022XXA Fracture of nasal bones, initial encounter for closed fracture: Secondary | ICD-10-CM

## 2017-06-21 DIAGNOSIS — Z7984 Long term (current) use of oral hypoglycemic drugs: Secondary | ICD-10-CM | POA: Diagnosis not present

## 2017-06-21 DIAGNOSIS — Z79899 Other long term (current) drug therapy: Secondary | ICD-10-CM | POA: Diagnosis not present

## 2017-06-21 DIAGNOSIS — Y92410 Unspecified street and highway as the place of occurrence of the external cause: Secondary | ICD-10-CM | POA: Insufficient documentation

## 2017-06-21 DIAGNOSIS — Z7982 Long term (current) use of aspirin: Secondary | ICD-10-CM | POA: Diagnosis not present

## 2017-06-21 DIAGNOSIS — Z992 Dependence on renal dialysis: Secondary | ICD-10-CM | POA: Insufficient documentation

## 2017-06-21 DIAGNOSIS — S0993XA Unspecified injury of face, initial encounter: Secondary | ICD-10-CM | POA: Diagnosis not present

## 2017-06-21 DIAGNOSIS — Y9389 Activity, other specified: Secondary | ICD-10-CM | POA: Insufficient documentation

## 2017-06-21 DIAGNOSIS — J45909 Unspecified asthma, uncomplicated: Secondary | ICD-10-CM | POA: Insufficient documentation

## 2017-06-21 DIAGNOSIS — F17210 Nicotine dependence, cigarettes, uncomplicated: Secondary | ICD-10-CM | POA: Insufficient documentation

## 2017-06-21 DIAGNOSIS — E1140 Type 2 diabetes mellitus with diabetic neuropathy, unspecified: Secondary | ICD-10-CM | POA: Insufficient documentation

## 2017-06-21 DIAGNOSIS — I120 Hypertensive chronic kidney disease with stage 5 chronic kidney disease or end stage renal disease: Secondary | ICD-10-CM | POA: Insufficient documentation

## 2017-06-21 DIAGNOSIS — S02401A Maxillary fracture, unspecified, initial encounter for closed fracture: Secondary | ICD-10-CM | POA: Diagnosis not present

## 2017-06-21 DIAGNOSIS — N186 End stage renal disease: Secondary | ICD-10-CM | POA: Diagnosis not present

## 2017-06-21 DIAGNOSIS — E1122 Type 2 diabetes mellitus with diabetic chronic kidney disease: Secondary | ICD-10-CM | POA: Diagnosis not present

## 2017-06-21 LAB — CBC WITH DIFFERENTIAL/PLATELET
Basophils Absolute: 0 10*3/uL (ref 0–0.1)
Basophils Relative: 1 %
EOS ABS: 0.2 10*3/uL (ref 0–0.7)
Eosinophils Relative: 2 %
HEMATOCRIT: 23.5 % — AB (ref 40.0–52.0)
HEMOGLOBIN: 7.7 g/dL — AB (ref 13.0–18.0)
LYMPHS ABS: 1.3 10*3/uL (ref 1.0–3.6)
LYMPHS PCT: 15 %
MCH: 28.6 pg (ref 26.0–34.0)
MCHC: 32.8 g/dL (ref 32.0–36.0)
MCV: 87.4 fL (ref 80.0–100.0)
Monocytes Absolute: 0.6 10*3/uL (ref 0.2–1.0)
Monocytes Relative: 7 %
NEUTROS ABS: 6.6 10*3/uL — AB (ref 1.4–6.5)
NEUTROS PCT: 75 %
Platelets: 178 10*3/uL (ref 150–440)
RBC: 2.69 MIL/uL — AB (ref 4.40–5.90)
RDW: 16.9 % — ABNORMAL HIGH (ref 11.5–14.5)
WBC: 8.8 10*3/uL (ref 3.8–10.6)

## 2017-06-21 LAB — COMPREHENSIVE METABOLIC PANEL
ALK PHOS: 80 U/L (ref 38–126)
ALT: 18 U/L (ref 17–63)
AST: 35 U/L (ref 15–41)
Albumin: 3.5 g/dL (ref 3.5–5.0)
Anion gap: 14 (ref 5–15)
BUN: 36 mg/dL — ABNORMAL HIGH (ref 6–20)
CALCIUM: 8.7 mg/dL — AB (ref 8.9–10.3)
CO2: 21 mmol/L — AB (ref 22–32)
CREATININE: 12.69 mg/dL — AB (ref 0.61–1.24)
Chloride: 104 mmol/L (ref 101–111)
GFR calc non Af Amer: 4 mL/min — ABNORMAL LOW (ref >60)
GFR, EST AFRICAN AMERICAN: 5 mL/min — AB (ref >60)
Glucose, Bld: 102 mg/dL — ABNORMAL HIGH (ref 65–99)
Potassium: 3.4 mmol/L — ABNORMAL LOW (ref 3.5–5.1)
SODIUM: 139 mmol/L (ref 135–145)
Total Bilirubin: 0.7 mg/dL (ref 0.3–1.2)
Total Protein: 6.8 g/dL (ref 6.5–8.1)

## 2017-06-21 LAB — GROUP A STREP BY PCR: Group A Strep by PCR: NOT DETECTED

## 2017-06-21 MED ORDER — AMOXICILLIN 500 MG PO TABS: 500.0000 mg | ORAL_TABLET | Freq: Two times a day (BID) | ORAL | 0 refills | Status: DC

## 2017-06-21 NOTE — ED Provider Notes (Signed)
-----------------------------------------   11:10 PM on 06/21/2017 -----------------------------------------  Patient's labs are resulted largely at his baseline.  Potassium 3.4.  Patient has follow-up with dialysis tomorrow.  I believe the patient is safe for discharge home at this time we will discharge with a short course of antibiotics per Dr. Jerene Canny discharge instructions.   Harvest Dark, MD 06/21/17 2310
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
## 2017-06-21 NOTE — ED Notes (Signed)
1 unsuccessful attempt to draw blood by Raquel, RN (RIGHT hand); Raquel, RN indicated that after so many failed attempts in Triage that lab may need to be notified to perform blood draw. Dr Joni Fears to be made aware of difficulty obtaining specimens for lab analysis.

## 2017-06-21 NOTE — Discharge Instructions (Signed)
Your labs today were unremarkable. Follow-up with dialysis tomorrow. Your CT scan shows that you have a fracture of the upper jaw bone and right-side nasal bone. You should be evaluated by [facility] dentistry for further management to stabilize this fracture and prevent damage to your teeth.  Ct Head Wo Contrast  Result Date: 06/21/2017 CLINICAL DATA:  Initial evaluation for acute trauma, right-sided facial swelling. EXAM: CT HEAD WITHOUT CONTRAST CT MAXILLOFACIAL WITHOUT CONTRAST TECHNIQUE: Multidetector CT imaging of the head and maxillofacial structures were performed using the standard protocol without intravenous contrast. Multiplanar CT image reconstructions of the maxillofacial structures were also generated. COMPARISON:  Prior MRI from 08/20/2016. FINDINGS: CT HEAD FINDINGS Brain: Generalized age-related cerebral atrophy with mild chronic small vessel ischemic disease. Scatter remote lacunar infarct present within the bilateral basal ganglia and left thalamus. No acute intracranial hemorrhage. No acute large vessel territory infarct. No mass lesion, midline shift or mass effect. No hydrocephalus. No extra-axial fluid collection. Vascular: No hyperdense vessel. Calcified atherosclerosis present at the skull base. Skull: Soft tissue swelling present at the right periorbital/facial region. No other scalp soft tissue abnormality. Calvarium intact. Other: Mastoid air cells are clear. CT MAXILLOFACIAL FINDINGS Osseous: Zygomatic arches intact. Pterygoid plates intact. Acute minimally depressed right nasal bone fracture (series 7, image 36). Left nasal bone intact. Nasal septum mildly bowed to the right but intact. No acute mandibular fracture. Mandibular condyles normally situated. There is an acute fracture extending through the right maxillary central incisor (series 7, image 59). Associated mild displacement with dental loosening. No other acute maxillary fracture. No other acute abnormality about the dentition.  Scattered dental caries with periapical lucencies noted. Orbits: Globes intact. No retro-orbital hematoma or other pathology. Bony orbits intact. Sinuses: Mild scattered mucoperiosteal thickening within the ethmoidal air cells and maxillary sinuses. Otherwise clear. No hemosinus. Soft tissues: Soft tissue contusion present within the right periorbital region and right face. No other acute soft tissue injury identified. Right IJ approach central venous catheter noted. Subcentimeter sialolith noted within the superior right parotid gland. IMPRESSION: CT HEAD: 1. No acute intracranial process identified. 2. Generalized age-related cerebral atrophy with mild chronic small vessel ischemic disease. CT MAXILLOFACIAL: 1. Acute mildly displaced fracture involving the right maxillary alveolar ridge with mild displacement of the right maxillary central incisor. 2. Acute minimally depressed right nasal bone fracture. 3. Right periorbital and facial soft tissue contusion. 4. Poor dentition. Electronically Signed   By: Jeannine Boga M.D.   On: 06/21/2017 21:14   Ct Maxillofacial Wo Contrast  Result Date: 06/21/2017 CLINICAL DATA:  Initial evaluation for acute trauma, right-sided facial swelling. EXAM: CT HEAD WITHOUT CONTRAST CT MAXILLOFACIAL WITHOUT CONTRAST TECHNIQUE: Multidetector CT imaging of the head and maxillofacial structures were performed using the standard protocol without intravenous contrast. Multiplanar CT image reconstructions of the maxillofacial structures were also generated. COMPARISON:  Prior MRI from 08/20/2016. FINDINGS: CT HEAD FINDINGS Brain: Generalized age-related cerebral atrophy with mild chronic small vessel ischemic disease. Scatter remote lacunar infarct present within the bilateral basal ganglia and left thalamus. No acute intracranial hemorrhage. No acute large vessel territory infarct. No mass lesion, midline shift or mass effect. No hydrocephalus. No extra-axial fluid collection.  Vascular: No hyperdense vessel. Calcified atherosclerosis present at the skull base. Skull: Soft tissue swelling present at the right periorbital/facial region. No other scalp soft tissue abnormality. Calvarium intact. Other: Mastoid air cells are clear. CT MAXILLOFACIAL FINDINGS Osseous: Zygomatic arches intact. Pterygoid plates intact. Acute minimally depressed right nasal bone fracture (series 7,  image 36). Left nasal bone intact. Nasal septum mildly bowed to the right but intact. No acute mandibular fracture. Mandibular condyles normally situated. There is an acute fracture extending through the right maxillary central incisor (series 7, image 59). Associated mild displacement with dental loosening. No other acute maxillary fracture. No other acute abnormality about the dentition. Scattered dental caries with periapical lucencies noted. Orbits: Globes intact. No retro-orbital hematoma or other pathology. Bony orbits intact. Sinuses: Mild scattered mucoperiosteal thickening within the ethmoidal air cells and maxillary sinuses. Otherwise clear. No hemosinus. Soft tissues: Soft tissue contusion present within the right periorbital region and right face. No other acute soft tissue injury identified. Right IJ approach central venous catheter noted. Subcentimeter sialolith noted within the superior right parotid gland. IMPRESSION: CT HEAD: 1. No acute intracranial process identified. 2. Generalized age-related cerebral atrophy with mild chronic small vessel ischemic disease. CT MAXILLOFACIAL: 1. Acute mildly displaced fracture involving the right maxillary alveolar ridge with mild displacement of the right maxillary central incisor. 2. Acute minimally depressed right nasal bone fracture. 3. Right periorbital and facial soft tissue contusion. 4. Poor dentition. Electronically Signed   By: Jeannine Boga M.D.   On: 06/21/2017 21:14

## 2017-06-21 NOTE — ED Notes (Signed)
Spoke with Dr Quentin Cornwall regarding pt's symptoms; orders obtained

## 2017-06-21 NOTE — ED Notes (Addendum)
Attempted blood draw to right hand by paramedic student and right wrist by this nurse without success; pt tolerated well without c/o

## 2017-06-21 NOTE — ED Notes (Signed)
Phlebotomy tech at bedside to collect blood work at this time.

## 2017-07-10 DIAGNOSIS — N186 End stage renal disease: Secondary | ICD-10-CM | POA: Diagnosis not present

## 2017-07-10 DIAGNOSIS — Z992 Dependence on renal dialysis: Secondary | ICD-10-CM | POA: Diagnosis not present

## 2017-07-11 ENCOUNTER — Ambulatory Visit (INDEPENDENT_AMBULATORY_CARE_PROVIDER_SITE_OTHER): Payer: PPO | Admitting: Vascular Surgery

## 2017-07-11 ENCOUNTER — Encounter (INDEPENDENT_AMBULATORY_CARE_PROVIDER_SITE_OTHER): Payer: Self-pay | Admitting: Vascular Surgery

## 2017-07-11 VITALS — BP 142/92 | HR 94 | Resp 16 | Ht 71.0 in | Wt 352.0 lb

## 2017-07-11 DIAGNOSIS — E1169 Type 2 diabetes mellitus with other specified complication: Secondary | ICD-10-CM | POA: Diagnosis not present

## 2017-07-11 DIAGNOSIS — N189 Chronic kidney disease, unspecified: Secondary | ICD-10-CM | POA: Diagnosis not present

## 2017-07-11 DIAGNOSIS — E669 Obesity, unspecified: Secondary | ICD-10-CM

## 2017-07-11 DIAGNOSIS — I10 Essential (primary) hypertension: Secondary | ICD-10-CM | POA: Diagnosis not present

## 2017-07-12 DIAGNOSIS — N2581 Secondary hyperparathyroidism of renal origin: Secondary | ICD-10-CM | POA: Diagnosis not present

## 2017-07-12 DIAGNOSIS — Z23 Encounter for immunization: Secondary | ICD-10-CM | POA: Diagnosis not present

## 2017-07-12 DIAGNOSIS — N186 End stage renal disease: Secondary | ICD-10-CM | POA: Diagnosis not present

## 2017-07-12 DIAGNOSIS — D631 Anemia in chronic kidney disease: Secondary | ICD-10-CM | POA: Diagnosis not present

## 2017-07-12 DIAGNOSIS — Z992 Dependence on renal dialysis: Secondary | ICD-10-CM | POA: Diagnosis not present

## 2017-07-12 DIAGNOSIS — D509 Iron deficiency anemia, unspecified: Secondary | ICD-10-CM | POA: Diagnosis not present

## 2017-07-13 ENCOUNTER — Encounter (INDEPENDENT_AMBULATORY_CARE_PROVIDER_SITE_OTHER): Payer: Self-pay | Admitting: Vascular Surgery

## 2017-07-13 NOTE — Progress Notes (Signed)
MRN : [id number]  Donald Fernandez is a 47 y.o. (17-Sep-1970) male who presents with chief complaint of  Chief Complaint  Patient presents with  . Follow-up    right foot pain  .  History of Present Illness:   The patient is seen for evaluation for dialysis access. The patient has chronic renal insufficiency stage V secondary to hypertension. The patient's most recent creatinine clearance is less than 25. The patient volume status has not yet become an issue. Patient's blood pressures been relatively well controlled. There are mild uremic symptoms which appear to be relatively well tolerated at this time. The patient is right-handed.  The patient has been considering the various methods of dialysis and wishes to proceed with hemodialysis and therefore creation of AV access.  The patient denies amaurosis fugax or recent TIA symptoms. There are no recent neurological changes noted. The patient denies claudication symptoms or rest pain symptoms. The patient denies history of DVT, PE or superficial thrombophlebitis. The patient denies recent episodes of angina or shortness of breath.   Current Meds  Medication Sig  . allopurinol (ZYLOPRIM) 100 MG tablet Take 100 mg by mouth daily.  Marland Kitchen amLODipine (NORVASC) 10 MG tablet Take 10 mg by mouth daily.  Marland Kitchen amoxicillin (AMOXIL) 500 MG tablet Take 1 tablet (500 mg total) by mouth 2 (two) times daily.  Marland Kitchen aspirin EC 81 MG tablet Take 81 mg by mouth daily.  Marland Kitchen atorvastatin (LIPITOR) 20 MG tablet Take 20 mg by mouth daily.  . calcitRIOL (ROCALTROL) 0.25 MCG capsule Take 0.25 mcg by mouth 3 (three) times a week.   . Cholecalciferol 400 units CAPS Take by mouth.  . cloNIDine (CATAPRES) 0.1 MG tablet Take 1 tablet (0.1 mg total) by mouth 3 (three) times daily.  Marland Kitchen doxazosin (CARDURA) 8 MG tablet Take 8 mg by mouth at bedtime.  . fluticasone (FLONASE) 50 MCG/ACT nasal spray Place 1 spray into both nostrils daily as needed for allergies or rhinitis.  .  furosemide (LASIX) 40 MG tablet Take 40 mg by mouth daily.   Marland Kitchen gabapentin (NEURONTIN) 100 MG capsule Take 1 capsule (100 mg total) by mouth 3 (three) times daily.  Marland Kitchen glipiZIDE (GLUCOTROL XL) 2.5 MG 24 hr tablet Take 1 tablet (2.5 mg total) by mouth daily with breakfast.  . hydrALAZINE (APRESOLINE) 100 MG tablet Take 100 mg by mouth 3 (three) times daily.   . tamsulosin (FLOMAX) 0.4 MG CAPS capsule Take 0.4 mg by mouth daily after breakfast.   . topiramate (TOPAMAX) 50 MG tablet Take 50 mg by mouth daily as needed.  . traZODone (DESYREL) 100 MG tablet Take 100 mg by mouth at bedtime.  . vitamin E 400 UNIT capsule Take 400 Units by mouth daily.    Past Medical History:  Diagnosis Date  . Asthma    triggered by enviornmental allergies  . Diabetes mellitus without complication ([place])   . Hypertension   . Neuropathy   . Renal disorder    CKD4 - not on dialysis  . Restless legs   . Sleep apnea    uses cpap    Past Surgical History:  Procedure Laterality Date  . A/V FISTULAGRAM N/A 06/08/2017   Procedure: A/V FISTULAGRAM;  Surgeon: Algernon Huxley, MD;  Location: [place] [facility];  Service: Cardiovascular;  Laterality: N/A;  . ANKLE SURGERY    . AV FISTULA PLACEMENT Left 08/04/2016   Procedure: ARTERIOVENOUS (AV) FISTULA CREATION ( RADIOCEPHALIC );  Surgeon: Katha Cabal,  MD;  Location: [facility];  Service: Vascular;  Laterality: Left;  . DIALYSIS/PERMA CATHETER INSERTION N/A 06/06/2017   Procedure: DIALYSIS/PERMA CATHETER INSERTION;  Surgeon: Algernon Huxley, MD;  Location: [place] [facility];  Service: Cardiovascular;  Laterality: N/A;  . IR ANGIO INTRA EXTRACRAN SEL INTERNAL CAROTID UNI R MOD SED  12/17/2016  . RADIOLOGY WITH ANESTHESIA N/A 12/17/2016   Procedure: Arteriogram/Venogram with right venous sinus manometry;  Surgeon: Consuella Lose, MD;  Location: [place];  Service: Radiology;  Laterality: N/A;    Social History Social History   Tobacco Use  . Smoking status: Current  Every Day Smoker    Types: Cigarettes  . Smokeless tobacco: Never Used  . Tobacco comment: pt refusept says 1 pack of cigarette lasts him 1-2 weeks  Substance Use Topics  . Alcohol use: Yes    Comment: weekends  . Drug use: Yes    Types: Marijuana    Family History Family History  Problem Relation Age of Onset  . AAA (abdominal aortic aneurysm) Mother     Allergies  Allergen Reactions  . Accupril [Quinapril Hcl] Anaphylaxis    Angioedema  . Ace Inhibitors Anaphylaxis    Angioedema     REVIEW OF SYSTEMS (Negative unless checked)  Constitutional: [] Weight loss  [] Fever  [] Chills Cardiac: [] Chest pain   [] Chest pressure   [] Palpitations   [] Shortness of breath when laying flat   [] Shortness of breath with exertion. Vascular:  [] Pain in legs with walking   [] Pain in legs at rest  [] History of DVT   [] Phlebitis   [] Swelling in legs   [] Varicose veins   [] Non-healing ulcers Pulmonary:   [] Uses home oxygen   [] Productive cough   [] Hemoptysis   [] Wheeze  [] COPD   [] Asthma Neurologic:  [] Dizziness   [] Seizures   [] History of stroke   [] History of TIA  [] Aphasia   [] Vissual changes   [] Weakness or numbness in arm   [] Weakness or numbness in leg Musculoskeletal:   [] Joint swelling   [] Joint pain   [] Low back pain Hematologic:  [] Easy bruising  [] Easy bleeding   [] Hypercoagulable state   [] Anemic Gastrointestinal:  [] Diarrhea   [] Vomiting  [] Gastroesophageal reflux/heartburn   [] Difficulty swallowing. Genitourinary:  [x] Chronic kidney disease   [] Difficult urination  [] Frequent urination   [] Blood in urine Skin:  [] Rashes   [] Ulcers  Psychological:  [] History of anxiety   []  History of major depression.  Physical Examination  Vitals:   07/11/17 1451  BP: (!) 142/92  Pulse: 94  Resp: 16  Weight: (!) 352 lb (159.7 kg)  Height: 5\' 11"  (1.803 m)   Body mass index is 49.09 kg/m. Gen: WD/WN, NAD Head: [place]/AT, No temporalis wasting.  Ear/Nose/Throat: Hearing grossly intact, nares  w/o erythema or drainage Eyes: PER, EOMI, sclera nonicteric.  Neck: Supple, no large masses.   Pulmonary:  Good air movement, no audible wheezing bilaterally, no use of accessory muscles.  Cardiac: RRR, no JVD Vascular:  Vessel Right Left  Radial Palpable Palpable  Ulnar Palpable Palpable  Brachial Palpable Palpable  Gastrointestinal: Non-distended. No guarding/no peritoneal signs.  Musculoskeletal: M/S 5/5 throughout.  No deformity or atrophy.  Neurologic: CN 2-12 intact. Symmetrical.  Speech is fluent. Motor exam as listed above. Psychiatric: Judgment intact, Mood & affect appropriate for pt's clinical situation. Dermatologic: No rashes or ulcers noted.  No changes consistent with cellulitis. Lymph : No lichenification or skin changes of chronic lymphedema.  CBC Lab Results  Component Value Date   WBC  8.8 06/21/2017   HGB 7.7 (L) 06/21/2017   HCT 23.5 (L) 06/21/2017   MCV 87.4 06/21/2017   PLT 178 06/21/2017    BMET    Component Value Date/Time   NA 139 06/21/2017 2221   NA 145 12/31/2011 1850   K 3.4 (L) 06/21/2017 2221   K 3.5 12/31/2011 1850   CL 104 06/21/2017 2221   CL 108 (H) 12/31/2011 1850   CO2 21 (L) 06/21/2017 2221   CO2 28 12/31/2011 1850   GLUCOSE 102 (H) 06/21/2017 2221   GLUCOSE 63 (L) 12/31/2011 1850   BUN 36 (H) 06/21/2017 2221   BUN 14 12/31/2011 1850   CREATININE 12.69 (H) 06/21/2017 2221   CREATININE 1.72 (H) 12/31/2011 1850   CALCIUM 8.7 (L) 06/21/2017 2221   CALCIUM 8.1 (L) 06/06/2017 2038   GFRNONAA 4 (L) 06/21/2017 2221   GFRNONAA 48 (L) 12/31/2011 1850   GFRAA 5 (L) 06/21/2017 2221   GFRAA 55 (L) 12/31/2011 1850   CrCl cannot be calculated (Patient's most recent lab result is older than the maximum 21 days allowed.).  COAG Lab Results  Component Value Date   INR 0.97 12/17/2016   INR 0.99 07/29/2016   INR 1.06 07/09/2016    Radiology Ct Head Wo Contrast  Result Date: 06/21/2017 CLINICAL DATA:  Initial evaluation for acute  trauma, right-sided facial swelling. EXAM: CT HEAD WITHOUT CONTRAST CT MAXILLOFACIAL WITHOUT CONTRAST TECHNIQUE: Multidetector CT imaging of the head and maxillofacial structures were performed using the standard protocol without intravenous contrast. Multiplanar CT image reconstructions of the maxillofacial structures were also generated. COMPARISON:  Prior MRI from 08/20/2016. FINDINGS: CT HEAD FINDINGS Brain: Generalized age-related cerebral atrophy with mild chronic small vessel ischemic disease. Scatter remote lacunar infarct present within the bilateral basal ganglia and left thalamus. No acute intracranial hemorrhage. No acute large vessel territory infarct. No mass lesion, midline shift or mass effect. No hydrocephalus. No extra-axial fluid collection. Vascular: No hyperdense vessel. Calcified atherosclerosis present at the skull base. Skull: Soft tissue swelling present at the right periorbital/facial region. No other scalp soft tissue abnormality. Calvarium intact. Other: Mastoid air cells are clear. CT MAXILLOFACIAL FINDINGS Osseous: Zygomatic arches intact. Pterygoid plates intact. Acute minimally depressed right nasal bone fracture (series 7, image 36). Left nasal bone intact. Nasal septum mildly bowed to the right but intact. No acute mandibular fracture. Mandibular condyles normally situated. There is an acute fracture extending through the right maxillary central incisor (series 7, image 59). Associated mild displacement with dental loosening. No other acute maxillary fracture. No other acute abnormality about the dentition. Scattered dental caries with periapical lucencies noted. Orbits: Globes intact. No retro-orbital hematoma or other pathology. Bony orbits intact. Sinuses: Mild scattered mucoperiosteal thickening within the ethmoidal air cells and maxillary sinuses. Otherwise clear. No hemosinus. Soft tissues: Soft tissue contusion present within the right periorbital region and right face. No  other acute soft tissue injury identified. Right IJ approach central venous catheter noted. Subcentimeter sialolith noted within the superior right parotid gland. IMPRESSION: CT HEAD: 1. No acute intracranial process identified. 2. Generalized age-related cerebral atrophy with mild chronic small vessel ischemic disease. CT MAXILLOFACIAL: 1. Acute mildly displaced fracture involving the right maxillary alveolar ridge with mild displacement of the right maxillary central incisor. 2. Acute minimally depressed right nasal bone fracture. 3. Right periorbital and facial soft tissue contusion. 4. Poor dentition. Electronically Signed   By: Jeannine Boga M.D.   On: 06/21/2017 21:14   Ct Maxillofacial Wo Contrast  Result Date: 06/21/2017 CLINICAL DATA:  Initial evaluation for acute trauma, right-sided facial swelling. EXAM: CT HEAD WITHOUT CONTRAST CT MAXILLOFACIAL WITHOUT CONTRAST TECHNIQUE: Multidetector CT imaging of the head and maxillofacial structures were performed using the standard protocol without intravenous contrast. Multiplanar CT image reconstructions of the maxillofacial structures were also generated. COMPARISON:  Prior MRI from 08/20/2016. FINDINGS: CT HEAD FINDINGS Brain: Generalized age-related cerebral atrophy with mild chronic small vessel ischemic disease. Scatter remote lacunar infarct present within the bilateral basal ganglia and left thalamus. No acute intracranial hemorrhage. No acute large vessel territory infarct. No mass lesion, midline shift or mass effect. No hydrocephalus. No extra-axial fluid collection. Vascular: No hyperdense vessel. Calcified atherosclerosis present at the skull base. Skull: Soft tissue swelling present at the right periorbital/facial region. No other scalp soft tissue abnormality. Calvarium intact. Other: Mastoid air cells are clear. CT MAXILLOFACIAL FINDINGS Osseous: Zygomatic arches intact. Pterygoid plates intact. Acute minimally depressed right nasal bone  fracture (series 7, image 36). Left nasal bone intact. Nasal septum mildly bowed to the right but intact. No acute mandibular fracture. Mandibular condyles normally situated. There is an acute fracture extending through the right maxillary central incisor (series 7, image 59). Associated mild displacement with dental loosening. No other acute maxillary fracture. No other acute abnormality about the dentition. Scattered dental caries with periapical lucencies noted. Orbits: Globes intact. No retro-orbital hematoma or other pathology. Bony orbits intact. Sinuses: Mild scattered mucoperiosteal thickening within the ethmoidal air cells and maxillary sinuses. Otherwise clear. No hemosinus. Soft tissues: Soft tissue contusion present within the right periorbital region and right face. No other acute soft tissue injury identified. Right IJ approach central venous catheter noted. Subcentimeter sialolith noted within the superior right parotid gland. IMPRESSION: CT HEAD: 1. No acute intracranial process identified. 2. Generalized age-related cerebral atrophy with mild chronic small vessel ischemic disease. CT MAXILLOFACIAL: 1. Acute mildly displaced fracture involving the right maxillary alveolar ridge with mild displacement of the right maxillary central incisor. 2. Acute minimally depressed right nasal bone fracture. 3. Right periorbital and facial soft tissue contusion. 4. Poor dentition. Electronically Signed   By: Jeannine Boga M.D.   On: 06/21/2017 21:14    Assessment/Plan 1. Chronic kidney disease, unspecified CKD stage Recommend:  The patient currently has catheter based access.  Patient should have vein mapping of his arms to plan for upper extremity access creation.  The goal is to improve the quality of dialysis therapy.  The risks, benefits and alternative therapies with respect to to the different kinds of dialysis accesee were reviewed in detail with the patient.  All questions were answered.   The patient agrees to proceed with mapping.     A total of 30 minutes was spent with this patient and greater than 50% was spent in counseling and coordination of care with the patient.  Discussion included the treatment options for vascular access crreation including indications for surgery and intervention.  Also discussed is the appropriate timing of treatment.  In addition medical therapy was discussed.  - VAS [place] UPPER EXT VEIN MAPPING (PRE-OP AVF); Future  2. Essential hypertension Continue antihypertensive medications as already ordered, these medications have been reviewed and there are no changes at this time.   3. Diabetes mellitus type 2 in obese [facility]) Continue hypoglycemic medications as already ordered, these medications have been reviewed and there are no changes at this time.  Hgb A1C to be monitored as already arranged by primary service     Hortencia Pilar,  MD  07/13/2017 9:51 PM

## 2017-07-20 DIAGNOSIS — Z79899 Other long term (current) drug therapy: Secondary | ICD-10-CM | POA: Diagnosis not present

## 2017-07-20 DIAGNOSIS — N401 Enlarged prostate with lower urinary tract symptoms: Secondary | ICD-10-CM | POA: Diagnosis not present

## 2017-07-20 DIAGNOSIS — N5201 Erectile dysfunction due to arterial insufficiency: Secondary | ICD-10-CM | POA: Diagnosis not present

## 2017-07-20 DIAGNOSIS — E291 Testicular hypofunction: Secondary | ICD-10-CM | POA: Diagnosis not present

## 2017-07-26 ENCOUNTER — Other Ambulatory Visit (INDEPENDENT_AMBULATORY_CARE_PROVIDER_SITE_OTHER): Payer: Self-pay | Admitting: Vascular Surgery

## 2017-07-26 DIAGNOSIS — N189 Chronic kidney disease, unspecified: Secondary | ICD-10-CM

## 2017-07-27 ENCOUNTER — Ambulatory Visit (INDEPENDENT_AMBULATORY_CARE_PROVIDER_SITE_OTHER): Payer: PPO

## 2017-07-27 ENCOUNTER — Encounter (INDEPENDENT_AMBULATORY_CARE_PROVIDER_SITE_OTHER): Payer: Self-pay

## 2017-07-27 ENCOUNTER — Ambulatory Visit (INDEPENDENT_AMBULATORY_CARE_PROVIDER_SITE_OTHER): Payer: PPO | Admitting: Vascular Surgery

## 2017-07-27 ENCOUNTER — Encounter (INDEPENDENT_AMBULATORY_CARE_PROVIDER_SITE_OTHER): Payer: Self-pay | Admitting: Vascular Surgery

## 2017-07-27 VITALS — BP 124/83 | HR 85 | Resp 17 | Ht 71.0 in | Wt 343.0 lb

## 2017-07-27 DIAGNOSIS — N189 Chronic kidney disease, unspecified: Secondary | ICD-10-CM

## 2017-07-27 DIAGNOSIS — I10 Essential (primary) hypertension: Secondary | ICD-10-CM | POA: Diagnosis not present

## 2017-07-27 DIAGNOSIS — N186 End stage renal disease: Secondary | ICD-10-CM | POA: Diagnosis not present

## 2017-07-27 DIAGNOSIS — Z992 Dependence on renal dialysis: Secondary | ICD-10-CM | POA: Diagnosis not present

## 2017-07-27 DIAGNOSIS — E669 Obesity, unspecified: Secondary | ICD-10-CM

## 2017-07-27 DIAGNOSIS — E1169 Type 2 diabetes mellitus with other specified complication: Secondary | ICD-10-CM | POA: Diagnosis not present

## 2017-07-27 NOTE — Progress Notes (Signed)
Subjective:    Patient ID: Donald Fernandez, male    DOB: 01-02-1971, 47 y.o.   MRN: [id number] Chief Complaint  Patient presents with  . Follow-up    left arm vein mapp   Patient presents to review vascular studies.  The patient was last seen on July 11, 2017 for evaluation of a new dialysis creation.  The patient is currently being maintained by a right IJ PermCath without issue.  The patient is status post a left radiocephalic AV fistulogram on June 08, 2017 for a poorly maturing fistula.  His radiocephalic AV fistula has not been accessed since his recent intervention.  The patient underwent bilateral upper extremity vein mapping which was amenable for a left endovascular AVF creation or a left brachiocephalic AV fistula creation.  After an extensive discussion about both procedures the patient has decided to move forward with the left endovascular aVF creation.  The patient denies any left upper extremity discomfort.  The patient denies any uremic symptoms.  The patient denies any issues with his right IJ PermCath.  The patient denies any fever, nausea vomiting.  Review of Systems  Constitutional: Negative.   HENT: Negative.   Eyes: Negative.   Respiratory: Negative.   Cardiovascular: Negative.   Gastrointestinal: Negative.   Endocrine: Negative.   Genitourinary:       ESRD  Musculoskeletal: Negative.   Skin: Negative.   Allergic/Immunologic: Negative.   Neurological: Negative.   Hematological: Negative.   Psychiatric/Behavioral: Negative.       Objective:   Physical Exam  Constitutional: He is oriented to person, place, and time. He appears well-developed and well-nourished. No distress.  HENT:  Head: Normocephalic and atraumatic.  Right Ear: External ear normal.  Left Ear: External ear normal.  Eyes: Pupils are equal, round, and reactive to light. Conjunctivae and EOM are normal.  Neck: Normal range of motion.  Cardiovascular: Normal rate, regular rhythm, normal heart  sounds and intact distal pulses.  Pulses:      Radial pulses are 2+ on the right side, and 2+ on the left side.  Left radiocephalic AV fistula: Bruit and thrill.  Skin is intact.  Pulmonary/Chest: Effort normal and breath sounds normal. No stridor. No respiratory distress. He has no wheezes.  Abdominal: Soft. Bowel sounds are normal. He exhibits no distension. There is no tenderness. There is no guarding.  Musculoskeletal: Normal range of motion. He exhibits no edema.  Neurological: He is alert and oriented to person, place, and time.  Skin: Skin is warm and dry. He is not diaphoretic.  Psychiatric: He has a normal mood and affect. His behavior is normal. Judgment and thought content normal.  Vitals reviewed.  BP 124/83 (BP Location: Right Arm)   Pulse 85   Resp 17   Ht 5\' 11"  (1.803 m)   Wt (!) 343 lb (155.6 kg)   BMI 47.84 kg/m   Past Medical History:  Diagnosis Date  . Asthma    triggered by enviornmental allergies  . Diabetes mellitus without complication ([place])   . Hypertension   . Neuropathy   . Renal disorder    CKD4 - not on dialysis  . Restless legs   . Sleep apnea    uses cpap   Social History   Socioeconomic History  . Marital status: Single    Spouse name: Not on file  . Number of children: Not on file  . Years of education: Not on file  . Highest education level: Not on file  Occupational History  . Not on file  Social Needs  . Financial resource strain: Not on file  . Food insecurity:    Worry: Not on file    Inability: Not on file  . Transportation needs:    Medical: Not on file    Non-medical: Not on file  Tobacco Use  . Smoking status: Current Every Day Smoker    Types: Cigarettes  . Smokeless tobacco: Never Used  . Tobacco comment: pt refusept says 1 pack of cigarette lasts him 1-2 weeks  Substance and Sexual Activity  . Alcohol use: Yes    Comment: weekends  . Drug use: Yes    Types: Marijuana  . Sexual activity: Yes    Birth  control/protection: Condom  Lifestyle  . Physical activity:    Days per week: Not on file    Minutes per session: Not on file  . Stress: Not on file  Relationships  . Social connections:    Talks on phone: Not on file    Gets together: Not on file    Attends religious service: Not on file    Active member of club or organization: Not on file    Attends meetings of clubs or organizations: Not on file    Relationship status: Not on file  . Intimate partner violence:    Fear of current or ex partner: Not on file    Emotionally abused: Not on file    Physically abused: Not on file    Forced sexual activity: Not on file  Other Topics Concern  . Not on file  Social History Narrative  . Not on file   Past Surgical History:  Procedure Laterality Date  . A/V FISTULAGRAM N/A 06/08/2017   Procedure: A/V FISTULAGRAM;  Surgeon: Algernon Huxley, MD;  Location: [facility];  Service: Cardiovascular;  Laterality: N/A;  . ANKLE SURGERY    . AV FISTULA PLACEMENT Left 08/04/2016   Procedure: ARTERIOVENOUS (AV) FISTULA CREATION ( RADIOCEPHALIC );  Surgeon: Katha Cabal, MD;  Location: [facility];  Service: Vascular;  Laterality: Left;  . DIALYSIS/PERMA CATHETER INSERTION N/A 06/06/2017   Procedure: DIALYSIS/PERMA CATHETER INSERTION;  Surgeon: Algernon Huxley, MD;  Location: [facility];  Service: Cardiovascular;  Laterality: N/A;  . IR ANGIO INTRA EXTRACRAN SEL INTERNAL CAROTID UNI R MOD SED  12/17/2016  . RADIOLOGY WITH ANESTHESIA N/A 12/17/2016   Procedure: Arteriogram/Venogram with right venous sinus manometry;  Surgeon: Consuella Lose, MD;  Location: [facility];  Service: Radiology;  Laterality: N/A;   Family History  Problem Relation Age of Onset  . AAA (abdominal aortic aneurysm) Mother    Allergies  Allergen Reactions  . Accupril [Quinapril Hcl] Anaphylaxis    Angioedema  . Ace Inhibitors Anaphylaxis    Angioedema      Assessment & Plan:  Patient presents to review  vascular studies.  The patient was last seen on July 11, 2017 for evaluation of a new dialysis creation.  The patient is currently being maintained by a right IJ PermCath without issue.  The patient is status post a left radiocephalic AV fistulogram on June 08, 2017 for a poorly maturing fistula.  His radiocephalic AV fistula has not been accessed since his recent intervention.  The patient underwent bilateral upper extremity vein mapping which was amenable for a left endovascular AVF creation or a left brachiocephalic AV fistula creation.  After an extensive discussion about both procedures the patient has decided to move forward with  the left endovascular aVF creation.  The patient denies any left upper extremity discomfort.  The patient denies any uremic symptoms.  The patient denies any issues with his right IJ PermCath.  The patient denies any fever, nausea vomiting.  1. ESRD on dialysis ([place]) - Stable The patient was given a letter for his dialysis center to at least try to access his left radiocephalic AV fistula.   If this is not successful we will move forward with creation of a left endovascular AV graft as per the patient wishes. Procedure, risks and benefits explained to the patient All questions answered The patient would like to proceed. The patient is to continue to dialyze through his right IJ PermCath The patient understands his fistula will take approximately 4-6 weeks to mature During that time he will continue to dialyze through his PermCath. The patient expresses understanding.  2. Diabetes mellitus type 2 in obese ([place]) - Stable Encouraged good control as its slows the progression of atherosclerotic disease  3. Essential hypertension - Stable Encouraged good control as its slows the progression of atherosclerotic disease  Current Outpatient Medications on File Prior to Visit  Medication Sig Dispense Refill  . allopurinol (ZYLOPRIM) 100 MG tablet Take 100 mg by mouth  daily.    Marland Kitchen amLODipine (NORVASC) 10 MG tablet Take 10 mg by mouth daily.    Marland Kitchen amoxicillin (AMOXIL) 500 MG tablet Take 1 tablet (500 mg total) by mouth 2 (two) times daily. 14 tablet 0  . aspirin EC 81 MG tablet Take 81 mg by mouth daily.    Marland Kitchen atorvastatin (LIPITOR) 20 MG tablet Take 20 mg by mouth daily.    . calcitRIOL (ROCALTROL) 0.25 MCG capsule Take 0.25 mcg by mouth 3 (three) times a week.     . Cholecalciferol 400 units CAPS Take by mouth.    . cloNIDine (CATAPRES) 0.1 MG tablet Take 1 tablet (0.1 mg total) by mouth 3 (three) times daily. 90 tablet 0  . doxazosin (CARDURA) 8 MG tablet Take 8 mg by mouth at bedtime.    . fluticasone (FLONASE) 50 MCG/ACT nasal spray Place 1 spray into both nostrils daily as needed for allergies or rhinitis.    . furosemide (LASIX) 40 MG tablet Take 40 mg by mouth daily.     Marland Kitchen gabapentin (NEURONTIN) 100 MG capsule Take 1 capsule (100 mg total) by mouth 3 (three) times daily. 90 capsule 2  . glipiZIDE (GLUCOTROL XL) 2.5 MG 24 hr tablet Take 1 tablet (2.5 mg total) by mouth daily with breakfast. 30 tablet 2  . hydrALAZINE (APRESOLINE) 100 MG tablet Take 100 mg by mouth 3 (three) times daily.     . tamsulosin (FLOMAX) 0.4 MG CAPS capsule Take 0.4 mg by mouth daily after breakfast.     . topiramate (TOPAMAX) 50 MG tablet Take 50 mg by mouth daily as needed.    . traZODone (DESYREL) 100 MG tablet Take 100 mg by mouth at bedtime.    . vitamin E 400 UNIT capsule Take 400 Units by mouth daily.     No current facility-administered medications on file prior to visit.    There are no Patient Instructions on file for this visit. No follow-ups on file.  Brekyn Huntoon A Josiel Gahm, PA-C

## 2017-08-01 ENCOUNTER — Ambulatory Visit (INDEPENDENT_AMBULATORY_CARE_PROVIDER_SITE_OTHER): Payer: PPO | Admitting: Vascular Surgery

## 2017-08-01 ENCOUNTER — Other Ambulatory Visit (INDEPENDENT_AMBULATORY_CARE_PROVIDER_SITE_OTHER): Payer: Self-pay | Admitting: Vascular Surgery

## 2017-08-01 ENCOUNTER — Encounter (INDEPENDENT_AMBULATORY_CARE_PROVIDER_SITE_OTHER): Payer: PPO

## 2017-08-02 DIAGNOSIS — Z992 Dependence on renal dialysis: Secondary | ICD-10-CM | POA: Diagnosis not present

## 2017-08-02 DIAGNOSIS — E119 Type 2 diabetes mellitus without complications: Secondary | ICD-10-CM | POA: Diagnosis not present

## 2017-08-04 ENCOUNTER — Ambulatory Visit (INDEPENDENT_AMBULATORY_CARE_PROVIDER_SITE_OTHER): Payer: PPO | Admitting: Vascular Surgery

## 2017-08-04 ENCOUNTER — Encounter (INDEPENDENT_AMBULATORY_CARE_PROVIDER_SITE_OTHER): Payer: PPO

## 2017-08-08 ENCOUNTER — Other Ambulatory Visit: Payer: Self-pay

## 2017-08-08 ENCOUNTER — Encounter
Admission: RE | Admit: 2017-08-08 | Discharge: 2017-08-08 | Disposition: A | Discharge status: Home / Self Care | Payer: PPO | Source: Ambulatory Visit | Attending: Vascular Surgery | Admitting: Vascular Surgery

## 2017-08-08 DIAGNOSIS — Z01810 Encounter for preprocedural cardiovascular examination: Secondary | ICD-10-CM | POA: Insufficient documentation

## 2017-08-08 DIAGNOSIS — Z01812 Encounter for preprocedural laboratory examination: Secondary | ICD-10-CM | POA: Diagnosis not present

## 2017-08-08 DIAGNOSIS — R9431 Abnormal electrocardiogram [ECG] [EKG]: Secondary | ICD-10-CM | POA: Diagnosis not present

## 2017-08-08 HISTORY — DX: Hyperlipidemia, unspecified: E78.5

## 2017-08-08 HISTORY — DX: Benign prostatic hyperplasia without lower urinary tract symptoms: N40.0

## 2017-08-08 HISTORY — DX: Gastro-esophageal reflux disease without esophagitis: K21.9

## 2017-08-08 HISTORY — DX: Gout, unspecified: M10.9

## 2017-08-08 HISTORY — DX: Unspecified osteoarthritis, unspecified site: M19.90

## 2017-08-08 HISTORY — DX: Dependence on renal dialysis: Z99.2

## 2017-08-08 LAB — CBC WITH DIFFERENTIAL/PLATELET
BASOS ABS: 0 10*3/uL (ref 0–0.1)
BASOS PCT: 1 %
Eosinophils Absolute: 0.2 10*3/uL (ref 0–0.7)
Eosinophils Relative: 3 %
HEMATOCRIT: 32.1 % — AB (ref 40.0–52.0)
Hemoglobin: 11 g/dL — ABNORMAL LOW (ref 13.0–18.0)
Lymphocytes Relative: 15 %
Lymphs Abs: 1.1 10*3/uL (ref 1.0–3.6)
MCH: 31.7 pg (ref 26.0–34.0)
MCHC: 34.2 g/dL (ref 32.0–36.0)
MCV: 92.9 fL (ref 80.0–100.0)
Monocytes Absolute: 0.6 10*3/uL (ref 0.2–1.0)
Monocytes Relative: 8 %
NEUTROS ABS: 5.3 10*3/uL (ref 1.4–6.5)
NEUTROS PCT: 73 %
Platelets: 238 10*3/uL (ref 150–440)
RBC: 3.45 MIL/uL — AB (ref 4.40–5.90)
RDW: 15.8 % — ABNORMAL HIGH (ref 11.5–14.5)
WBC: 7.2 10*3/uL (ref 3.8–10.6)

## 2017-08-08 LAB — PROTIME-INR
INR: 0.91
Prothrombin Time: 12.2 seconds (ref 11.4–15.2)

## 2017-08-08 LAB — BASIC METABOLIC PANEL
ANION GAP: 11 (ref 5–15)
BUN: 37 mg/dL — ABNORMAL HIGH (ref 6–20)
CALCIUM: 8.8 mg/dL — AB (ref 8.9–10.3)
CO2: 24 mmol/L (ref 22–32)
Chloride: 102 mmol/L (ref 101–111)
Creatinine, Ser: 11.92 mg/dL — ABNORMAL HIGH (ref 0.61–1.24)
GFR calc non Af Amer: 4 mL/min — ABNORMAL LOW (ref >60)
GFR, EST AFRICAN AMERICAN: 5 mL/min — AB (ref >60)
Glucose, Bld: 148 mg/dL — ABNORMAL HIGH (ref 65–99)
Potassium: 3.8 mmol/L (ref 3.5–5.1)
Sodium: 137 mmol/L (ref 135–145)

## 2017-08-08 LAB — TYPE AND SCREEN
ABO/RH(D): A POS
Antibody Screen: NEGATIVE

## 2017-08-08 LAB — APTT: aPTT: 27 seconds (ref 24–36)

## 2017-08-08 NOTE — Patient Instructions (Addendum)
Your procedure is scheduled on: Wednesday, Aug 17, 2017 Report to specials recovery on the 1st floor of the [facility] at 6:30 am   REMEMBER: Instructions that are not followed completely may result in serious medical risk, up to and including death; or upon the discretion of your surgeon and anesthesiologist your surgery may need to be rescheduled.  Do not eat food after midnight the night before your procedure.  No gum chewing, lozengers or hard candies.  You may however, drink water up to 2 hours before you are scheduled to arrive for your surgery. Do not drink anything within 2 hours of the start of your surgery.  No Alcohol for 24 hours before or after surgery.  No Smoking including e-cigarettes for 24 hours prior to surgery.  No chewable tobacco products for at least 6 hours prior to surgery.  No nicotine patches on the day of surgery.  On the morning of surgery brush your teeth with toothpaste and water, you may rinse your mouth with mouthwash if you wish. Do not swallow any toothpaste or mouthwash.  Notify your doctor if there is any change in your medical condition (cold, fever, infection).  Do not wear jewelry, make-up, hairpins, clips or nail polish.  Do not wear lotions, powders, or perfumes. You may wear deodorant.  Do not shave 48 hours prior to surgery. Men may shave face and neck.  Contacts and dentures may not be worn into surgery.  Do not bring valuables to the hospital, including drivers license, insurance or credit cards.  [facility] is not responsible for any belongings or valuables.   TAKE THESE MEDICATIONS THE MORNING OF SURGERY:  1.  AMLODIPINE 2.  CLONIDINE 3.  GABAPENTIN 4.  HYDRALAZINE  Use CHG wipes as directed on instruction sheet.  Bring your C-PAP to the hospital with you in case you may have to spend the night.   NOW!  Stop Anti-inflammatories (NSAIDS) such as Advil, Aleve, Ibuprofen, Motrin, Naproxen, Naprosyn and Aspirin based products  such as Excedrin, Goodys Powder, BC Powder. (May take Tylenol or Acetaminophen if needed.)  NOW!  Stop ANY OVER THE COUNTER supplements until after surgery. (VITAMIN E) (May continue Vitamin D, Vitamin B, and multivitamin.)  Wear comfortable clothing (specific to your surgery type) to the hospital.  Plan for stool softeners for home use.  If you are being discharged the day of surgery, you will not be allowed to drive home. You will need a responsible adult to drive you home and stay with you that night.   If you are taking public transportation, you will need to have a responsible adult with you. Please confirm with your physician that it is acceptable to use public transportation.   Please call [phone] if you have any questions about these instructions.

## 2017-08-09 ENCOUNTER — Other Ambulatory Visit: Payer: PPO

## 2017-08-09 ENCOUNTER — Encounter (INDEPENDENT_AMBULATORY_CARE_PROVIDER_SITE_OTHER): Payer: Self-pay

## 2017-08-09 DIAGNOSIS — N186 End stage renal disease: Secondary | ICD-10-CM | POA: Diagnosis not present

## 2017-08-09 DIAGNOSIS — Z992 Dependence on renal dialysis: Secondary | ICD-10-CM | POA: Diagnosis not present

## 2017-08-11 DIAGNOSIS — N2581 Secondary hyperparathyroidism of renal origin: Secondary | ICD-10-CM | POA: Diagnosis not present

## 2017-08-11 DIAGNOSIS — D631 Anemia in chronic kidney disease: Secondary | ICD-10-CM | POA: Diagnosis not present

## 2017-08-11 DIAGNOSIS — N186 End stage renal disease: Secondary | ICD-10-CM | POA: Diagnosis not present

## 2017-08-11 DIAGNOSIS — Z23 Encounter for immunization: Secondary | ICD-10-CM | POA: Diagnosis not present

## 2017-08-11 DIAGNOSIS — D509 Iron deficiency anemia, unspecified: Secondary | ICD-10-CM | POA: Diagnosis not present

## 2017-08-11 DIAGNOSIS — Z992 Dependence on renal dialysis: Secondary | ICD-10-CM | POA: Diagnosis not present

## 2017-08-15 DIAGNOSIS — E119 Type 2 diabetes mellitus without complications: Secondary | ICD-10-CM | POA: Diagnosis not present

## 2017-08-15 DIAGNOSIS — N186 End stage renal disease: Secondary | ICD-10-CM | POA: Diagnosis not present

## 2017-08-15 DIAGNOSIS — G629 Polyneuropathy, unspecified: Secondary | ICD-10-CM | POA: Diagnosis not present

## 2017-08-15 DIAGNOSIS — R7309 Other abnormal glucose: Secondary | ICD-10-CM | POA: Diagnosis not present

## 2017-08-15 DIAGNOSIS — G4733 Obstructive sleep apnea (adult) (pediatric): Secondary | ICD-10-CM | POA: Diagnosis not present

## 2017-08-15 DIAGNOSIS — G932 Benign intracranial hypertension: Secondary | ICD-10-CM | POA: Diagnosis not present

## 2017-08-15 DIAGNOSIS — I10 Essential (primary) hypertension: Secondary | ICD-10-CM | POA: Diagnosis not present

## 2017-08-16 MED ORDER — CEFAZOLIN SODIUM-DEXTROSE 2-4 GM/100ML-% IV SOLN: 2.0000 g | INTRAVENOUS | Status: DC

## 2017-08-17 ENCOUNTER — Telehealth: Payer: Self-pay

## 2017-08-17 ENCOUNTER — Encounter: Payer: Self-pay | Admitting: Certified Registered"

## 2017-08-17 ENCOUNTER — Encounter
Admission: RE | Disposition: A | Discharge status: Home / Self Care | Payer: Self-pay | Source: Ambulatory Visit | Attending: Vascular Surgery

## 2017-08-17 ENCOUNTER — Ambulatory Visit
Admission: RE | Admit: 2017-08-17 | Discharge: 2017-08-17 | Disposition: A | Discharge status: Home / Self Care | Payer: PPO | Source: Ambulatory Visit | Attending: Vascular Surgery | Admitting: Vascular Surgery

## 2017-08-17 DIAGNOSIS — N186 End stage renal disease: Secondary | ICD-10-CM | POA: Diagnosis not present

## 2017-08-17 LAB — URINE DRUG SCREEN, QUALITATIVE (ARMC ONLY)
Amphetamines, Ur Screen: NOT DETECTED
BARBITURATES, UR SCREEN: NOT DETECTED
BENZODIAZEPINE, UR SCRN: NOT DETECTED
Cannabinoid 50 Ng, Ur ~~LOC~~: NOT DETECTED
Cocaine Metabolite,Ur ~~LOC~~: POSITIVE — AB
MDMA (Ecstasy)Ur Screen: NOT DETECTED
METHADONE SCREEN, URINE: NOT DETECTED
OPIATE, UR SCREEN: NOT DETECTED
Phencyclidine (PCP) Ur S: NOT DETECTED
TRICYCLIC, UR SCREEN: NOT DETECTED

## 2017-08-17 LAB — GLUCOSE, CAPILLARY: Glucose-Capillary: 127 mg/dL — ABNORMAL HIGH (ref 65–99)

## 2017-08-17 SURGERY — AV FISTULA INSERTION W/RF MAGNETIC GUIDANCE
Anesthesia: General | Laterality: Left

## 2017-08-17 MED ORDER — SUCCINYLCHOLINE CHLORIDE 20 MG/ML IJ SOLN
INTRAMUSCULAR | Status: AC
  Filled 2017-08-17: qty 1

## 2017-08-17 MED ORDER — ROCURONIUM BROMIDE 50 MG/5ML IV SOLN
INTRAVENOUS | Status: AC
  Filled 2017-08-17: qty 1

## 2017-08-17 MED ORDER — FENTANYL CITRATE (PF) 100 MCG/2ML IJ SOLN
INTRAMUSCULAR | Status: AC
  Filled 2017-08-17: qty 2

## 2017-08-17 MED ORDER — CHLORHEXIDINE GLUCONATE CLOTH 2 % EX PADS: 6.0000 | MEDICATED_PAD | Freq: Once | CUTANEOUS | Status: DC

## 2017-08-17 MED ORDER — LIDOCAINE HCL (PF) 2 % IJ SOLN
INTRAMUSCULAR | Status: AC
  Filled 2017-08-17: qty 10

## 2017-08-17 MED ORDER — SODIUM CHLORIDE 0.9 % IV SOLN: INTRAVENOUS | Status: DC

## 2017-08-17 MED ORDER — FAMOTIDINE 20 MG PO TABS: 20.0000 mg | ORAL_TABLET | Freq: Once | ORAL | Status: DC

## 2017-08-17 NOTE — Discharge Instructions (Signed)
AV Fistula Placement, Care After °Refer to this sheet in the next few weeks. These instructions provide you with information about caring for yourself after your procedure. Your health care provider may also give you more specific instructions. Your treatment has been planned according to current medical practices, but problems sometimes occur. Call your health care provider if you have any problems or questions after your procedure. °What can I expect after the procedure? °After your procedure, it is common to: °· Feel sore. °· Have numbness. °· Feel cold. °· Feel a vibration (thrill) over the fistula. ° °Follow these instructions at home: ° °Incision care °· Do not take baths or showers, swim, or use a hot tub until your health care provider approves. °· Keep the area around your cut from surgery (incision) clean and dry. °· Follow instructions from your health care provider about how to take care of your incision. Make sure you: °? Wash your hands with soap and water before you change your bandage (dressing). If soap and water are not available, use hand sanitizer. °? Change your dressing as told by your health care provider. °? Leave stitches (sutures) and clips in place. They may need to stay in place for 2 weeks or longer. °Fistula Care °· Check your fistula site every day to make sure the thrill feels the same. °· Check your fistula site every day for signs of infection. Watch for: °? Redness. °? Swelling. °? Discharge. °? Tenderness. °? Enlargement. °· Keep your arm raised (elevated) while you rest. °· Do not lift anything that is heavier than a gallon of milk with the arm that has the fistula. °· Do not lie down on your fistula arm. °· Do not let anyone draw blood or take a blood pressure reading on your fistula arm. °· Do not wear tight jewelry or clothing over your fistula arm. °General instructions °· Rest at home for a day or two. °· Gradually start doing your usual activities again. Ask your surgeon  when you can return to work or school. °· Take over-the-counter and prescription medicines only as told by your health care provider. °· Keep all follow-up visits as told by your health care provider. This is important. °Contact a health care provider if: °· You have chills or a fever. °· You have pain at your fistula site that is not going away. °· You have numbness or coldness at your fistula site that is not going away. °· You feel a decrease or a change in the thrill. °· You have swelling in your arm or hand. °· You have redness, swelling, discharge, tenderness, or enlargement at your fistula site. °Get help right away if: °· You are bleeding from your fistula site. °· You have chest pain. °· You have trouble breathing. °This information is not intended to replace advice given to you by your health care provider. Make sure you discuss any questions you have with your health care provider. °Document Released: 03/29/2005 Document Revised: 08/06/2015 Document Reviewed: 06/19/2014 °Elsevier Interactive Patient Education © 2018 [employer] ° °

## 2017-08-17 NOTE — Telephone Encounter (Signed)
Called pt to schedule referral from Dr. Baltazar Apo for sleep apnea, notes placed in pulmonary referral box/

## 2017-08-18 ENCOUNTER — Telehealth (INDEPENDENT_AMBULATORY_CARE_PROVIDER_SITE_OTHER): Payer: Self-pay | Admitting: Vascular Surgery

## 2017-08-18 NOTE — Telephone Encounter (Signed)
Patient would like someone to call him back so that he can get his surgery rescheduled. [phone].

## 2017-08-22 ENCOUNTER — Other Ambulatory Visit (INDEPENDENT_AMBULATORY_CARE_PROVIDER_SITE_OTHER): Payer: Self-pay | Admitting: Vascular Surgery

## 2017-08-22 ENCOUNTER — Encounter (INDEPENDENT_AMBULATORY_CARE_PROVIDER_SITE_OTHER): Payer: Self-pay

## 2017-08-22 DIAGNOSIS — Z79899 Other long term (current) drug therapy: Secondary | ICD-10-CM | POA: Diagnosis not present

## 2017-08-22 DIAGNOSIS — N5201 Erectile dysfunction due to arterial insufficiency: Secondary | ICD-10-CM | POA: Diagnosis not present

## 2017-08-22 DIAGNOSIS — E291 Testicular hypofunction: Secondary | ICD-10-CM | POA: Diagnosis not present

## 2017-09-01 ENCOUNTER — Ambulatory Visit (INDEPENDENT_AMBULATORY_CARE_PROVIDER_SITE_OTHER): Payer: PPO | Admitting: Vascular Surgery

## 2017-09-01 MED ORDER — CEFAZOLIN SODIUM-DEXTROSE 2-4 GM/100ML-% IV SOLN
2.0000 g | INTRAVENOUS | Status: AC
  Administered 2017-09-02: 2 g via INTRAVENOUS

## 2017-09-02 ENCOUNTER — Encounter
Admission: RE | Disposition: A | Discharge status: Home / Self Care | Payer: Self-pay | Source: Ambulatory Visit | Attending: Vascular Surgery

## 2017-09-02 ENCOUNTER — Ambulatory Visit: Payer: PPO | Admitting: Anesthesiology

## 2017-09-02 ENCOUNTER — Ambulatory Visit
Admission: RE | Admit: 2017-09-02 | Discharge: 2017-09-02 | Disposition: A | Discharge status: Home / Self Care | Payer: PPO | Source: Ambulatory Visit | Attending: Vascular Surgery | Admitting: Vascular Surgery

## 2017-09-02 DIAGNOSIS — I10 Essential (primary) hypertension: Secondary | ICD-10-CM | POA: Diagnosis not present

## 2017-09-02 DIAGNOSIS — Z888 Allergy status to other drugs, medicaments and biological substances status: Secondary | ICD-10-CM | POA: Diagnosis not present

## 2017-09-02 DIAGNOSIS — J45909 Unspecified asthma, uncomplicated: Secondary | ICD-10-CM | POA: Insufficient documentation

## 2017-09-02 DIAGNOSIS — N185 Chronic kidney disease, stage 5: Secondary | ICD-10-CM | POA: Diagnosis not present

## 2017-09-02 DIAGNOSIS — E785 Hyperlipidemia, unspecified: Secondary | ICD-10-CM | POA: Diagnosis not present

## 2017-09-02 DIAGNOSIS — F17210 Nicotine dependence, cigarettes, uncomplicated: Secondary | ICD-10-CM | POA: Insufficient documentation

## 2017-09-02 DIAGNOSIS — Z9889 Other specified postprocedural states: Secondary | ICD-10-CM | POA: Diagnosis not present

## 2017-09-02 DIAGNOSIS — M1990 Unspecified osteoarthritis, unspecified site: Secondary | ICD-10-CM | POA: Diagnosis not present

## 2017-09-02 DIAGNOSIS — T82868A Thrombosis of vascular prosthetic devices, implants and grafts, initial encounter: Secondary | ICD-10-CM | POA: Diagnosis not present

## 2017-09-02 DIAGNOSIS — E1122 Type 2 diabetes mellitus with diabetic chronic kidney disease: Secondary | ICD-10-CM | POA: Insufficient documentation

## 2017-09-02 DIAGNOSIS — Z833 Family history of diabetes mellitus: Secondary | ICD-10-CM | POA: Insufficient documentation

## 2017-09-02 DIAGNOSIS — G4730 Sleep apnea, unspecified: Secondary | ICD-10-CM | POA: Insufficient documentation

## 2017-09-02 DIAGNOSIS — Z6841 Body Mass Index (BMI) 40.0 and over, adult: Secondary | ICD-10-CM | POA: Diagnosis not present

## 2017-09-02 DIAGNOSIS — G2581 Restless legs syndrome: Secondary | ICD-10-CM | POA: Insufficient documentation

## 2017-09-02 DIAGNOSIS — N400 Enlarged prostate without lower urinary tract symptoms: Secondary | ICD-10-CM | POA: Diagnosis not present

## 2017-09-02 DIAGNOSIS — E1140 Type 2 diabetes mellitus with diabetic neuropathy, unspecified: Secondary | ICD-10-CM | POA: Insufficient documentation

## 2017-09-02 DIAGNOSIS — Z8249 Family history of ischemic heart disease and other diseases of the circulatory system: Secondary | ICD-10-CM | POA: Insufficient documentation

## 2017-09-02 DIAGNOSIS — F1211 Cannabis abuse, in remission: Secondary | ICD-10-CM | POA: Diagnosis not present

## 2017-09-02 DIAGNOSIS — N186 End stage renal disease: Secondary | ICD-10-CM

## 2017-09-02 DIAGNOSIS — I120 Hypertensive chronic kidney disease with stage 5 chronic kidney disease or end stage renal disease: Secondary | ICD-10-CM | POA: Insufficient documentation

## 2017-09-02 DIAGNOSIS — E119 Type 2 diabetes mellitus without complications: Secondary | ICD-10-CM | POA: Diagnosis not present

## 2017-09-02 DIAGNOSIS — I871 Compression of vein: Secondary | ICD-10-CM | POA: Diagnosis not present

## 2017-09-02 DIAGNOSIS — Z992 Dependence on renal dialysis: Secondary | ICD-10-CM | POA: Diagnosis not present

## 2017-09-02 HISTORY — PX: AV FISTULA INSERTION W/ RF MAGNETIC GUIDANCE: CATH118308

## 2017-09-02 LAB — CBC WITH DIFFERENTIAL/PLATELET
BASOS ABS: 0 10*3/uL (ref 0–0.1)
Basophils Relative: 0 %
EOS ABS: 0.1 10*3/uL (ref 0–0.7)
EOS PCT: 1 %
HCT: 35.9 % — ABNORMAL LOW (ref 40.0–52.0)
Hemoglobin: 12.3 g/dL — ABNORMAL LOW (ref 13.0–18.0)
LYMPHS PCT: 11 %
Lymphs Abs: 1 10*3/uL (ref 1.0–3.6)
MCH: 31.4 pg (ref 26.0–34.0)
MCHC: 34.2 g/dL (ref 32.0–36.0)
MCV: 92 fL (ref 80.0–100.0)
Monocytes Absolute: 0.6 10*3/uL (ref 0.2–1.0)
Monocytes Relative: 7 %
Neutro Abs: 6.9 10*3/uL — ABNORMAL HIGH (ref 1.4–6.5)
Neutrophils Relative %: 81 %
PLATELETS: 224 10*3/uL (ref 150–440)
RBC: 3.9 MIL/uL — ABNORMAL LOW (ref 4.40–5.90)
RDW: 15.6 % — AB (ref 11.5–14.5)
WBC: 8.6 10*3/uL (ref 3.8–10.6)

## 2017-09-02 LAB — URINE DRUG SCREEN, QUALITATIVE (ARMC ONLY)
Amphetamines, Ur Screen: NOT DETECTED
BARBITURATES, UR SCREEN: NOT DETECTED
Benzodiazepine, Ur Scrn: NOT DETECTED
COCAINE METABOLITE, UR ~~LOC~~: NOT DETECTED
Cannabinoid 50 Ng, Ur ~~LOC~~: NOT DETECTED
MDMA (Ecstasy)Ur Screen: NOT DETECTED
Methadone Scn, Ur: NOT DETECTED
Opiate, Ur Screen: NOT DETECTED
Phencyclidine (PCP) Ur S: NOT DETECTED
TRICYCLIC, UR SCREEN: NOT DETECTED

## 2017-09-02 LAB — PROTIME-INR
INR: 0.9
Prothrombin Time: 12.1 seconds (ref 11.4–15.2)

## 2017-09-02 LAB — BASIC METABOLIC PANEL
Anion gap: 15 (ref 5–15)
BUN: 58 mg/dL — AB (ref 6–20)
CO2: 19 mmol/L — ABNORMAL LOW (ref 22–32)
CREATININE: 14.49 mg/dL — AB (ref 0.61–1.24)
Calcium: 7.8 mg/dL — ABNORMAL LOW (ref 8.9–10.3)
Chloride: 90 mmol/L — ABNORMAL LOW (ref 101–111)
GFR calc Af Amer: 4 mL/min — ABNORMAL LOW (ref >60)
GFR, EST NON AFRICAN AMERICAN: 3 mL/min — AB (ref >60)
GLUCOSE: 84 mg/dL (ref 65–99)
POTASSIUM: 3.8 mmol/L (ref 3.5–5.1)
Sodium: 124 mmol/L — ABNORMAL LOW (ref 135–145)

## 2017-09-02 LAB — GLUCOSE, CAPILLARY: Glucose-Capillary: 80 mg/dL (ref 65–99)

## 2017-09-02 LAB — APTT: APTT: 28 s (ref 24–36)

## 2017-09-02 SURGERY — AV FISTULA INSERTION W/RF MAGNETIC GUIDANCE
Anesthesia: General | Laterality: Left

## 2017-09-02 MED ORDER — ONDANSETRON HCL 4 MG/2ML IJ SOLN
INTRAMUSCULAR | Status: DC | PRN
  Administered 2017-09-02: 4 mg via INTRAVENOUS

## 2017-09-02 MED ORDER — FENTANYL CITRATE (PF) 100 MCG/2ML IJ SOLN
INTRAMUSCULAR | Status: AC
  Filled 2017-09-02: qty 2

## 2017-09-02 MED ORDER — FENTANYL CITRATE (PF) 100 MCG/2ML IJ SOLN: 25.0000 ug | INTRAMUSCULAR | Status: DC | PRN

## 2017-09-02 MED ORDER — MIDAZOLAM HCL 2 MG/2ML IJ SOLN
INTRAMUSCULAR | Status: AC
  Filled 2017-09-02: qty 2

## 2017-09-02 MED ORDER — CHLORHEXIDINE GLUCONATE CLOTH 2 % EX PADS
6.0000 | MEDICATED_PAD | Freq: Once | CUTANEOUS | Status: AC
  Administered 2017-09-02: 6 via TOPICAL

## 2017-09-02 MED ORDER — PROPOFOL 10 MG/ML IV BOLUS
INTRAVENOUS | Status: AC
  Filled 2017-09-02: qty 20

## 2017-09-02 MED ORDER — LIDOCAINE HCL (PF) 2 % IJ SOLN
INTRAMUSCULAR | Status: AC
  Filled 2017-09-02: qty 10

## 2017-09-02 MED ORDER — MIDAZOLAM HCL 2 MG/2ML IJ SOLN
INTRAMUSCULAR | Status: DC | PRN
  Administered 2017-09-02: 2 mg via INTRAVENOUS

## 2017-09-02 MED ORDER — NITROGLYCERIN 1 MG/10 ML FOR IR/CATH LAB
INTRA_ARTERIAL | Status: DC | PRN
  Administered 2017-09-02: 250 ug via INTRA_ARTERIAL

## 2017-09-02 MED ORDER — NEOSTIGMINE METHYLSULFATE 10 MG/10ML IV SOLN
INTRAVENOUS | Status: DC | PRN
  Administered 2017-09-02: 4 mg via INTRAVENOUS

## 2017-09-02 MED ORDER — IOPAMIDOL (ISOVUE-300) INJECTION 61%
INTRAVENOUS | Status: DC | PRN
  Administered 2017-09-02: 70 mL via INTRAVENOUS

## 2017-09-02 MED ORDER — ONDANSETRON HCL 4 MG/2ML IJ SOLN
INTRAMUSCULAR | Status: AC
  Filled 2017-09-02: qty 2

## 2017-09-02 MED ORDER — CHLORHEXIDINE GLUCONATE CLOTH 2 % EX PADS: 6.0000 | MEDICATED_PAD | Freq: Once | CUTANEOUS | Status: DC

## 2017-09-02 MED ORDER — SODIUM CHLORIDE 0.9 % IV SOLN
INTRAVENOUS | Status: DC | PRN
  Administered 2017-09-02: 15:00:00 via INTRAVENOUS

## 2017-09-02 MED ORDER — FENTANYL CITRATE (PF) 100 MCG/2ML IJ SOLN
INTRAMUSCULAR | Status: DC | PRN
  Administered 2017-09-02 (×4): 50 ug via INTRAVENOUS

## 2017-09-02 MED ORDER — VASOPRESSIN 20 UNIT/ML IV SOLN
INTRAVENOUS | Status: DC | PRN
Start: 2017-09-02 — End: 2017-09-02
  Administered 2017-09-02: 2 [IU] via INTRAVENOUS
  Administered 2017-09-02 (×3): 1 [IU] via INTRAVENOUS
  Administered 2017-09-02: 2 [IU] via INTRAVENOUS

## 2017-09-02 MED ORDER — ROCURONIUM BROMIDE 50 MG/5ML IV SOLN
INTRAVENOUS | Status: AC
  Filled 2017-09-02: qty 1

## 2017-09-02 MED ORDER — HYDROCODONE-ACETAMINOPHEN 5-325 MG PO TABS: 1.0000 | ORAL_TABLET | Freq: Four times a day (QID) | ORAL | 0 refills | Status: DC | PRN

## 2017-09-02 MED ORDER — ROCURONIUM BROMIDE 100 MG/10ML IV SOLN
INTRAVENOUS | Status: DC | PRN
  Administered 2017-09-02: 20 mg via INTRAVENOUS
  Administered 2017-09-02: 50 mg via INTRAVENOUS
  Administered 2017-09-02: 10 mg via INTRAVENOUS

## 2017-09-02 MED ORDER — ONDANSETRON HCL 4 MG/2ML IJ SOLN: 4.0000 mg | Freq: Once | INTRAMUSCULAR | Status: DC | PRN

## 2017-09-02 MED ORDER — SUCCINYLCHOLINE CHLORIDE 20 MG/ML IJ SOLN
INTRAMUSCULAR | Status: AC
  Filled 2017-09-02: qty 1

## 2017-09-02 MED ORDER — SUCCINYLCHOLINE CHLORIDE 20 MG/ML IJ SOLN
INTRAMUSCULAR | Status: DC | PRN
  Administered 2017-09-02: 140 mg via INTRAVENOUS

## 2017-09-02 MED ORDER — PHENYLEPHRINE HCL 10 MG/ML IJ SOLN
INTRAMUSCULAR | Status: DC | PRN
  Administered 2017-09-02 (×2): 100 ug via INTRAVENOUS
  Administered 2017-09-02 (×2): 200 ug via INTRAVENOUS
  Administered 2017-09-02: 100 ug via INTRAVENOUS
  Administered 2017-09-02 (×3): 200 ug via INTRAVENOUS

## 2017-09-02 MED ORDER — HEPARIN SODIUM (PORCINE) 1000 UNIT/ML IJ SOLN
INTRAMUSCULAR | Status: DC | PRN
  Administered 2017-09-02: 4000 [IU] via INTRAVENOUS

## 2017-09-02 MED ORDER — PROPOFOL 10 MG/ML IV BOLUS
INTRAVENOUS | Status: DC | PRN
  Administered 2017-09-02: 50 mg via INTRAVENOUS
  Administered 2017-09-02: 150 mg via INTRAVENOUS

## 2017-09-02 MED ORDER — LIDOCAINE HCL (CARDIAC) PF 100 MG/5ML IV SOSY
PREFILLED_SYRINGE | INTRAVENOUS | Status: DC | PRN
  Administered 2017-09-02: 100 mg via INTRAVENOUS

## 2017-09-02 MED ORDER — ESMOLOL HCL 100 MG/10ML IV SOLN
INTRAVENOUS | Status: DC | PRN
  Administered 2017-09-02: 30 mg via INTRAVENOUS

## 2017-09-02 MED ORDER — GLYCOPYRROLATE 0.2 MG/ML IJ SOLN
INTRAMUSCULAR | Status: DC | PRN
  Administered 2017-09-02: 0.1 mg via INTRAVENOUS
  Administered 2017-09-02: 0.6 mg via INTRAVENOUS
  Administered 2017-09-02: 0.2 mg via INTRAVENOUS

## 2017-09-02 MED ORDER — LIDOCAINE HCL (PF) 1 % IJ SOLN
INTRAMUSCULAR | Status: AC
  Filled 2017-09-02: qty 30

## 2017-09-02 SURGICAL SUPPLY — 23 items
BALLN ULTRVRSE 7X40X130C (BALLOONS) ×3
BALLOON ULTRVRSE 7X40X130C (BALLOONS) ×1 IMPLANT
CATH BEACON 5 .035 40 KMP TP (CATHETERS) ×1 IMPLANT
CATH BEACON 5 .038 40 KMP TP (CATHETERS) ×2
CATH LANTERN 025 115CM 45TIP (MISCELLANEOUS) ×3 IMPLANT
CATH WAVELINQ 6FR (CATHETERS) ×3 IMPLANT
COIL 400 COMPLEX SOFT 6X20CM (Vascular Products) ×3 IMPLANT
COIL 400 COMPLEX STD 10X35CM (Vascular Products) ×3 IMPLANT
COIL 400 COMPLEX STD 6X30CM (Vascular Products) ×3 IMPLANT
COIL 400 COMPLEX STD 7X25CM (Vascular Products) ×3 IMPLANT
DEVICE PRESTO INFLATION (MISCELLANEOUS) ×3 IMPLANT
DRAPE BRACHIAL (DRAPES) ×3 IMPLANT
HANDLE DETACHMENT COIL (MISCELLANEOUS) ×3 IMPLANT
SET INTRO CAPELLA COAXIAL (SET/KITS/TRAYS/PACK) ×6 IMPLANT
SHEATH  7FR 11CM 018 (SHEATH) ×2
SHEATH 6FR 11CM 018 (SHEATH) ×3 IMPLANT
SHEATH 7FR 11CM 018 (SHEATH) ×1 IMPLANT
SHIELD RADPAD DADD DRAPE 4X9 (MISCELLANEOUS) ×3 IMPLANT
TOWEL OR 17X26 4PK STRL BLUE (TOWEL DISPOSABLE) ×6 IMPLANT
VALVE HEMO TOUHY BORST Y (ADAPTER) ×6 IMPLANT
WIRE G 018X200 V18 (WIRE) ×9 IMPLANT
WIRE G V-18X110 (WIRE) ×3 IMPLANT
WIRE MAGIC TORQUE 260C (WIRE) ×3 IMPLANT

## 2017-09-02 NOTE — Anesthesia Procedure Notes (Signed)
Procedure Name: Intubation Date/Time: 09/02/2017 3:20 PM Performed by: Aline Brochure, CRNA Pre-anesthesia Checklist: Patient identified, Emergency Drugs available, Suction available and Patient being monitored Patient Re-evaluated:Patient Re-evaluated prior to induction Oxygen Delivery Method: Circle system utilized Preoxygenation: Pre-oxygenation with 100% oxygen Induction Type: IV induction Ventilation: Mask ventilation without difficulty Laryngoscope Size: Glidescope and 4 Grade View: Grade I Tube type: Oral Tube size: 7.5 mm Number of attempts: 1 Airway Equipment and Method: Video-laryngoscopy and Rigid stylet Placement Confirmation: ETT inserted through vocal cords under direct vision,  positive ETCO2 and breath sounds checked- equal and bilateral Secured at: 23 cm Tube secured with: Tape Dental Injury: Teeth and Oropharynx as per pre-operative assessment  Difficulty Due To: Difficulty was anticipated, Difficult Airway- due to large tongue and Difficult Airway- due to anterior larynx Future Recommendations: Recommend- induction with short-acting agent, and alternative techniques readily available

## 2017-09-02 NOTE — Anesthesia Preprocedure Evaluation (Addendum)
Anesthesia Evaluation  Patient identified by MRN, date of birth, ID band Patient awake    Reviewed: Allergy & Precautions, H&P , NPO status , Patient's Chart, lab work & pertinent test results, reviewed documented beta blocker date and time   History of Anesthesia Complications Negative for: history of anesthetic complications  Airway Mallampati: IV  TM Distance: >3 FB Neck ROM: full    Dental  (+) Missing, Dental Advidsory Given   Pulmonary neg shortness of breath, asthma , sleep apnea and Continuous Positive Airway Pressure Ventilation , neg COPD, neg recent URI, Current Smoker,           Cardiovascular Exercise Tolerance: Good hypertension, On Medications and On Home Beta Blockers (-) angina(-) CAD, (-) Past MI, (-) Cardiac Stents and (-) CABG (-) dysrhythmias (-) Valvular Problems/Murmurs     Neuro/Psych neg Seizures  Neuromuscular disease (peripheral neuropathy) negative psych ROS   GI/Hepatic negative GI ROS, Neg liver ROS, GERD  ,(+)     substance abuse  cocaine use,   Endo/Other  diabetes, Oral Hypoglycemic AgentsMorbid obesity  Renal/GU CRFRenal disease  negative genitourinary   Musculoskeletal  (+) Arthritis , Osteoarthritis,    Abdominal   Peds  Hematology negative hematology ROS (+)   Anesthesia Other Findings Past Medical History: No date: Asthma     Comment: triggered by enviornmental allergies No date: Diabetes mellitus without complication ([facility]) No date: Hypertension No date: Renal disorder     Comment: CKD4   Reproductive/Obstetrics negative OB ROS                             Anesthesia Physical  Anesthesia Plan  ASA: III  Anesthesia Plan: General ETT and General   Post-op Pain Management:    Induction: Rapid sequence, Cricoid pressure planned and Intravenous  PONV Risk Score and Plan:   Airway Management Planned: Oral ETT  Additional Equipment:    Intra-op Plan:   Post-operative Plan: Extubation in OR  Informed Consent: I have reviewed the patients History and Physical, chart, labs and discussed the procedure including the risks, benefits and alternatives for the proposed anesthesia with the patient or authorized representative who has indicated his/her understanding and acceptance.   Dental Advisory Given  Plan Discussed with: Anesthesiologist, CRNA and Surgeon  Anesthesia Plan Comments:         Anesthesia Quick Evaluation

## 2017-09-02 NOTE — H&P (Signed)
Donald Fernandez Admission History & Physical  MRN : [id number]  Donald Fernandez is a 47 y.o. (03-31-1971) male who presents with chief complaint of No chief complaint on file. Marland Kitchen  History of Present Illness:   The patient presents to [facility] today for creation of dialysis access. The patient has chronic renal insufficiency stage V secondary to hypertension. The patient's most recent creatinine clearance is less than 25. The patient volume status has not yet become an issue. Patient's blood pressures been relatively well controlled. There are mild uremic symptoms which appear to be relatively well tolerated at this time. The patient is right-handed.  The patient has been considering the various methods of dialysis and wishes to proceed with hemodialysis and therefore creation of AV access.  The patient denies amaurosis fugax or recent TIA symptoms. There are no recent neurological changes noted. The patient denies claudication symptoms or rest pain symptoms. The patient denies history of DVT, PE or superficial thrombophlebitis. The patient denies recent episodes of angina or shortness of breath.     Current Facility-Administered Medications  Medication Dose Route Frequency Provider Last Rate Last Dose  . ceFAZolin (ANCEF) IVPB 2g/100 mL premix  2 g Intravenous On Call to [place], PA-C      . Chlorhexidine Gluconate Cloth 2 % PADS 6 each  6 each Topical Once Stegmayer, Janalyn Harder, PA-C        Past Medical History:  Diagnosis Date  . Arthritis   . Asthma    triggered by enviornmental allergies  . BPH (benign prostatic hyperplasia)   . Diabetes mellitus without complication ([place])   . Dialysis patient [facility])    dialysis T-TH-SAT  . GERD (gastroesophageal reflux disease)   . Gout   . Hyperlipidemia   . Hypertension   . Neuropathy   . Renal disorder    CKD4; end stage renal disease  . Restless legs   . Sleep apnea    uses cpap    Past Surgical History:  Procedure Laterality Date  . A/V FISTULAGRAM N/A 06/08/2017   Procedure: A/V FISTULAGRAM;  Surgeon: Algernon Huxley, MD;  Location: [facility];  Service: Cardiovascular;  Laterality: N/A;  . ANKLE SURGERY Right    fracture repair with pins, plates, screws  . AV FISTULA PLACEMENT Left 08/04/2016   Procedure: ARTERIOVENOUS (AV) FISTULA CREATION ( RADIOCEPHALIC );  Surgeon: Katha Cabal, MD;  Location: [facility];  Service: Vascular;  Laterality: Left;  . DIALYSIS/PERMA CATHETER INSERTION N/A 06/06/2017   Procedure: DIALYSIS/PERMA CATHETER INSERTION;  Surgeon: Algernon Huxley, MD;  Location: [facility];  Service: Cardiovascular;  Laterality: N/A;  . IR ANGIO INTRA EXTRACRAN SEL INTERNAL CAROTID UNI R MOD SED  12/17/2016  . RADIOLOGY WITH ANESTHESIA N/A 12/17/2016   Procedure: Arteriogram/Venogram with right venous sinus manometry;  Surgeon: Consuella Lose, MD;  Location: [facility];  Service: Radiology;  Laterality: N/A;    Social History Social History   Tobacco Use  . Smoking status: Current Every Day Smoker    Types: Cigarettes  . Smokeless tobacco: Never Used  . Tobacco comment: APPROX. 1 PK/WEEK   Substance Use Topics  . Alcohol use: Yes    Comment: weekends  . Drug use: Yes    Types: Marijuana    Comment: LAST TIME APPROX. 08/03/17    Family History Family History  Problem Relation Age of Onset  . AAA (abdominal aortic aneurysm) Mother   .  Diabetes Mother   . Heart disease Father   No family history of bleeding/clotting disorders, porphyria or autoimmune disease   Allergies  Allergen Reactions  . Accupril [Quinapril Hcl] Anaphylaxis    Angioedema  . Ace Inhibitors Anaphylaxis    Angioedema     REVIEW OF SYSTEMS (Negative unless checked)  Constitutional: [] Weight loss  [] Fever  [] Chills Cardiac: [] Chest pain   [] Chest pressure   [] Palpitations   [] Shortness of breath when laying flat   [] Shortness of breath at rest    [] Shortness of breath with exertion. Vascular:  [] Pain in legs with walking   [] Pain in legs at rest   [] Pain in legs when laying flat   [] Claudication   [] Pain in feet when walking  [] Pain in feet at rest  [] Pain in feet when laying flat   [] History of DVT   [] Phlebitis   [] Swelling in legs   [] Varicose veins   [] Non-healing ulcers Pulmonary:   [] Uses home oxygen   [] Productive cough   [] Hemoptysis   [] Wheeze  [] COPD   [] Asthma Neurologic:  [] Dizziness  [] Blackouts   [] Seizures   [] History of stroke   [] History of TIA  [] Aphasia   [] Temporary blindness   [] Dysphagia   [] Weakness or numbness in arms   [] Weakness or numbness in legs Musculoskeletal:  [] Arthritis   [] Joint swelling   [] Joint pain   [] Low back pain Hematologic:  [] Easy bruising  [] Easy bleeding   [] Hypercoagulable state   [] Anemic  [] Hepatitis Gastrointestinal:  [] Blood in stool   [] Vomiting blood  [] Gastroesophageal reflux/heartburn   [] Difficulty swallowing. Genitourinary:  [x] Chronic kidney disease   [] Difficult urination  [] Frequent urination  [] Burning with urination   [] Blood in urine Skin:  [] Rashes   [] Ulcers   [] Wounds Psychological:  [] History of anxiety   []  History of major depression.  Physical Examination  Vitals:   09/02/17 1125  BP: (!) 134/93  Pulse: 97  Resp: 20  Temp: 98.2 F (36.8 C)  TempSrc: Oral  SpO2: 97%  Weight: (!) 343 lb (155.6 kg)  Height: 5\' 11"  (1.803 m)   Body mass index is 47.84 kg/m. Gen: WD/WN, NAD Head: [place]/AT, No temporalis wasting. Prominent temp pulse not noted. Ear/Nose/Throat: Hearing grossly intact, nares w/o erythema or drainage, oropharynx w/o Erythema/Exudate,  Eyes: Conjunctiva clear, sclera non-icteric Neck: Trachea midline.  No JVD.  Pulmonary:  Good air movement, respirations not labored, no use of accessory muscles.  Cardiac: RRR, normal S1, S2. Vascular:  Vessel Right Left  Radial Palpable Palpable  Ulnar Palpable Palpable  Brachial Palpable Palpable  Carotid  Palpable, without bruit Palpable, without bruit  Gastrointestinal: soft, non-tender/non-distended. No guarding/reflex.  Musculoskeletal: M/S 5/5 throughout.  Extremities without ischemic changes.  No deformity or atrophy.  Neurologic: Sensation grossly intact in extremities.  Symmetrical.  Speech is fluent. Motor exam as listed above. Psychiatric: Judgment intact, Mood & affect appropriate for pt's clinical situation. Dermatologic: No rashes or ulcers noted.  No cellulitis or open wounds. Lymph : No Cervical, Axillary, or Inguinal lymphadenopathy.     CBC Lab Results  Component Value Date   WBC 8.6 09/02/2017   HGB 12.3 (L) 09/02/2017   HCT 35.9 (L) 09/02/2017   MCV 92.0 09/02/2017   PLT 224 09/02/2017    BMET    Component Value Date/Time   NA 124 (L) 09/02/2017 1114   NA 145 12/31/2011 1850   K 3.8 09/02/2017 1114   K 3.5 12/31/2011 1850   CL 90 (L) 09/02/2017 1114  CL 108 (H) 12/31/2011 1850   CO2 19 (L) 09/02/2017 1114   CO2 28 12/31/2011 1850   GLUCOSE 84 09/02/2017 1114   GLUCOSE 63 (L) 12/31/2011 1850   BUN 58 (H) 09/02/2017 1114   BUN 14 12/31/2011 1850   CREATININE 14.49 (H) 09/02/2017 1114   CREATININE 1.72 (H) 12/31/2011 1850   CALCIUM 7.8 (L) 09/02/2017 1114   CALCIUM 8.1 (L) 06/06/2017 2038   GFRNONAA 3 (L) 09/02/2017 1114   GFRNONAA 48 (L) 12/31/2011 1850   GFRAA 4 (L) 09/02/2017 1114   GFRAA 55 (L) 12/31/2011 1850   Estimated Creatinine Clearance: 9.5 mL/min (A) (by C-G formula based on SCr of 14.49 mg/dL (H)).  COAG Lab Results  Component Value Date   INR 0.90 09/02/2017   INR 0.91 08/08/2017   INR 0.97 12/17/2016    Radiology No results found.   Assessment/Plan 1. Chronic kidney disease, unspecified CKD stage Recommend:  The patient currently has catheter based access.  Patient should have a fistula created using endovenous techniques, the [facility] system.  The risks, benefits and alternative therapies with respect to to the  different kinds of dialysis accesee were reviewed in detail with the patient.  All questions were answered.  The patient agrees to proceed with mapping.     2. Essential hypertension Continue antihypertensive medications as already ordered, these medications have been reviewed and there are no changes at this time.   3. Diabetes mellitus type 2 in obese [facility]) Continue hypoglycemic medications as already ordered, these medications have been reviewed and there are no changes at this time.  Hgb A1C to be monitored as already arranged by primary service     Hortencia Pilar, MD  09/02/2017 2:54 PM

## 2017-09-02 NOTE — Anesthesia Post-op Follow-up Note (Signed)
Anesthesia QCDR form completed.        

## 2017-09-02 NOTE — Transfer of Care (Signed)
Immediate Anesthesia Transfer of Care Note  Patient: Donald Fernandez  Procedure(s) Performed: AV FISTULA INSERTION W/RF MAGNETIC GUIDANCE (Left )  Patient Location: PACU  Anesthesia Type:General  Level of Consciousness: awake  Airway & Oxygen Therapy: Patient connected to face mask oxygen  Post-op Assessment: Post -op Vital signs reviewed and stable  Post vital signs: stable  Last Vitals:  Vitals Value Taken Time  BP 113/75 09/02/2017  6:36 PM  Temp 36.2 C 09/02/2017  6:30 PM  Pulse 86 09/02/2017  6:38 PM  Resp 20 09/02/2017  6:38 PM  SpO2 99 % 09/02/2017  6:38 PM  Vitals shown include unvalidated device data.  Last Pain:  Vitals:   09/02/17 1830  TempSrc:   PainSc: Asleep         Complications: No apparent anesthesia complications

## 2017-09-02 NOTE — Op Note (Signed)
[facility]  Percutaneous Study/Intervention Procedural Note   Date of Surgery: 09/02/2017,6:27 PM  Surgeon: Hortencia Pilar  Pre-operative Diagnosis: End-stage renal disease requiring hemodialysis  Post-operative diagnosis:  Same  Procedure(s) Performed:  1.  Placement 7 French venous sheath left brachial vein with ultrasound guidance  2.  Placement 6 French arterial sheath left brachial artery with ultrasound guidance  3.  Left arm arteriography second-order catheter placement with additional second-order  4.  Left arm venography  5.  Creation of an ulnar ulnar fistula using radiofrequency with the [facility] system  6.  Coil embolization of the left brachial vein             7.  Coil embolization left radiocephalic fistula    Anesthesia: General anesthesia  Sheath: 7 French sheath left brachial vein and 6 French sheath left brachial artery  Contrast: 70 cc   Fluoroscopy Time: 28.7 minutes  Indications: Patient presents with end-stage renal disease and will require hemodialysis.  Therefore appropriate upper extremity access is being created.  The patient has been preoperatively vein mapped and is found to be a good candidate for the [facility] radiofrequency system.  Risks and benefits of been reviewed all questions been answered patient has been shown a video of the [place].  The patient agrees to proceed with surgery   Procedure:  Donald Fernandez a 47 y.o. male who was identified and appropriate procedural time out was performed.  The patient was then placed supine on the special procedures table with the left arm extended palm upward and secured to the armboard in the standard fashion.  The left was then prepped and draped in the usual sterile fashion.    Ultrasound was used to evaluate the left brachial vein.  The brachial vein was echolucent and compressible indicating it is patent .  An ultrasound image was acquired for the permanent record.  A  micropuncture needle was used to access the left brachial vein under direct ultrasound guidance.  The microwire was then advanced under fluoroscopic guidance without difficulty followed by the micro-sheath.    Ultrasound was then used to evaluate the left brachial artery.  The brachial artery was echolucent and pulsatile indicating patency.  An ultrasound image was acquired for the permanent record.  A micropuncture needle was then used to access the left brachial artery.  Microwire was then advanced followed by the micro sheath.  Hand-injection of contrast was then used to create the initial arteriogram.  A V 18 wire was then negotiated under direct visualization down into the more distal brachial artery and the micro-sheath was then upsized to a 6 [place] sheath.  V 18 wire and Kumpe catheter were then utilized to first select the common ulnar and subsequently the proper ulnar artery.  Hand-injection contrast was then used to demonstrate the left arm arterial anatomy and the distal ulnar artery.  The initial image demonstrated rapid filling system from the radiocephalic fistula which is still in existence at the wrist.  This obscured the true venous pattern and I decided to shut down the fistula by embolization.  The wire was then negotiated to the radial artery followed by the Kumpe catheter and ultimately the radiocephalic fistula was selected and the Kumpe catheter advanced into the radiocephalic fistula.  This represents an additional second order catheter placement.  Once the Kumpe catheter positioned within the fistula just above the level of the wrist the plantar catheter was advanced and subsequently she will be coils with a  6 mm x 30 cm coils followed by a 6 mm x 20 cm coils.  Follow-up hand-injection of the catheter demonstrated complete absence of flow through the fistula.  The Kumpe catheter was then pulled back to the level of the antecubital fossa follow-up angiogram was performed and the wire and  catheter were negotiated into the ulnar artery.  This represents the other second-order catheter placement.  Once hand-injection and confirmed positioning within the ulnar artery the V 18 wire was reintroduced.    The venous sheath was then upsized to a 7 [place] sheath and using a combination of a Kumpe catheter and a V 18 wire the wire catheter combination was negotiated down into the ulnar vein.  This was accomplished utilizing multiple hand injections to slowly map your way down to this level.  This is required as the contrast will not reflux distally to a competent valve.  Once the 2 wires had been advanced side-by-side the detector was moved across the field with fluoroscopy to choose the view demonstrating the maximal separation between the wires.  Arteriography was now performed by hand in a magnified image centered over the common ulnar artery, the [place] arterial catheter was advanced down to the level of the common ulnar artery and positioned so that the ceramic plate was facing the venous wire.  The [place] venous catheter was then advanced through the 7 French sheath and positioned opposite to the arterial catheter.  The catheters were then visualized in a meticulous fashion to ensure there was no wire wrap that the electrode is facing the ceramic plate and that the magnets are lined up and separated by no greater than 2 mm.  This was accomplished in multiple different obliquities.  The electrode was then deployed.  Ultimately the final view was a complete orthogonal view and a coned in magnified image was used to verify the electrode was against the ceramic plate.  We then returned to the working view that was initially determined as noted above.  Both the arterial and venous wires were then pulled back proximal to the catheters.   Once the catheter position had been verified the RF pulse was delivered the electrode was visually noted to touch the ceramic plate.  The electrode was then  recaptured and the catheter and wire removed from the venous sheath.  Catheter and wire were removed from the arterial sheath.  Hand-injection through the arterial sheath confirmed successful fistula creation.  The venous sheath was then readdressed hand-injection of contrast and a magnified view was used to demonstrate the brachial vein anatomy.  Subsequently, 2 Ruby coils were deployed successfully, a 7 mm x 25 cm and a 10 mm x 35 cm coil, occluding the brachial vein and forcing maximal flow through the fistula  Final fistulogram was then performed confirming rapid flow of contrast through the arteriovenous fistula with filling of the superficial system.  Both sheaths were then removed and manual pressure was held for 20 minutes.   Disposition: Patient was taken to the recovery room in stable condition having tolerated the procedure well.  Donald Fernandez 09/02/2017,6:27 PM

## 2017-09-02 NOTE — Progress Notes (Addendum)
Pt recovered in PACU. Care assumed at [facility] from [facility] in PACU. Pt resting. D/C instruction reviewed including RX per Dr. Delana Meyer. VSS, Pt awake alert oriented,Site CDI. Will continue to monitor closely. Per MD Recovery time 3 hrs Post sheath removal.

## 2017-09-03 NOTE — Anesthesia Postprocedure Evaluation (Signed)
Anesthesia Post Note  Patient: Donald Fernandez  Procedure(s) Performed: AV FISTULA INSERTION W/RF MAGNETIC GUIDANCE (Left )  Patient location during evaluation: PACU Anesthesia Type: General Level of consciousness: awake and alert Pain management: pain level controlled Vital Signs Assessment: post-procedure vital signs reviewed and stable Respiratory status: spontaneous breathing, nonlabored ventilation, respiratory function stable and patient connected to nasal cannula oxygen Cardiovascular status: blood pressure returned to baseline and stable Postop Assessment: no apparent nausea or vomiting Anesthetic complications: no     Last Vitals:  Vitals:   09/02/17 2034 09/02/17 2045  BP: 112/60 107/69  Pulse: 93   Resp: (!) 22   Temp:    SpO2: 95%     Last Pain:  Vitals:   09/02/17 2045  TempSrc:   PainSc: 0-No pain                 Martha Clan

## 2017-09-06 ENCOUNTER — Encounter: Payer: Self-pay | Admitting: Vascular Surgery

## 2017-09-06 ENCOUNTER — Telehealth (INDEPENDENT_AMBULATORY_CARE_PROVIDER_SITE_OTHER): Payer: Self-pay | Admitting: Vascular Surgery

## 2017-09-06 LAB — GLUCOSE, CAPILLARY: Glucose-Capillary: 104 mg/dL — ABNORMAL HIGH (ref 65–99)

## 2017-09-06 NOTE — Telephone Encounter (Signed)
Needs a 3wks fu with GS with HDA per [facility] VM.

## 2017-09-06 NOTE — Telephone Encounter (Signed)
Patient returned my call and I gave him the recommendations from Dr. Delana Meyer and per the patient he has stopped taking the Oxycodone because it doesn't help anyway.

## 2017-09-06 NOTE — Telephone Encounter (Signed)
Attempted to contact the patient to let him know per Dr. Delana Meyer there isn't anything stronger we can give him and he needs to stop doubling his dosage which is dangerous and it will present a problem with the pharmacy. The patient has a voicemail box that is not set up so I was unable to leave a message.

## 2017-09-06 NOTE — Telephone Encounter (Signed)
PT CALLED STATING HE IS IN SEVERE PAIN AND THE OXYCODONE HE WAS PRESCRIBED AFTER HIS PROCEDURE IS NOT WORKING. STATES SHE HAS EVEN DOUBLED THE SUGGESTED DOSAGE AND IT IS NOT HELPING. STATES HE WENT TO DIALYSIS THIS MORNING AND THEY SAID IT DID APPEAR A LITTLE SWOLLEN PLEASE ADVISE. HE STATES HE NEEDS SOMETHING STRONGER

## 2017-09-08 DIAGNOSIS — Z992 Dependence on renal dialysis: Secondary | ICD-10-CM | POA: Diagnosis not present

## 2017-09-09 DIAGNOSIS — Z992 Dependence on renal dialysis: Secondary | ICD-10-CM | POA: Diagnosis not present

## 2017-09-09 DIAGNOSIS — N186 End stage renal disease: Secondary | ICD-10-CM | POA: Diagnosis not present

## 2017-09-10 DIAGNOSIS — N2581 Secondary hyperparathyroidism of renal origin: Secondary | ICD-10-CM | POA: Diagnosis not present

## 2017-09-10 DIAGNOSIS — N186 End stage renal disease: Secondary | ICD-10-CM | POA: Diagnosis not present

## 2017-09-10 DIAGNOSIS — D631 Anemia in chronic kidney disease: Secondary | ICD-10-CM | POA: Diagnosis not present

## 2017-09-10 DIAGNOSIS — Z992 Dependence on renal dialysis: Secondary | ICD-10-CM | POA: Diagnosis not present

## 2017-09-10 DIAGNOSIS — D509 Iron deficiency anemia, unspecified: Secondary | ICD-10-CM | POA: Diagnosis not present

## 2017-09-28 ENCOUNTER — Other Ambulatory Visit (INDEPENDENT_AMBULATORY_CARE_PROVIDER_SITE_OTHER): Payer: Self-pay | Admitting: Vascular Surgery

## 2017-09-28 ENCOUNTER — Encounter (INDEPENDENT_AMBULATORY_CARE_PROVIDER_SITE_OTHER): Payer: Self-pay | Admitting: Vascular Surgery

## 2017-09-28 ENCOUNTER — Ambulatory Visit (INDEPENDENT_AMBULATORY_CARE_PROVIDER_SITE_OTHER): Payer: PPO | Admitting: Vascular Surgery

## 2017-09-28 ENCOUNTER — Ambulatory Visit (INDEPENDENT_AMBULATORY_CARE_PROVIDER_SITE_OTHER): Payer: PPO

## 2017-09-28 VITALS — BP 189/102 | HR 117 | Ht 71.0 in | Wt 335.0 lb

## 2017-09-28 DIAGNOSIS — E1169 Type 2 diabetes mellitus with other specified complication: Secondary | ICD-10-CM

## 2017-09-28 DIAGNOSIS — N186 End stage renal disease: Secondary | ICD-10-CM | POA: Diagnosis not present

## 2017-09-28 DIAGNOSIS — Z992 Dependence on renal dialysis: Secondary | ICD-10-CM

## 2017-09-28 DIAGNOSIS — E669 Obesity, unspecified: Secondary | ICD-10-CM

## 2017-10-04 DIAGNOSIS — Z992 Dependence on renal dialysis: Secondary | ICD-10-CM | POA: Diagnosis not present

## 2017-10-09 DIAGNOSIS — Z992 Dependence on renal dialysis: Secondary | ICD-10-CM | POA: Diagnosis not present

## 2017-10-09 DIAGNOSIS — N186 End stage renal disease: Secondary | ICD-10-CM | POA: Diagnosis not present

## 2017-10-10 ENCOUNTER — Encounter (INDEPENDENT_AMBULATORY_CARE_PROVIDER_SITE_OTHER): Payer: Self-pay

## 2017-10-10 ENCOUNTER — Encounter (INDEPENDENT_AMBULATORY_CARE_PROVIDER_SITE_OTHER): Payer: Self-pay | Admitting: Vascular Surgery

## 2017-10-10 NOTE — Progress Notes (Signed)
Subjective:    Patient ID: Donald Fernandez, male    DOB: 12/24/1970, 47 y.o.   MRN: [id number] Chief Complaint  Patient presents with  . Follow-up    [place] today    The patient presents for his first postoperative follow-up.  The patient is status post a left ulnar to ulnar AVF creation using radiofrequency with wavelinQ system and coil embolization of the left brachial vein and radiocephalic AV fistula on Sep 02, 2017.  The patient presents sooner than his originally scheduled postoperative follow-up at the request of his dialysis center.  The patient's dialysis center is unable to palpate a thrill during their examinations and would like further evaluation by our office.  The patient underwent a left upper extremity dialysis access duplex which was notable for patent left ulnar to ulnar aVF with a significant focal velocity increase in the perforator vein.  Diminished flow of less than 100 cm/s throughout the upper arm cephalic vein with flow appearing to course into the brachial vein.  Decreased outflow vein flow volume as compared to the one obtained in the inflow artery.  The patient is currently being maintained by a PermCath without issue.  The patient denies any uremic symptoms.  Patient denies any fever, nausea vomiting.  Review of Systems  Constitutional: Negative.   HENT: Negative.   Eyes: Negative.   Respiratory: Negative.   Cardiovascular: Negative.   Gastrointestinal: Negative.   Endocrine: Negative.   Genitourinary:       ESRD  Musculoskeletal: Negative.   Skin: Negative.   Allergic/Immunologic: Negative.   Neurological: Negative.   Hematological: Negative.   Psychiatric/Behavioral: Negative.       Objective:   Physical Exam  Constitutional: He is oriented to person, place, and time. He appears well-developed and well-nourished. No distress.  HENT:  Head: Normocephalic and atraumatic.  Right Ear: External ear normal.  Left Ear: External ear normal.  Eyes: Pupils are  equal, round, and reactive to light. Conjunctivae and EOM are normal.  Neck: Normal range of motion.  Cardiovascular: Normal rate, regular rhythm, normal heart sounds and intact distal pulses.  Pulses:      Radial pulses are 2+ on the right side, and 2+ on the left side.  Left dialysis access: Poorly palpable thrill or audible bruit.  Incision is healed well.  Pulmonary/Chest: Effort normal and breath sounds normal.  Musculoskeletal: Normal range of motion. He exhibits no edema.  Neurological: He is alert and oriented to person, place, and time.  Skin: Skin is warm and dry. He is not diaphoretic.  Psychiatric: He has a normal mood and affect. His behavior is normal. Judgment and thought content normal.  Vitals reviewed.  BP (!) 189/102 (BP Location: Right Arm, Patient Position: Sitting)   Pulse (!) 117   Ht 5\' 11"  (1.803 m)   Wt (!) 335 lb (152 kg)   BMI 46.72 kg/m   Past Medical History:  Diagnosis Date  . Arthritis   . Asthma    triggered by enviornmental allergies  . BPH (benign prostatic hyperplasia)   . Diabetes mellitus without complication ([place])   . Dialysis patient [facility])    dialysis T-TH-SAT  . GERD (gastroesophageal reflux disease)   . Gout   . Hyperlipidemia   . Hypertension   . Neuropathy   . Renal disorder    CKD4; end stage renal disease  . Restless legs   . Sleep apnea    uses cpap   Social History   Socioeconomic  History  . Marital status: Single    Spouse name: Not on file  . Number of children: Not on file  . Years of education: Not on file  . Highest education level: Not on file  Occupational History  . Not on file  Social Needs  . Financial resource strain: Not on file  . Food insecurity:    Worry: Not on file    Inability: Not on file  . Transportation needs:    Medical: Not on file    Non-medical: Not on file  Tobacco Use  . Smoking status: Current Every Day Smoker    Types: Cigarettes  . Smokeless tobacco: Never Used  . Tobacco  comment: APPROX. 1 PK/WEEK   Substance and Sexual Activity  . Alcohol use: Yes    Comment: weekends  . Drug use: Yes    Types: Marijuana    Comment: LAST TIME APPROX. 08/03/17  . Sexual activity: Yes    Birth control/protection: Condom  Lifestyle  . Physical activity:    Days per week: Not on file    Minutes per session: Not on file  . Stress: Not on file  Relationships  . Social connections:    Talks on phone: Not on file    Gets together: Not on file    Attends religious service: Not on file    Active member of club or organization: Not on file    Attends meetings of clubs or organizations: Not on file    Relationship status: Not on file  . Intimate partner violence:    Fear of current or ex partner: Not on file    Emotionally abused: Not on file    Physically abused: Not on file    Forced sexual activity: Not on file  Other Topics Concern  . Not on file  Social History Narrative  . Not on file   Past Surgical History:  Procedure Laterality Date  . A/V FISTULAGRAM N/A 06/08/2017   Procedure: A/V FISTULAGRAM;  Surgeon: Algernon Huxley, MD;  Location: [place] [facility];  Service: Cardiovascular;  Laterality: N/A;  . ANKLE SURGERY Right    fracture repair with pins, plates, screws  . AV FISTULA INSERTION W/ RF MAGNETIC GUIDANCE Left 09/02/2017   Procedure: AV FISTULA INSERTION W/RF MAGNETIC GUIDANCE;  Surgeon: Katha Cabal, MD;  Location: [place] [facility];  Service: Cardiovascular;  Laterality: Left;  . AV FISTULA PLACEMENT Left 08/04/2016   Procedure: ARTERIOVENOUS (AV) FISTULA CREATION ( RADIOCEPHALIC );  Surgeon: Katha Cabal, MD;  Location: [facility];  Service: Vascular;  Laterality: Left;  . DIALYSIS/PERMA CATHETER INSERTION N/A 06/06/2017   Procedure: DIALYSIS/PERMA CATHETER INSERTION;  Surgeon: Algernon Huxley, MD;  Location: [place] [facility];  Service: Cardiovascular;  Laterality: N/A;  . IR ANGIO INTRA EXTRACRAN SEL INTERNAL CAROTID UNI R MOD SED   12/17/2016  . RADIOLOGY WITH ANESTHESIA N/A 12/17/2016   Procedure: Arteriogram/Venogram with right venous sinus manometry;  Surgeon: Consuella Lose, MD;  Location: [place];  Service: Radiology;  Laterality: N/A;   Family History  Problem Relation Age of Onset  . AAA (abdominal aortic aneurysm) Mother   . Diabetes Mother   . Heart disease Father    Allergies  Allergen Reactions  . Accupril [Quinapril Hcl] Anaphylaxis    Angioedema  . Ace Inhibitors Anaphylaxis    Angioedema      Assessment & Plan:  The patient presents for his first postoperative follow-up.  The patient is status post  a left ulnar to ulnar AVF creation using radiofrequency with wavelinQ system and coil embolization of the left brachial vein and radiocephalic AV fistula on Sep 02, 2017.  The patient presents sooner than his originally scheduled postoperative follow-up at the request of his dialysis center.  The patient's dialysis center is unable to palpate a thrill during their examinations and would like further evaluation by our office.  The patient underwent a left upper extremity dialysis access duplex which was notable for patent left ulnar to ulnar aVF with a significant focal velocity increase in the perforator vein.  Diminished flow of less than 100 cm/s throughout the upper arm cephalic vein with flow appearing to course into the brachial vein.  Decreased outflow vein flow volume as compared to the one obtained in the inflow artery.  The patient is currently being maintained by a PermCath without issue.  The patient denies any uremic symptoms.  Patient denies any fever, nausea vomiting.  1. ESRD on dialysis ([place]) - Stable Patient presents for his first post operative follow-up Unable to adequately palpate a thrill Area of increased velocity in the perforator vein with diminished flow of less than 100 cm/s throughout the upper arm cephalic vein flow appearing to course into the brachial vein. With these findings on  duplex and physical examination the patient will most likely not be able to dialyze adequately through his fistula and recommend he undergo a left upper extremity arteriogram via the femoral approach as per Dr. Delana Meyer. Procedure, risks and benefits explained to the patient All questions answered The patient wishes to proceed The patient is to continue to dialyze through his PermCath at this time  2. Diabetes mellitus type 2 in obese ([facility]) - Stable Encouraged good control as its slows the progression of atherosclerotic disease  Current Outpatient Medications on File Prior to Visit  Medication Sig Dispense Refill  . allopurinol (ZYLOPRIM) 100 MG tablet Take 100 mg by mouth daily.    Marland Kitchen amLODipine (NORVASC) 10 MG tablet Take 10 mg by mouth daily.    Marland Kitchen aspirin EC 81 MG tablet Take 81 mg by mouth daily.    Marland Kitchen atorvastatin (LIPITOR) 20 MG tablet Take 20 mg by mouth every evening.     . Cholecalciferol 400 units CAPS Take 400 Units by mouth daily.     . cloNIDine (CATAPRES) 0.1 MG tablet Take 1 tablet (0.1 mg total) by mouth 3 (three) times daily. 90 tablet 0  . doxazosin (CARDURA) 8 MG tablet Take 8 mg by mouth at bedtime.    . fluticasone (FLONASE) 50 MCG/ACT nasal spray Place 1 spray into both nostrils daily as needed for allergies or rhinitis.    . furosemide (LASIX) 40 MG tablet Take 40 mg by mouth daily.     Marland Kitchen gabapentin (NEURONTIN) 100 MG capsule Take 1 capsule (100 mg total) by mouth 3 (three) times daily. 90 capsule 2  . glipiZIDE (GLUCOTROL XL) 2.5 MG 24 hr tablet Take 1 tablet (2.5 mg total) by mouth daily with breakfast. 30 tablet 2  . hydrALAZINE (APRESOLINE) 100 MG tablet Take 100 mg by mouth 3 (three) times daily.     Marland Kitchen HYDROcodone-acetaminophen (NORCO) 5-325 MG tablet Take 1-2 tablets by mouth every 6 (six) hours as needed for moderate pain or severe pain. 40 tablet 0  . tamsulosin (FLOMAX) 0.4 MG CAPS capsule Take 0.8 mg by mouth daily after breakfast.     . Testosterone (ANDROGEL)  20.25 MG/1.25GM (1.62%) GEL Place 4 Pump onto the  skin daily.    Marland Kitchen topiramate (TOPAMAX) 25 MG capsule Take 25 mg by mouth at bedtime.    . traZODone (DESYREL) 100 MG tablet Take 100 mg by mouth at bedtime.    . vitamin E 400 UNIT capsule Take 400 Units by mouth daily.     No current facility-administered medications on file prior to visit.    There are no Patient Instructions on file for this visit. No follow-ups on file.  Kolin Erdahl A Jema Deegan, PA-C

## 2017-10-11 DIAGNOSIS — Z992 Dependence on renal dialysis: Secondary | ICD-10-CM | POA: Diagnosis not present

## 2017-10-11 DIAGNOSIS — N186 End stage renal disease: Secondary | ICD-10-CM | POA: Diagnosis not present

## 2017-10-11 DIAGNOSIS — N2581 Secondary hyperparathyroidism of renal origin: Secondary | ICD-10-CM | POA: Diagnosis not present

## 2017-10-11 DIAGNOSIS — D509 Iron deficiency anemia, unspecified: Secondary | ICD-10-CM | POA: Diagnosis not present

## 2017-10-11 DIAGNOSIS — D631 Anemia in chronic kidney disease: Secondary | ICD-10-CM | POA: Diagnosis not present

## 2017-10-17 ENCOUNTER — Other Ambulatory Visit (INDEPENDENT_AMBULATORY_CARE_PROVIDER_SITE_OTHER): Payer: Self-pay | Admitting: Vascular Surgery

## 2017-10-19 ENCOUNTER — Ambulatory Visit
Admission: RE | Admit: 2017-10-19 | Discharge: 2017-10-19 | Disposition: A | Discharge status: Home / Self Care | Payer: PPO | Source: Ambulatory Visit | Attending: Vascular Surgery | Admitting: Vascular Surgery

## 2017-10-19 ENCOUNTER — Encounter
Admission: RE | Disposition: A | Discharge status: Home / Self Care | Payer: Self-pay | Source: Ambulatory Visit | Attending: Vascular Surgery

## 2017-10-19 ENCOUNTER — Encounter: Payer: Self-pay | Admitting: *Deleted

## 2017-10-19 DIAGNOSIS — Z992 Dependence on renal dialysis: Secondary | ICD-10-CM | POA: Insufficient documentation

## 2017-10-19 DIAGNOSIS — Z79899 Other long term (current) drug therapy: Secondary | ICD-10-CM | POA: Diagnosis not present

## 2017-10-19 DIAGNOSIS — Z9889 Other specified postprocedural states: Secondary | ICD-10-CM | POA: Diagnosis not present

## 2017-10-19 DIAGNOSIS — Z8249 Family history of ischemic heart disease and other diseases of the circulatory system: Secondary | ICD-10-CM | POA: Diagnosis not present

## 2017-10-19 DIAGNOSIS — G4730 Sleep apnea, unspecified: Secondary | ICD-10-CM | POA: Diagnosis not present

## 2017-10-19 DIAGNOSIS — Z6841 Body Mass Index (BMI) 40.0 and over, adult: Secondary | ICD-10-CM | POA: Insufficient documentation

## 2017-10-19 DIAGNOSIS — E669 Obesity, unspecified: Secondary | ICD-10-CM | POA: Diagnosis not present

## 2017-10-19 DIAGNOSIS — M1990 Unspecified osteoarthritis, unspecified site: Secondary | ICD-10-CM | POA: Diagnosis not present

## 2017-10-19 DIAGNOSIS — Z7984 Long term (current) use of oral hypoglycemic drugs: Secondary | ICD-10-CM | POA: Insufficient documentation

## 2017-10-19 DIAGNOSIS — I120 Hypertensive chronic kidney disease with stage 5 chronic kidney disease or end stage renal disease: Secondary | ICD-10-CM | POA: Insufficient documentation

## 2017-10-19 DIAGNOSIS — F17210 Nicotine dependence, cigarettes, uncomplicated: Secondary | ICD-10-CM | POA: Insufficient documentation

## 2017-10-19 DIAGNOSIS — Z888 Allergy status to other drugs, medicaments and biological substances status: Secondary | ICD-10-CM | POA: Insufficient documentation

## 2017-10-19 DIAGNOSIS — N186 End stage renal disease: Secondary | ICD-10-CM

## 2017-10-19 DIAGNOSIS — Z833 Family history of diabetes mellitus: Secondary | ICD-10-CM | POA: Diagnosis not present

## 2017-10-19 DIAGNOSIS — E785 Hyperlipidemia, unspecified: Secondary | ICD-10-CM | POA: Insufficient documentation

## 2017-10-19 DIAGNOSIS — Y832 Surgical operation with anastomosis, bypass or graft as the cause of abnormal reaction of the patient, or of later complication, without mention of misadventure at the time of the procedure: Secondary | ICD-10-CM | POA: Diagnosis not present

## 2017-10-19 DIAGNOSIS — G2581 Restless legs syndrome: Secondary | ICD-10-CM | POA: Diagnosis not present

## 2017-10-19 DIAGNOSIS — M109 Gout, unspecified: Secondary | ICD-10-CM | POA: Insufficient documentation

## 2017-10-19 DIAGNOSIS — Z7982 Long term (current) use of aspirin: Secondary | ICD-10-CM | POA: Diagnosis not present

## 2017-10-19 DIAGNOSIS — K219 Gastro-esophageal reflux disease without esophagitis: Secondary | ICD-10-CM | POA: Insufficient documentation

## 2017-10-19 DIAGNOSIS — J45909 Unspecified asthma, uncomplicated: Secondary | ICD-10-CM | POA: Diagnosis not present

## 2017-10-19 DIAGNOSIS — E1140 Type 2 diabetes mellitus with diabetic neuropathy, unspecified: Secondary | ICD-10-CM | POA: Insufficient documentation

## 2017-10-19 DIAGNOSIS — N400 Enlarged prostate without lower urinary tract symptoms: Secondary | ICD-10-CM | POA: Insufficient documentation

## 2017-10-19 DIAGNOSIS — I742 Embolism and thrombosis of arteries of the upper extremities: Secondary | ICD-10-CM | POA: Diagnosis not present

## 2017-10-19 DIAGNOSIS — T82590A Other mechanical complication of surgically created arteriovenous fistula, initial encounter: Secondary | ICD-10-CM | POA: Diagnosis not present

## 2017-10-19 HISTORY — PX: UPPER EXTREMITY ANGIOGRAPHY: CATH118270

## 2017-10-19 LAB — POTASSIUM (ARMC VASCULAR LAB ONLY): Potassium (ARMC vascular lab): 4 (ref 3.5–5.1)

## 2017-10-19 SURGERY — UPPER EXTREMITY ANGIOGRAPHY
Anesthesia: Moderate Sedation | Laterality: Left

## 2017-10-19 MED ORDER — FENTANYL CITRATE (PF) 100 MCG/2ML IJ SOLN
INTRAMUSCULAR | Status: AC
  Filled 2017-10-19: qty 2

## 2017-10-19 MED ORDER — FAMOTIDINE 20 MG PO TABS: 40.0000 mg | ORAL_TABLET | ORAL | Status: DC | PRN

## 2017-10-19 MED ORDER — ACETAMINOPHEN 325 MG PO TABS: 650.0000 mg | ORAL_TABLET | ORAL | Status: DC | PRN

## 2017-10-19 MED ORDER — OXYCODONE HCL 5 MG PO TABS: 5.0000 mg | ORAL_TABLET | ORAL | Status: DC | PRN

## 2017-10-19 MED ORDER — FENTANYL CITRATE (PF) 100 MCG/2ML IJ SOLN
INTRAMUSCULAR | Status: AC
  Filled 2017-10-19: qty 4

## 2017-10-19 MED ORDER — HYDRALAZINE HCL 20 MG/ML IJ SOLN: 5.0000 mg | INTRAMUSCULAR | Status: DC | PRN

## 2017-10-19 MED ORDER — ONDANSETRON HCL 4 MG/2ML IJ SOLN: 4.0000 mg | Freq: Four times a day (QID) | INTRAMUSCULAR | Status: DC | PRN

## 2017-10-19 MED ORDER — CEFAZOLIN SODIUM-DEXTROSE 1-4 GM/50ML-% IV SOLN
1.0000 g | Freq: Once | INTRAVENOUS | Status: AC
  Administered 2017-10-19: 1 g via INTRAVENOUS

## 2017-10-19 MED ORDER — LABETALOL HCL 5 MG/ML IV SOLN: 10.0000 mg | INTRAVENOUS | Status: DC | PRN

## 2017-10-19 MED ORDER — HEPARIN (PORCINE) IN NACL 1000-0.9 UT/500ML-% IV SOLN
INTRAVENOUS | Status: AC
  Filled 2017-10-19: qty 1000

## 2017-10-19 MED ORDER — HEPARIN SODIUM (PORCINE) 1000 UNIT/ML IJ SOLN
INTRAMUSCULAR | Status: DC | PRN
  Administered 2017-10-19: 4000 [IU] via INTRAVENOUS

## 2017-10-19 MED ORDER — MIDAZOLAM HCL 2 MG/2ML IJ SOLN
INTRAMUSCULAR | Status: DC | PRN
  Administered 2017-10-19: 2 mg via INTRAVENOUS
  Administered 2017-10-19 (×3): 1 mg via INTRAVENOUS

## 2017-10-19 MED ORDER — LIDOCAINE HCL (PF) 1 % IJ SOLN
INTRAMUSCULAR | Status: AC
  Filled 2017-10-19: qty 30

## 2017-10-19 MED ORDER — SODIUM CHLORIDE 0.9% FLUSH: 3.0000 mL | INTRAVENOUS | Status: DC | PRN

## 2017-10-19 MED ORDER — MIDAZOLAM HCL 2 MG/2ML IJ SOLN
INTRAMUSCULAR | Status: AC
  Filled 2017-10-19: qty 2

## 2017-10-19 MED ORDER — MORPHINE SULFATE (PF) 4 MG/ML IV SOLN: 2.0000 mg | INTRAVENOUS | Status: DC | PRN

## 2017-10-19 MED ORDER — SODIUM CHLORIDE 0.9 % IV SOLN: 250.0000 mL | INTRAVENOUS | Status: DC | PRN

## 2017-10-19 MED ORDER — IOPAMIDOL (ISOVUE-300) INJECTION 61%
INTRAVENOUS | Status: DC | PRN
  Administered 2017-10-19: 50 mL via INTRA_ARTERIAL

## 2017-10-19 MED ORDER — SODIUM CHLORIDE 0.9% FLUSH: 3.0000 mL | Freq: Two times a day (BID) | INTRAVENOUS | Status: DC

## 2017-10-19 MED ORDER — CEFAZOLIN SODIUM-DEXTROSE 1-4 GM/50ML-% IV SOLN
INTRAVENOUS | Status: AC
  Filled 2017-10-19: qty 50

## 2017-10-19 MED ORDER — METHYLPREDNISOLONE SODIUM SUCC 125 MG IJ SOLR: 125.0000 mg | INTRAMUSCULAR | Status: DC | PRN

## 2017-10-19 MED ORDER — SODIUM CHLORIDE 0.9 % IV SOLN
INTRAVENOUS | Status: DC
  Administered 2017-10-19: 10:00:00 via INTRAVENOUS

## 2017-10-19 MED ORDER — HYDROMORPHONE HCL 1 MG/ML IJ SOLN: 1.0000 mg | Freq: Once | INTRAMUSCULAR | Status: DC | PRN

## 2017-10-19 MED ORDER — MIDAZOLAM HCL 5 MG/5ML IJ SOLN
INTRAMUSCULAR | Status: AC
  Filled 2017-10-19: qty 5

## 2017-10-19 MED ORDER — HEPARIN SODIUM (PORCINE) 1000 UNIT/ML IJ SOLN
INTRAMUSCULAR | Status: AC
Start: 2017-10-19 — End: ?
  Filled 2017-10-19: qty 1

## 2017-10-19 MED ORDER — FENTANYL CITRATE (PF) 100 MCG/2ML IJ SOLN
INTRAMUSCULAR | Status: DC | PRN
  Administered 2017-10-19: 25 ug via INTRAVENOUS
  Administered 2017-10-19: 50 ug via INTRAVENOUS
  Administered 2017-10-19: 100 ug via INTRAVENOUS
  Administered 2017-10-19: 50 ug via INTRAVENOUS

## 2017-10-19 SURGICAL SUPPLY — 22 items
BALLN LUTONIX DCB 4X40X130 (BALLOONS) ×3
BALLN ULTRVRSE 4X40X150 (BALLOONS) ×3
BALLOON LUTONIX DCB 4X40X130 (BALLOONS) ×1 IMPLANT
BALLOON ULTRVRSE 4X40X150 (BALLOONS) ×1 IMPLANT
CATH ANGIO 5F 100CM .035 PIG (CATHETERS) ×3 IMPLANT
CATH BEACON 5 .035 100 H1 TIP (CATHETERS) ×3 IMPLANT
CATH CXI SUPP ANG 2.6FR 150CM (CATHETERS) ×3 IMPLANT
CATH GWIRE MARINER STRGHT 4FR (CATHETERS) ×3 IMPLANT
DEVICE PRESTO INFLATION (MISCELLANEOUS) ×3 IMPLANT
DEVICE STARCLOSE SE CLOSURE (Vascular Products) ×3 IMPLANT
DEVICE TORQUE .025-.038 (MISCELLANEOUS) ×3 IMPLANT
NEEDLE ENTRY 21GA 7CM ECHOTIP (NEEDLE) ×3 IMPLANT
PACK ANGIOGRAPHY (CUSTOM PROCEDURE TRAY) ×3 IMPLANT
SET INTRO CAPELLA COAXIAL (SET/KITS/TRAYS/PACK) ×3 IMPLANT
SHEATH BRITE TIP 5FRX11 (SHEATH) ×3 IMPLANT
SHEATH SHUTTLE 6FR (SHEATH) ×3 IMPLANT
TUBING CONTRAST HIGH PRESS 72 (TUBING) ×3 IMPLANT
VALVE CHECKFLO PERFORMER (SHEATH) ×3 IMPLANT
WIRE AQUATRACK .035X260CM (WIRE) ×3 IMPLANT
WIRE BAREWIRE WORK .014X315CM (WIRE) ×3 IMPLANT
WIRE G.018X260 SHT (WIRE) ×3 IMPLANT
WIRE J 3MM .035X145CM (WIRE) ×3 IMPLANT

## 2017-10-19 NOTE — Op Note (Signed)
[place] [facility]  Percutaneous Study/Intervention Procedural Note   Date of Surgery: 10/19/2017,1:32 PM  Surgeon:Schnier, Dolores Lory   Pre-operative Diagnosis: Complication of dialysis access with failure of fistula to mature; end-stage renal disease requiring hemodialysis  Post-operative diagnosis:  Same  Procedure(s) Performed:  1.  Arch aortogram  2.  Left upper extremity arteriography third order catheter placement  3.  Percutaneous transluminal angioplasty of the ulnar artery and AV anastomosis to 4 mm  4.  Star close right common femoral artery    Anesthesia: Conscious sedation was administered by the interventional radiology RN under my direct supervision. IV Versed plus fentanyl were utilized. Continuous ECG, pulse oximetry and blood pressure was monitored throughout the entire procedure. Conscious sedation was administered for a total of 67 minutes.  Sheath: 6 [place] shuttle sheath right common femoral artery  Contrast: 50 cc   Fluoroscopy Time: 16.6 minutes  Indications: Patient is status post creation of an endovascular fistula with a wave link system.  Fistula has not matured.  There is concerns on duplex ultrasound at the arterial inflow is not adequate and is therefore undergoing arteriogram with hope for intervention and correction of this problem.  Risks and benefits of been reviewed all questions answered patient agrees to proceed.  Procedure:  Donald Fernandez a 47 y.o. male who was identified and appropriate procedural time out was performed.  The patient was then placed supine on the table and prepped and draped in the usual sterile fashion.  Ultrasound was used to evaluate the right common femoral artery.  It was echolucent and pulsatile indicating it is patent .  An ultrasound image was acquired for the permanent record.  A micropuncture needle was used to access the right common femoral artery under direct ultrasound guidance.  The microwire was  then advanced under fluoroscopic guidance without difficulty followed by the micro-sheath.  A 0.035 J wire was advanced without resistance and a 5Fr sheath was placed.    Pigtail catheter was then advanced into the a sending aorta and an LAO projection of the aortic arch was obtained.  Aqua track wire was then introduced and the pigtail catheter exchanged for an H1 catheter.  H1 catheter was then used to select the left subclavian and the wire and catheter were advanced out to the distal left subclavian.  Contrast injection was then used to perform imaging of the axillary brachial arteries as well as the forearm arteries.  Aqua track wire was reintroduced and the wire was negotiated down to the level of the antecubital fossa and the 5 [place] Pinnacle sheath was exchanged for a 90 cm 6 [place] shuttle sheath.  150 catheter was then advanced over the wire.  Using the catheter and wire the anastomosis was selected and the wire was advanced into the deep venous system.  After several manipulations I was able to get a 4 mm x 40 mm Ultraverse balloon across the anastomosis and angioplasty was performed x2 each time the balloon was inflated to 12 atm for approximately 30 to 60 seconds.  Follow-up imaging demonstrated the arterial anastomosis was now twice as big as it was before with a significant improvement in flow.  Given this improvement I elected to terminate the procedure.  Catheter was removed.  The sheath was pulled back into the right external iliac oblique view was obtained in Star close device deployed.  Findings:   Aortogram: Aortic arch demonstrates type I anatomy with wide patency of all the great vessels.  Left upper Extremity: The left subclavian axillary and brachial arteries are widely patent.  The trifurcation remains patent and the ulnar interosseous and radial arteries are patent.  On steep caudal imaging which was determined after imaging the origin of the AV fistula in multiple views to be  the optimal view the anastomosis is identified and this appears to be only 2 mm in diameter.  There is filling of the venous perforator directly into the cephalic vein and the cephalic vein actually measures 10 to 11 mm in diameter as does the perforator.  There is also retrograde filling into the deep venous system.  Overall the fistula appears to be quite good with the exception that the arterial anastomosis has not enlarged to a size appropriate for the flow needed.  I therefore elected to angioplasty this area.  4 mm balloon was used to inflations were performed after which the actual anastomosis between the vein and the artery now measures 4 mm in diameter.  Visually there is a significant improvement in volume flow.  Summary successful intervention to improve volume flow and assist in maturation of the left arm AV fistula.  Addendum given the patient's size it may be required to elevate the cephalic vein or superficial lysed the cephalic vein so that the fistula can be readily accessed.  This will be discussed with him in follow-up.    Disposition: Patient was taken to the recovery room in stable condition having tolerated the procedure well.  Belenda Cruise Schnier 10/19/2017,1:32 PM

## 2017-10-19 NOTE — H&P (Signed)
 [facility] History & Physical Update  The patient was interviewed and re-examined.  The patient's previous History and Physical has been reviewed and is unchanged.  There is no change in the plan of care. We plan to proceed with the scheduled procedure.  Hortencia Pilar, MD  10/19/2017, 12:01 PM

## 2017-10-20 ENCOUNTER — Encounter: Payer: Self-pay | Admitting: Vascular Surgery

## 2017-10-21 ENCOUNTER — Encounter: Payer: Self-pay | Admitting: Vascular Surgery

## 2017-10-31 ENCOUNTER — Ambulatory Visit (INDEPENDENT_AMBULATORY_CARE_PROVIDER_SITE_OTHER): Payer: PPO | Admitting: Vascular Surgery

## 2017-10-31 ENCOUNTER — Encounter (INDEPENDENT_AMBULATORY_CARE_PROVIDER_SITE_OTHER): Payer: Self-pay | Admitting: Vascular Surgery

## 2017-10-31 DIAGNOSIS — I10 Essential (primary) hypertension: Secondary | ICD-10-CM

## 2017-10-31 DIAGNOSIS — T829XXA Unspecified complication of cardiac and vascular prosthetic device, implant and graft, initial encounter: Secondary | ICD-10-CM | POA: Insufficient documentation

## 2017-10-31 DIAGNOSIS — N186 End stage renal disease: Secondary | ICD-10-CM

## 2017-10-31 DIAGNOSIS — E669 Obesity, unspecified: Secondary | ICD-10-CM

## 2017-10-31 DIAGNOSIS — T829XXS Unspecified complication of cardiac and vascular prosthetic device, implant and graft, sequela: Secondary | ICD-10-CM

## 2017-10-31 DIAGNOSIS — Z992 Dependence on renal dialysis: Secondary | ICD-10-CM

## 2017-10-31 DIAGNOSIS — E1169 Type 2 diabetes mellitus with other specified complication: Secondary | ICD-10-CM

## 2017-10-31 NOTE — Progress Notes (Signed)
MRN : [id number]  Donald Fernandez is a 47 y.o. (1971/01/16) male who presents with chief complaint of No chief complaint on file. Marland Kitchen  History of Present Illness:   The patient returns to the office for followup status post intervention of the dialysis access 10/19/2017.  Procedure(s) Performed:             1.  Arch aortogram             2.  Left upper extremity arteriography third order catheter placement             3.  Percutaneous transluminal angioplasty of the ulnar artery and AV anastomosis to 4 mm             4.  Star close right common femoral artery   The endovascular fistula was created on 09/02/2017.  Following the intervention the excess function improved but it continues to be difficult to feel the fistula secondary to the depth and the size of his arm.   The patient denies an increase in arm swelling. At the present time the patient denies hand pain.  The patient denies amaurosis fugax or recent TIA symptoms. There are no recent neurological changes noted. The patient denies claudication symptoms or rest pain symptoms. The patient denies history of DVT, PE or superficial thrombophlebitis. The patient denies recent episodes of angina or shortness of breath.       No outpatient medications have been marked as taking for the 10/31/17 encounter (Appointment) with Delana Meyer, Dolores Lory, MD.    Past Medical History:  Diagnosis Date  . Arthritis   . Asthma    triggered by enviornmental allergies  . BPH (benign prostatic hyperplasia)   . Diabetes mellitus without complication ([place])   . Dialysis patient [facility])    dialysis T-TH-SAT  . GERD (gastroesophageal reflux disease)   . Gout   . Hyperlipidemia   . Hypertension   . Neuropathy   . Renal disorder    CKD4; end stage renal disease  . Restless legs   . Sleep apnea    uses cpap    Past Surgical History:  Procedure Laterality Date  . A/V FISTULAGRAM N/A 06/08/2017   Procedure: A/V FISTULAGRAM;  Surgeon: Algernon Huxley, MD;  Location: [place] [facility];  Service: Cardiovascular;  Laterality: N/A;  . ANKLE SURGERY Right    fracture repair with pins, plates, screws  . AV FISTULA INSERTION W/ RF MAGNETIC GUIDANCE Left 09/02/2017   Procedure: AV FISTULA INSERTION W/RF MAGNETIC GUIDANCE;  Surgeon: Katha Cabal, MD;  Location: [place] [facility];  Service: Cardiovascular;  Laterality: Left;  . AV FISTULA PLACEMENT Left 08/04/2016   Procedure: ARTERIOVENOUS (AV) FISTULA CREATION ( RADIOCEPHALIC );  Surgeon: Katha Cabal, MD;  Location: [facility];  Service: Vascular;  Laterality: Left;  . DIALYSIS/PERMA CATHETER INSERTION N/A 06/06/2017   Procedure: DIALYSIS/PERMA CATHETER INSERTION;  Surgeon: Algernon Huxley, MD;  Location: [place] [facility];  Service: Cardiovascular;  Laterality: N/A;  . IR ANGIO INTRA EXTRACRAN SEL INTERNAL CAROTID UNI R MOD SED  12/17/2016  . RADIOLOGY WITH ANESTHESIA N/A 12/17/2016   Procedure: Arteriogram/Venogram with right venous sinus manometry;  Surgeon: Consuella Lose, MD;  Location: [place];  Service: Radiology;  Laterality: N/A;  . UPPER EXTREMITY ANGIOGRAPHY Left 10/19/2017   Procedure: UPPER EXTREMITY ANGIOGRAPHY;  Surgeon: Katha Cabal, MD;  Location: [place] [facility];  Service: Cardiovascular;  Laterality: Left;    Social History  Social History   Tobacco Use  . Smoking status: Current Every Day Smoker    Types: Cigarettes  . Smokeless tobacco: Never Used  . Tobacco comment: APPROX. 1 PK/WEEK   Substance Use Topics  . Alcohol use: Yes    Comment: weekends  . Drug use: Yes    Types: Marijuana    Comment: LAST TIME APPROX. 08/03/17    Family History Family History  Problem Relation Age of Onset  . AAA (abdominal aortic aneurysm) Mother   . Diabetes Mother   . Heart disease Father     Allergies  Allergen Reactions  . Accupril [Quinapril Hcl] Anaphylaxis    Angioedema  . Ace Inhibitors Anaphylaxis    Angioedema     REVIEW OF SYSTEMS (Negative  unless checked)  Constitutional: [] Weight loss  [] Fever  [] Chills Cardiac: [] Chest pain   [] Chest pressure   [] Palpitations   [] Shortness of breath when laying flat   [] Shortness of breath with exertion. Vascular:  [] Pain in legs with walking   [] Pain in legs at rest  [] History of DVT   [] Phlebitis   [] Swelling in legs   [] Varicose veins   [] Non-healing ulcers Pulmonary:   [] Uses home oxygen   [] Productive cough   [] Hemoptysis   [] Wheeze  [] COPD   [] Asthma Neurologic:  [] Dizziness   [] Seizures   [] History of stroke   [] History of TIA  [] Aphasia   [] Vissual changes   [] Weakness or numbness in arm   [] Weakness or numbness in leg Musculoskeletal:   [] Joint swelling   [] Joint pain   [] Low back pain Hematologic:  [] Easy bruising  [] Easy bleeding   [] Hypercoagulable state   [] Anemic Gastrointestinal:  [] Diarrhea   [] Vomiting  [] Gastroesophageal reflux/heartburn   [] Difficulty swallowing. Genitourinary:  [x] Chronic kidney disease   [] Difficult urination  [] Frequent urination   [] Blood in urine Skin:  [] Rashes   [] Ulcers  Psychological:  [] History of anxiety   []  History of major depression.  Physical Examination  There were no vitals filed for this visit. There is no height or weight on file to calculate BMI. Gen: WD/WN, NAD Head: [place]/AT, No temporalis wasting.  Ear/Nose/Throat: Hearing grossly intact, nares w/o erythema or drainage Eyes: PER, EOMI, sclera nonicteric.  Neck: Supple, no large masses.   Pulmonary:  Good air movement, no audible wheezing bilaterally, no use of accessory muscles.  Cardiac: RRR, no JVD Vascular: left arm cephalic fistula with weak thrill andgood bruit Vessel Right Left  Radial Palpable Palpable  Ulnar Palpable Palpable  Brachial Palpable Palpable  Gastrointestinal: Non-distended. No guarding/no peritoneal signs.  Musculoskeletal: M/S 5/5 throughout.  No deformity or atrophy.  Neurologic: CN 2-12 intact. Symmetrical.  Speech is fluent. Motor exam as listed  above. Psychiatric: Judgment intact, Mood & affect appropriate for pt's clinical situation. Dermatologic: No rashes or ulcers noted.  No changes consistent with cellulitis. Lymph : No lichenification or skin changes of chronic lymphedema.  CBC Lab Results  Component Value Date   WBC 8.6 09/02/2017   HGB 12.3 (L) 09/02/2017   HCT 35.9 (L) 09/02/2017   MCV 92.0 09/02/2017   PLT 224 09/02/2017    BMET    Component Value Date/Time   NA 124 (L) 09/02/2017 1114   NA 145 12/31/2011 1850   K 3.8 09/02/2017 1114   K 3.5 12/31/2011 1850   CL 90 (L) 09/02/2017 1114   CL 108 (H) 12/31/2011 1850   CO2 19 (L) 09/02/2017 1114   CO2 28 12/31/2011 1850   GLUCOSE 84  09/02/2017 1114   GLUCOSE 63 (L) 12/31/2011 1850   BUN 58 (H) 09/02/2017 1114   BUN 14 12/31/2011 1850   CREATININE 14.49 (H) 09/02/2017 1114   CREATININE 1.72 (H) 12/31/2011 1850   CALCIUM 7.8 (L) 09/02/2017 1114   CALCIUM 8.1 (L) 06/06/2017 2038   GFRNONAA 3 (L) 09/02/2017 1114   GFRNONAA 48 (L) 12/31/2011 1850   GFRAA 4 (L) 09/02/2017 1114   GFRAA 55 (L) 12/31/2011 1850   CrCl cannot be calculated (Patient's most recent lab result is older than the maximum 21 days allowed.).  COAG Lab Results  Component Value Date   INR 0.90 09/02/2017   INR 0.91 08/08/2017   INR 0.97 12/17/2016    Radiology No results found.    Assessment/Plan 1. Complication of vascular access for dialysis, sequela I have discussed elevation of the fistula with the patient but he states he can not afford the copay  Continue to follow for now  2. ESRD on dialysis ([place]) Continue HD via catheter  3. Diabetes mellitus type 2 in obese [facility]) Continue hypoglycemic medications as already ordered, these medications have been reviewed and there are no changes at this time.  Hgb A1C to be monitored as already arranged by primary service   4. Essential hypertension Continue antihypertensive medications as already ordered, these medications  have been reviewed and there are no changes at this time.    Hortencia Pilar, MD  10/31/2017 3:12 PM

## 2017-11-01 DIAGNOSIS — Z992 Dependence on renal dialysis: Secondary | ICD-10-CM | POA: Diagnosis not present

## 2017-11-01 DIAGNOSIS — E119 Type 2 diabetes mellitus without complications: Secondary | ICD-10-CM | POA: Diagnosis not present

## 2017-11-02 ENCOUNTER — Encounter (INDEPENDENT_AMBULATORY_CARE_PROVIDER_SITE_OTHER): Payer: Self-pay | Admitting: Vascular Surgery

## 2017-11-09 DIAGNOSIS — N186 End stage renal disease: Secondary | ICD-10-CM | POA: Diagnosis not present

## 2017-11-09 DIAGNOSIS — Z992 Dependence on renal dialysis: Secondary | ICD-10-CM | POA: Diagnosis not present

## 2017-11-10 DIAGNOSIS — D631 Anemia in chronic kidney disease: Secondary | ICD-10-CM | POA: Diagnosis not present

## 2017-11-10 DIAGNOSIS — D509 Iron deficiency anemia, unspecified: Secondary | ICD-10-CM | POA: Diagnosis not present

## 2017-11-10 DIAGNOSIS — N2581 Secondary hyperparathyroidism of renal origin: Secondary | ICD-10-CM | POA: Diagnosis not present

## 2017-11-10 DIAGNOSIS — Z992 Dependence on renal dialysis: Secondary | ICD-10-CM | POA: Diagnosis not present

## 2017-11-10 DIAGNOSIS — N186 End stage renal disease: Secondary | ICD-10-CM | POA: Diagnosis not present

## 2017-12-06 DIAGNOSIS — Z992 Dependence on renal dialysis: Secondary | ICD-10-CM | POA: Diagnosis not present

## 2017-12-08 ENCOUNTER — Encounter (INDEPENDENT_AMBULATORY_CARE_PROVIDER_SITE_OTHER): Payer: Self-pay

## 2017-12-09 ENCOUNTER — Other Ambulatory Visit (INDEPENDENT_AMBULATORY_CARE_PROVIDER_SITE_OTHER): Payer: Self-pay | Admitting: Nurse Practitioner

## 2017-12-10 DIAGNOSIS — Z992 Dependence on renal dialysis: Secondary | ICD-10-CM | POA: Diagnosis not present

## 2017-12-10 DIAGNOSIS — N186 End stage renal disease: Secondary | ICD-10-CM | POA: Diagnosis not present

## 2017-12-13 DIAGNOSIS — N186 End stage renal disease: Secondary | ICD-10-CM | POA: Diagnosis not present

## 2017-12-13 DIAGNOSIS — Z23 Encounter for immunization: Secondary | ICD-10-CM | POA: Diagnosis not present

## 2017-12-13 DIAGNOSIS — N2581 Secondary hyperparathyroidism of renal origin: Secondary | ICD-10-CM | POA: Diagnosis not present

## 2017-12-13 DIAGNOSIS — Z992 Dependence on renal dialysis: Secondary | ICD-10-CM | POA: Diagnosis not present

## 2017-12-13 DIAGNOSIS — D631 Anemia in chronic kidney disease: Secondary | ICD-10-CM | POA: Diagnosis not present

## 2017-12-13 DIAGNOSIS — D509 Iron deficiency anemia, unspecified: Secondary | ICD-10-CM | POA: Diagnosis not present

## 2017-12-14 ENCOUNTER — Other Ambulatory Visit: Payer: Self-pay

## 2017-12-14 ENCOUNTER — Encounter
Admission: RE | Admit: 2017-12-14 | Discharge: 2017-12-14 | Disposition: A | Discharge status: Home / Self Care | Payer: PPO | Source: Ambulatory Visit | Attending: Vascular Surgery | Admitting: Vascular Surgery

## 2017-12-14 DIAGNOSIS — R9431 Abnormal electrocardiogram [ECG] [EKG]: Secondary | ICD-10-CM | POA: Diagnosis not present

## 2017-12-14 DIAGNOSIS — Z01810 Encounter for preprocedural cardiovascular examination: Secondary | ICD-10-CM | POA: Insufficient documentation

## 2017-12-14 DIAGNOSIS — Z01812 Encounter for preprocedural laboratory examination: Secondary | ICD-10-CM | POA: Insufficient documentation

## 2017-12-14 LAB — BASIC METABOLIC PANEL
Anion gap: 13 (ref 5–15)
BUN: 52 mg/dL — ABNORMAL HIGH (ref 6–20)
CALCIUM: 8.4 mg/dL — AB (ref 8.9–10.3)
CO2: 22 mmol/L (ref 22–32)
Chloride: 100 mmol/L (ref 98–111)
Creatinine, Ser: 12.01 mg/dL — ABNORMAL HIGH (ref 0.61–1.24)
GFR calc Af Amer: 5 mL/min — ABNORMAL LOW (ref >60)
GFR, EST NON AFRICAN AMERICAN: 4 mL/min — AB (ref >60)
GLUCOSE: 88 mg/dL (ref 70–99)
Potassium: 3.9 mmol/L (ref 3.5–5.1)
Sodium: 135 mmol/L (ref 135–145)

## 2017-12-14 LAB — CBC WITH DIFFERENTIAL/PLATELET
BASOS ABS: 0.1 10*3/uL (ref 0–0.1)
BASOS PCT: 1 %
Eosinophils Absolute: 0.2 10*3/uL (ref 0–0.7)
Eosinophils Relative: 2 %
HEMATOCRIT: 36.7 % — AB (ref 40.0–52.0)
HEMOGLOBIN: 12.7 g/dL — AB (ref 13.0–18.0)
LYMPHS PCT: 15 %
Lymphs Abs: 1.2 10*3/uL (ref 1.0–3.6)
MCH: 32.4 pg (ref 26.0–34.0)
MCHC: 34.6 g/dL (ref 32.0–36.0)
MCV: 93.5 fL (ref 80.0–100.0)
MONOS PCT: 8 %
Monocytes Absolute: 0.7 10*3/uL (ref 0.2–1.0)
NEUTROS PCT: 74 %
Neutro Abs: 5.8 10*3/uL (ref 1.4–6.5)
Platelets: 247 10*3/uL (ref 150–440)
RBC: 3.92 MIL/uL — ABNORMAL LOW (ref 4.40–5.90)
RDW: 15.7 % — ABNORMAL HIGH (ref 11.5–14.5)
WBC: 7.9 10*3/uL (ref 3.8–10.6)

## 2017-12-14 LAB — PROTIME-INR
INR: 0.88
Prothrombin Time: 11.9 seconds (ref 11.4–15.2)

## 2017-12-14 LAB — APTT: APTT: 29 s (ref 24–36)

## 2017-12-14 NOTE — Patient Instructions (Signed)
Your procedure is scheduled on: Friday, December 23, 2017 Report to Day Surgery on the 2nd floor of the [facility]. To find out your arrival time, please call [phone] between 1PM - 3PM on: Thursday, December 22, 2017  REMEMBER: Instructions that are not followed completely may result in serious medical risk, up to and including death; or upon the discretion of your surgeon and anesthesiologist your surgery may need to be rescheduled.  Do not eat food after midnight the night before surgery.  No gum chewing, lozengers or hard candies.  You may however, drink water up to 2 hours before you are scheduled to arrive for your surgery. Do not drink anything within 2 hours of the start of your surgery.  No Alcohol for 24 hours before or after surgery.  No Smoking including e-cigarettes for 24 hours prior to surgery.  No chewable tobacco products for at least 6 hours prior to surgery.  No nicotine patches on the day of surgery.  On the morning of surgery brush your teeth with toothpaste and water, you may rinse your mouth with mouthwash if you wish. Do not swallow any toothpaste or mouthwash.  Notify your doctor if there is any change in your medical condition (cold, fever, infection).  Do not wear jewelry, make-up, hairpins, clips or nail polish.  Do not wear lotions, powders, or perfumes. You may wear deodorant.  Do not shave 48 hours prior to surgery. Men may shave face and neck.  Contacts and dentures may not be worn into surgery.  Do not bring valuables to the hospital, including drivers license, insurance or credit cards.   is not responsible for any belongings or valuables.   TAKE THESE MEDICATIONS THE MORNING OF SURGERY:  1.  Amlodipine 2.  Clonidine 3.  Gabapentin 4.  Hydralazine 5.  Tamsulosin  Use CHG Soap as directed on instruction sheet.  Bring your C-PAP to the hospital with you in case you may have to spend the night.   NOW!  Stop  Anti-inflammatories (NSAIDS) such as Advil, Aleve, Ibuprofen, Motrin, Naproxen, Naprosyn and Aspirin based products such as Excedrin, Goodys Powder, BC Powder. (May take Tylenol or Acetaminophen if needed.)  NOW!  Stop ANY OVER THE COUNTER supplements until after surgery. (vitamin E) (May continue Vitamin D, Vitamin B, and multivitamin.)  Wear comfortable clothing (specific to your surgery type) to the hospital.  If you are being discharged the day of surgery, you will not be allowed to drive home. You will need a responsible adult to drive you home and stay with you that night.   If you are taking public transportation, you will need to have a responsible adult with you. Please confirm with your physician that it is acceptable to use public transportation.   Please call [phone] if you have any questions about these instructions.

## 2017-12-16 ENCOUNTER — Encounter (INDEPENDENT_AMBULATORY_CARE_PROVIDER_SITE_OTHER): Payer: Self-pay

## 2018-01-03 DIAGNOSIS — Z992 Dependence on renal dialysis: Secondary | ICD-10-CM | POA: Diagnosis not present

## 2018-01-03 MED ORDER — CLINDAMYCIN PHOSPHATE 900 MG/50ML IV SOLN
900.0000 mg | INTRAVENOUS | Status: AC
  Administered 2018-01-04: 900 mg via INTRAVENOUS

## 2018-01-04 ENCOUNTER — Other Ambulatory Visit: Payer: Self-pay

## 2018-01-04 ENCOUNTER — Ambulatory Visit
Admission: RE | Admit: 2018-01-04 | Discharge: 2018-01-04 | Disposition: A | Discharge status: Home / Self Care | Payer: PPO | Source: Ambulatory Visit | Attending: Vascular Surgery | Admitting: Vascular Surgery

## 2018-01-04 ENCOUNTER — Encounter
Admission: RE | Disposition: A | Discharge status: Home / Self Care | Payer: Self-pay | Source: Ambulatory Visit | Attending: Vascular Surgery

## 2018-01-04 ENCOUNTER — Ambulatory Visit: Payer: PPO | Admitting: Anesthesiology

## 2018-01-04 DIAGNOSIS — N400 Enlarged prostate without lower urinary tract symptoms: Secondary | ICD-10-CM | POA: Insufficient documentation

## 2018-01-04 DIAGNOSIS — Y832 Surgical operation with anastomosis, bypass or graft as the cause of abnormal reaction of the patient, or of later complication, without mention of misadventure at the time of the procedure: Secondary | ICD-10-CM | POA: Diagnosis not present

## 2018-01-04 DIAGNOSIS — F17210 Nicotine dependence, cigarettes, uncomplicated: Secondary | ICD-10-CM | POA: Diagnosis not present

## 2018-01-04 DIAGNOSIS — E1140 Type 2 diabetes mellitus with diabetic neuropathy, unspecified: Secondary | ICD-10-CM | POA: Diagnosis not present

## 2018-01-04 DIAGNOSIS — Z7982 Long term (current) use of aspirin: Secondary | ICD-10-CM | POA: Diagnosis not present

## 2018-01-04 DIAGNOSIS — E785 Hyperlipidemia, unspecified: Secondary | ICD-10-CM | POA: Insufficient documentation

## 2018-01-04 DIAGNOSIS — Z79899 Other long term (current) drug therapy: Secondary | ICD-10-CM | POA: Insufficient documentation

## 2018-01-04 DIAGNOSIS — G4730 Sleep apnea, unspecified: Secondary | ICD-10-CM | POA: Insufficient documentation

## 2018-01-04 DIAGNOSIS — I120 Hypertensive chronic kidney disease with stage 5 chronic kidney disease or end stage renal disease: Secondary | ICD-10-CM | POA: Insufficient documentation

## 2018-01-04 DIAGNOSIS — Z992 Dependence on renal dialysis: Secondary | ICD-10-CM | POA: Diagnosis not present

## 2018-01-04 DIAGNOSIS — G2581 Restless legs syndrome: Secondary | ICD-10-CM | POA: Diagnosis not present

## 2018-01-04 DIAGNOSIS — Z7984 Long term (current) use of oral hypoglycemic drugs: Secondary | ICD-10-CM | POA: Diagnosis not present

## 2018-01-04 DIAGNOSIS — Z79890 Hormone replacement therapy (postmenopausal): Secondary | ICD-10-CM | POA: Insufficient documentation

## 2018-01-04 DIAGNOSIS — N186 End stage renal disease: Secondary | ICD-10-CM | POA: Diagnosis not present

## 2018-01-04 DIAGNOSIS — M1990 Unspecified osteoarthritis, unspecified site: Secondary | ICD-10-CM | POA: Insufficient documentation

## 2018-01-04 DIAGNOSIS — E1122 Type 2 diabetes mellitus with diabetic chronic kidney disease: Secondary | ICD-10-CM | POA: Diagnosis not present

## 2018-01-04 DIAGNOSIS — J45909 Unspecified asthma, uncomplicated: Secondary | ICD-10-CM | POA: Diagnosis not present

## 2018-01-04 DIAGNOSIS — M109 Gout, unspecified: Secondary | ICD-10-CM | POA: Diagnosis not present

## 2018-01-04 DIAGNOSIS — T82898A Other specified complication of vascular prosthetic devices, implants and grafts, initial encounter: Secondary | ICD-10-CM | POA: Insufficient documentation

## 2018-01-04 DIAGNOSIS — K219 Gastro-esophageal reflux disease without esophagitis: Secondary | ICD-10-CM | POA: Insufficient documentation

## 2018-01-04 DIAGNOSIS — T82510A Breakdown (mechanical) of surgically created arteriovenous fistula, initial encounter: Secondary | ICD-10-CM | POA: Diagnosis not present

## 2018-01-04 HISTORY — PX: REVISON OF ARTERIOVENOUS FISTULA: SHX6074

## 2018-01-04 LAB — URINE DRUG SCREEN, QUALITATIVE (ARMC ONLY)
AMPHETAMINES, UR SCREEN: NOT DETECTED
BENZODIAZEPINE, UR SCRN: NOT DETECTED
Barbiturates, Ur Screen: NOT DETECTED
COCAINE METABOLITE, UR ~~LOC~~: NOT DETECTED
Cannabinoid 50 Ng, Ur ~~LOC~~: NOT DETECTED
MDMA (ECSTASY) UR SCREEN: NOT DETECTED
METHADONE SCREEN, URINE: NOT DETECTED
OPIATE, UR SCREEN: NOT DETECTED
PHENCYCLIDINE (PCP) UR S: NOT DETECTED
Tricyclic, Ur Screen: NOT DETECTED

## 2018-01-04 LAB — POCT I-STAT 4, (NA,K, GLUC, HGB,HCT)
Glucose, Bld: 97 mg/dL (ref 70–99)
HCT: 37 % — ABNORMAL LOW (ref 39.0–52.0)
Hemoglobin: 12.6 g/dL — ABNORMAL LOW (ref 13.0–17.0)
Potassium: 3.5 mmol/L (ref 3.5–5.1)
Sodium: 140 mmol/L (ref 135–145)

## 2018-01-04 LAB — TYPE AND SCREEN
ABO/RH(D): A POS
ANTIBODY SCREEN: NEGATIVE

## 2018-01-04 LAB — GLUCOSE, CAPILLARY
Glucose-Capillary: 89 mg/dL (ref 70–99)
Glucose-Capillary: 87 mg/dL (ref 70–99)

## 2018-01-04 SURGERY — REVISON OF ARTERIOVENOUS FISTULA
Anesthesia: General | Laterality: Left

## 2018-01-04 MED ORDER — PHENYLEPHRINE HCL 10 MG/ML IJ SOLN
INTRAMUSCULAR | Status: DC | PRN
  Administered 2018-01-04 (×3): 200 ug via INTRAVENOUS

## 2018-01-04 MED ORDER — SUCCINYLCHOLINE CHLORIDE 20 MG/ML IJ SOLN
INTRAMUSCULAR | Status: AC
  Filled 2018-01-04: qty 1

## 2018-01-04 MED ORDER — EPHEDRINE SULFATE 50 MG/ML IJ SOLN
INTRAMUSCULAR | Status: AC
  Filled 2018-01-04: qty 1

## 2018-01-04 MED ORDER — BUPIVACAINE LIPOSOME 1.3 % IJ SUSP
INTRAMUSCULAR | Status: AC
  Filled 2018-01-04: qty 20

## 2018-01-04 MED ORDER — BUPIVACAINE HCL (PF) 0.5 % IJ SOLN
INTRAMUSCULAR | Status: DC | PRN
  Administered 2018-01-04: 20 mL
  Administered 2018-01-04: 10 mL

## 2018-01-04 MED ORDER — FAMOTIDINE 20 MG PO TABS
ORAL_TABLET | ORAL | Status: AC
  Administered 2018-01-04: 20 mg via ORAL
  Filled 2018-01-04: qty 1

## 2018-01-04 MED ORDER — BUPIVACAINE LIPOSOME 1.3 % IJ SUSP
INTRAMUSCULAR | Status: DC | PRN
  Administered 2018-01-04: 20 mL

## 2018-01-04 MED ORDER — DEXAMETHASONE SODIUM PHOSPHATE 10 MG/ML IJ SOLN
INTRAMUSCULAR | Status: AC
  Filled 2018-01-04: qty 1

## 2018-01-04 MED ORDER — SUGAMMADEX SODIUM 200 MG/2ML IV SOLN
INTRAVENOUS | Status: DC | PRN
  Administered 2018-01-04: 200 mg via INTRAVENOUS

## 2018-01-04 MED ORDER — LIDOCAINE HCL (PF) 2 % IJ SOLN
INTRAMUSCULAR | Status: AC
  Filled 2018-01-04: qty 10

## 2018-01-04 MED ORDER — LIDOCAINE HCL (CARDIAC) PF 100 MG/5ML IV SOSY
PREFILLED_SYRINGE | INTRAVENOUS | Status: DC | PRN
  Administered 2018-01-04: 100 mg via INTRAVENOUS

## 2018-01-04 MED ORDER — MIDAZOLAM HCL 2 MG/2ML IJ SOLN
INTRAMUSCULAR | Status: AC
  Filled 2018-01-04: qty 2

## 2018-01-04 MED ORDER — ONDANSETRON HCL 4 MG/2ML IJ SOLN
INTRAMUSCULAR | Status: AC
  Filled 2018-01-04: qty 2

## 2018-01-04 MED ORDER — HYDROMORPHONE HCL 1 MG/ML IJ SOLN: 1.0000 mg | Freq: Once | INTRAMUSCULAR | Status: DC | PRN

## 2018-01-04 MED ORDER — FAMOTIDINE 20 MG PO TABS
20.0000 mg | ORAL_TABLET | Freq: Once | ORAL | Status: AC
  Administered 2018-01-04: 20 mg via ORAL

## 2018-01-04 MED ORDER — FENTANYL CITRATE (PF) 100 MCG/2ML IJ SOLN
INTRAMUSCULAR | Status: DC | PRN
  Administered 2018-01-04 (×2): 50 ug via INTRAVENOUS

## 2018-01-04 MED ORDER — ROCURONIUM BROMIDE 100 MG/10ML IV SOLN
INTRAVENOUS | Status: DC | PRN
  Administered 2018-01-04: 20 mg via INTRAVENOUS
  Administered 2018-01-04: 10 mg via INTRAVENOUS

## 2018-01-04 MED ORDER — CLINDAMYCIN PHOSPHATE 900 MG/50ML IV SOLN
INTRAVENOUS | Status: AC
  Filled 2018-01-04: qty 50

## 2018-01-04 MED ORDER — PROPOFOL 10 MG/ML IV BOLUS
INTRAVENOUS | Status: AC
  Filled 2018-01-04: qty 20

## 2018-01-04 MED ORDER — HEPARIN SODIUM (PORCINE) 5000 UNIT/ML IJ SOLN
INTRAMUSCULAR | Status: AC
  Filled 2018-01-04: qty 1

## 2018-01-04 MED ORDER — MIDAZOLAM HCL 2 MG/2ML IJ SOLN
INTRAMUSCULAR | Status: DC | PRN
  Administered 2018-01-04: 2 mg via INTRAVENOUS

## 2018-01-04 MED ORDER — PROPOFOL 10 MG/ML IV BOLUS
INTRAVENOUS | Status: DC | PRN
  Administered 2018-01-04: 200 mg via INTRAVENOUS
  Administered 2018-01-04: 50 mg via INTRAVENOUS

## 2018-01-04 MED ORDER — SODIUM CHLORIDE 0.9 % IV SOLN
INTRAVENOUS | Status: DC
  Administered 2018-01-04: 07:00:00 via INTRAVENOUS

## 2018-01-04 MED ORDER — BUPIVACAINE HCL (PF) 0.5 % IJ SOLN
INTRAMUSCULAR | Status: AC
  Filled 2018-01-04: qty 30

## 2018-01-04 MED ORDER — ROCURONIUM BROMIDE 50 MG/5ML IV SOLN
INTRAVENOUS | Status: AC
  Filled 2018-01-04: qty 1

## 2018-01-04 MED ORDER — SUCCINYLCHOLINE CHLORIDE 20 MG/ML IJ SOLN
INTRAMUSCULAR | Status: DC | PRN
  Administered 2018-01-04: 140 mg via INTRAVENOUS

## 2018-01-04 MED ORDER — VASOPRESSIN 20 UNIT/ML IV SOLN
INTRAVENOUS | Status: DC | PRN
  Administered 2018-01-04: 1 [IU] via INTRAVENOUS

## 2018-01-04 MED ORDER — SODIUM CHLORIDE 0.9 % IV SOLN
INTRAVENOUS | Status: DC | PRN
  Administered 2018-01-04: 25 ug/min via INTRAVENOUS

## 2018-01-04 MED ORDER — HYDROCODONE-ACETAMINOPHEN 5-325 MG PO TABS: 1.0000 | ORAL_TABLET | Freq: Four times a day (QID) | ORAL | 0 refills | Status: DC | PRN

## 2018-01-04 MED ORDER — ONDANSETRON HCL 4 MG/2ML IJ SOLN: 4.0000 mg | Freq: Once | INTRAMUSCULAR | Status: DC | PRN

## 2018-01-04 MED ORDER — FENTANYL CITRATE (PF) 100 MCG/2ML IJ SOLN: 25.0000 ug | INTRAMUSCULAR | Status: DC | PRN

## 2018-01-04 MED ORDER — ONDANSETRON HCL 4 MG/2ML IJ SOLN
4.0000 mg | Freq: Four times a day (QID) | INTRAMUSCULAR | Status: DC | PRN
  Administered 2018-01-04: 4 mg via INTRAVENOUS

## 2018-01-04 MED ORDER — EPHEDRINE SULFATE 50 MG/ML IJ SOLN
INTRAMUSCULAR | Status: DC | PRN
  Administered 2018-01-04: 10 mg via INTRAVENOUS

## 2018-01-04 MED ORDER — FENTANYL CITRATE (PF) 100 MCG/2ML IJ SOLN
INTRAMUSCULAR | Status: AC
  Filled 2018-01-04: qty 2

## 2018-01-04 SURGICAL SUPPLY — 59 items
APPLIER CLIP 11 MED OPEN (CLIP)
APPLIER CLIP 9.375 SM OPEN (CLIP)
BAG DECANTER FOR FLEXI CONT (MISCELLANEOUS) ×3 IMPLANT
BLADE SURG SZ11 CARB STEEL (BLADE) ×3 IMPLANT
BOOT SUTURE AID YELLOW STND (SUTURE) ×3 IMPLANT
BRUSH SCRUB EZ  4% CHG (MISCELLANEOUS) ×2
BRUSH SCRUB EZ 4% CHG (MISCELLANEOUS) ×1 IMPLANT
CANISTER SUCT 1200ML W/VALVE (MISCELLANEOUS) ×3 IMPLANT
CHLORAPREP W/TINT 26ML (MISCELLANEOUS) ×3 IMPLANT
CLIP APPLIE 11 MED OPEN (CLIP) IMPLANT
CLIP APPLIE 9.375 SM OPEN (CLIP) IMPLANT
COVER PROBE FLX POLY STRL (MISCELLANEOUS) ×3 IMPLANT
DERMABOND ADVANCED (GAUZE/BANDAGES/DRESSINGS) ×2
DERMABOND ADVANCED .7 DNX12 (GAUZE/BANDAGES/DRESSINGS) ×1 IMPLANT
DRESSING SURGICEL FIBRLLR 1X2 (HEMOSTASIS) ×1 IMPLANT
DRSG SURGICEL FIBRILLAR 1X2 (HEMOSTASIS) ×3
ELECT CAUTERY BLADE 6.4 (BLADE) ×3 IMPLANT
ELECT REM PT RETURN 9FT ADLT (ELECTROSURGICAL) ×3
ELECTRODE REM PT RTRN 9FT ADLT (ELECTROSURGICAL) ×1 IMPLANT
GEL ULTRASOUND 20GR AQUASONIC (MISCELLANEOUS) IMPLANT
GLOVE BIO SURGEON STRL SZ7 (GLOVE) IMPLANT
GLOVE INDICATOR 7.5 STRL GRN (GLOVE) IMPLANT
GLOVE SURG SYN 8.0 (GLOVE) ×12 IMPLANT
GOWN STRL REUS W/ TWL LRG LVL3 (GOWN DISPOSABLE) ×1 IMPLANT
GOWN STRL REUS W/ TWL XL LVL3 (GOWN DISPOSABLE) ×1 IMPLANT
GOWN STRL REUS W/TWL LRG LVL3 (GOWN DISPOSABLE) ×2
GOWN STRL REUS W/TWL XL LVL3 (GOWN DISPOSABLE) ×2
IV NS 500ML (IV SOLUTION) ×2
IV NS 500ML BAXH (IV SOLUTION) ×1 IMPLANT
KIT TURNOVER KIT A (KITS) ×3 IMPLANT
LABEL OR SOLS (LABEL) ×3 IMPLANT
LOOP RED MAXI  1X406MM (MISCELLANEOUS) ×2
LOOP VESSEL MAXI 1X406 RED (MISCELLANEOUS) ×1 IMPLANT
LOOP VESSEL MINI 0.8X406 BLUE (MISCELLANEOUS) ×2 IMPLANT
LOOPS BLUE MINI 0.8X406MM (MISCELLANEOUS) ×4
NEEDLE FILTER BLUNT 18X 1/2SAF (NEEDLE) ×2
NEEDLE FILTER BLUNT 18X1 1/2 (NEEDLE) ×1 IMPLANT
NEEDLE HYPO 22GX1.5 SAFETY (NEEDLE) ×3 IMPLANT
NEEDLE HYPO 30X.5 LL (NEEDLE) IMPLANT
NS IRRIG 500ML POUR BTL (IV SOLUTION) IMPLANT
PACK EXTREMITY ARMC (MISCELLANEOUS) ×3 IMPLANT
PAD PREP 24X41 OB/GYN DISP (PERSONAL CARE ITEMS) ×3 IMPLANT
PUNCH SURGICAL ROTATE 2.7MM (MISCELLANEOUS) IMPLANT
STOCKINETTE STRL 4IN 9604848 (GAUZE/BANDAGES/DRESSINGS) ×3 IMPLANT
SUT MNCRL+ 5-0 UNDYED PC-3 (SUTURE) ×1 IMPLANT
SUT MONOCRYL 5-0 (SUTURE) ×2
SUT PROLENE 6 0 BV (SUTURE) ×9 IMPLANT
SUT PROLENE 7 0 BV 1 (SUTURE) ×3 IMPLANT
SUT SILK 2 0 (SUTURE) ×2
SUT SILK 2-0 18XBRD TIE 12 (SUTURE) ×1 IMPLANT
SUT SILK 3 0 (SUTURE) ×2
SUT SILK 3-0 18XBRD TIE 12 (SUTURE) ×1 IMPLANT
SUT SILK 4 0 (SUTURE) ×2
SUT SILK 4-0 18XBRD TIE 12 (SUTURE) ×1 IMPLANT
SUT VIC AB 3-0 SH 27 (SUTURE) ×4
SUT VIC AB 3-0 SH 27X BRD (SUTURE) ×2 IMPLANT
SYR 20CC LL (SYRINGE) ×9 IMPLANT
SYR 3ML LL SCALE MARK (SYRINGE) ×3 IMPLANT
TOWEL OR 17X26 4PK STRL BLUE (TOWEL DISPOSABLE) IMPLANT

## 2018-01-04 NOTE — Discharge Instructions (Signed)

## 2018-01-04 NOTE — H&P (Signed)
[facility] Admission History & Physical  MRN : [id number]  Donald Fernandez is a 47 y.o. (16-Jun-1970) male who presents with chief complaint of my fistula doesn't work.  History of Present Illness:  The patient presents to [facility] today for revision of his fistula.  He has been maintained with a hemodialysis catheter.  Catheter is been working well.  Patient underwent fistula creation several months ago but given the size of his arm the vein itself is still not palpable and will therefore require super fistulization  Patient denies pain or tenderness overlying the access.  There is no pain with dialysis.  The patient denies hand pain or finger pain consistent with steal syndrome.   There have been 1 past interventions no declots of this access.  The patient is not chronically hypotensive on dialysis.  Current Facility-Administered Medications  Medication Dose Route Frequency Provider Last Rate Last Dose  . 0.9 %  sodium chloride infusion   Intravenous Continuous Martha Clan, MD 10 mL/hr at 01/04/18 0654    . clindamycin (CLEOCIN) 900 MG/50ML IVPB           . clindamycin (CLEOCIN) IVPB 900 mg  900 mg Intravenous On Call to OR Kris Hartmann, NP      . HYDROmorphone (DILAUDID) injection 1 mg  1 mg Intravenous Once PRN Kris Hartmann, NP      . ondansetron [facility]) injection 4 mg  4 mg Intravenous Q6H PRN Kris Hartmann, NP        Past Medical History:  Diagnosis Date  . Arthritis   . Asthma    triggered by enviornmental allergies  . BPH (benign prostatic hyperplasia)   . Diabetes mellitus without complication ([place])   . Dialysis patient [facility])    dialysis T-TH-SAT  . GERD (gastroesophageal reflux disease)   . Gout   . Hyperlipidemia   . Hypertension   . Neuropathy   . Renal disorder    CKD4; end stage renal disease  . Restless legs   . Sleep apnea    uses cpap    Past Surgical History:  Procedure Laterality Date  .  A/V FISTULAGRAM N/A 06/08/2017   Procedure: A/V FISTULAGRAM;  Surgeon: Algernon Huxley, MD;  Location: [place] [facility];  Service: Cardiovascular;  Laterality: N/A;  . ANKLE SURGERY Right    fracture repair with pins, plates, screws  . AV FISTULA INSERTION W/ RF MAGNETIC GUIDANCE Left 09/02/2017   Procedure: AV FISTULA INSERTION W/RF MAGNETIC GUIDANCE;  Surgeon: Katha Cabal, MD;  Location: [place] [facility];  Service: Cardiovascular;  Laterality: Left;  . AV FISTULA PLACEMENT Left 08/04/2016   Procedure: ARTERIOVENOUS (AV) FISTULA CREATION ( RADIOCEPHALIC );  Surgeon: Katha Cabal, MD;  Location: [facility] OR[facility];  Service: Vascular;  Laterality: Left;  . DIALYSIS/PERMA CATHETER INSERTION N/A 06/06/2017   Procedure: DIALYSIS/PERMA CATHETER INSERTION;  Surgeon: Algernon Huxley, MD;  Location: [place] [facility];  Service: Cardiovascular;  Laterality: N/A;  . IR ANGIO INTRA EXTRACRAN SEL INTERNAL CAROTID UNI R MOD SED  12/17/2016  . RADIOLOGY WITH ANESTHESIA N/A 12/17/2016   Procedure: Arteriogram/Venogram with right venous sinus manometry;  Surgeon: Consuella Lose, MD;  Location: [place];  Service: Radiology;  Laterality: N/A;  . UPPER EXTREMITY ANGIOGRAPHY Left 10/19/2017   Procedure: UPPER EXTREMITY ANGIOGRAPHY;  Surgeon: Katha Cabal, MD;  Location: [place] [facility];  Service: Cardiovascular;  Laterality: Left;    Social History Social  History   Tobacco Use  . Smoking status: Current Every Day Smoker    Types: Cigarettes  . Smokeless tobacco: Never Used  . Tobacco comment: APPROX. 1 PK/WEEK   Substance Use Topics  . Alcohol use: Yes    Comment: weekends  . Drug use: Yes    Types: Marijuana    Comment: LAST TIME APPROX. 08/03/17    Family History Family History  Problem Relation Age of Onset  . AAA (abdominal aortic aneurysm) Mother   . Diabetes Mother   . Heart disease Father     No family history of bleeding or clotting disorders, autoimmune disease or  porphyria  Allergies  Allergen Reactions  . Accupril [Quinapril Hcl] Anaphylaxis    Angioedema  . Ace Inhibitors Anaphylaxis    Angioedema     REVIEW OF SYSTEMS (Negative unless checked)  Constitutional: [] Weight loss  [] Fever  [] Chills Cardiac: [] Chest pain   [] Chest pressure   [] Palpitations   [] Shortness of breath when laying flat   [] Shortness of breath at rest   [x] Shortness of breath with exertion. Vascular:  [] Pain in legs with walking   [] Pain in legs at rest   [] Pain in legs when laying flat   [] Claudication   [] Pain in feet when walking  [] Pain in feet at rest  [] Pain in feet when laying flat   [] History of DVT   [] Phlebitis   [] Swelling in legs   [] Varicose veins   [] Non-healing ulcers Pulmonary:   [] Uses home oxygen   [] Productive cough   [] Hemoptysis   [] Wheeze  [] COPD   [] Asthma Neurologic:  [] Dizziness  [] Blackouts   [] Seizures   [] History of stroke   [] History of TIA  [] Aphasia   [] Temporary blindness   [] Dysphagia   [] Weakness or numbness in arms   [] Weakness or numbness in legs Musculoskeletal:  [] Arthritis   [] Joint swelling   [] Joint pain   [] Low back pain Hematologic:  [] Easy bruising  [] Easy bleeding   [] Hypercoagulable state   [] Anemic  [] Hepatitis Gastrointestinal:  [] Blood in stool   [] Vomiting blood  [] Gastroesophageal reflux/heartburn   [] Difficulty swallowing. Genitourinary:  [x] Chronic kidney disease   [] Difficult urination  [] Frequent urination  [] Burning with urination   [] Blood in urine Skin:  [] Rashes   [] Ulcers   [] Wounds Psychological:  [] History of anxiety   []  History of major depression.  Physical Examination  Vitals:   01/04/18 0615  BP: (!) 149/112  Pulse: 83  Resp: 18  Temp: 97.8 F (36.6 C)  TempSrc: Temporal  SpO2: 100%  Weight: (!) 148.4 kg   Body mass index is 45.62 kg/m. Gen: WD/WN, NAD Head: [place]/AT, No temporalis wasting. Prominent temp pulse not noted. Ear/Nose/Throat: Hearing grossly intact, nares w/o erythema or drainage,  oropharynx w/o Erythema/Exudate,  Eyes: Conjunctiva clear, sclera non-icteric Neck: Trachea midline.  No JVD.  Pulmonary:  Good air movement, respirations not labored, no use of accessory muscles.  Cardiac: RRR, normal S1, S2. Vascular: Left arm endovascular fistula palpable thrill at the antecubital fossa excellent bruit Vessel Right Left  Radial Palpable Palpable  Ulnar Not Palpable Not Palpable  Brachial Palpable Palpable  Carotid Palpable, without bruit Palpable, without bruit  Gastrointestinal: soft, non-tender/non-distended. No guarding/reflex.  Musculoskeletal: M/S 5/5 throughout.  Extremities without ischemic changes.  No deformity or atrophy.  Neurologic: Sensation grossly intact in extremities.  Symmetrical.  Speech is fluent. Motor exam as listed above. Psychiatric: Judgment intact, Mood & affect appropriate for pt's clinical situation. Dermatologic: No rashes or ulcers noted.  No cellulitis or open wounds. Lymph : No Cervical, Axillary, or Inguinal lymphadenopathy.   CBC Lab Results  Component Value Date   WBC 7.9 12/14/2017   HGB 12.6 (L) 01/04/2018   HCT 37.0 (L) 01/04/2018   MCV 93.5 12/14/2017   PLT 247 12/14/2017    BMET    Component Value Date/Time   NA 140 01/04/2018 0628   NA 145 12/31/2011 1850   K 3.5 01/04/2018 0628   K 3.5 12/31/2011 1850   CL 100 12/14/2017 1400   CL 108 (H) 12/31/2011 1850   CO2 22 12/14/2017 1400   CO2 28 12/31/2011 1850   GLUCOSE 97 01/04/2018 0628   GLUCOSE 63 (L) 12/31/2011 1850   BUN 52 (H) 12/14/2017 1400   BUN 14 12/31/2011 1850   CREATININE 12.01 (H) 12/14/2017 1400   CREATININE 1.72 (H) 12/31/2011 1850   CALCIUM 8.4 (L) 12/14/2017 1400   CALCIUM 8.1 (L) 06/06/2017 2038   GFRNONAA 4 (L) 12/14/2017 1400   GFRNONAA 48 (L) 12/31/2011 1850   GFRAA 5 (L) 12/14/2017 1400   GFRAA 55 (L) 12/31/2011 1850   Estimated Creatinine Clearance: 11 mL/min (A) (by C-G formula based on SCr of 12.01 mg/dL (H)).  COAG Lab Results   Component Value Date   INR 0.88 12/14/2017   INR 0.90 09/02/2017   INR 0.91 08/08/2017    Radiology No results found.  Assessment/Plan 1.  Complication dialysis device with thrombosis AV access:  Patient's left arm fistula dialysis access is not readily accessible for cannulation. The patient will undergo revision and correction of any problems using open surgical techniques with the hope of restoring function to the access.  The risks and benefits were described to the patient.  All questions were answered.  The patient agrees to proceed with angiography and intervention. Potassium will be drawn to ensure that it is an appropriate level prior to performing intervention. 2.  End-stage renal disease requiring hemodialysis:  Patient will continue dialysis therapy without further interruption if a successful intervention is not achieved then a tunneled catheter will be placed. Dialysis has already been arranged. 3.  Hypertension:  Patient will continue medical management; nephrology is following no changes in oral medications. 4. Diabetes mellitus:  Glucose will be monitored and oral medications been held this morning once the patient has undergone the patient's procedure po intake will be reinitiated and again Accu-Cheks will be used to assess the blood glucose level and treat as needed. The patient will be restarted on the patient's usual hypoglycemic regime 5.  GERD:  Continue PPI as already ordered, this medication has been reviewed and there are no changes at this time.  Avoidence of caffeine and alcohol.  Moderate elevation of the head of the bed     Hortencia Pilar, MD  01/04/2018 7:16 AM

## 2018-01-04 NOTE — Anesthesia Post-op Follow-up Note (Signed)
Anesthesia QCDR form completed.        

## 2018-01-04 NOTE — Anesthesia Procedure Notes (Signed)
Procedure Name: Intubation Date/Time: 01/04/2018 7:40 AM Performed by: Philbert Riser, CRNA Pre-anesthesia Checklist: Patient identified, Emergency Drugs available, Suction available, Patient being monitored and Timeout performed Patient Re-evaluated:Patient Re-evaluated prior to induction Oxygen Delivery Method: Circle system utilized and Simple face mask Preoxygenation: Pre-oxygenation with 100% oxygen Induction Type: IV induction Ventilation: Mask ventilation without difficulty Laryngoscope Size: McGraph and 4 Grade View: Grade I Tube type: Oral Tube size: 7.5 mm Number of attempts: 1 Airway Equipment and Method: Stylet Placement Confirmation: ETT inserted through vocal cords under direct vision,  positive ETCO2 and breath sounds checked- equal and bilateral Secured at: 20 cm Tube secured with: Tape Dental Injury: Teeth and Oropharynx as per pre-operative assessment

## 2018-01-04 NOTE — Anesthesia Preprocedure Evaluation (Addendum)
Anesthesia Evaluation  Patient identified by MRN, date of birth, ID band Patient awake    Reviewed: Allergy & Precautions, H&P , NPO status , Patient's Chart, lab work & pertinent test results  History of Anesthesia Complications Negative for: history of anesthetic complications  Airway Mallampati: III  TM Distance: >3 FB Neck ROM: full    Dental  (+) Chipped, Poor Dentition   Pulmonary asthma , sleep apnea and Continuous Positive Airway Pressure Ventilation , Current Smoker,           Cardiovascular Exercise Tolerance: Good hypertension,      Neuro/Psych negative neurological ROS  negative psych ROS   GI/Hepatic Neg liver ROS, GERD  Medicated and Controlled,  Endo/Other  diabetes, Type 2  Renal/GU DialysisRenal disease     Musculoskeletal  (+) Arthritis ,   Abdominal   Peds  Hematology negative hematology ROS (+)   Anesthesia Other Findings Past Medical History: No date: Arthritis No date: Asthma     Comment:  triggered by enviornmental allergies No date: BPH (benign prostatic hyperplasia) No date: Diabetes mellitus without complication ([facility]) No date: Dialysis patient ([place])     Comment:  dialysis T-TH-SAT No date: GERD (gastroesophageal reflux disease) No date: Gout No date: Hyperlipidemia No date: Hypertension No date: Neuropathy No date: Renal disorder     Comment:  CKD4; end stage renal disease No date: Restless legs No date: Sleep apnea     Comment:  uses cpap  Past Surgical History: 06/08/2017: A/V FISTULAGRAM; N/A     Comment:  Procedure: A/V FISTULAGRAM;  Surgeon: Algernon Huxley, MD;               Location: [place] [facility];  Service: Cardiovascular;              Laterality: N/A; No date: ANKLE SURGERY; Right     Comment:  fracture repair with pins, plates, screws 09/02/2017: AV FISTULA INSERTION W/ RF MAGNETIC GUIDANCE; Left     Comment:  Procedure: AV FISTULA INSERTION W/RF MAGNETIC  GUIDANCE;               Surgeon: Katha Cabal, MD;  Location: [place]              CV LAB;  Service: Cardiovascular;  Laterality: Left; 08/04/2016: AV FISTULA PLACEMENT; Left     Comment:  Procedure: ARTERIOVENOUS (AV) FISTULA CREATION (               RADIOCEPHALIC );  Surgeon: Katha Cabal, MD;                Location: [facility] ORS;  Service: Vascular;  Laterality:               Left; 06/06/2017: DIALYSIS/PERMA CATHETER INSERTION; N/A     Comment:  Procedure: DIALYSIS/PERMA CATHETER INSERTION;  Surgeon:               Algernon Huxley, MD;  Location: [place] [facility];                Service: Cardiovascular;  Laterality: N/A; 12/17/2016: IR ANGIO INTRA EXTRACRAN SEL INTERNAL CAROTID UNI R MOD SED 12/17/2016: RADIOLOGY WITH ANESTHESIA; N/A     Comment:  Procedure: Arteriogram/Venogram with right venous sinus               manometry;  Surgeon: Consuella Lose, MD;  Location:               [place];  Service: Radiology;  Laterality: N/A; 10/19/2017: UPPER EXTREMITY ANGIOGRAPHY; Left     Comment:  Procedure: UPPER EXTREMITY ANGIOGRAPHY;  Surgeon:               Katha Cabal, MD;  Location: [place] [facility];               Service: Cardiovascular;  Laterality: Left;  BMI    Body Mass Index:  45.62 kg/m      Reproductive/Obstetrics negative OB ROS                             Anesthesia Physical Anesthesia Plan  ASA: IV  Anesthesia Plan: General ETT   Post-op Pain Management:    Induction: Intravenous, Rapid sequence and Cricoid pressure planned  PONV Risk Score and Plan: Ondansetron, Dexamethasone, Midazolam and Treatment may vary due to age or medical condition  Airway Management Planned: Oral ETT and Video Laryngoscope Planned  Additional Equipment:   Intra-op Plan:   Post-operative Plan: Extubation in OR  Informed Consent: I have reviewed the patients History and Physical, chart, labs and discussed the procedure including the risks,  benefits and alternatives for the proposed anesthesia with the patient or authorized representative who has indicated his/her understanding and acceptance.   Dental Advisory Given  Plan Discussed with: Anesthesiologist, CRNA and Surgeon  Anesthesia Plan Comments: (Patient consented for risks of anesthesia including but not limited to:  - adverse reactions to medications - damage to teeth, lips or other oral mucosa - sore throat or hoarseness - Damage to heart, brain, lungs or loss of life  Patient voiced understanding.)       Anesthesia Quick Evaluation

## 2018-01-04 NOTE — Anesthesia Postprocedure Evaluation (Signed)
Anesthesia Post Note  Patient: Donald Fernandez  Procedure(s) Performed: REVISON OF ARTERIOVENOUS FISTULA ( BRACHIALCEPHALIC ) (Left )  Patient location during evaluation: PACU Anesthesia Type: General Level of consciousness: awake and alert and oriented Pain management: pain level controlled Vital Signs Assessment: post-procedure vital signs reviewed and stable Respiratory status: spontaneous breathing Cardiovascular status: blood pressure returned to baseline Anesthetic complications: no     Last Vitals:  Vitals:   01/04/18 1001 01/04/18 1009  BP: 119/80 120/80  Pulse: 89 87  Resp: (!) 21 18  Temp:  36.6 C  SpO2: 99% 96%    Last Pain:  Vitals:   01/04/18 1009  TempSrc: Oral  PainSc: 0-No pain                 Thaddius Manes

## 2018-01-04 NOTE — Op Note (Signed)
    OPERATIVE NOTE   PROCEDURE: Revision of left arm endovenous fistula with superficialization of the cephalic vein  PRE-OPERATIVE DIAGNOSIS: Complication dialysis access; end-stage renal disease requiring hemodialysis  POST-OPERATIVE DIAGNOSIS: Same  SURGEON: Hortencia Pilar  ASSISTANT(S): None  ANESTHESIA: general  ESTIMATED BLOOD LOSS: 25 cc  FINDING(S): 8 to 10 mm cephalic vein  SPECIMEN(S): Resected subcutaneous tissues to pathology  INDICATIONS:   Donald Fernandez is a 47 y.o. male who presents with functioning fistula that is not accessible.  He is therefore undergoing a revision during the vein closer to the skin to allow for easy cannulation.  Risks and benefits of been reviewed patient agrees to proceed..  DESCRIPTION: After full informed written consent was obtained from the patient, the patient was brought back to the operating room and placed supine upon the operating table.  Prior to induction, the patient received IV antibiotics.   After obtaining adequate anesthesia, the patient was then prepped and draped in the standard fashion for a revision left arm AV fistula.  Ultrasound is then utilized to map the vein and is marked with a surgical marker on the skin.  Linear incision is made along a 12 to 14 cm length of the vein from the antecubital fossa more proximally.  The subcutaneous tissues are then dissected with Bovie cautery and Metzenbaum scissors to expose the cephalic vein.  The cephalic vein is then dissected circumferentially from the antecubital fossa proximally.  Once the vein is been freed in its entirety to the subcutaneous tissues are dressed.  Using a combination of Bovie cautery and Metzenbaum scissors a wedge of subcutaneous tissues extending from the skin edge down to the fascia is then resected both medially and laterally.  This tissue was passed off the field the specimen.  Hemostasis then obtained with Bovie cautery.  The skin is then  reapproximated over the vein using interrupted deep dermal sutures followed by a 4-0 Monocryl subcuticular.  Prior to closing the skin long-acting local anesthetic is infused throughout the wound.  Dermabond was then applied.  The patient tolerated this procedure well.   COMPLICATIONS: None  CONDITION: Donald Fernandez  [facility]  Office: [phone]   01/04/2018, 9:20 AM

## 2018-01-04 NOTE — Transfer of Care (Signed)
Immediate Anesthesia Transfer of Care Note  Patient: Donald Fernandez  Procedure(s) Performed: REVISON OF ARTERIOVENOUS FISTULA ( BRACHIALCEPHALIC ) (Left )  Patient Location: PACU  Anesthesia Type:General  Level of Consciousness: awake and alert   Airway & Oxygen Therapy: Patient Spontanous Breathing and Patient connected to face mask oxygen  Post-op Assessment: Report given to RN and Post -op Vital signs reviewed and stable  Post vital signs: Reviewed and stable    Last Vitals:  Vitals Value Taken Time  BP    Temp    Pulse 95 01/04/2018  9:29 AM  Resp 19 01/04/2018  9:29 AM  SpO2 100 % 01/04/2018  9:29 AM  Vitals shown include unvalidated device data.  Last Pain:  Vitals:   01/04/18 0615  TempSrc: Temporal  PainSc: 0-No pain         Complications: No apparent anesthesia complications

## 2018-01-06 LAB — SURGICAL PATHOLOGY

## 2018-01-09 DIAGNOSIS — Z992 Dependence on renal dialysis: Secondary | ICD-10-CM | POA: Diagnosis not present

## 2018-01-09 DIAGNOSIS — N186 End stage renal disease: Secondary | ICD-10-CM | POA: Diagnosis not present

## 2018-01-10 DIAGNOSIS — N2581 Secondary hyperparathyroidism of renal origin: Secondary | ICD-10-CM | POA: Diagnosis not present

## 2018-01-10 DIAGNOSIS — N186 End stage renal disease: Secondary | ICD-10-CM | POA: Diagnosis not present

## 2018-01-10 DIAGNOSIS — Z992 Dependence on renal dialysis: Secondary | ICD-10-CM | POA: Diagnosis not present

## 2018-01-10 DIAGNOSIS — D509 Iron deficiency anemia, unspecified: Secondary | ICD-10-CM | POA: Diagnosis not present

## 2018-01-10 DIAGNOSIS — D631 Anemia in chronic kidney disease: Secondary | ICD-10-CM | POA: Diagnosis not present

## 2018-01-10 DIAGNOSIS — Z23 Encounter for immunization: Secondary | ICD-10-CM | POA: Diagnosis not present

## 2018-01-19 ENCOUNTER — Ambulatory Visit (INDEPENDENT_AMBULATORY_CARE_PROVIDER_SITE_OTHER): Payer: PPO | Admitting: Vascular Surgery

## 2018-01-19 ENCOUNTER — Encounter (INDEPENDENT_AMBULATORY_CARE_PROVIDER_SITE_OTHER): Payer: Self-pay | Admitting: Vascular Surgery

## 2018-01-19 VITALS — BP 151/87 | HR 81 | Resp 16 | Ht 71.0 in | Wt 322.0 lb

## 2018-01-19 DIAGNOSIS — T829XXS Unspecified complication of cardiac and vascular prosthetic device, implant and graft, sequela: Secondary | ICD-10-CM

## 2018-01-20 ENCOUNTER — Encounter (INDEPENDENT_AMBULATORY_CARE_PROVIDER_SITE_OTHER): Payer: Self-pay | Admitting: Vascular Surgery

## 2018-01-20 NOTE — Progress Notes (Signed)
Patient ID: Donald Fernandez, male   DOB: Mar 06, 1971, 47 y.o.   MRN: [id number]  Chief Complaint  Patient presents with  . Follow-up    [facility] 2week     HPI Donald Fernandez is a 47 y.o. male.    Patient denies pain at the incision site no hand pain.  Dialysis is being going well via his catheter.   Past Medical History:  Diagnosis Date  . Arthritis   . Asthma    triggered by enviornmental allergies  . BPH (benign prostatic hyperplasia)   . Diabetes mellitus without complication ([place])   . Dialysis patient [facility])    dialysis T-TH-SAT  . GERD (gastroesophageal reflux disease)   . Gout   . Hyperlipidemia   . Hypertension   . Neuropathy   . Renal disorder    CKD4; end stage renal disease  . Restless legs   . Sleep apnea    uses cpap    Past Surgical History:  Procedure Laterality Date  . A/V FISTULAGRAM N/A 06/08/2017   Procedure: A/V FISTULAGRAM;  Surgeon: Algernon Huxley, MD;  Location: [place] [facility];  Service: Cardiovascular;  Laterality: N/A;  . ANKLE SURGERY Right    fracture repair with pins, plates, screws  . AV FISTULA INSERTION W/ RF MAGNETIC GUIDANCE Left 09/02/2017   Procedure: AV FISTULA INSERTION W/RF MAGNETIC GUIDANCE;  Surgeon: Katha Cabal, MD;  Location: [place] [facility];  Service: Cardiovascular;  Laterality: Left;  . AV FISTULA PLACEMENT Left 08/04/2016   Procedure: ARTERIOVENOUS (AV) FISTULA CREATION ( RADIOCEPHALIC );  Surgeon: Katha Cabal, MD;  Location: [facility];  Service: Vascular;  Laterality: Left;  . DIALYSIS/PERMA CATHETER INSERTION N/A 06/06/2017   Procedure: DIALYSIS/PERMA CATHETER INSERTION;  Surgeon: Algernon Huxley, MD;  Location: [place] [facility];  Service: Cardiovascular;  Laterality: N/A;  . IR ANGIO INTRA EXTRACRAN SEL INTERNAL CAROTID UNI R MOD SED  12/17/2016  . RADIOLOGY WITH ANESTHESIA N/A 12/17/2016   Procedure: Arteriogram/Venogram with right venous sinus manometry;  Surgeon: Consuella Lose, MD;  Location: [place];   Service: Radiology;  Laterality: N/A;  . REVISON OF ARTERIOVENOUS FISTULA Left 01/04/2018   Procedure: REVISON OF ARTERIOVENOUS FISTULA ( BRACHIALCEPHALIC );  Surgeon: Katha Cabal, MD;  Location: [facility];  Service: Vascular;  Laterality: Left;  . UPPER EXTREMITY ANGIOGRAPHY Left 10/19/2017   Procedure: UPPER EXTREMITY ANGIOGRAPHY;  Surgeon: Katha Cabal, MD;  Location: [place] [facility];  Service: Cardiovascular;  Laterality: Left;      Allergies  Allergen Reactions  . Accupril [Quinapril Hcl] Anaphylaxis    Angioedema  . Ace Inhibitors Anaphylaxis    Angioedema    Current Outpatient Medications  Medication Sig Dispense Refill  . allopurinol (ZYLOPRIM) 100 MG tablet Take 100 mg by mouth daily.    Marland Kitchen amLODipine (NORVASC) 10 MG tablet Take 10 mg by mouth daily.    Marland Kitchen aspirin EC 81 MG tablet Take 81 mg by mouth daily.    Marland Kitchen atorvastatin (LIPITOR) 20 MG tablet Take 20 mg by mouth daily.     . Cholecalciferol 1000 units tablet Take 1,000 Units by mouth daily.     . cloNIDine (CATAPRES) 0.1 MG tablet Take 1 tablet (0.1 mg total) by mouth 3 (three) times daily. 90 tablet 0  . doxazosin (CARDURA) 8 MG tablet Take 8 mg by mouth at bedtime.    . fluticasone (FLONASE) 50 MCG/ACT nasal spray Place 1 spray into both nostrils daily as  needed for allergies or rhinitis.    . furosemide (LASIX) 40 MG tablet Take 40 mg by mouth daily.     Marland Kitchen gabapentin (NEURONTIN) 100 MG capsule Take 1 capsule (100 mg total) by mouth 3 (three) times daily. 90 capsule 2  . glipiZIDE (GLUCOTROL XL) 2.5 MG 24 hr tablet Take 2.5 mg by mouth daily.  2  . hydrALAZINE (APRESOLINE) 100 MG tablet Take 100 mg by mouth 3 (three) times daily.     Marland Kitchen HYDROcodone-acetaminophen (NORCO) 5-325 MG tablet Take 1-2 tablets by mouth every 6 (six) hours as needed. 40 tablet 0  . tamsulosin (FLOMAX) 0.4 MG CAPS capsule Take 0.8 mg by mouth daily after breakfast.     . Testosterone (ANDROGEL) 20.25 MG/1.25GM (1.62%) GEL Place 4  Pump onto the skin daily.    Marland Kitchen topiramate (TOPAMAX) 25 MG tablet Take 50 mg by mouth at bedtime.    . traZODone (DESYREL) 100 MG tablet Take 100 mg by mouth at bedtime.    . vitamin E 400 UNIT capsule Take 400 Units by mouth daily.     No current facility-administered medications for this visit.         Physical Exam BP (!) 151/87 (BP Location: Right Arm)   Pulse 81   Resp 16   Ht 5\' 11"  (1.803 m)   Wt (!) 322 lb (146.1 kg)   BMI 44.91 kg/m  Gen:  WD/WN, NAD Skin: incision C/D/I; good thrill good bruit     Assessment/Plan:  1. Complication of vascular access for dialysis, sequela Patient is currently doing well his incision is healing nicely and he has a good thrill good bruit.  I will see him back in 1 month with a duplex ultrasound hopefully we will be able to begin cannulating at that point. - VAS [place] (AVF, AVG); Future      Hortencia Pilar 01/20/2018, 12:20 PM   This note was created with Dragon medical transcription system.  Any errors from dictation are unintentional.

## 2018-02-07 DIAGNOSIS — Z992 Dependence on renal dialysis: Secondary | ICD-10-CM | POA: Diagnosis not present

## 2018-02-07 DIAGNOSIS — E119 Type 2 diabetes mellitus without complications: Secondary | ICD-10-CM | POA: Diagnosis not present

## 2018-02-09 DIAGNOSIS — N186 End stage renal disease: Secondary | ICD-10-CM | POA: Diagnosis not present

## 2018-02-09 DIAGNOSIS — Z992 Dependence on renal dialysis: Secondary | ICD-10-CM | POA: Diagnosis not present

## 2018-02-11 DIAGNOSIS — N186 End stage renal disease: Secondary | ICD-10-CM | POA: Diagnosis not present

## 2018-02-11 DIAGNOSIS — Z992 Dependence on renal dialysis: Secondary | ICD-10-CM | POA: Diagnosis not present

## 2018-02-11 DIAGNOSIS — D509 Iron deficiency anemia, unspecified: Secondary | ICD-10-CM | POA: Diagnosis not present

## 2018-02-11 DIAGNOSIS — N2581 Secondary hyperparathyroidism of renal origin: Secondary | ICD-10-CM | POA: Diagnosis not present

## 2018-02-11 DIAGNOSIS — D631 Anemia in chronic kidney disease: Secondary | ICD-10-CM | POA: Diagnosis not present

## 2018-02-23 ENCOUNTER — Ambulatory Visit (INDEPENDENT_AMBULATORY_CARE_PROVIDER_SITE_OTHER): Payer: PPO

## 2018-02-23 ENCOUNTER — Ambulatory Visit (INDEPENDENT_AMBULATORY_CARE_PROVIDER_SITE_OTHER): Payer: PPO | Admitting: Vascular Surgery

## 2018-02-23 ENCOUNTER — Encounter (INDEPENDENT_AMBULATORY_CARE_PROVIDER_SITE_OTHER): Payer: Self-pay | Admitting: Vascular Surgery

## 2018-02-23 VITALS — BP 116/79 | HR 82 | Resp 18 | Ht 71.0 in | Wt 321.0 lb

## 2018-02-23 DIAGNOSIS — T829XXS Unspecified complication of cardiac and vascular prosthetic device, implant and graft, sequela: Secondary | ICD-10-CM

## 2018-02-23 DIAGNOSIS — N186 End stage renal disease: Secondary | ICD-10-CM

## 2018-02-23 DIAGNOSIS — Z992 Dependence on renal dialysis: Secondary | ICD-10-CM

## 2018-02-25 ENCOUNTER — Encounter (INDEPENDENT_AMBULATORY_CARE_PROVIDER_SITE_OTHER): Payer: Self-pay | Admitting: Vascular Surgery

## 2018-02-25 NOTE — Progress Notes (Signed)
Patient ID: Donald Fernandez, male   DOB: 10-04-1970, 47 y.o.   MRN: [id number]  Chief Complaint  Patient presents with  . Follow-up    1 month HDA follow up    HPI Donald Fernandez is a 47 y.o. male.  The patient returns to the office for followup status post superficialization of the dialysis access 01/04/2018.  The patient denies an increase in arm swelling. At the present time the patient denies hand pain.  The patient denies amaurosis fugax or recent TIA symptoms. There are no recent neurological changes noted. The patient denies claudication symptoms or rest pain symptoms. The patient denies history of DVT, PE or superficial thrombophlebitis. The patient denies recent episodes of angina or shortness of breath.         Past Medical History:  Diagnosis Date  . Arthritis   . Asthma    triggered by enviornmental allergies  . BPH (benign prostatic hyperplasia)   . Diabetes mellitus without complication ([place])   . Dialysis patient [facility])    dialysis T-TH-SAT  . GERD (gastroesophageal reflux disease)   . Gout   . Hyperlipidemia   . Hypertension   . Neuropathy   . Renal disorder    CKD4; end stage renal disease  . Restless legs   . Sleep apnea    uses cpap    Past Surgical History:  Procedure Laterality Date  . A/V FISTULAGRAM N/A 06/08/2017   Procedure: A/V FISTULAGRAM;  Surgeon: Algernon Huxley, MD;  Location: [place] [facility];  Service: Cardiovascular;  Laterality: N/A;  . ANKLE SURGERY Right    fracture repair with pins, plates, screws  . AV FISTULA INSERTION W/ RF MAGNETIC GUIDANCE Left 09/02/2017   Procedure: AV FISTULA INSERTION W/RF MAGNETIC GUIDANCE;  Surgeon: Katha Cabal, MD;  Location: [place] [facility];  Service: Cardiovascular;  Laterality: Left;  . AV FISTULA PLACEMENT Left 08/04/2016   Procedure: ARTERIOVENOUS (AV) FISTULA CREATION ( RADIOCEPHALIC );  Surgeon: Katha Cabal, MD;  Location: [facility];  Service: Vascular;  Laterality: Left;    . DIALYSIS/PERMA CATHETER INSERTION N/A 06/06/2017   Procedure: DIALYSIS/PERMA CATHETER INSERTION;  Surgeon: Algernon Huxley, MD;  Location: [place] [facility];  Service: Cardiovascular;  Laterality: N/A;  . IR ANGIO INTRA EXTRACRAN SEL INTERNAL CAROTID UNI R MOD SED  12/17/2016  . RADIOLOGY WITH ANESTHESIA N/A 12/17/2016   Procedure: Arteriogram/Venogram with right venous sinus manometry;  Surgeon: Consuella Lose, MD;  Location: [place];  Service: Radiology;  Laterality: N/A;  . REVISON OF ARTERIOVENOUS FISTULA Left 01/04/2018   Procedure: REVISON OF ARTERIOVENOUS FISTULA ( BRACHIALCEPHALIC );  Surgeon: Katha Cabal, MD;  Location: [facility];  Service: Vascular;  Laterality: Left;  . UPPER EXTREMITY ANGIOGRAPHY Left 10/19/2017   Procedure: UPPER EXTREMITY ANGIOGRAPHY;  Surgeon: Katha Cabal, MD;  Location: [place] [facility];  Service: Cardiovascular;  Laterality: Left;      Allergies  Allergen Reactions  . Accupril [Quinapril Hcl] Anaphylaxis    Angioedema  . Ace Inhibitors Anaphylaxis    Angioedema    Current Outpatient Medications  Medication Sig Dispense Refill  . allopurinol (ZYLOPRIM) 100 MG tablet Take 100 mg by mouth daily.    Marland Kitchen amLODipine (NORVASC) 10 MG tablet Take 10 mg by mouth daily.    Marland Kitchen aspirin EC 81 MG tablet Take 81 mg by mouth daily.    Marland Kitchen atorvastatin (LIPITOR) 20 MG tablet Take 20 mg by mouth daily.     Marland Kitchen  calcium acetate (PHOSLO) 667 MG capsule TAKE 2 CAPSULES BY MOUTH WITH MEALS AND 1 WITH SNACKS  0  . carvedilol (COREG) 3.125 MG tablet Take 3.125 mg by mouth daily.  5  . Cholecalciferol 1000 units tablet Take 1,000 Units by mouth daily.     . cloNIDine (CATAPRES) 0.1 MG tablet Take 1 tablet (0.1 mg total) by mouth 3 (three) times daily. 90 tablet 0  . doxazosin (CARDURA) 8 MG tablet Take 8 mg by mouth at bedtime.    . fluticasone (FLONASE) 50 MCG/ACT nasal spray Place 1 spray into both nostrils daily as needed for allergies or rhinitis.    . furosemide  (LASIX) 40 MG tablet Take 40 mg by mouth daily.     Marland Kitchen gabapentin (NEURONTIN) 100 MG capsule Take 1 capsule (100 mg total) by mouth 3 (three) times daily. 90 capsule 2  . glipiZIDE (GLUCOTROL XL) 2.5 MG 24 hr tablet Take 2.5 mg by mouth daily.  2  . hydrALAZINE (APRESOLINE) 100 MG tablet Take 100 mg by mouth 3 (three) times daily.     Marland Kitchen HYDROcodone-acetaminophen (NORCO) 5-325 MG tablet Take 1-2 tablets by mouth every 6 (six) hours as needed. 40 tablet 0  . tamsulosin (FLOMAX) 0.4 MG CAPS capsule Take 0.8 mg by mouth daily after breakfast.     . Testosterone (ANDROGEL) 20.25 MG/1.25GM (1.62%) GEL Place 4 Pump onto the skin daily.    Marland Kitchen topiramate (TOPAMAX) 25 MG tablet Take 50 mg by mouth at bedtime.    . traZODone (DESYREL) 100 MG tablet Take 100 mg by mouth at bedtime.    . vitamin E 400 UNIT capsule Take 400 Units by mouth daily.     No current facility-administered medications for this visit.         Physical Exam BP 116/79 (BP Location: Right Arm, Patient Position: Sitting)   Pulse 82   Resp 18   Ht 5\' 11"  (1.803 m)   Wt (!) 321 lb (145.6 kg)   BMI 44.77 kg/m  Gen:  WD/WN, NAD Skin: incision C/D/I, good thrill good bruit     Assessment/Plan:  1. ESRD on dialysis [facility]) Recommend:  The patient is doing well and currently has adequate dialysis access. The patient's dialysis center is not reporting any access issues. Flow pattern is stable when compared to the prior ultrasound.  The patient should have a duplex ultrasound of the dialysis access in 6 months.  The patient will follow-up with me in the office after each ultrasound     2. Complication of vascular access for dialysis, sequela Recommend:  The patient is doing well and currently has adequate dialysis access. The patient's dialysis center is not reporting any access issues. Flow pattern is stable when compared to the prior ultrasound.  The patient should have a duplex ultrasound of the dialysis access in 6  months.  The patient will follow-up with me in the office after each ultrasound    - VAS [place] (AVF, AVG); Future      Hortencia Pilar 02/25/2018, 11:33 AM   This note was created with Dragon medical transcription system.  Any errors from dictation are unintentional.

## 2018-03-07 DIAGNOSIS — Z992 Dependence on renal dialysis: Secondary | ICD-10-CM | POA: Diagnosis not present

## 2018-03-11 DIAGNOSIS — N186 End stage renal disease: Secondary | ICD-10-CM | POA: Diagnosis not present

## 2018-03-11 DIAGNOSIS — Z992 Dependence on renal dialysis: Secondary | ICD-10-CM | POA: Diagnosis not present

## 2018-03-14 DIAGNOSIS — N2581 Secondary hyperparathyroidism of renal origin: Secondary | ICD-10-CM | POA: Diagnosis not present

## 2018-03-14 DIAGNOSIS — D509 Iron deficiency anemia, unspecified: Secondary | ICD-10-CM | POA: Diagnosis not present

## 2018-03-14 DIAGNOSIS — D631 Anemia in chronic kidney disease: Secondary | ICD-10-CM | POA: Diagnosis not present

## 2018-03-14 DIAGNOSIS — N186 End stage renal disease: Secondary | ICD-10-CM | POA: Diagnosis not present

## 2018-03-14 DIAGNOSIS — E8779 Other fluid overload: Secondary | ICD-10-CM | POA: Diagnosis not present

## 2018-03-14 DIAGNOSIS — Z992 Dependence on renal dialysis: Secondary | ICD-10-CM | POA: Diagnosis not present

## 2018-03-15 ENCOUNTER — Encounter (INDEPENDENT_AMBULATORY_CARE_PROVIDER_SITE_OTHER): Payer: Self-pay

## 2018-03-15 ENCOUNTER — Other Ambulatory Visit: Payer: Self-pay | Admitting: Internal Medicine

## 2018-03-20 DIAGNOSIS — I10 Essential (primary) hypertension: Secondary | ICD-10-CM | POA: Diagnosis not present

## 2018-03-20 DIAGNOSIS — E119 Type 2 diabetes mellitus without complications: Secondary | ICD-10-CM | POA: Diagnosis not present

## 2018-03-20 DIAGNOSIS — N186 End stage renal disease: Secondary | ICD-10-CM | POA: Diagnosis not present

## 2018-03-20 DIAGNOSIS — H9313 Tinnitus, bilateral: Secondary | ICD-10-CM | POA: Diagnosis not present

## 2018-03-20 DIAGNOSIS — R7309 Other abnormal glucose: Secondary | ICD-10-CM | POA: Diagnosis not present

## 2018-03-20 DIAGNOSIS — E291 Testicular hypofunction: Secondary | ICD-10-CM | POA: Diagnosis not present

## 2018-03-21 ENCOUNTER — Encounter (INDEPENDENT_AMBULATORY_CARE_PROVIDER_SITE_OTHER): Payer: Self-pay

## 2018-03-22 DIAGNOSIS — R000 Tachycardia, unspecified: Secondary | ICD-10-CM | POA: Diagnosis not present

## 2018-03-22 DIAGNOSIS — I482 Chronic atrial fibrillation, unspecified: Secondary | ICD-10-CM | POA: Diagnosis not present

## 2018-03-22 DIAGNOSIS — E782 Mixed hyperlipidemia: Secondary | ICD-10-CM | POA: Diagnosis not present

## 2018-03-22 DIAGNOSIS — I2510 Atherosclerotic heart disease of native coronary artery without angina pectoris: Secondary | ICD-10-CM | POA: Diagnosis not present

## 2018-03-22 DIAGNOSIS — I4892 Unspecified atrial flutter: Secondary | ICD-10-CM | POA: Diagnosis not present

## 2018-03-22 DIAGNOSIS — Z950 Presence of cardiac pacemaker: Secondary | ICD-10-CM | POA: Diagnosis not present

## 2018-03-22 DIAGNOSIS — E7800 Pure hypercholesterolemia, unspecified: Secondary | ICD-10-CM | POA: Diagnosis not present

## 2018-03-22 DIAGNOSIS — I480 Paroxysmal atrial fibrillation: Secondary | ICD-10-CM | POA: Diagnosis not present

## 2018-03-22 DIAGNOSIS — I10 Essential (primary) hypertension: Secondary | ICD-10-CM | POA: Diagnosis not present

## 2018-03-22 DIAGNOSIS — R0609 Other forms of dyspnea: Secondary | ICD-10-CM | POA: Diagnosis not present

## 2018-03-22 DIAGNOSIS — I4891 Unspecified atrial fibrillation: Secondary | ICD-10-CM | POA: Diagnosis not present

## 2018-03-26 ENCOUNTER — Other Ambulatory Visit (INDEPENDENT_AMBULATORY_CARE_PROVIDER_SITE_OTHER): Payer: Self-pay | Admitting: Nurse Practitioner

## 2018-03-31 ENCOUNTER — Telehealth (INDEPENDENT_AMBULATORY_CARE_PROVIDER_SITE_OTHER): Payer: Self-pay

## 2018-03-31 DIAGNOSIS — T82590A Other mechanical complication of surgically created arteriovenous fistula, initial encounter: Secondary | ICD-10-CM | POA: Diagnosis not present

## 2018-03-31 DIAGNOSIS — Z992 Dependence on renal dialysis: Secondary | ICD-10-CM | POA: Diagnosis not present

## 2018-03-31 DIAGNOSIS — N186 End stage renal disease: Secondary | ICD-10-CM | POA: Diagnosis not present

## 2018-03-31 DIAGNOSIS — T82898A Other specified complication of vascular prosthetic devices, implants and grafts, initial encounter: Secondary | ICD-10-CM | POA: Diagnosis not present

## 2018-03-31 NOTE — Telephone Encounter (Signed)
Per the dialysis center the patient's permcath removal is to be canceled.

## 2018-04-03 ENCOUNTER — Encounter: Admission: RE | Payer: Self-pay | Source: Home / Self Care

## 2018-04-03 ENCOUNTER — Ambulatory Visit: Admission: RE | Admit: 2018-04-03 | Payer: PPO | Source: Home / Self Care | Admitting: Vascular Surgery

## 2018-04-03 DIAGNOSIS — Z992 Dependence on renal dialysis: Secondary | ICD-10-CM | POA: Diagnosis not present

## 2018-04-03 SURGERY — DIALYSIS/PERMA CATHETER REMOVAL
Anesthesia: LOCAL

## 2018-04-11 DIAGNOSIS — Z992 Dependence on renal dialysis: Secondary | ICD-10-CM | POA: Diagnosis not present

## 2018-04-11 DIAGNOSIS — N186 End stage renal disease: Secondary | ICD-10-CM | POA: Diagnosis not present

## 2018-04-12 DIAGNOSIS — D631 Anemia in chronic kidney disease: Secondary | ICD-10-CM | POA: Diagnosis not present

## 2018-04-12 DIAGNOSIS — D509 Iron deficiency anemia, unspecified: Secondary | ICD-10-CM | POA: Diagnosis not present

## 2018-04-12 DIAGNOSIS — Z992 Dependence on renal dialysis: Secondary | ICD-10-CM | POA: Diagnosis not present

## 2018-04-12 DIAGNOSIS — N2581 Secondary hyperparathyroidism of renal origin: Secondary | ICD-10-CM | POA: Diagnosis not present

## 2018-04-12 DIAGNOSIS — N186 End stage renal disease: Secondary | ICD-10-CM | POA: Diagnosis not present

## 2018-04-20 DIAGNOSIS — H6121 Impacted cerumen, right ear: Secondary | ICD-10-CM | POA: Diagnosis not present

## 2018-04-20 DIAGNOSIS — H93292 Other abnormal auditory perceptions, left ear: Secondary | ICD-10-CM | POA: Diagnosis not present

## 2018-04-20 DIAGNOSIS — H6980 Other specified disorders of Eustachian tube, unspecified ear: Secondary | ICD-10-CM | POA: Diagnosis not present

## 2018-04-20 DIAGNOSIS — H6982 Other specified disorders of Eustachian tube, left ear: Secondary | ICD-10-CM | POA: Diagnosis not present

## 2018-04-20 DIAGNOSIS — J301 Allergic rhinitis due to pollen: Secondary | ICD-10-CM | POA: Diagnosis not present

## 2018-04-26 ENCOUNTER — Encounter: Payer: Self-pay | Admitting: *Deleted

## 2018-05-02 ENCOUNTER — Emergency Department: Payer: PPO

## 2018-05-02 ENCOUNTER — Encounter (INDEPENDENT_AMBULATORY_CARE_PROVIDER_SITE_OTHER): Payer: Self-pay

## 2018-05-02 ENCOUNTER — Other Ambulatory Visit: Payer: Self-pay

## 2018-05-02 ENCOUNTER — Emergency Department
Admission: EM | Admit: 2018-05-02 | Discharge: 2018-05-02 | Disposition: A | Discharge status: Home / Self Care | Payer: PPO | Attending: Emergency Medicine | Admitting: Emergency Medicine

## 2018-05-02 DIAGNOSIS — F17210 Nicotine dependence, cigarettes, uncomplicated: Secondary | ICD-10-CM | POA: Insufficient documentation

## 2018-05-02 DIAGNOSIS — E291 Testicular hypofunction: Secondary | ICD-10-CM | POA: Diagnosis not present

## 2018-05-02 DIAGNOSIS — Y9259 Other trade areas as the place of occurrence of the external cause: Secondary | ICD-10-CM | POA: Insufficient documentation

## 2018-05-02 DIAGNOSIS — S76309A Unspecified injury of muscle, fascia and tendon of the posterior muscle group at thigh level, unspecified thigh, initial encounter: Secondary | ICD-10-CM | POA: Diagnosis not present

## 2018-05-02 DIAGNOSIS — Z7982 Long term (current) use of aspirin: Secondary | ICD-10-CM | POA: Insufficient documentation

## 2018-05-02 DIAGNOSIS — Z79899 Other long term (current) drug therapy: Secondary | ICD-10-CM | POA: Insufficient documentation

## 2018-05-02 DIAGNOSIS — E1122 Type 2 diabetes mellitus with diabetic chronic kidney disease: Secondary | ICD-10-CM | POA: Diagnosis not present

## 2018-05-02 DIAGNOSIS — Z7984 Long term (current) use of oral hypoglycemic drugs: Secondary | ICD-10-CM | POA: Diagnosis not present

## 2018-05-02 DIAGNOSIS — Y939 Activity, unspecified: Secondary | ICD-10-CM | POA: Diagnosis not present

## 2018-05-02 DIAGNOSIS — S83249A Other tear of medial meniscus, current injury, unspecified knee, initial encounter: Secondary | ICD-10-CM

## 2018-05-02 DIAGNOSIS — S76319A Strain of muscle, fascia and tendon of the posterior muscle group at thigh level, unspecified thigh, initial encounter: Secondary | ICD-10-CM

## 2018-05-02 DIAGNOSIS — J45909 Unspecified asthma, uncomplicated: Secondary | ICD-10-CM | POA: Insufficient documentation

## 2018-05-02 DIAGNOSIS — I120 Hypertensive chronic kidney disease with stage 5 chronic kidney disease or end stage renal disease: Secondary | ICD-10-CM | POA: Diagnosis not present

## 2018-05-02 DIAGNOSIS — Z992 Dependence on renal dialysis: Secondary | ICD-10-CM | POA: Insufficient documentation

## 2018-05-02 DIAGNOSIS — Y999 Unspecified external cause status: Secondary | ICD-10-CM | POA: Diagnosis not present

## 2018-05-02 DIAGNOSIS — X509XXA Other and unspecified overexertion or strenuous movements or postures, initial encounter: Secondary | ICD-10-CM | POA: Diagnosis not present

## 2018-05-02 DIAGNOSIS — N186 End stage renal disease: Secondary | ICD-10-CM | POA: Insufficient documentation

## 2018-05-02 DIAGNOSIS — S81019A Laceration without foreign body, unspecified knee, initial encounter: Secondary | ICD-10-CM | POA: Diagnosis not present

## 2018-05-02 DIAGNOSIS — S79929A Unspecified injury of unspecified thigh, initial encounter: Secondary | ICD-10-CM | POA: Diagnosis present

## 2018-05-02 DIAGNOSIS — M7121 Synovial cyst of popliteal space [Baker], right knee: Secondary | ICD-10-CM | POA: Diagnosis not present

## 2018-05-02 DIAGNOSIS — N5201 Erectile dysfunction due to arterial insufficiency: Secondary | ICD-10-CM | POA: Diagnosis not present

## 2018-05-02 LAB — GLUCOSE, CAPILLARY: Glucose-Capillary: 106 mg/dL — ABNORMAL HIGH (ref 70–99)

## 2018-05-02 MED ORDER — OXYCODONE-ACETAMINOPHEN 5-325 MG PO TABS
1.0000 | ORAL_TABLET | Freq: Once | ORAL | Status: AC
  Administered 2018-05-02: 1 via ORAL
  Filled 2018-05-02: qty 1

## 2018-05-02 MED ORDER — OXYCODONE-ACETAMINOPHEN 5-325 MG PO TABS: 1.0000 | ORAL_TABLET | ORAL | 0 refills | Status: DC | PRN

## 2018-05-02 NOTE — ED Notes (Signed)
MD at bedside. 

## 2018-05-02 NOTE — ED Notes (Signed)
Pt returned from MRI °

## 2018-05-02 NOTE — ED Triage Notes (Signed)
Pt comes via POV from home with c/o right ankle, bilateral legs and hip area lower on both sides.  Pt states he went to [employer] and it started today. Pt not sure if it was from all the walking. Pt states he feels like he was beat with a stick.  Pt is dialysis pt. Pt states M,W,F treatments. Pt states he went to dialysis yesterday and was little sore, but hurts more today.

## 2018-05-02 NOTE — ED Provider Notes (Signed)
[facility] Emergency Department Provider Note   First MD Initiated Contact with Patient 05/02/18 1539     (approximate)  I have reviewed the triage vital signs and the nursing notes.   HISTORY  Chief Complaint Leg Pain and Ankle Pain   HPI Donald Fernandez is a 48 y.o. male below list of chronic medical conditions presents to the emergency department with bilateral knee and posterior thigh pain which the patient states began while he was working at [employer] on Sunday.  Patient states he felt a pop in his posterior upper thigh.  Patient states it felt like someone hit me with a bat".  Patient states pain is progressively worsened since that time with decreased ability to ambulate as result.  Patient states current pain score is 10 out of 10 worse with ambulation.   Past Medical History:  Diagnosis Date  . Arthritis   . Asthma    triggered by enviornmental allergies  . BPH (benign prostatic hyperplasia)   . Diabetes mellitus without complication ([place])   . Dialysis patient [facility])    dialysis T-TH-SAT  . GERD (gastroesophageal reflux disease)   . Gout   . Hyperlipidemia   . Hypertension   . Neuropathy   . Renal disorder    CKD4; end stage renal disease  . Restless legs   . Sleep apnea    uses cpap    Patient Active Problem List   Diagnosis Date Noted  . Complication of vascular access for dialysis 10/31/2017  . ESRD on dialysis ([place]) 07/27/2017  . Angioedema 06/05/2017  . Chronic kidney disease 01/26/2017  . Pseudotumor cerebri 12/17/2016  . Essential hypertension 08/02/2016  . Diabetes mellitus type 2 in obese ([place]) 08/02/2016  . Acute on chronic renal failure ([place]) 07/09/2016    Past Surgical History:  Procedure Laterality Date  . A/V FISTULAGRAM N/A 06/08/2017   Procedure: A/V FISTULAGRAM;  Surgeon: Algernon Huxley, MD;  Location: [place] [facility];  Service: Cardiovascular;  Laterality: N/A;  . ANKLE SURGERY Right    fracture  repair with pins, plates, screws  . AV FISTULA INSERTION W/ RF MAGNETIC GUIDANCE Left 09/02/2017   Procedure: AV FISTULA INSERTION W/RF MAGNETIC GUIDANCE;  Surgeon: Katha Cabal, MD;  Location: [place] [facility];  Service: Cardiovascular;  Laterality: Left;  . AV FISTULA PLACEMENT Left 08/04/2016   Procedure: ARTERIOVENOUS (AV) FISTULA CREATION ( RADIOCEPHALIC );  Surgeon: Katha Cabal, MD;  Location: [facility] ORS;  Service: Vascular;  Laterality: Left;  . DIALYSIS/PERMA CATHETER INSERTION N/A 06/06/2017   Procedure: DIALYSIS/PERMA CATHETER INSERTION;  Surgeon: Algernon Huxley, MD;  Location: [place] [facility];  Service: Cardiovascular;  Laterality: N/A;  . IR ANGIO INTRA EXTRACRAN SEL INTERNAL CAROTID UNI R MOD SED  12/17/2016  . RADIOLOGY WITH ANESTHESIA N/A 12/17/2016   Procedure: Arteriogram/Venogram with right venous sinus manometry;  Surgeon: Consuella Lose, MD;  Location: [place];  Service: Radiology;  Laterality: N/A;  . REVISON OF ARTERIOVENOUS FISTULA Left 01/04/2018   Procedure: REVISON OF ARTERIOVENOUS FISTULA ( BRACHIALCEPHALIC );  Surgeon: Katha Cabal, MD;  Location: [facility] OR[facility];  Service: Vascular;  Laterality: Left;  . UPPER EXTREMITY ANGIOGRAPHY Left 10/19/2017   Procedure: UPPER EXTREMITY ANGIOGRAPHY;  Surgeon: Katha Cabal, MD;  Location: [place] [facility];  Service: Cardiovascular;  Laterality: Left;    Prior to Admission medications   Medication Sig Start Date End Date Taking? Authorizing Provider  allopurinol (ZYLOPRIM) 100 MG tablet Take  100 mg by mouth daily.    [provider]  amLODipine (NORVASC) 10 MG tablet Take 10 mg by mouth daily.    [provider]  aspirin EC 81 MG tablet Take 81 mg by mouth daily.    [provider]  atorvastatin (LIPITOR) 20 MG tablet Take 20 mg by mouth daily.     [provider]  calcium acetate (PHOSLO) 667 MG capsule TAKE 2 CAPSULES BY MOUTH WITH MEALS AND 1 WITH SNACKS 01/26/18    [provider]  carvedilol (COREG) 3.125 MG tablet Take 3.125 mg by mouth daily. 02/16/18   [provider]  Cholecalciferol 1000 units tablet Take 1,000 Units by mouth daily.     [provider]  cloNIDine (CATAPRES) 0.1 MG tablet Take 1 tablet (0.1 mg total) by mouth 3 (three) times daily. 07/11/16   [name], MD  doxazosin (CARDURA) 8 MG tablet Take 8 mg by mouth at bedtime.    [provider]  fluticasone (FLONASE) 50 MCG/ACT nasal spray Place 1 spray into both nostrils daily as needed for allergies or rhinitis.    [provider]  furosemide (LASIX) 40 MG tablet Take 40 mg by mouth daily.     [provider]  gabapentin (NEURONTIN) 100 MG capsule Take 1 capsule (100 mg total) by mouth 3 (three) times daily. 07/11/16   [name], MD  glipiZIDE (GLUCOTROL XL) 2.5 MG 24 hr tablet Take 2.5 mg by mouth daily. 12/08/17   [provider]  hydrALAZINE (APRESOLINE) 100 MG tablet Take 100 mg by mouth 3 (three) times daily.  08/16/16   [provider]  HYDROcodone-acetaminophen (NORCO) 5-325 MG tablet Take 1-2 tablets by mouth every 6 (six) hours as needed. 01/04/18   Schnier, Dolores Lory, MD  oxyCODONE-acetaminophen (PERCOCET) 5-325 MG tablet Take 1 tablet by mouth every 4 (four) hours as needed. 05/02/18 05/02/19  Gregor Hams, MD  tamsulosin (FLOMAX) 0.4 MG CAPS capsule Take 0.8 mg by mouth daily after breakfast.     [provider]  Testosterone (ANDROGEL) 20.25 MG/1.25GM (1.62%) GEL Place 4 Pump onto the skin daily.    [provider]  topiramate (TOPAMAX) 25 MG tablet Take 50 mg by mouth at bedtime.    [provider]  traZODone (DESYREL) 100 MG tablet Take 100 mg by mouth at bedtime.    [provider]  vitamin E 400 UNIT capsule Take 400 Units by mouth daily.    [provider]    Allergies Accupril [quinapril hcl] and Ace inhibitors  Family History  Problem  Relation Age of Onset  . AAA (abdominal aortic aneurysm) Mother   . Diabetes Mother   . Heart disease Father     Social History Social History   Tobacco Use  . Smoking status: Current Every Day Smoker    Types: Cigarettes  . Smokeless tobacco: Never Used  . Tobacco comment: APPROX. 1 PK/WEEK   Substance Use Topics  . Alcohol use: Yes    Comment: weekends  . Drug use: Yes    Types: Marijuana    Comment: LAST TIME APPROX. 08/03/17    Review of Systems Constitutional: No fever/chills Eyes: No visual changes. ENT: No sore throat. Cardiovascular: Denies chest pain. Respiratory: Denies shortness of breath. Gastrointestinal: No abdominal pain.  No nausea, no vomiting.  No diarrhea.  No constipation. Genitourinary: Negative for dysuria. Musculoskeletal: Bilateral knee and posterior thigh pain Integumentary: Negative for rash. Neurological: Negative for headaches, focal weakness or  numbness.   ____________________________________________   PHYSICAL EXAM:  VITAL SIGNS: ED Triage Vitals [05/02/18 1410]  Enc Vitals Group     BP (!) 130/110     Pulse Rate (!) 108     Resp 18     Temp 98.5 F (36.9 C)     Temp Source Oral     SpO2 100 %     Weight      Height      Head Circumference      Peak Flow      Pain Score 10     Pain Loc      Pain Edu?      Excl. in [place]?     Constitutional: Alert and oriented.  Apparent discomfort  eyes: Conjunctivae are normal.  Mouth/Throat: Mucous membranes are moist.  Oropharynx non-erythematous Neck: No stridor. Cardiovascular: Normal rate, regular rhythm. Good peripheral circulation. Grossly normal heart sounds. Respiratory: Normal respiratory effort.  No retractions. Lungs CTAB. Gastrointestinal: Soft and nontender. No distention.  Musculoskeletal: Pain with bilateral knee palpation.  Pain with active and passive range of motion of the knee.  Pain with valgus varus stress test.  Pain to palpation of the hamstrings  bilaterally. Neurologic:  Normal speech and language. No gross focal neurologic deficits are appreciated.  Skin:  Skin is warm, dry and intact. No rash noted. Psychiatric: Mood and affect are normal. Speech and behavior are normal.  ____________________________________________   LABS (all labs ordered are listed, but only abnormal results are displayed)  Labs Reviewed  GLUCOSE, CAPILLARY - Abnormal; Notable for the following components:      Result Value   Glucose-Capillary 106 (*)    All other components within normal limits    RADIOLOGY I, [place] Sydnee Lamour, personally viewed and evaluated these images (plain radiographs) as part of my medical decision making, as well as reviewing the written report by the radiologist.  ED MD interpretation: Notable degree of degenerative subcortical cystic lesions in both knees suspicion for possible right and left meniscal tears concern for athletic pubalgia on the left with a suspected small tear of the left abductor aponeurosis concern for bilateral sacroiliitis edema in the issue will adjacent to the hamstring origin sites but without any well-defined hamstring tear per disc on MRI interpretation  Official radiology report(s): Mr Frmur Right Wo Contrast  Result Date: 05/02/2018 CLINICAL DATA:  Bilateral upper leg pain after walking today. EXAM: MR OF THE LEFT FEMUR WITHOUT CONTRAST; MRI OF THE RIGHT FEMUR WITHOUT CONTRAST TECHNIQUE: Multiplanar, multisequence MR imaging of the left femur/upper leg was performed. No intravenous contrast was administered. Multiplanar, multisequence MR imaging of the right femur/upper leg was performed. No intravenous contrast was administered. COMPARISON:  None. FINDINGS: LEFT FEMUR/UPPER LEG: Bones/Joint/Cartilage No appreciable fracture. There is some subcortical marrow edema posterolaterally in the proximal tibia adjacent to what appears to be an osteochondral lesion. Separate degenerative subcortical cystic lesion of  the anteromedial left tibia with faint adjacent marrow edema. Smaller similar lesions laterally in the femoral trochlear groove on the left. On the top most axial images, there appears to be abnormal edema in both the sacral ala and in the adjacent iliac bones. Subtle marrow edema in the ischial tuberosities bilaterally adjacent to the hamstring origination sites. Edema signal in the pubic symphysis with endosteal edema in the left pubis without appearance which could reflect athletic pubalgia on the left, for example on image 14/24. Ligaments N/A Muscles and Tendons No significant abnormal muscular edema or tendinous discontinuity  in the left thigh. Soft tissues Today's exam should not be considered accurate in assessment of the menisci, but the appearance on image 20/21 raises suspicion for a tear of the posterior horn medial meniscus. RIGHT FEMUR/UPPER LEG: Bones/Joint/Cartilage As on the contralateral side, there is abnormal edema along the sacroiliac joint favoring sacroiliitis. Subtle marrow edema in knee ischium adjacent to the origination sites of the hamstring tendons. Degenerative subcortical lesions are present along the left lateral femoral trochlear groove, posterolaterally in the medial tibial plateau, and posteromedially along the medial femoral condyle. Marginal spurring noted. Ligaments N/A Muscles and Tendons No significant abnormal muscular edema. Soft tissues Small Baker's cyst. As on the contralateral side, parasagittal images raise suspicion of a tear of the posterior horn medial meniscus. IMPRESSION: 1. Notable degree of degenerative subcortical cystic lesions in both knees, with suspicion for tears of both the right and left medial meniscus posterior horn. Please note that today's exam was not optimized to assess the knees, and the positive predictive value of today's exam for meniscal lesions is adversely affected by the large field of view and lack of specific knee protocol. 2. Findings  around the pubis raise suspicion for athletic pubalgia on the left, with a suspected small tear of the left adductor aponeurosis. Today's exam was not optimized to assess for athletic pubalgia. 3. We partially image notable degree of edema along the sacroiliac joints, favoring bilateral sacroiliitis given the symmetry. This is only partially included on today's exam. 4. Low-level marrow edema in the ischia adjacent to the hamstring origination sites, but without a well-defined hamstring tear. 5. Small right Baker's cyst. Electronically Signed   By: Van Clines M.D.   On: 05/02/2018 19:29   Mr Femur Left Wo Contrast  Result Date: 05/02/2018 CLINICAL DATA:  Bilateral upper leg pain after walking today. EXAM: MR OF THE LEFT FEMUR WITHOUT CONTRAST; MRI OF THE RIGHT FEMUR WITHOUT CONTRAST TECHNIQUE: Multiplanar, multisequence MR imaging of the left femur/upper leg was performed. No intravenous contrast was administered. Multiplanar, multisequence MR imaging of the right femur/upper leg was performed. No intravenous contrast was administered. COMPARISON:  None. FINDINGS: LEFT FEMUR/UPPER LEG: Bones/Joint/Cartilage No appreciable fracture. There is some subcortical marrow edema posterolaterally in the proximal tibia adjacent to what appears to be an osteochondral lesion. Separate degenerative subcortical cystic lesion of the anteromedial left tibia with faint adjacent marrow edema. Smaller similar lesions laterally in the femoral trochlear groove on the left. On the top most axial images, there appears to be abnormal edema in both the sacral ala and in the adjacent iliac bones. Subtle marrow edema in the ischial tuberosities bilaterally adjacent to the hamstring origination sites. Edema signal in the pubic symphysis with endosteal edema in the left pubis without appearance which could reflect athletic pubalgia on the left, for example on image 14/24. Ligaments N/A Muscles and Tendons No significant abnormal  muscular edema or tendinous discontinuity in the left thigh. Soft tissues Today's exam should not be considered accurate in assessment of the menisci, but the appearance on image 20/21 raises suspicion for a tear of the posterior horn medial meniscus. RIGHT FEMUR/UPPER LEG: Bones/Joint/Cartilage As on the contralateral side, there is abnormal edema along the sacroiliac joint favoring sacroiliitis. Subtle marrow edema in knee ischium adjacent to the origination sites of the hamstring tendons. Degenerative subcortical lesions are present along the left lateral femoral trochlear groove, posterolaterally in the medial tibial plateau, and posteromedially along the medial femoral condyle. Marginal spurring noted. Ligaments N/A Muscles and Tendons  No significant abnormal muscular edema. Soft tissues Small Baker's cyst. As on the contralateral side, parasagittal images raise suspicion of a tear of the posterior horn medial meniscus. IMPRESSION: 1. Notable degree of degenerative subcortical cystic lesions in both knees, with suspicion for tears of both the right and left medial meniscus posterior horn. Please note that today's exam was not optimized to assess the knees, and the positive predictive value of today's exam for meniscal lesions is adversely affected by the large field of view and lack of specific knee protocol. 2. Findings around the pubis raise suspicion for athletic pubalgia on the left, with a suspected small tear of the left adductor aponeurosis. Today's exam was not optimized to assess for athletic pubalgia. 3. We partially image notable degree of edema along the sacroiliac joints, favoring bilateral sacroiliitis given the symmetry. This is only partially included on today's exam. 4. Low-level marrow edema in the ischia adjacent to the hamstring origination sites, but without a well-defined hamstring tear. 5. Small right Baker's cyst. Electronically Signed   By: Van Clines M.D.   On: 05/02/2018 19:29     ____________________________________________    Procedures   ____________________________________________   INITIAL IMPRESSION / ASSESSMENT AND PLAN / ED COURSE  As part of my medical decision making, I reviewed the following data within the electronic MEDICAL RECORD NUMBER   48 year old male presenting to the emergency department with above-stated history and physical exam concerning for possible hamstring tear versus strain.  Also concern for ligamentous versus meniscal injury to the knees.  As such MRI was performed which revealed above-stated findings.  Patient given Percocet and a walker for home with referral to orthopedic surgery for further outpatient evaluation and management  FINAL CLINICAL IMPRESSION(S) / ED DIAGNOSES  Final diagnoses:  Hamstring strain, unspecified laterality, initial encounter  Tear of medial meniscus of knee, unspecified laterality, unspecified tear type, unspecified whether old or current tear, initial encounter     MEDICATIONS GIVEN DURING THIS VISIT:  Medications  oxyCODONE-acetaminophen (PERCOCET/ROXICET) 5-325 MG per tablet 1 tablet (1 tablet Oral Given 05/02/18 1549)  oxyCODONE-acetaminophen (PERCOCET/ROXICET) 5-325 MG per tablet 1 tablet (1 tablet Oral Given 05/02/18 1957)     ED Discharge Orders         Ordered    oxyCODONE-acetaminophen (PERCOCET) 5-325 MG tablet  Every 4 hours PRN     05/02/18 2001           Note:  This document was prepared using Dragon voice recognition software and may include unintentional dictation errors.    Gregor Hams, MD 05/02/18 2141

## 2018-05-02 NOTE — ED Notes (Signed)
Patient transported to MRI 

## 2018-05-03 ENCOUNTER — Other Ambulatory Visit (INDEPENDENT_AMBULATORY_CARE_PROVIDER_SITE_OTHER): Payer: Self-pay | Admitting: Nurse Practitioner

## 2018-05-03 MED ORDER — CEFAZOLIN SODIUM-DEXTROSE 1-4 GM/50ML-% IV SOLN: 1.0000 g | Freq: Once | INTRAVENOUS | Status: DC

## 2018-05-04 ENCOUNTER — Other Ambulatory Visit: Payer: Self-pay

## 2018-05-04 ENCOUNTER — Encounter
Admission: RE | Disposition: A | Discharge status: Home / Self Care | Payer: Self-pay | Source: Home / Self Care | Attending: Vascular Surgery

## 2018-05-04 ENCOUNTER — Ambulatory Visit
Admission: RE | Admit: 2018-05-04 | Discharge: 2018-05-04 | Disposition: A | Discharge status: Home / Self Care | Payer: PPO | Attending: Vascular Surgery | Admitting: Vascular Surgery

## 2018-05-04 DIAGNOSIS — G4730 Sleep apnea, unspecified: Secondary | ICD-10-CM | POA: Diagnosis not present

## 2018-05-04 DIAGNOSIS — G2581 Restless legs syndrome: Secondary | ICD-10-CM | POA: Diagnosis not present

## 2018-05-04 DIAGNOSIS — E1140 Type 2 diabetes mellitus with diabetic neuropathy, unspecified: Secondary | ICD-10-CM | POA: Insufficient documentation

## 2018-05-04 DIAGNOSIS — Z992 Dependence on renal dialysis: Secondary | ICD-10-CM | POA: Diagnosis not present

## 2018-05-04 DIAGNOSIS — N186 End stage renal disease: Secondary | ICD-10-CM | POA: Diagnosis not present

## 2018-05-04 DIAGNOSIS — N400 Enlarged prostate without lower urinary tract symptoms: Secondary | ICD-10-CM | POA: Diagnosis not present

## 2018-05-04 DIAGNOSIS — I120 Hypertensive chronic kidney disease with stage 5 chronic kidney disease or end stage renal disease: Secondary | ICD-10-CM | POA: Insufficient documentation

## 2018-05-04 DIAGNOSIS — E785 Hyperlipidemia, unspecified: Secondary | ICD-10-CM | POA: Insufficient documentation

## 2018-05-04 DIAGNOSIS — J45909 Unspecified asthma, uncomplicated: Secondary | ICD-10-CM | POA: Diagnosis not present

## 2018-05-04 DIAGNOSIS — Z4901 Encounter for fitting and adjustment of extracorporeal dialysis catheter: Secondary | ICD-10-CM | POA: Diagnosis not present

## 2018-05-04 DIAGNOSIS — Z7984 Long term (current) use of oral hypoglycemic drugs: Secondary | ICD-10-CM | POA: Insufficient documentation

## 2018-05-04 DIAGNOSIS — E1122 Type 2 diabetes mellitus with diabetic chronic kidney disease: Secondary | ICD-10-CM | POA: Insufficient documentation

## 2018-05-04 DIAGNOSIS — M109 Gout, unspecified: Secondary | ICD-10-CM | POA: Insufficient documentation

## 2018-05-04 DIAGNOSIS — K219 Gastro-esophageal reflux disease without esophagitis: Secondary | ICD-10-CM | POA: Insufficient documentation

## 2018-05-04 DIAGNOSIS — Z79899 Other long term (current) drug therapy: Secondary | ICD-10-CM | POA: Diagnosis not present

## 2018-05-04 DIAGNOSIS — F17210 Nicotine dependence, cigarettes, uncomplicated: Secondary | ICD-10-CM | POA: Insufficient documentation

## 2018-05-04 HISTORY — PX: DIALYSIS/PERMA CATHETER REMOVAL: CATH118289

## 2018-05-04 LAB — GLUCOSE, CAPILLARY: Glucose-Capillary: 106 mg/dL — ABNORMAL HIGH (ref 70–99)

## 2018-05-04 SURGERY — DIALYSIS/PERMA CATHETER REMOVAL
Anesthesia: LOCAL

## 2018-05-04 MED ORDER — HYDROMORPHONE HCL 1 MG/ML IJ SOLN: 1.0000 mg | Freq: Once | INTRAMUSCULAR | Status: DC | PRN

## 2018-05-04 MED ORDER — LIDOCAINE-EPINEPHRINE (PF) 1 %-1:200000 IJ SOLN
INTRAMUSCULAR | Status: DC | PRN
  Administered 2018-05-04: 20 mL via INTRADERMAL

## 2018-05-04 MED ORDER — SODIUM CHLORIDE 0.9 % IV SOLN: INTRAVENOUS | Status: DC

## 2018-05-04 MED ORDER — LIDOCAINE-EPINEPHRINE (PF) 1 %-1:200000 IJ SOLN
INTRAMUSCULAR | Status: AC
  Filled 2018-05-04: qty 30

## 2018-05-04 MED ORDER — ONDANSETRON HCL 4 MG/2ML IJ SOLN: 4.0000 mg | Freq: Four times a day (QID) | INTRAMUSCULAR | Status: DC | PRN

## 2018-05-04 SURGICAL SUPPLY — 2 items
FORCEPS HALSTEAD CVD 5IN STRL (INSTRUMENTS) ×2 IMPLANT
TRAY LACERAT/PLASTIC (MISCELLANEOUS) ×2 IMPLANT

## 2018-05-04 NOTE — Patient Outreach (Signed)
[facility]) Care Management  05/04/2018  Donald Fernandez Feb 08, 1971 [id number]   Telephone Screen  Referral Date: 05/04/2018 Referral Source: [facility] Dept. Referral Reason: " member needs transportation assistance to appts in [place] from [place], mom pays for his meds but he has to pay her back" Insurance: [employer]   Outreach attempt # 1 to patient. Spoke with patient and screening completed.   Social: Patient resides in the home along with his mother. He voices that he is independent with ADLs/IADLs. No recent falls. He is using walker as needed. Patient reports that vehicle is malfunctioning and he is unable to afford to get it fixed. Patient uses ACT services to get around to appts locally including dialysis on M,W,F. However, he voices that transportation agency will not take him out of town to appts in [place] and [place] were he sees specialists. He voices that he tried applying for Medicaid some time ago but was told income was too high. Patient inquiring about assistance with co-pays to MDs and hospital bills. Advised patient that [facility] unable to assist with this matter and encouraged him to consider applying for financial hardship application. Patient reported that he got a form today while at hospital having outpatient procedure and will complete form.    Conditions: Per chart review, patient has PMH of, but not limited to: ESRD, A-fib, sleep apnea, BPH, DM, Asthma, HTN and HLD. He voices that he is monitoring cbgs in the home. Patient reported blood sugar this morning was 106. Last A1C on file 5.1(Feb 2019). Patient states that he will be evaluated soon for possible knee surgery. Patient denies needing any further nursing assistance in managing and caring for chronic conditions.   Medications: Patient voices taking about 20 meds. RN CM asked patient multiple times if he was having any issues affording meds. He voiced no all times. He reports he is able  to get his meds just fine with no issues. RN CM offered pharmacy referral for polypharmacy med review but patient declined. He states that his meds are reviewed all the time by his MDs and at dialysis center and he is taking what he should be taking.   Appointments: Patient followed by PCP and voices he saw about 2-3 weeks ago. He reports he sees specialists both in [place] and [place].   Consent:[facility] services reviewed and discussed with patient. Verbal consent for services given. Patient agreeable to   Plan: RN CM will send [facility] SW referral for possible assistance with resources related to transportation    Enzo Montgomery, [facility] Management Coordinator Direct Phone: [phone] Toll Free: [phone] Fax: [phone]

## 2018-05-04 NOTE — H&P (Signed)
[facility] Admission History & Physical  MRN : [id number]  Donald Fernandez is a 48 y.o. (05/24/1970) male who presents with chief complaint of No chief complaint on file. Marland Kitchen  History of Present Illness: I am asked to evaluate the patient by the dialysis center. The patient was sent here because they have a nonfunctioning tunneled catheter and a functioning AVF.  The patient reports they're not been any problems with any of their dialysis runs. They are reporting good flows with good parameters at dialysis.  Patient denies pain or tenderness overlying the access.  There is no pain with dialysis.  The patient denies hand pain or finger pain consistent with steal syndrome.  No fevers or chills while on dialysis.   Current Facility-Administered Medications  Medication Dose Route Frequency Provider Last Rate Last Dose  . ceFAZolin (ANCEF) IVPB 1 g/50 mL premix  1 g Intravenous Once Kris Hartmann, NP        Past Medical History:  Diagnosis Date  . Arthritis   . Asthma    triggered by enviornmental allergies  . BPH (benign prostatic hyperplasia)   . Diabetes mellitus without complication ([place])   . Dialysis patient [facility])    dialysis T-TH-SAT  . GERD (gastroesophageal reflux disease)   . Gout   . Hyperlipidemia   . Hypertension   . Neuropathy   . Renal disorder    CKD4; end stage renal disease  . Restless legs   . Sleep apnea    uses cpap    Past Surgical History:  Procedure Laterality Date  . A/V FISTULAGRAM N/A 06/08/2017   Procedure: A/V FISTULAGRAM;  Surgeon: Algernon Huxley, MD;  Location: [place] [facility] LAB;  Service: Cardiovascular;  Laterality: N/A;  . ANKLE SURGERY Right    fracture repair with pins, plates, screws  . AV FISTULA INSERTION W/ RF MAGNETIC GUIDANCE Left 09/02/2017   Procedure: AV FISTULA INSERTION W/RF MAGNETIC GUIDANCE;  Surgeon: Katha Cabal, MD;  Location: [place] CV LAB;  Service: Cardiovascular;  Laterality: Left;   . AV FISTULA PLACEMENT Left 08/04/2016   Procedure: ARTERIOVENOUS (AV) FISTULA CREATION ( RADIOCEPHALIC );  Surgeon: Katha Cabal, MD;  Location: [facility] ORS;  Service: Vascular;  Laterality: Left;  . DIALYSIS/PERMA CATHETER INSERTION N/A 06/06/2017   Procedure: DIALYSIS/PERMA CATHETER INSERTION;  Surgeon: Algernon Huxley, MD;  Location: [place] CV LAB;  Service: Cardiovascular;  Laterality: N/A;  . IR ANGIO INTRA EXTRACRAN SEL INTERNAL CAROTID UNI R MOD SED  12/17/2016  . RADIOLOGY WITH ANESTHESIA N/A 12/17/2016   Procedure: Arteriogram/Venogram with right venous sinus manometry;  Surgeon: Consuella Lose, MD;  Location: [place];  Service: Radiology;  Laterality: N/A;  . REVISON OF ARTERIOVENOUS FISTULA Left 01/04/2018   Procedure: REVISON OF ARTERIOVENOUS FISTULA ( BRACHIALCEPHALIC );  Surgeon: Katha Cabal, MD;  Location: [facility] ORS;  Service: Vascular;  Laterality: Left;  . UPPER EXTREMITY ANGIOGRAPHY Left 10/19/2017   Procedure: UPPER EXTREMITY ANGIOGRAPHY;  Surgeon: Katha Cabal, MD;  Location: [place] CV LAB;  Service: Cardiovascular;  Laterality: Left;    Social History Social History   Tobacco Use  . Smoking status: Current Every Day Smoker    Types: Cigarettes  . Smokeless tobacco: Never Used  . Tobacco comment: APPROX. 1 PK/WEEK   Substance Use Topics  . Alcohol use: Yes    Comment: weekends  . Drug use: Yes    Types: Marijuana    Comment: LAST TIME APPROX. 08/03/17  Family History Family History  Problem Relation Age of Onset  . AAA (abdominal aortic aneurysm) Mother   . Diabetes Mother   . Heart disease Father     No family history of bleeding or clotting disorders, autoimmune disease or porphyria  Allergies  Allergen Reactions  . Accupril [Quinapril Hcl] Anaphylaxis    Angioedema  . Ace Inhibitors Anaphylaxis    Angioedema     REVIEW OF SYSTEMS (Negative unless checked)  Constitutional: [] Weight loss  [] Fever  [] Chills Cardiac: [] Chest  pain   [] Chest pressure   [] Palpitations   [] Shortness of breath when laying flat   [] Shortness of breath at rest   [x] Shortness of breath with exertion. Vascular:  [] Pain in legs with walking   [] Pain in legs at rest   [] Pain in legs when laying flat   [] Claudication   [] Pain in feet when walking  [] Pain in feet at rest  [] Pain in feet when laying flat   [] History of DVT   [] Phlebitis   [] Swelling in legs   [] Varicose veins   [] Non-healing ulcers Pulmonary:   [] Uses home oxygen   [] Productive cough   [] Hemoptysis   [] Wheeze  [] COPD   [] Asthma Neurologic:  [] Dizziness  [] Blackouts   [] Seizures   [] History of stroke   [] History of TIA  [] Aphasia   [] Temporary blindness   [] Dysphagia   [] Weakness or numbness in arms   [] Weakness or numbness in legs Musculoskeletal:  [x] Arthritis   [] Joint swelling   [] Joint pain   [] Low back pain Hematologic:  [] Easy bruising  [] Easy bleeding   [] Hypercoagulable state   [] Anemic  [] Hepatitis Gastrointestinal:  [] Blood in stool   [] Vomiting blood  [] Gastroesophageal reflux/heartburn   [] Difficulty swallowing. Genitourinary:  [x] Chronic kidney disease   [] Difficult urination  [] Frequent urination  [] Burning with urination   [] Blood in urine Skin:  [] Rashes   [] Ulcers   [] Wounds Psychological:  [] History of anxiety   []  History of major depression.  Physical Examination  There were no vitals filed for this visit. There is no height or weight on file to calculate BMI. Gen: WD/WN, NAD Head: [place]/AT, No temporalis wasting.  Ear/Nose/Throat: Hearing grossly intact, nares w/o erythema or drainage, oropharynx w/o Erythema/Exudate,  Eyes: Conjunctiva clear, sclera non-icteric Neck: Trachea midline.  No JVD.  Pulmonary:  Good air movement, respirations not labored, no use of accessory muscles.  Cardiac: RRR, normal S1, S2. Vascular: good thrill in left arm AVF Vessel Right Left  Radial Palpable Palpable   Musculoskeletal: M/S 5/5 throughout.  Extremities without ischemic  changes.  No deformity or atrophy.  Neurologic: Sensation grossly intact in extremities.  Symmetrical.  Speech is fluent. Motor exam as listed above. Psychiatric: Judgment intact, Mood & affect appropriate for pt's clinical situation. Dermatologic: No rashes or ulcers noted.  No cellulitis or open wounds.    CBC Lab Results  Component Value Date   WBC 7.9 12/14/2017   HGB 12.6 (L) 01/04/2018   HCT 37.0 (L) 01/04/2018   MCV 93.5 12/14/2017   PLT 247 12/14/2017    BMET    Component Value Date/Time   NA 140 01/04/2018 0628   NA 145 12/31/2011 1850   K 3.5 01/04/2018 0628   K 3.5 12/31/2011 1850   CL 100 12/14/2017 1400   CL 108 (H) 12/31/2011 1850   CO2 22 12/14/2017 1400   CO2 28 12/31/2011 1850   GLUCOSE 97 01/04/2018 0628   GLUCOSE 63 (L) 12/31/2011 1850   BUN 52 (H) 12/14/2017  1400   BUN 14 12/31/2011 1850   CREATININE 12.01 (H) 12/14/2017 1400   CREATININE 1.72 (H) 12/31/2011 1850   CALCIUM 8.4 (L) 12/14/2017 1400   CALCIUM 8.1 (L) 06/06/2017 2038   GFRNONAA 4 (L) 12/14/2017 1400   GFRNONAA 48 (L) 12/31/2011 1850   GFRAA 5 (L) 12/14/2017 1400   GFRAA 55 (L) 12/31/2011 1850   CrCl cannot be calculated (Patient's most recent lab result is older than the maximum 21 days allowed.).  COAG Lab Results  Component Value Date   INR 0.88 12/14/2017   INR 0.90 09/02/2017   INR 0.91 08/08/2017    Radiology Mr Frmur Right Wo Contrast  Result Date: 05/02/2018 CLINICAL DATA:  Bilateral upper leg pain after walking today. EXAM: MR OF THE LEFT FEMUR WITHOUT CONTRAST; MRI OF THE RIGHT FEMUR WITHOUT CONTRAST TECHNIQUE: Multiplanar, multisequence MR imaging of the left femur/upper leg was performed. No intravenous contrast was administered. Multiplanar, multisequence MR imaging of the right femur/upper leg was performed. No intravenous contrast was administered. COMPARISON:  None. FINDINGS: LEFT FEMUR/UPPER LEG: Bones/Joint/Cartilage No appreciable fracture. There is some  subcortical marrow edema posterolaterally in the proximal tibia adjacent to what appears to be an osteochondral lesion. Separate degenerative subcortical cystic lesion of the anteromedial left tibia with faint adjacent marrow edema. Smaller similar lesions laterally in the femoral trochlear groove on the left. On the top most axial images, there appears to be abnormal edema in both the sacral ala and in the adjacent iliac bones. Subtle marrow edema in the ischial tuberosities bilaterally adjacent to the hamstring origination sites. Edema signal in the pubic symphysis with endosteal edema in the left pubis without appearance which could reflect athletic pubalgia on the left, for example on image 14/24. Ligaments N/A Muscles and Tendons No significant abnormal muscular edema or tendinous discontinuity in the left thigh. Soft tissues Today's exam should not be considered accurate in assessment of the menisci, but the appearance on image 20/21 raises suspicion for a tear of the posterior horn medial meniscus. RIGHT FEMUR/UPPER LEG: Bones/Joint/Cartilage As on the contralateral side, there is abnormal edema along the sacroiliac joint favoring sacroiliitis. Subtle marrow edema in knee ischium adjacent to the origination sites of the hamstring tendons. Degenerative subcortical lesions are present along the left lateral femoral trochlear groove, posterolaterally in the medial tibial plateau, and posteromedially along the medial femoral condyle. Marginal spurring noted. Ligaments N/A Muscles and Tendons No significant abnormal muscular edema. Soft tissues Small Baker's cyst. As on the contralateral side, parasagittal images raise suspicion of a tear of the posterior horn medial meniscus. IMPRESSION: 1. Notable degree of degenerative subcortical cystic lesions in both knees, with suspicion for tears of both the right and left medial meniscus posterior horn. Please note that today's exam was not optimized to assess the knees,  and the positive predictive value of today's exam for meniscal lesions is adversely affected by the large field of view and lack of specific knee protocol. 2. Findings around the pubis raise suspicion for athletic pubalgia on the left, with a suspected small tear of the left adductor aponeurosis. Today's exam was not optimized to assess for athletic pubalgia. 3. We partially image notable degree of edema along the sacroiliac joints, favoring bilateral sacroiliitis given the symmetry. This is only partially included on today's exam. 4. Low-level marrow edema in the ischia adjacent to the hamstring origination sites, but without a well-defined hamstring tear. 5. Small right Baker's cyst. Electronically Signed   By: Cindra Eves.D.  On: 05/02/2018 19:29   Mr Femur Left Wo Contrast  Result Date: 05/02/2018 CLINICAL DATA:  Bilateral upper leg pain after walking today. EXAM: MR OF THE LEFT FEMUR WITHOUT CONTRAST; MRI OF THE RIGHT FEMUR WITHOUT CONTRAST TECHNIQUE: Multiplanar, multisequence MR imaging of the left femur/upper leg was performed. No intravenous contrast was administered. Multiplanar, multisequence MR imaging of the right femur/upper leg was performed. No intravenous contrast was administered. COMPARISON:  None. FINDINGS: LEFT FEMUR/UPPER LEG: Bones/Joint/Cartilage No appreciable fracture. There is some subcortical marrow edema posterolaterally in the proximal tibia adjacent to what appears to be an osteochondral lesion. Separate degenerative subcortical cystic lesion of the anteromedial left tibia with faint adjacent marrow edema. Smaller similar lesions laterally in the femoral trochlear groove on the left. On the top most axial images, there appears to be abnormal edema in both the sacral ala and in the adjacent iliac bones. Subtle marrow edema in the ischial tuberosities bilaterally adjacent to the hamstring origination sites. Edema signal in the pubic symphysis with endosteal edema in the  left pubis without appearance which could reflect athletic pubalgia on the left, for example on image 14/24. Ligaments N/A Muscles and Tendons No significant abnormal muscular edema or tendinous discontinuity in the left thigh. Soft tissues Today's exam should not be considered accurate in assessment of the menisci, but the appearance on image 20/21 raises suspicion for a tear of the posterior horn medial meniscus. RIGHT FEMUR/UPPER LEG: Bones/Joint/Cartilage As on the contralateral side, there is abnormal edema along the sacroiliac joint favoring sacroiliitis. Subtle marrow edema in knee ischium adjacent to the origination sites of the hamstring tendons. Degenerative subcortical lesions are present along the left lateral femoral trochlear groove, posterolaterally in the medial tibial plateau, and posteromedially along the medial femoral condyle. Marginal spurring noted. Ligaments N/A Muscles and Tendons No significant abnormal muscular edema. Soft tissues Small Baker's cyst. As on the contralateral side, parasagittal images raise suspicion of a tear of the posterior horn medial meniscus. IMPRESSION: 1. Notable degree of degenerative subcortical cystic lesions in both knees, with suspicion for tears of both the right and left medial meniscus posterior horn. Please note that today's exam was not optimized to assess the knees, and the positive predictive value of today's exam for meniscal lesions is adversely affected by the large field of view and lack of specific knee protocol. 2. Findings around the pubis raise suspicion for athletic pubalgia on the left, with a suspected small tear of the left adductor aponeurosis. Today's exam was not optimized to assess for athletic pubalgia. 3. We partially image notable degree of edema along the sacroiliac joints, favoring bilateral sacroiliitis given the symmetry. This is only partially included on today's exam. 4. Low-level marrow edema in the ischia adjacent to the  hamstring origination sites, but without a well-defined hamstring tear. 5. Small right Baker's cyst. Electronically Signed   By: Van Clines M.D.   On: 05/02/2018 19:29    Assessment/Plan 1.  Complication dialysis device with non-functional catheter:  Patient's Tunneled catheter is not being used. The patient has an extremity access that is functioning well. Therefore, the patient will undergo removal of the tunneled catheter under local anesthesia.  The risks and benefits were described to the patient.  All questions were answered.  The patient agrees to proceed with angiography and intervention. Potassium will be drawn to ensure that it is an appropriate level prior to performing intervention. 2.  End-stage renal disease requiring hemodialysis:  Patient will continue dialysis therapy without further  interruption  3.  Hypertension:  Patient will continue medical management; nephrology is following no changes in oral medications. 4. Diabetes mellitus:  Glucose will be monitored and oral medications been held this morning once the patient has undergone the patient's procedure po intake will be reinitiated and again Accu-Cheks will be used to assess the blood glucose level and treat as needed. The patient will be restarted on the patient's usual hypoglycemic regime     Leotis Pain, MD  05/04/2018 10:32 AM

## 2018-05-04 NOTE — Op Note (Signed)
Operative Note     Preoperative diagnosis:   1. ESRD with functional permanent access  Postoperative diagnosis:  1. ESRD with functional permanent access  Procedure:  Removal of right jugular Permcath  Surgeon:  Leotis Pain, MD  Anesthesia:  Local  EBL:  Minimal  Indication for the Procedure:  The patient has a functional permanent dialysis access and no longer needs their permcath.  This can be removed.  Risks and benefits are discussed and informed consent is obtained.  Description of the Procedure:  The patient's right neck, chest and existing catheter were sterilely prepped and draped. The area around the catheter was anesthetized copiously with 1% lidocaine. The catheter was dissected out with curved hemostats until the cuff was freed from the surrounding fibrous sheath. The fiber sheath was transected, and the catheter was then removed in its entirety using gentle traction. Pressure was held and sterile dressings were placed. The patient tolerated the procedure well and was taken to the recovery room in stable condition.     Leotis Pain  05/04/2018, 11:25 AM This note was created with Dragon Medical transcription system. Any errors in dictation are purely unintentional.

## 2018-05-05 ENCOUNTER — Other Ambulatory Visit: Payer: Self-pay

## 2018-05-05 NOTE — Patient Outreach (Signed)
[facility]) Care Management  05/05/2018  Donald Fernandez 08/08/1970 [id number]  Successful outreach to the patient on today's date, HIPAA identifiers confirmed. BSW introduced self to the patient and the reason for today's call, indicating the patient had been referred for assistance with transportation resources. The patient reports he lives in [place] and uses [facility] for transportation to in county appointments. Unfortunately, this resource does not transport patients out of county and the patient see physicians in [place] and [place]. The patient reports his "C-PAP machine doctor" is in [place] and he has recently been referred to a urologist in [place]. The patient has been paying someone approximately $40 to assist with transportation. The patient reports concerns with affording other basic needs and medical bills if he has to continue paying for transportation. The patient reports he has recently applied for Medicaid with copies of outstanding medical bills but was told he was over the income limit.   BSW discussed barriers to resources due to limited options for out of county transportation. The patient is not interested in switching to a provider within his county of residence.  BSW briefly discussed option to take the PART bus but explained this was a public bus and was not similar to [employer]. The patient is interested in learning more but is currently at dialysis and requests a return call later next week. BSW will reschedule the patient to complete consult.  Daneen Schick, BSW, CDP [facility] [phone]

## 2018-05-08 DIAGNOSIS — E119 Type 2 diabetes mellitus without complications: Secondary | ICD-10-CM | POA: Diagnosis not present

## 2018-05-08 DIAGNOSIS — Z992 Dependence on renal dialysis: Secondary | ICD-10-CM | POA: Diagnosis not present

## 2018-05-11 ENCOUNTER — Other Ambulatory Visit: Payer: Self-pay

## 2018-05-11 NOTE — Patient Outreach (Signed)
[facility] [facility]) Care Management  05/11/2018  Donald Fernandez [birth year] [id number]  Successful outreach to the patient on today's date, HIPAA identifiers confirmed. BSW reviewed the PART bus routes with the patient. It is determined due to the fixed route and stops this would not be a feasible resource for the patient to access. The patient reports he has an [place] appointment on February 4th to [facility] and has yet to confirm transportation. BSW will assist with transportation arrangement via [place]. The patient denies any future appointment concerns as he has canceled his [place] appointments due to lack of resources. BSW will outreach the patient in the next two business days once transportation is confirmed to provide pick-up time.  Daneen Schick, BSW, CDP [facility] [phone]

## 2018-05-12 DIAGNOSIS — Z992 Dependence on renal dialysis: Secondary | ICD-10-CM | POA: Diagnosis not present

## 2018-05-12 DIAGNOSIS — N186 End stage renal disease: Secondary | ICD-10-CM | POA: Diagnosis not present

## 2018-05-15 ENCOUNTER — Other Ambulatory Visit: Payer: Self-pay

## 2018-05-15 DIAGNOSIS — D631 Anemia in chronic kidney disease: Secondary | ICD-10-CM | POA: Diagnosis not present

## 2018-05-15 DIAGNOSIS — D509 Iron deficiency anemia, unspecified: Secondary | ICD-10-CM | POA: Diagnosis not present

## 2018-05-15 DIAGNOSIS — Z992 Dependence on renal dialysis: Secondary | ICD-10-CM | POA: Diagnosis not present

## 2018-05-15 DIAGNOSIS — N2581 Secondary hyperparathyroidism of renal origin: Secondary | ICD-10-CM | POA: Diagnosis not present

## 2018-05-15 DIAGNOSIS — N186 End stage renal disease: Secondary | ICD-10-CM | POA: Diagnosis not present

## 2018-05-15 NOTE — Patient Outreach (Signed)
[place] [facility]) Care Management  05/15/2018  Donald Fernandez 1970/09/26 [id number]  Successful outreach to the patient to confirm transportation arrangements for appointment on Tuesday February 5th. The patient is aware he will be picked up at 9:30 am and transported via [place], [place], [place] Management Social Worker [phone]

## 2018-05-16 ENCOUNTER — Other Ambulatory Visit: Payer: Self-pay

## 2018-05-16 DIAGNOSIS — S76311A Strain of muscle, fascia and tendon of the posterior muscle group at thigh level, right thigh, initial encounter: Secondary | ICD-10-CM | POA: Diagnosis not present

## 2018-05-16 DIAGNOSIS — M25562 Pain in left knee: Secondary | ICD-10-CM | POA: Diagnosis not present

## 2018-05-16 DIAGNOSIS — S76312A Strain of muscle, fascia and tendon of the posterior muscle group at thigh level, left thigh, initial encounter: Secondary | ICD-10-CM | POA: Diagnosis not present

## 2018-05-16 DIAGNOSIS — M25561 Pain in right knee: Secondary | ICD-10-CM | POA: Diagnosis not present

## 2018-05-16 DIAGNOSIS — M170 Bilateral primary osteoarthritis of knee: Secondary | ICD-10-CM | POA: Diagnosis not present

## 2018-05-16 DIAGNOSIS — E291 Testicular hypofunction: Secondary | ICD-10-CM | POA: Diagnosis not present

## 2018-05-16 NOTE — Patient Outreach (Signed)
[facility]) Care Management  05/16/2018  Donald Fernandez January 08, 1971 [id number]  Incoming call from the patient who reports he received a letter in the mail regarding an appointment in [place]. The patient would like assistance with transportation and states it is a very important appointment about having a kidney transplant. The patient reports his mother has the letter and he is en route to his urology appointment. The patient requests BSW outreach his mother to discuss appointment details.  BSW placed an unsuccessful call to the patients mom. BSW left a HIPAA compliant voice message requesting a return call. BSW will attempt to outreach the patient and/or his mother again in the next four business days if a return call is not received.  Daneen Schick, BSW, CDP [facility] [phone]

## 2018-05-16 NOTE — Patient Outreach (Signed)
[facility]) Care Management  05/16/2018  TAVYN KURKA [birth year] [id number]  Incomming call from the patients mom Esaul Dorwart who the patient has given BSW verbal permission to speak with. Annie reads a letter the patient received from the "Kidney and Pancreas Transplant Program" out of [facility] It is indicated the patient has an appointment on February 25th for orientation and to meet with a doctor and dietician. Mrs. Castille does not recall her son being placed on a transplant list but reports "he said he was on a list".   BSW was given the kidney transplant coordinator contact number [phone]) as indicated on the letter to follow up with appointment information. BSW explained that this BSW would place an outreach call to gain more information and look into transportation options. Mrs. Knighton stated understanding and is aware this BSW will contact she and/or the patient in the next week to discuss a plan.  Daneen Schick, BSW, CDP [facility] [phone]

## 2018-05-18 ENCOUNTER — Other Ambulatory Visit: Payer: Self-pay | Admitting: Otolaryngology

## 2018-05-18 ENCOUNTER — Other Ambulatory Visit: Payer: Self-pay

## 2018-05-18 DIAGNOSIS — R000 Tachycardia, unspecified: Secondary | ICD-10-CM | POA: Diagnosis not present

## 2018-05-18 DIAGNOSIS — I482 Chronic atrial fibrillation, unspecified: Secondary | ICD-10-CM | POA: Diagnosis not present

## 2018-05-18 DIAGNOSIS — I4891 Unspecified atrial fibrillation: Secondary | ICD-10-CM | POA: Diagnosis not present

## 2018-05-18 DIAGNOSIS — I2510 Atherosclerotic heart disease of native coronary artery without angina pectoris: Secondary | ICD-10-CM | POA: Diagnosis not present

## 2018-05-18 DIAGNOSIS — E782 Mixed hyperlipidemia: Secondary | ICD-10-CM | POA: Diagnosis not present

## 2018-05-18 DIAGNOSIS — Z950 Presence of cardiac pacemaker: Secondary | ICD-10-CM | POA: Diagnosis not present

## 2018-05-18 DIAGNOSIS — R0609 Other forms of dyspnea: Secondary | ICD-10-CM | POA: Diagnosis not present

## 2018-05-18 DIAGNOSIS — I10 Essential (primary) hypertension: Secondary | ICD-10-CM | POA: Diagnosis not present

## 2018-05-18 DIAGNOSIS — H4710 Unspecified papilledema: Secondary | ICD-10-CM | POA: Diagnosis not present

## 2018-05-18 DIAGNOSIS — H93A2 Pulsatile tinnitus, left ear: Secondary | ICD-10-CM

## 2018-05-18 DIAGNOSIS — E7800 Pure hypercholesterolemia, unspecified: Secondary | ICD-10-CM | POA: Diagnosis not present

## 2018-05-18 DIAGNOSIS — N185 Chronic kidney disease, stage 5: Secondary | ICD-10-CM | POA: Diagnosis not present

## 2018-05-18 DIAGNOSIS — I480 Paroxysmal atrial fibrillation: Secondary | ICD-10-CM | POA: Diagnosis not present

## 2018-05-18 NOTE — Patient Outreach (Signed)
[place] [facility]) Care Management  05/18/2018  KEREM GILMER [birth year] [id number]  BSW placed a care coordination call to [place] [phone])  the kidney transplant coordinator. Unfortunately, this call was unsuccessful and Mrs. Valma Cava voice message indicated she would be out of the office until February 12th. BSW left a HIPAA compliant voice message requesting a return call. BSW also outreached an alternate number to the [facility] transplant and kidney scheduling team and left a voice message on this number as well.  Daneen Schick, BSW, CDP [facility] [phone]

## 2018-05-22 ENCOUNTER — Other Ambulatory Visit: Payer: Self-pay

## 2018-05-22 NOTE — Patient Outreach (Signed)
[facility] [facility]) Care Management  05/22/2018  CAEDEN FOOTS 1970-05-06 [id number]  BSW outreached the patient to discuss plan to transition to an alternate social worker as this Probation officer will take on a new role in the coming days. BSW provided the patient with the main [place] Management contact number to utilize if assistance is needed prior to new Web designer.  Daneen Schick, BSW, CDP [facility] [phone]

## 2018-05-24 ENCOUNTER — Other Ambulatory Visit: Payer: Self-pay | Admitting: *Deleted

## 2018-05-24 NOTE — Patient Outreach (Signed)
[facility]) Care Management  05/24/2018  Donald Fernandez [birth year] [id number]   This social worker contacted patient to discuss options for transportation to the Kidney and Pancreas transplant program in [place]. Per patient, he does not "really" have any friends or family that can transport him to [facility] and is requesting assistance. Patient currently receiving dialysis and could not talk.   Plan: This social worker will call patient back tomorrow to further discuss transportation options.    Sheralyn Boatman [facility] Care Management [phone]

## 2018-05-24 NOTE — Patient Outreach (Signed)
[facility]) Care Management  05/24/2018  AMILLION MACCHIA 1970/09/26 [id number]   Phone call to the Kidney and Pancres transplan program to discuss transporation options in general. This social worker spoke with Estill Bamberg in scheduling who reported that during patient's initially appointment he will consult with several disciplines including a Education officer, museum and Chief Executive Officer. They may be able to provide patient with resources related to transportation as he may have several follow up appointments following the initial orientation.   This Education officer, museum contacted the Medical illustrator. This social worker left a message to discuss transportation options for patient.    Plan: This Education officer, museum will await a return call from the nurse coordinator regarding transportation options.   Sheralyn Boatman [facility] Care Management [phone]

## 2018-05-25 ENCOUNTER — Other Ambulatory Visit: Payer: Self-pay | Admitting: *Deleted

## 2018-05-25 NOTE — Patient Outreach (Signed)
[facility]) Care Management  05/25/2018  Donald Fernandez 04-Jan-1971 [id number]   Phone call to patient to discuss transportation needs. Per patient, in addition to transportation need to [facility], he will need  additional transportation to [facility] for lab work and follow up.   This Education officer, museum explored alternative options for transportation with patient as it was discussed that [facility] could not provide ongoing transportation to his medical appointments. Patient does utilize [employer], research (physical sciences) for his local tranpsortation needs, however [facility] does not travel to [place]. Patient would not consider changing to a local Urologist stating that he had already been established with them and did not want to change. Patient further discussed having a car but will have to save his money for needed repairs before use.  Patient reminded of the option to change to a local Urologist so that  [facility] can be utilized. This Education officer, museum further discussed the option of using the [employer] bus for out of county transportation. Repairing his car was also  emphasized as top priority, specifically for travel to [facility], Kidney and Pancreas Transport Program  Plan: This social worker to follow up with the Kidney and [place] to confirm transportation options and will follow up with patient.    Sheralyn Boatman [facility] Care Management [phone]

## 2018-05-29 ENCOUNTER — Other Ambulatory Visit: Payer: Self-pay

## 2018-05-29 ENCOUNTER — Other Ambulatory Visit: Payer: Self-pay | Admitting: *Deleted

## 2018-05-29 NOTE — Patient Outreach (Signed)
[name]) Care Management  05/29/2018  Donald Fernandez 06/10/1970 [id number]   Transportation arranged via [place] to Kidney and Pancreas Transplant Program on 06/06/18 @ 11:15 AM.  Ronn Melena, [place] Worker [phone]

## 2018-05-29 NOTE — Patient Outreach (Signed)
[facility]) Care Management  05/29/2018  Donald Fernandez 11/29/1970 [id number]   Phone call to patient to inform him that [facility] will provide transportation to his initial appointment with the Kidney and pancreas transplant program in [place] on 06/06/18   This social worker discussed the need for reliable transportation as a presumed requirement to be place on the transplant list. Per patient, he has a car, however it needs over $600.00 in repairs(fuel pump, battery and catalytic converter plus labor) Patient not sure if he can cover this expense. Patient appeared resistant to anticipated changes that may possibly need to be made related to the transplant program.   Plan: This social worker will arrange transportation for patient to get to his initial appointment to the Kidney and pancreas transplant program on 06/06/18 at 11:15pm [place] [address] 3 hour appointment     Sheralyn Boatman [facility] Care Management [phone]

## 2018-06-05 DIAGNOSIS — Z992 Dependence on renal dialysis: Secondary | ICD-10-CM | POA: Diagnosis not present

## 2018-06-06 DIAGNOSIS — Z8249 Family history of ischemic heart disease and other diseases of the circulatory system: Secondary | ICD-10-CM | POA: Diagnosis not present

## 2018-06-06 DIAGNOSIS — Z833 Family history of diabetes mellitus: Secondary | ICD-10-CM | POA: Diagnosis not present

## 2018-06-06 DIAGNOSIS — N186 End stage renal disease: Secondary | ICD-10-CM | POA: Diagnosis not present

## 2018-06-06 DIAGNOSIS — I679 Cerebrovascular disease, unspecified: Secondary | ICD-10-CM | POA: Diagnosis not present

## 2018-06-06 DIAGNOSIS — Z8739 Personal history of other diseases of the musculoskeletal system and connective tissue: Secondary | ICD-10-CM | POA: Diagnosis not present

## 2018-06-06 DIAGNOSIS — Z9109 Other allergy status, other than to drugs and biological substances: Secondary | ICD-10-CM | POA: Diagnosis not present

## 2018-06-06 DIAGNOSIS — G2581 Restless legs syndrome: Secondary | ICD-10-CM | POA: Diagnosis not present

## 2018-06-06 DIAGNOSIS — E1122 Type 2 diabetes mellitus with diabetic chronic kidney disease: Secondary | ICD-10-CM | POA: Diagnosis not present

## 2018-06-06 DIAGNOSIS — J3489 Other specified disorders of nose and nasal sinuses: Secondary | ICD-10-CM | POA: Diagnosis not present

## 2018-06-06 DIAGNOSIS — Z6841 Body Mass Index (BMI) 40.0 and over, adult: Secondary | ICD-10-CM | POA: Diagnosis not present

## 2018-06-06 DIAGNOSIS — G4733 Obstructive sleep apnea (adult) (pediatric): Secondary | ICD-10-CM | POA: Diagnosis not present

## 2018-06-06 DIAGNOSIS — M170 Bilateral primary osteoarthritis of knee: Secondary | ICD-10-CM | POA: Diagnosis not present

## 2018-06-06 DIAGNOSIS — Z9989 Dependence on other enabling machines and devices: Secondary | ICD-10-CM | POA: Diagnosis not present

## 2018-06-06 DIAGNOSIS — E1121 Type 2 diabetes mellitus with diabetic nephropathy: Secondary | ICD-10-CM | POA: Diagnosis not present

## 2018-06-06 DIAGNOSIS — Z992 Dependence on renal dialysis: Secondary | ICD-10-CM | POA: Diagnosis not present

## 2018-06-06 DIAGNOSIS — E118 Type 2 diabetes mellitus with unspecified complications: Secondary | ICD-10-CM | POA: Diagnosis not present

## 2018-06-06 DIAGNOSIS — F17210 Nicotine dependence, cigarettes, uncomplicated: Secondary | ICD-10-CM | POA: Diagnosis not present

## 2018-06-06 DIAGNOSIS — K219 Gastro-esophageal reflux disease without esophagitis: Secondary | ICD-10-CM | POA: Diagnosis not present

## 2018-06-06 DIAGNOSIS — G932 Benign intracranial hypertension: Secondary | ICD-10-CM | POA: Diagnosis not present

## 2018-06-06 DIAGNOSIS — I10 Essential (primary) hypertension: Secondary | ICD-10-CM | POA: Diagnosis not present

## 2018-06-06 DIAGNOSIS — M109 Gout, unspecified: Secondary | ICD-10-CM | POA: Diagnosis not present

## 2018-06-06 DIAGNOSIS — I120 Hypertensive chronic kidney disease with stage 5 chronic kidney disease or end stage renal disease: Secondary | ICD-10-CM | POA: Diagnosis not present

## 2018-06-06 DIAGNOSIS — E1142 Type 2 diabetes mellitus with diabetic polyneuropathy: Secondary | ICD-10-CM | POA: Diagnosis not present

## 2018-06-06 DIAGNOSIS — Z01818 Encounter for other preprocedural examination: Secondary | ICD-10-CM | POA: Diagnosis not present

## 2018-06-06 DIAGNOSIS — I482 Chronic atrial fibrillation, unspecified: Secondary | ICD-10-CM | POA: Diagnosis not present

## 2018-06-07 ENCOUNTER — Other Ambulatory Visit: Payer: Self-pay | Admitting: *Deleted

## 2018-06-07 DIAGNOSIS — Z8739 Personal history of other diseases of the musculoskeletal system and connective tissue: Secondary | ICD-10-CM | POA: Insufficient documentation

## 2018-06-07 DIAGNOSIS — I679 Cerebrovascular disease, unspecified: Secondary | ICD-10-CM | POA: Insufficient documentation

## 2018-06-07 DIAGNOSIS — K219 Gastro-esophageal reflux disease without esophagitis: Secondary | ICD-10-CM | POA: Insufficient documentation

## 2018-06-07 DIAGNOSIS — Z992 Dependence on renal dialysis: Secondary | ICD-10-CM | POA: Insufficient documentation

## 2018-06-07 DIAGNOSIS — N186 End stage renal disease: Secondary | ICD-10-CM | POA: Insufficient documentation

## 2018-06-07 NOTE — Patient Outreach (Signed)
[facility]) Care Management  06/07/2018  ALEXIS MIZUNO Sep 28, 1970 [id number]    Phone call to patient to follow up on appointment to the Kidney and Pancreas transplant program in [place]. Transportation provided by [facility] with the understanding that the assistance would be to the initial appointment only. Per patient, the appointment went well, however he will need to see at least 3 more doctors in the next few months.   This Education officer, museum emphasized the need for reliable transportation and  to focus on car repairs if at all possible.  This Education officer, museum discussed alternative options for transportation including private pay, enlisting family and friends, and transitioning to a  Dealer in [place].  Patient does use [facility] for local appointments.  Per patient, he is not sure at this time how he will et back to [facility] stating that there were several things he needed to do including changing his diet and losing som e weight. He further stated that he does not really have family and friends to assist him with transportation and he is not willing to change his Urologist to a local provider.   This Education officer, museum will sign off at this time as patient as transportation has been provided for patient to [facility] transplant program and alternative options have been discussed.   Sheralyn Boatman [facility] Care Management [phone]

## 2018-06-10 DIAGNOSIS — Z992 Dependence on renal dialysis: Secondary | ICD-10-CM | POA: Diagnosis not present

## 2018-06-10 DIAGNOSIS — N186 End stage renal disease: Secondary | ICD-10-CM | POA: Diagnosis not present

## 2018-06-12 DIAGNOSIS — D631 Anemia in chronic kidney disease: Secondary | ICD-10-CM | POA: Diagnosis not present

## 2018-06-12 DIAGNOSIS — N186 End stage renal disease: Secondary | ICD-10-CM | POA: Diagnosis not present

## 2018-06-12 DIAGNOSIS — D509 Iron deficiency anemia, unspecified: Secondary | ICD-10-CM | POA: Diagnosis not present

## 2018-06-12 DIAGNOSIS — Z992 Dependence on renal dialysis: Secondary | ICD-10-CM | POA: Diagnosis not present

## 2018-06-12 DIAGNOSIS — N2581 Secondary hyperparathyroidism of renal origin: Secondary | ICD-10-CM | POA: Diagnosis not present

## 2018-06-15 ENCOUNTER — Ambulatory Visit: Admission: RE | Admit: 2018-06-15 | Payer: PPO | Source: Ambulatory Visit

## 2018-07-03 DIAGNOSIS — J301 Allergic rhinitis due to pollen: Secondary | ICD-10-CM | POA: Diagnosis not present

## 2018-07-03 DIAGNOSIS — Z992 Dependence on renal dialysis: Secondary | ICD-10-CM | POA: Diagnosis not present

## 2018-07-11 DIAGNOSIS — N186 End stage renal disease: Secondary | ICD-10-CM | POA: Diagnosis not present

## 2018-07-11 DIAGNOSIS — Z992 Dependence on renal dialysis: Secondary | ICD-10-CM | POA: Diagnosis not present

## 2018-07-12 DIAGNOSIS — D631 Anemia in chronic kidney disease: Secondary | ICD-10-CM | POA: Diagnosis not present

## 2018-07-12 DIAGNOSIS — Z992 Dependence on renal dialysis: Secondary | ICD-10-CM | POA: Diagnosis not present

## 2018-07-12 DIAGNOSIS — D509 Iron deficiency anemia, unspecified: Secondary | ICD-10-CM | POA: Diagnosis not present

## 2018-07-12 DIAGNOSIS — N2581 Secondary hyperparathyroidism of renal origin: Secondary | ICD-10-CM | POA: Diagnosis not present

## 2018-07-12 DIAGNOSIS — N186 End stage renal disease: Secondary | ICD-10-CM | POA: Diagnosis not present

## 2018-07-13 DIAGNOSIS — J301 Allergic rhinitis due to pollen: Secondary | ICD-10-CM | POA: Diagnosis not present

## 2018-07-18 ENCOUNTER — Ambulatory Visit: Payer: PPO

## 2018-07-27 DIAGNOSIS — G4733 Obstructive sleep apnea (adult) (pediatric): Secondary | ICD-10-CM | POA: Diagnosis not present

## 2018-08-07 DIAGNOSIS — E119 Type 2 diabetes mellitus without complications: Secondary | ICD-10-CM | POA: Diagnosis not present

## 2018-08-07 DIAGNOSIS — Z992 Dependence on renal dialysis: Secondary | ICD-10-CM | POA: Diagnosis not present

## 2018-08-10 DIAGNOSIS — N186 End stage renal disease: Secondary | ICD-10-CM | POA: Diagnosis not present

## 2018-08-10 DIAGNOSIS — Z992 Dependence on renal dialysis: Secondary | ICD-10-CM | POA: Diagnosis not present

## 2018-08-11 DIAGNOSIS — N2581 Secondary hyperparathyroidism of renal origin: Secondary | ICD-10-CM | POA: Diagnosis not present

## 2018-08-11 DIAGNOSIS — N186 End stage renal disease: Secondary | ICD-10-CM | POA: Diagnosis not present

## 2018-08-11 DIAGNOSIS — Z992 Dependence on renal dialysis: Secondary | ICD-10-CM | POA: Diagnosis not present

## 2018-08-11 DIAGNOSIS — D631 Anemia in chronic kidney disease: Secondary | ICD-10-CM | POA: Diagnosis not present

## 2018-08-11 DIAGNOSIS — D509 Iron deficiency anemia, unspecified: Secondary | ICD-10-CM | POA: Diagnosis not present

## 2018-08-16 ENCOUNTER — Ambulatory Visit: Payer: PPO

## 2018-08-17 ENCOUNTER — Other Ambulatory Visit: Payer: Self-pay

## 2018-08-17 ENCOUNTER — Ambulatory Visit
Admission: RE | Admit: 2018-08-17 | Discharge: 2018-08-17 | Disposition: A | Discharge status: Home / Self Care | Payer: PPO | Source: Ambulatory Visit | Attending: Otolaryngology | Admitting: Otolaryngology

## 2018-08-17 DIAGNOSIS — I651 Occlusion and stenosis of basilar artery: Secondary | ICD-10-CM | POA: Diagnosis not present

## 2018-08-17 DIAGNOSIS — H93A2 Pulsatile tinnitus, left ear: Secondary | ICD-10-CM | POA: Diagnosis not present

## 2018-08-24 ENCOUNTER — Other Ambulatory Visit: Payer: Self-pay

## 2018-08-24 ENCOUNTER — Ambulatory Visit (INDEPENDENT_AMBULATORY_CARE_PROVIDER_SITE_OTHER): Payer: PPO

## 2018-08-24 ENCOUNTER — Ambulatory Visit (INDEPENDENT_AMBULATORY_CARE_PROVIDER_SITE_OTHER): Payer: PPO | Admitting: Nurse Practitioner

## 2018-08-24 ENCOUNTER — Encounter (INDEPENDENT_AMBULATORY_CARE_PROVIDER_SITE_OTHER): Payer: Self-pay | Admitting: Nurse Practitioner

## 2018-08-24 VITALS — BP 130/78 | HR 109 | Resp 17 | Wt 321.0 lb

## 2018-08-24 DIAGNOSIS — I651 Occlusion and stenosis of basilar artery: Secondary | ICD-10-CM | POA: Insufficient documentation

## 2018-08-24 DIAGNOSIS — N186 End stage renal disease: Secondary | ICD-10-CM | POA: Diagnosis not present

## 2018-08-24 DIAGNOSIS — E669 Obesity, unspecified: Secondary | ICD-10-CM | POA: Diagnosis not present

## 2018-08-24 DIAGNOSIS — Z992 Dependence on renal dialysis: Secondary | ICD-10-CM

## 2018-08-24 DIAGNOSIS — G450 Vertebro-basilar artery syndrome: Secondary | ICD-10-CM

## 2018-08-24 DIAGNOSIS — E1122 Type 2 diabetes mellitus with diabetic chronic kidney disease: Secondary | ICD-10-CM | POA: Diagnosis not present

## 2018-08-24 DIAGNOSIS — I120 Hypertensive chronic kidney disease with stage 5 chronic kidney disease or end stage renal disease: Secondary | ICD-10-CM | POA: Diagnosis not present

## 2018-08-24 DIAGNOSIS — Z79899 Other long term (current) drug therapy: Secondary | ICD-10-CM

## 2018-08-24 DIAGNOSIS — F17210 Nicotine dependence, cigarettes, uncomplicated: Secondary | ICD-10-CM | POA: Diagnosis not present

## 2018-08-24 DIAGNOSIS — T829XXS Unspecified complication of cardiac and vascular prosthetic device, implant and graft, sequela: Secondary | ICD-10-CM

## 2018-08-24 DIAGNOSIS — E1169 Type 2 diabetes mellitus with other specified complication: Secondary | ICD-10-CM

## 2018-08-24 DIAGNOSIS — I10 Essential (primary) hypertension: Secondary | ICD-10-CM

## 2018-08-24 MED ORDER — CLOPIDOGREL BISULFATE 75 MG PO TABS: 75.0000 mg | ORAL_TABLET | Freq: Every day | ORAL | 11 refills | Status: DC

## 2018-08-24 NOTE — Progress Notes (Signed)
MRN : [id number]  Donald Fernandez is a 48 y.o. (June 13, 1970) male who presents with chief complaint of  Chief Complaint  Patient presents with  . Follow-up    52month HDA  .  History of Present Illness:   The patient returns to the office for followup of their dialysis access. The function of the access has been stable. The patient denies increased bleeding time or increased recirculation. Patient denies difficulty with cannulation. The patient denies hand pain or other symptoms consistent with steal phenomena.  No significant arm swelling.  The patient denies redness or swelling at the access site. The patient denies fever or chills at home or while on dialysis.  The patient denies amaurosis fugax or recent TIA symptoms. There are no recent neurological changes noted. The patient denies claudication symptoms or rest pain symptoms. The patient denies history of DVT, PE or superficial thrombophlebitis. The patient denies recent episodes of angina or shortness of breath.   Duplex ultrasound of the AV access shows a patent access.  The previously noted stenosis is unchanged compared to last study.   The patient has a flow volume of 1190 in his ulnar ulnar wave Linq    No outpatient medications have been marked as taking for the 08/24/18 encounter (Office Visit) with Kris Hartmann, NP.    Past Medical History:  Diagnosis Date  . Arthritis   . Asthma    triggered by enviornmental allergies  . BPH (benign prostatic hyperplasia)   . Diabetes mellitus without complication ([place])   . Dialysis patient [facility])    dialysis T-TH-SAT  . GERD (gastroesophageal reflux disease)   . Gout   . Hyperlipidemia   . Hypertension   . Neuropathy   . Renal disorder    CKD4; end stage renal disease  . Restless legs   . Sleep apnea    uses cpap    Past Surgical History:  Procedure Laterality Date  . A/V FISTULAGRAM N/A 06/08/2017   Procedure: A/V FISTULAGRAM;  Surgeon: Algernon Huxley, MD;   Location: [place] [facility];  Service: Cardiovascular;  Laterality: N/A;  . ANKLE SURGERY Right    fracture repair with pins, plates, screws  . AV FISTULA INSERTION W/ RF MAGNETIC GUIDANCE Left 09/02/2017   Procedure: AV FISTULA INSERTION W/RF MAGNETIC GUIDANCE;  Surgeon: Katha Cabal, MD;  Location: [place] CV [facility];  Service: Cardiovascular;  Laterality: Left;  . AV FISTULA PLACEMENT Left 08/04/2016   Procedure: ARTERIOVENOUS (AV) FISTULA CREATION ( RADIOCEPHALIC );  Surgeon: Katha Cabal, MD;  Location: [facility] ORS;  Service: Vascular;  Laterality: Left;  . DIALYSIS/PERMA CATHETER INSERTION N/A 06/06/2017   Procedure: DIALYSIS/PERMA CATHETER INSERTION;  Surgeon: Algernon Huxley, MD;  Location: [place] CV [facility];  Service: Cardiovascular;  Laterality: N/A;  . DIALYSIS/PERMA CATHETER REMOVAL N/A 05/04/2018   Procedure: DIALYSIS/PERMA CATHETER REMOVAL;  Surgeon: Algernon Huxley, MD;  Location: [place] [facility];  Service: Cardiovascular;  Laterality: N/A;  . IR ANGIO INTRA EXTRACRAN SEL INTERNAL CAROTID UNI R MOD SED  12/17/2016  . RADIOLOGY WITH ANESTHESIA N/A 12/17/2016   Procedure: Arteriogram/Venogram with right venous sinus manometry;  Surgeon: Consuella Lose, MD;  Location: [place];  Service: Radiology;  Laterality: N/A;  . REVISON OF ARTERIOVENOUS FISTULA Left 01/04/2018   Procedure: REVISON OF ARTERIOVENOUS FISTULA ( BRACHIALCEPHALIC );  Surgeon: Katha Cabal, MD;  Location: [facility] OR[facility];  Service: Vascular;  Laterality: Left;  . UPPER EXTREMITY ANGIOGRAPHY Left 10/19/2017  Procedure: UPPER EXTREMITY ANGIOGRAPHY;  Surgeon: Katha Cabal, MD;  Location: [place] CV [facility];  Service: Cardiovascular;  Laterality: Left;    Social History Social History   Tobacco Use  . Smoking status: Current Every Day Smoker    Types: Cigarettes  . Smokeless tobacco: Never Used  . Tobacco comment: APPROX. 1 PK/WEEK   Substance Use Topics  . Alcohol use: Yes    Comment: weekends  .  Drug use: Yes    Types: Marijuana    Comment: LAST TIME APPROX. 08/03/17    Family History Family History  Problem Relation Age of Onset  . AAA (abdominal aortic aneurysm) Mother   . Diabetes Mother   . Heart disease Father     Allergies  Allergen Reactions  . Accupril [Quinapril Hcl] Anaphylaxis    Angioedema  . Ace Inhibitors Anaphylaxis    Angioedema     REVIEW OF SYSTEMS (Negative unless checked)  Constitutional: [] Weight loss  [] Fever  [] Chills Cardiac: [] Chest pain   [] Chest pressure   [] Palpitations   [] Shortness of breath when laying flat   [] Shortness of breath with exertion. Vascular:  [] Pain in legs with walking   [] Pain in legs at rest  [] History of DVT   [] Phlebitis   [] Swelling in legs   [] Varicose veins   [] Non-healing ulcers Pulmonary:   [] Uses home oxygen   [] Productive cough   [] Hemoptysis   [] Wheeze  [] COPD   [] Asthma Neurologic:  [x] Dizziness   [] Seizures   [] History of stroke   [] History of TIA  [] Aphasia   [] Vissual changes   [] Weakness or numbness in arm   [] Weakness or numbness in leg Musculoskeletal:   [] Joint swelling   [] Joint pain   [] Low back pain Hematologic:  [] Easy bruising  [] Easy bleeding   [] Hypercoagulable state   [x] Anemic Gastrointestinal:  [] Diarrhea   [] Vomiting  [] Gastroesophageal reflux/heartburn   [] Difficulty swallowing. Genitourinary:  [x] Chronic kidney disease   [] Difficult urination  [] Frequent urination   [] Blood in urine Skin:  [] Rashes   [] Ulcers  Psychological:  [] History of anxiety   []  History of major depression.  Physical Examination  Vitals:   08/24/18 1519  BP: 130/78  Pulse: (!) 109  Resp: 17  Weight: (!) 321 lb (145.6 kg)   Body mass index is 44.77 kg/m. Gen: WD/WN, NAD Head: [place]/AT, No temporalis wasting.  Ear/Nose/Throat: Hearing grossly intact, nares w/o erythema or drainage Eyes: PER, EOMI, sclera nonicteric.  Neck: Supple, no large masses.   Pulmonary:  Good air movement, no audible wheezing bilaterally,  no use of accessory muscles.  Cardiac: RRR, no JVD Vascular: Good thrill and bruit Vessel Right Left  Radial Palpable Palpable  Gastrointestinal: Non-distended. No guarding/no peritoneal signs.  Musculoskeletal: M/S 5/5 throughout.  No deformity or atrophy.  Neurologic: CN 2-12 intact. Symmetrical.  Speech is fluent. Motor exam as listed above. Psychiatric: Judgment intact, Mood & affect appropriate for pt's clinical situation. Dermatologic: No rashes or ulcers noted.  No changes consistent with cellulitis. Lymph : No lichenification or skin changes of chronic lymphedema.  CBC Lab Results  Component Value Date   WBC 7.9 12/14/2017   HGB 12.6 (L) 01/04/2018   HCT 37.0 (L) 01/04/2018   MCV 93.5 12/14/2017   PLT 247 12/14/2017    BMET    Component Value Date/Time   NA 140 01/04/2018 0628   NA 145 12/31/2011 1850   K 3.5 01/04/2018 0628   K 3.5 12/31/2011 1850   CL 100 12/14/2017 1400  CL 108 (H) 12/31/2011 1850   CO2 22 12/14/2017 1400   CO2 28 12/31/2011 1850   GLUCOSE 97 01/04/2018 0628   GLUCOSE 63 (L) 12/31/2011 1850   BUN 52 (H) 12/14/2017 1400   BUN 14 12/31/2011 1850   CREATININE 12.01 (H) 12/14/2017 1400   CREATININE 1.72 (H) 12/31/2011 1850   CALCIUM 8.4 (L) 12/14/2017 1400   CALCIUM 8.1 (L) 06/06/2017 2038   GFRNONAA 4 (L) 12/14/2017 1400   GFRNONAA 48 (L) 12/31/2011 1850   GFRAA 5 (L) 12/14/2017 1400   GFRAA 55 (L) 12/31/2011 1850   CrCl cannot be calculated (Patient's most recent lab result is older than the maximum 21 days allowed.).  COAG Lab Results  Component Value Date   INR 0.88 12/14/2017   INR 0.90 09/02/2017   INR 0.91 08/08/2017    Radiology Mr Jodene Nam Head Wo Contrast  Result Date: 08/17/2018 CLINICAL DATA:  Left pulsatile tinnitus. EXAM: MRA HEAD WITHOUT CONTRAST TECHNIQUE: Angiographic images of the Circle of Willis were obtained using MRA technique without intravenous contrast. COMPARISON:  None. FINDINGS: The visualized distal vertebral  arteries are patent to the basilar with the left being moderately dominant. PICA origins were not imaged. AICAs and SCAs are grossly patent proximally bilaterally. The basilar artery is patent with an approximately stenosis in its midportion. PCAs are patent with asymmetric attenuation of distal left PCA branch vessels. Mild P2 stenoses are present bilaterally. The internal carotid arteries are widely patent from skull base to carotid termini. ACAs and MCAs are patent with branch vessel irregularity but no evidence of proximal branch occlusion or significant proximal stenosis. No aneurysm or vascular malformation is identified. IMPRESSION: 1. Intracranial atherosclerosis including a 60% basilar artery stenosis. 2. No major vessel occlusion, aneurysm, or vascular malformation. Electronically Signed   By: Logan Bores M.D.   On: 08/17/2018 13:37      Assessment/Plan 1. ESRD on dialysis [facility]) Recommend:  The patient is doing well and currently has adequate dialysis access. The patient's dialysis center is not reporting any access issues. Flow pattern is stable when compared to the prior ultrasound.  The patient should have a duplex ultrasound of the dialysis access in 6 months. The patient will follow-up with me in the office after each ultrasound    - VAS [place] (AVF, AVG); Future  2. Essential hypertension Continue antihypertensive medications as already ordered, these medications have been reviewed and there are no changes at this time.   3. Diabetes mellitus type 2 in obese [facility]) Continue hypoglycemic medications as already ordered, these medications have been reviewed and there are no changes at this time.  Hgb A1C to be monitored as already arranged by primary service   4. Vertebro-basilar artery syndrome Currently patient's basilar stenosis is at 60%.  Generally we intervene at 80%.  Will optimize medical therapy at this time by adding Plavix to the patient's  medication regimen.  We will also plan to repeat an MRA of his head in 1 year.  Patient will also continue to take his aspirin on a daily basis. - clopidogrel (PLAVIX) 75 MG tablet; Take 1 tablet (75 mg total) by mouth daily.  Dispense: 30 tablet; Refill: 11 - MR MRA HEAD WO CONTRAST; Future   Kris Hartmann, NP  08/24/2018 3:46 PM

## 2018-08-25 ENCOUNTER — Telehealth (INDEPENDENT_AMBULATORY_CARE_PROVIDER_SITE_OTHER): Payer: Self-pay | Admitting: Nurse Practitioner

## 2018-08-25 NOTE — Telephone Encounter (Signed)
Ok, thank you.  That shouldn't change the current plan of care

## 2018-08-25 NOTE — Telephone Encounter (Signed)
Patient left message on nurse line stating the medication he couldn't think of in his office visit was Montelukast 10mg  1 tab daily. This med is from ENT. AS, CMA

## 2018-09-06 DIAGNOSIS — Z992 Dependence on renal dialysis: Secondary | ICD-10-CM | POA: Diagnosis not present

## 2018-09-08 IMAGING — RF DG FLUORO GUIDE LUMBAR PUNCTURE
1 series · 1 of 1 positions shown · non-contrast
Comparison: none

CLINICAL DATA: Papilledema.

[Series 1: cp_standard · 0.17mm/px · 1 of 1 slices shown]
[im 1/1]
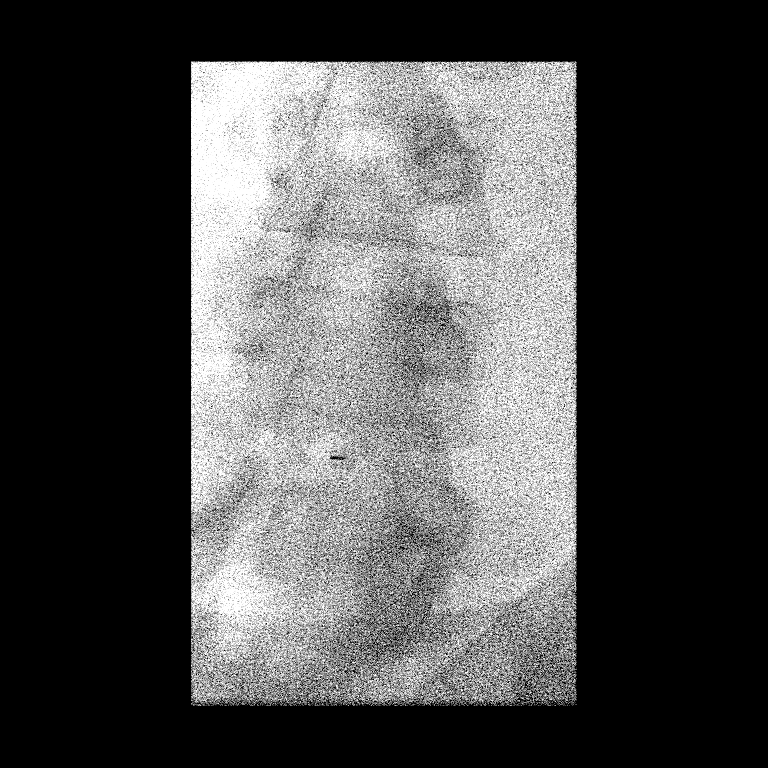

[1 of 1 positions shown; findings below may reference images not displayed]

EXAM:
DIAGNOSTIC LUMBAR PUNCTURE UNDER FLUOROSCOPIC GUIDANCE

FLUOROSCOPY TIME:  Fluoroscopy Time:  0.8 minutes

Radiation Exposure Index (if provided by the fluoroscopic device):
26 mGy

Number of Acquired Spot Images: 0

PROCEDURE:
Informed consent was obtained from the patient prior to the
procedure, including potential complications of headache, allergy,
and pain. With the patient prone, the lower back was prepped with
Betadine. 1% Lidocaine was used for local anesthesia. Lumbar
puncture was performed at the L4-5 level using a 22 gauge needle
with return of clear CSF with an opening pressure of 25 cm water. 10
ml of CSF were obtained for laboratory studies. Closing CSF pressure
was 12 cm water. The patient tolerated the procedure well and there
were no apparent complications.
IMPRESSION: Successful diagnostic lumbar puncture under fluoroscopic guidance.

## 2018-09-10 DIAGNOSIS — Z992 Dependence on renal dialysis: Secondary | ICD-10-CM | POA: Diagnosis not present

## 2018-09-10 DIAGNOSIS — N186 End stage renal disease: Secondary | ICD-10-CM | POA: Diagnosis not present

## 2018-09-11 DIAGNOSIS — Z992 Dependence on renal dialysis: Secondary | ICD-10-CM | POA: Diagnosis not present

## 2018-09-11 DIAGNOSIS — N186 End stage renal disease: Secondary | ICD-10-CM | POA: Diagnosis not present

## 2018-09-11 DIAGNOSIS — D509 Iron deficiency anemia, unspecified: Secondary | ICD-10-CM | POA: Diagnosis not present

## 2018-09-11 DIAGNOSIS — N2581 Secondary hyperparathyroidism of renal origin: Secondary | ICD-10-CM | POA: Diagnosis not present

## 2018-09-11 DIAGNOSIS — D631 Anemia in chronic kidney disease: Secondary | ICD-10-CM | POA: Diagnosis not present

## 2018-10-02 DIAGNOSIS — D509 Iron deficiency anemia, unspecified: Secondary | ICD-10-CM | POA: Diagnosis not present

## 2018-10-02 DIAGNOSIS — Z992 Dependence on renal dialysis: Secondary | ICD-10-CM | POA: Diagnosis not present

## 2018-10-02 DIAGNOSIS — N186 End stage renal disease: Secondary | ICD-10-CM | POA: Diagnosis not present

## 2018-10-02 DIAGNOSIS — N2581 Secondary hyperparathyroidism of renal origin: Secondary | ICD-10-CM | POA: Diagnosis not present

## 2018-10-02 DIAGNOSIS — D631 Anemia in chronic kidney disease: Secondary | ICD-10-CM | POA: Diagnosis not present

## 2018-10-10 DIAGNOSIS — Z992 Dependence on renal dialysis: Secondary | ICD-10-CM | POA: Diagnosis not present

## 2018-10-10 DIAGNOSIS — N186 End stage renal disease: Secondary | ICD-10-CM | POA: Diagnosis not present

## 2018-10-11 DIAGNOSIS — D631 Anemia in chronic kidney disease: Secondary | ICD-10-CM | POA: Diagnosis not present

## 2018-10-11 DIAGNOSIS — D509 Iron deficiency anemia, unspecified: Secondary | ICD-10-CM | POA: Diagnosis not present

## 2018-10-11 DIAGNOSIS — N2581 Secondary hyperparathyroidism of renal origin: Secondary | ICD-10-CM | POA: Diagnosis not present

## 2018-10-11 DIAGNOSIS — Z992 Dependence on renal dialysis: Secondary | ICD-10-CM | POA: Diagnosis not present

## 2018-10-11 DIAGNOSIS — N186 End stage renal disease: Secondary | ICD-10-CM | POA: Diagnosis not present

## 2018-11-06 DIAGNOSIS — E119 Type 2 diabetes mellitus without complications: Secondary | ICD-10-CM | POA: Diagnosis not present

## 2018-11-06 DIAGNOSIS — Z992 Dependence on renal dialysis: Secondary | ICD-10-CM | POA: Diagnosis not present

## 2018-11-07 DIAGNOSIS — R000 Tachycardia, unspecified: Secondary | ICD-10-CM | POA: Diagnosis not present

## 2018-11-07 DIAGNOSIS — I4891 Unspecified atrial fibrillation: Secondary | ICD-10-CM | POA: Diagnosis not present

## 2018-11-10 DIAGNOSIS — Z992 Dependence on renal dialysis: Secondary | ICD-10-CM | POA: Diagnosis not present

## 2018-11-10 DIAGNOSIS — N186 End stage renal disease: Secondary | ICD-10-CM | POA: Diagnosis not present

## 2018-11-13 DIAGNOSIS — N186 End stage renal disease: Secondary | ICD-10-CM | POA: Diagnosis not present

## 2018-11-13 DIAGNOSIS — N2581 Secondary hyperparathyroidism of renal origin: Secondary | ICD-10-CM | POA: Diagnosis not present

## 2018-11-13 DIAGNOSIS — E8770 Fluid overload, unspecified: Secondary | ICD-10-CM | POA: Diagnosis not present

## 2018-11-13 DIAGNOSIS — D509 Iron deficiency anemia, unspecified: Secondary | ICD-10-CM | POA: Diagnosis not present

## 2018-11-13 DIAGNOSIS — Z992 Dependence on renal dialysis: Secondary | ICD-10-CM | POA: Diagnosis not present

## 2018-11-28 DIAGNOSIS — N5201 Erectile dysfunction due to arterial insufficiency: Secondary | ICD-10-CM | POA: Diagnosis not present

## 2018-11-28 DIAGNOSIS — E291 Testicular hypofunction: Secondary | ICD-10-CM | POA: Diagnosis not present

## 2018-12-04 DIAGNOSIS — Z992 Dependence on renal dialysis: Secondary | ICD-10-CM | POA: Diagnosis not present

## 2018-12-11 DIAGNOSIS — Z992 Dependence on renal dialysis: Secondary | ICD-10-CM | POA: Diagnosis not present

## 2018-12-11 DIAGNOSIS — N186 End stage renal disease: Secondary | ICD-10-CM | POA: Diagnosis not present

## 2018-12-13 DIAGNOSIS — N186 End stage renal disease: Secondary | ICD-10-CM | POA: Diagnosis not present

## 2018-12-13 DIAGNOSIS — D509 Iron deficiency anemia, unspecified: Secondary | ICD-10-CM | POA: Diagnosis not present

## 2018-12-13 DIAGNOSIS — Z992 Dependence on renal dialysis: Secondary | ICD-10-CM | POA: Diagnosis not present

## 2018-12-13 DIAGNOSIS — D631 Anemia in chronic kidney disease: Secondary | ICD-10-CM | POA: Diagnosis not present

## 2018-12-13 DIAGNOSIS — N2581 Secondary hyperparathyroidism of renal origin: Secondary | ICD-10-CM | POA: Diagnosis not present

## 2018-12-15 DIAGNOSIS — Z992 Dependence on renal dialysis: Secondary | ICD-10-CM | POA: Diagnosis not present

## 2018-12-15 DIAGNOSIS — D631 Anemia in chronic kidney disease: Secondary | ICD-10-CM | POA: Diagnosis not present

## 2018-12-15 DIAGNOSIS — N2581 Secondary hyperparathyroidism of renal origin: Secondary | ICD-10-CM | POA: Diagnosis not present

## 2018-12-15 DIAGNOSIS — D509 Iron deficiency anemia, unspecified: Secondary | ICD-10-CM | POA: Diagnosis not present

## 2018-12-15 DIAGNOSIS — N186 End stage renal disease: Secondary | ICD-10-CM | POA: Diagnosis not present

## 2019-01-08 DIAGNOSIS — Z992 Dependence on renal dialysis: Secondary | ICD-10-CM | POA: Diagnosis not present

## 2019-01-10 DIAGNOSIS — N186 End stage renal disease: Secondary | ICD-10-CM | POA: Diagnosis not present

## 2019-01-10 DIAGNOSIS — Z992 Dependence on renal dialysis: Secondary | ICD-10-CM | POA: Diagnosis not present

## 2019-01-12 DIAGNOSIS — Z992 Dependence on renal dialysis: Secondary | ICD-10-CM | POA: Diagnosis not present

## 2019-01-12 DIAGNOSIS — D509 Iron deficiency anemia, unspecified: Secondary | ICD-10-CM | POA: Diagnosis not present

## 2019-01-12 DIAGNOSIS — Z23 Encounter for immunization: Secondary | ICD-10-CM | POA: Diagnosis not present

## 2019-01-12 DIAGNOSIS — N186 End stage renal disease: Secondary | ICD-10-CM | POA: Diagnosis not present

## 2019-01-12 DIAGNOSIS — N2581 Secondary hyperparathyroidism of renal origin: Secondary | ICD-10-CM | POA: Diagnosis not present

## 2019-01-18 DIAGNOSIS — E291 Testicular hypofunction: Secondary | ICD-10-CM | POA: Diagnosis not present

## 2019-02-05 ENCOUNTER — Other Ambulatory Visit: Payer: Self-pay

## 2019-02-05 ENCOUNTER — Emergency Department: Payer: PPO

## 2019-02-05 ENCOUNTER — Emergency Department
Admission: EM | Admit: 2019-02-05 | Discharge: 2019-02-05 | Disposition: A | Discharge status: Home / Self Care | Payer: PPO | Attending: Emergency Medicine | Admitting: Emergency Medicine

## 2019-02-05 DIAGNOSIS — J45909 Unspecified asthma, uncomplicated: Secondary | ICD-10-CM | POA: Diagnosis not present

## 2019-02-05 DIAGNOSIS — I120 Hypertensive chronic kidney disease with stage 5 chronic kidney disease or end stage renal disease: Secondary | ICD-10-CM | POA: Diagnosis not present

## 2019-02-05 DIAGNOSIS — Z7982 Long term (current) use of aspirin: Secondary | ICD-10-CM | POA: Diagnosis not present

## 2019-02-05 DIAGNOSIS — F17210 Nicotine dependence, cigarettes, uncomplicated: Secondary | ICD-10-CM | POA: Insufficient documentation

## 2019-02-05 DIAGNOSIS — Z7984 Long term (current) use of oral hypoglycemic drugs: Secondary | ICD-10-CM | POA: Diagnosis not present

## 2019-02-05 DIAGNOSIS — M545 Low back pain: Secondary | ICD-10-CM | POA: Insufficient documentation

## 2019-02-05 DIAGNOSIS — E119 Type 2 diabetes mellitus without complications: Secondary | ICD-10-CM | POA: Diagnosis not present

## 2019-02-05 DIAGNOSIS — M25561 Pain in right knee: Secondary | ICD-10-CM | POA: Diagnosis not present

## 2019-02-05 DIAGNOSIS — Z992 Dependence on renal dialysis: Secondary | ICD-10-CM | POA: Diagnosis not present

## 2019-02-05 DIAGNOSIS — M25562 Pain in left knee: Secondary | ICD-10-CM | POA: Diagnosis not present

## 2019-02-05 DIAGNOSIS — E1122 Type 2 diabetes mellitus with diabetic chronic kidney disease: Secondary | ICD-10-CM | POA: Diagnosis not present

## 2019-02-05 DIAGNOSIS — M5441 Lumbago with sciatica, right side: Secondary | ICD-10-CM | POA: Diagnosis not present

## 2019-02-05 DIAGNOSIS — Z7902 Long term (current) use of antithrombotics/antiplatelets: Secondary | ICD-10-CM | POA: Diagnosis not present

## 2019-02-05 DIAGNOSIS — G8929 Other chronic pain: Secondary | ICD-10-CM

## 2019-02-05 DIAGNOSIS — N186 End stage renal disease: Secondary | ICD-10-CM | POA: Insufficient documentation

## 2019-02-05 MED ORDER — HYDROCODONE-ACETAMINOPHEN 5-325 MG PO TABS: 1.0000 | ORAL_TABLET | Freq: Four times a day (QID) | ORAL | 0 refills | Status: DC | PRN

## 2019-02-05 MED ORDER — LIDOCAINE 5 % EX PTCH
1.0000 | MEDICATED_PATCH | CUTANEOUS | Status: DC
  Administered 2019-02-05: 1 via TRANSDERMAL
  Filled 2019-02-05: qty 1

## 2019-02-05 MED ORDER — LIDOCAINE 5 % EX PTCH: 1.0000 | MEDICATED_PATCH | Freq: Two times a day (BID) | CUTANEOUS | 0 refills | Status: DC

## 2019-02-05 MED ORDER — OXYCODONE-ACETAMINOPHEN 7.5-325 MG PO TABS
1.0000 | ORAL_TABLET | Freq: Once | ORAL | Status: AC
  Administered 2019-02-05: 1 via ORAL
  Filled 2019-02-05: qty 1

## 2019-02-05 NOTE — Discharge Instructions (Signed)
Follow-up with your primary care provider or the orthopedic department at [facility] for any continued pain in your low back or your knees.  A Lidoderm patch was applied to your back prior to discharge and a prescription for pain medication was sent to your pharmacy.  The emergency department does not refill medications.  If any additional pain medication is required you will need to see your primary care provider or the orthopedic department.

## 2019-02-05 NOTE — ED Provider Notes (Signed)
[facility] Emergency Department Provider Note  ____________________________________________   First MD Initiated Contact with Patient 02/05/19 1708     (approximate)  I have reviewed the triage vital signs and the nursing notes.   HISTORY  Chief Complaint Back Pain and Knee Pain   HPI Donald Fernandez is a 48 y.o. male presents to the ED with complaint of low back pain.  Patient states he has a history of sciatica but has never had his back x-ray.  He denies any previous injuries to his back.  Patient states that the pain is making it difficult to walk.  He also has chronic problems with bilateral knees.  He has been seen by the orthopedist at [facility] and told that he has degenerative arthritis.  Patient states that he was recently given a steroid pack which helped until he was finished with pack.  This was again repeated with the same results of returning pain after finishing the steroid.  Patient presently is a dialysis patient.  He rates his pain as 10/10.     Past Medical History:  Diagnosis Date  . Arthritis   . Asthma    triggered by enviornmental allergies  . BPH (benign prostatic hyperplasia)   . Diabetes mellitus without complication ([place])   . Dialysis patient [facility])    dialysis T-TH-SAT  . GERD (gastroesophageal reflux disease)   . Gout   . Hyperlipidemia   . Hypertension   . Neuropathy   . Renal disorder    CKD4; end stage renal disease  . Restless legs   . Sleep apnea    uses cpap    Patient Active Problem List   Diagnosis Date Noted  . Occlusion and stenosis of basilar artery 08/24/2018  . Complication of vascular access for dialysis 10/31/2017  . ESRD on dialysis ([place]) 07/27/2017  . Angioedema 06/05/2017  . Chronic kidney disease 01/26/2017  . Pseudotumor cerebri 12/17/2016  . Essential hypertension 08/02/2016  . Diabetes mellitus type 2 in obese ([place]) 08/02/2016  . Acute on chronic renal failure ([place]) 07/09/2016     Past Surgical History:  Procedure Laterality Date  . A/V FISTULAGRAM N/A 06/08/2017   Procedure: A/V FISTULAGRAM;  Surgeon: Algernon Huxley, MD;  Location: [place] CV LAB;  Service: Cardiovascular;  Laterality: N/A;  . ANKLE SURGERY Right    fracture repair with pins, plates, screws  . AV FISTULA INSERTION W/ RF MAGNETIC GUIDANCE Left 09/02/2017   Procedure: AV FISTULA INSERTION W/RF MAGNETIC GUIDANCE;  Surgeon: Katha Cabal, MD;  Location: [place] CV LAB;  Service: Cardiovascular;  Laterality: Left;  . AV FISTULA PLACEMENT Left 08/04/2016   Procedure: ARTERIOVENOUS (AV) FISTULA CREATION ( RADIOCEPHALIC );  Surgeon: Katha Cabal, MD;  Location: [facility] ORS;  Service: Vascular;  Laterality: Left;  . DIALYSIS/PERMA CATHETER INSERTION N/A 06/06/2017   Procedure: DIALYSIS/PERMA CATHETER INSERTION;  Surgeon: Algernon Huxley, MD;  Location: [place] CV LAB;  Service: Cardiovascular;  Laterality: N/A;  . DIALYSIS/PERMA CATHETER REMOVAL N/A 05/04/2018   Procedure: DIALYSIS/PERMA CATHETER REMOVAL;  Surgeon: Algernon Huxley, MD;  Location: [place] CV LAB;  Service: Cardiovascular;  Laterality: N/A;  . IR ANGIO INTRA EXTRACRAN SEL INTERNAL CAROTID UNI R MOD SED  12/17/2016  . RADIOLOGY WITH ANESTHESIA N/A 12/17/2016   Procedure: Arteriogram/Venogram with right venous sinus manometry;  Surgeon: Consuella Lose, MD;  Location: [place];  Service: Radiology;  Laterality: N/A;  . REVISON OF ARTERIOVENOUS FISTULA Left 01/04/2018  Procedure: REVISON OF ARTERIOVENOUS FISTULA ( BRACHIALCEPHALIC );  Surgeon: Katha Cabal, MD;  Location: [facility] ORS;  Service: Vascular;  Laterality: Left;  . UPPER EXTREMITY ANGIOGRAPHY Left 10/19/2017   Procedure: UPPER EXTREMITY ANGIOGRAPHY;  Surgeon: Katha Cabal, MD;  Location: [place] CV LAB;  Service: Cardiovascular;  Laterality: Left;    Prior to Admission medications   Medication Sig Start Date End Date Taking? Authorizing Provider  allopurinol  (ZYLOPRIM) 100 MG tablet Take 100 mg by mouth daily.    [provider]  amLODipine (NORVASC) 10 MG tablet Take 5 mg by mouth daily.     [provider]  aspirin EC 81 MG tablet Take 81 mg by mouth daily.    [provider]  atorvastatin (LIPITOR) 20 MG tablet Take 20 mg by mouth daily.     [provider]  calcium acetate (PHOSLO) 667 MG capsule TAKE 2 CAPSULES BY MOUTH WITH MEALS AND 1 WITH SNACKS 01/26/18   [provider]  carvedilol (COREG) 3.125 MG tablet Take 3.125 mg by mouth daily. 02/16/18   [provider]  Cholecalciferol 1000 units tablet Take 1,000 Units by mouth daily.     [provider]  cloNIDine (CATAPRES) 0.1 MG tablet Take 1 tablet (0.1 mg total) by mouth 3 (three) times daily. 07/11/16   [name], MD  clopidogrel (PLAVIX) 75 MG tablet Take 1 tablet (75 mg total) by mouth daily. 08/24/18   Kris Hartmann, NP  doxazosin (CARDURA) 8 MG tablet Take 8 mg by mouth at bedtime.    [provider]  fluticasone (FLONASE) 50 MCG/ACT nasal spray Place 1 spray into both nostrils daily as needed for allergies or rhinitis.    [provider]  furosemide (LASIX) 40 MG tablet Take 40 mg by mouth. Takes on Tues, Thur, Sat, Sun    [provider]  gabapentin (NEURONTIN) 100 MG capsule Take 1 capsule (100 mg total) by mouth 3 (three) times daily. 07/11/16   [name], MD  glipiZIDE (GLUCOTROL XL) 2.5 MG 24 hr tablet Take 2.5 mg by mouth daily. 12/08/17   [provider]  hydrALAZINE (APRESOLINE) 100 MG tablet Take 100 mg by mouth 3 (three) times daily.  08/16/16   [provider]  HYDROcodone-acetaminophen (NORCO/VICODIN) 5-325 MG tablet Take 1 tablet by mouth every 6 (six) hours as needed for moderate pain. 02/05/19   Johnn Hai, PA-C  lidocaine (LIDODERM) 5 % Place 1 patch onto the skin every 12 (twelve) hours. Remove & Discard patch within 12 hours or as directed by MD  02/05/19 02/05/20  Johnn Hai, PA-C  tamsulosin (FLOMAX) 0.4 MG CAPS capsule Take 0.8 mg by mouth daily after breakfast.     [provider]  Testosterone (ANDROGEL) 20.25 MG/1.25GM (1.62%) GEL Place 4 Pump onto the skin daily.    [provider]  topiramate (TOPAMAX) 25 MG tablet Take 50 mg by mouth at bedtime.    [provider]  traZODone (DESYREL) 100 MG tablet Take 100 mg by mouth at bedtime.    [provider]  vitamin E 400 UNIT capsule Take 400 Units by mouth daily.    [provider]    Allergies Accupril [quinapril hcl] and Ace inhibitors  Family History  Problem Relation Age of Onset  . AAA (abdominal aortic aneurysm) Mother   . Diabetes Mother   . Heart disease Father     Social History Social History   Tobacco Use  .  Smoking status: Current Every Day Smoker    Types: Cigarettes  . Smokeless tobacco: Never Used  . Tobacco comment: APPROX. 1 PK/WEEK   Substance Use Topics  . Alcohol use: Yes    Comment: weekends  . Drug use: Yes    Types: Marijuana    Comment: LAST TIME APPROX. 08/03/17    Review of Systems Constitutional: No fever/chills Cardiovascular: Denies chest pain. Respiratory: Denies shortness of breath. Gastrointestinal: No abdominal pain.  No nausea, no vomiting.  No diarrhea. Genitourinary: Negative for dysuria. Musculoskeletal: Positive for low back pain.  Positive for chronic bilateral knee pain. Skin: Negative for rash. Neurological: Negative for headaches, focal weakness or numbness. ___________________________________________   PHYSICAL EXAM:  VITAL SIGNS: ED Triage Vitals  Enc Vitals Group     BP 02/05/19 1635 92/75     Pulse Rate 02/05/19 1635 (!) 130     Resp 02/05/19 1635 20     Temp 02/05/19 1635 98.4 F (36.9 C)     Temp Source 02/05/19 1635 Oral     SpO2 02/05/19 1635 99 %     Weight 02/05/19 1640 (!) 315 lb (142.9 kg)     Height 02/05/19 1640 5\' 11"  (1.803 m)     Head  Circumference --      Peak Flow --      Pain Score 02/05/19 1640 10     Pain Loc --      Pain Edu? --      Excl. in [place]? --    Constitutional: Alert and oriented. Well appearing and in no acute distress. Eyes: Conjunctivae are normal.  Head: Atraumatic. Neck: No stridor.   Cardiovascular: Normal rate, regular rhythm. Grossly normal heart sounds.  Good peripheral circulation. Respiratory: Normal respiratory effort.  No retractions. Lungs CTAB. Gastrointestinal: Soft and nontender. No distention. No CVA tenderness. Musculoskeletal: On examination of the back there is no gross deformity.  Large body habitus.  There is some tenderness on palpation of the L5-S1 area and paravertebral muscles bilaterally.  Patient is nontender to palpation of the thoracic or upper lumbar area.  Range of motion is slow and guarded.  Good muscle strength bilaterally. Neurologic:  Normal speech and language. No gross focal neurologic deficits are appreciated. No gait instability. Skin:  Skin is warm, dry and intact. No rash noted. Psychiatric: Mood and affect are normal. Speech and behavior are normal.  ____________________________________________   LABS (all labs ordered are listed, but only abnormal results are displayed)  Labs Reviewed - No data to display RADIOLOGY  Official radiology report(s): Dg Lumbar Spine 2-3 Views  Result Date: 02/05/2019 CLINICAL DATA:  Low back pain, no injury, pain to buttocks bilaterally up to mid back, history end-stage renal disease on dialysis, diabetes mellitus, hypertension, GERD, gout, neuropathy EXAM: LUMBAR SPINE - 2-3 VIEW COMPARISON:  None FINDINGS: 5 non-rib-bearing lumbar vertebra. Vertebral body and disc space heights maintained. Osseous mineralization grossly normal. No fracture, subluxation, bone destruction or spondylolysis. SI joints appear expanded, sclerotic and ill-defined consistent with sacroiliitis. Aortic atherosclerosis. IMPRESSION: No acute lumbar spine  abnormalities. BILATERAL sacroiliitis. Electronically Signed   By: Lavonia Dana M.D.   On: 02/05/2019 18:32    ____________________________________________   PROCEDURES  Procedure(s) performed (including Critical Care):  Procedures  ____________________________________________   INITIAL IMPRESSION / ASSESSMENT AND PLAN / ED COURSE  As part of my medical decision making, I reviewed the following data within the electronic MEDICAL RECORD NUMBER Notes from prior ED visits and  Controlled Substance Database  48 year old male presents to the ED with complaint of chronic bilateral knee pain and low back pain with right leg radiculopathy.  Patient has a history of sciatica.  He denies any recent injury to explain the exacerbation.  He states that he was prescribed to prednisone packs back to back with improvement of his symptoms until he finished each pack.  [facility] for his knee pain.  In looking through his record it appears that he called requesting pain medication 3 days ago.  This was denied as he has not been seen there since the first of the year.  Physical exam is consistent with low back pain with right leg sciatica however he is neurologically intact.  X-rays of the lumbar spine did not show any acute changes.  Patient was encouraged to follow-up with the orthopedic department or his PCP.  A Lidoderm patch was placed on his back while in the ED and 1 Percocet p.o.  A prescription for Lidoderm patches and Norco was sent to his pharmacy. ____________________________________________   FINAL CLINICAL IMPRESSION(S) / ED DIAGNOSES  Final diagnoses:  Chronic bilateral low back pain with right-sided sciatica  Chronic pain of both knees     ED Discharge Orders         Ordered    lidocaine (LIDODERM) 5 %  Every 12 hours     02/05/19 1854    HYDROcodone-acetaminophen (NORCO/VICODIN) 5-325 MG tablet  Every 6 hours PRN     02/05/19 1854            Note:  This document was prepared using Dragon voice recognition software and may include unintentional dictation errors.    Johnn Hai, PA-C 02/05/19 1903    Vanessa Lindale, MD 02/05/19 2138

## 2019-02-05 NOTE — ED Triage Notes (Addendum)
Pt comes via POV from home with c/o lower back pain. The pt also c/o bilateral knee pain. Pt states throbbing in knees.  Pt states pain to his buttocks bilaterally to up to middle of back. Pt states he is having trouble sleep because of the pain. Pt states he was told it was his sciatic nerve.  Pt states hx of gout. Pt denies any CP or SOB.

## 2019-02-05 NOTE — ED Notes (Signed)
See triage note presents with lower back pain  States he has a hx of sciatica   Was recently on prednisone and pain returned after meds stopped

## 2019-02-10 DIAGNOSIS — Z992 Dependence on renal dialysis: Secondary | ICD-10-CM | POA: Diagnosis not present

## 2019-02-10 DIAGNOSIS — N186 End stage renal disease: Secondary | ICD-10-CM | POA: Diagnosis not present

## 2019-02-12 DIAGNOSIS — N186 End stage renal disease: Secondary | ICD-10-CM | POA: Diagnosis not present

## 2019-02-12 DIAGNOSIS — Z992 Dependence on renal dialysis: Secondary | ICD-10-CM | POA: Diagnosis not present

## 2019-02-12 DIAGNOSIS — N2581 Secondary hyperparathyroidism of renal origin: Secondary | ICD-10-CM | POA: Diagnosis not present

## 2019-02-12 DIAGNOSIS — D509 Iron deficiency anemia, unspecified: Secondary | ICD-10-CM | POA: Diagnosis not present

## 2019-02-20 DIAGNOSIS — M170 Bilateral primary osteoarthritis of knee: Secondary | ICD-10-CM | POA: Diagnosis not present

## 2019-02-27 DIAGNOSIS — M461 Sacroiliitis, not elsewhere classified: Secondary | ICD-10-CM | POA: Diagnosis not present

## 2019-03-01 ENCOUNTER — Other Ambulatory Visit: Payer: Self-pay | Admitting: Family Medicine

## 2019-03-01 ENCOUNTER — Encounter (INDEPENDENT_AMBULATORY_CARE_PROVIDER_SITE_OTHER): Payer: PPO

## 2019-03-01 ENCOUNTER — Ambulatory Visit (INDEPENDENT_AMBULATORY_CARE_PROVIDER_SITE_OTHER): Payer: PPO | Admitting: Vascular Surgery

## 2019-03-01 DIAGNOSIS — G8929 Other chronic pain: Secondary | ICD-10-CM

## 2019-03-04 DIAGNOSIS — Z992 Dependence on renal dialysis: Secondary | ICD-10-CM | POA: Diagnosis not present

## 2019-03-06 ENCOUNTER — Other Ambulatory Visit: Payer: Self-pay

## 2019-03-06 ENCOUNTER — Ambulatory Visit
Admission: RE | Admit: 2019-03-06 | Discharge: 2019-03-06 | Disposition: A | Discharge status: Home / Self Care | Payer: PPO | Source: Ambulatory Visit | Attending: Family Medicine | Admitting: Family Medicine

## 2019-03-06 DIAGNOSIS — M5442 Lumbago with sciatica, left side: Secondary | ICD-10-CM | POA: Diagnosis not present

## 2019-03-06 DIAGNOSIS — M5441 Lumbago with sciatica, right side: Secondary | ICD-10-CM | POA: Insufficient documentation

## 2019-03-06 DIAGNOSIS — G8929 Other chronic pain: Secondary | ICD-10-CM | POA: Diagnosis not present

## 2019-03-06 DIAGNOSIS — M545 Low back pain: Secondary | ICD-10-CM | POA: Diagnosis not present

## 2019-03-12 DIAGNOSIS — N186 End stage renal disease: Secondary | ICD-10-CM | POA: Diagnosis not present

## 2019-03-12 DIAGNOSIS — Z992 Dependence on renal dialysis: Secondary | ICD-10-CM | POA: Diagnosis not present

## 2019-03-14 DIAGNOSIS — N186 End stage renal disease: Secondary | ICD-10-CM | POA: Diagnosis not present

## 2019-03-14 DIAGNOSIS — Z992 Dependence on renal dialysis: Secondary | ICD-10-CM | POA: Diagnosis not present

## 2019-03-14 DIAGNOSIS — Z23 Encounter for immunization: Secondary | ICD-10-CM | POA: Diagnosis not present

## 2019-03-14 DIAGNOSIS — N2581 Secondary hyperparathyroidism of renal origin: Secondary | ICD-10-CM | POA: Diagnosis not present

## 2019-03-15 ENCOUNTER — Encounter (INDEPENDENT_AMBULATORY_CARE_PROVIDER_SITE_OTHER): Payer: Self-pay | Admitting: Vascular Surgery

## 2019-03-15 ENCOUNTER — Ambulatory Visit (INDEPENDENT_AMBULATORY_CARE_PROVIDER_SITE_OTHER): Payer: PPO | Admitting: Vascular Surgery

## 2019-03-15 ENCOUNTER — Other Ambulatory Visit: Payer: Self-pay

## 2019-03-15 ENCOUNTER — Ambulatory Visit (INDEPENDENT_AMBULATORY_CARE_PROVIDER_SITE_OTHER): Payer: PPO

## 2019-03-15 VITALS — BP 75/50 | HR 113 | Resp 16

## 2019-03-15 DIAGNOSIS — Z992 Dependence on renal dialysis: Secondary | ICD-10-CM | POA: Diagnosis not present

## 2019-03-15 DIAGNOSIS — N186 End stage renal disease: Secondary | ICD-10-CM | POA: Diagnosis not present

## 2019-03-15 DIAGNOSIS — T829XXS Unspecified complication of cardiac and vascular prosthetic device, implant and graft, sequela: Secondary | ICD-10-CM | POA: Diagnosis not present

## 2019-03-15 DIAGNOSIS — D649 Anemia, unspecified: Secondary | ICD-10-CM | POA: Insufficient documentation

## 2019-03-15 DIAGNOSIS — K219 Gastro-esophageal reflux disease without esophagitis: Secondary | ICD-10-CM

## 2019-03-15 DIAGNOSIS — I10 Essential (primary) hypertension: Secondary | ICD-10-CM

## 2019-03-15 NOTE — Progress Notes (Signed)
MRN : [id number]  Donald Fernandez is a 48 y.o. (November 27, 1970) male who presents with chief complaint of  Chief Complaint  Patient presents with   Follow-up    2month ultrasound follow up  .  History of Present Illness:   The patient returns to the office for followup status post superficialization of the dialysis access 01/04/2018.  The patient denies an increase in arm swelling. At the present time the patient denies hand pain.  The patient denies amaurosis fugax or recent TIA symptoms. There are no recent neurological changes noted. The patient denies claudication symptoms or rest pain symptoms. The patient denies history of DVT, PE or superficial thrombophlebitis. The patient denies recent episodes of angina or shortness of breath.   Duplex ultrasound of the AV access shows a patent access with uniform velocities.  No focal hemodynamically significant stenosis.    Current Meds  Medication Sig   allopurinol (ZYLOPRIM) 100 MG tablet Take 100 mg by mouth daily.   amLODipine (NORVASC) 5 MG tablet Take 5 mg by mouth daily.   anastrozole (ARIMIDEX) 1 MG tablet Take 1 mg by mouth daily.   aspirin EC 81 MG tablet Take 81 mg by mouth daily.   atorvastatin (LIPITOR) 20 MG tablet Take 20 mg by mouth daily.    azelastine (ASTELIN) 0.1 % nasal spray USE 2 SPRAY(S) IN EACH NOSTRIL TWICE DAILY   calcium acetate (PHOSLO) 667 MG capsule TAKE 2 CAPSULES BY MOUTH WITH MEALS AND 1 WITH SNACKS   carvedilol (COREG) 3.125 MG tablet Take 3.125 mg by mouth daily.   Cholecalciferol 1000 units tablet Take 1,000 Units by mouth daily.    clomiPHENE (CLOMID) 50 MG tablet SMARTSIG:.5 Tablet(s) By Mouth Daily   cloNIDine (CATAPRES) 0.1 MG tablet Take 1 tablet (0.1 mg total) by mouth 3 (three) times daily.   clopidogrel (PLAVIX) 75 MG tablet Take 1 tablet (75 mg total) by mouth daily.   cyclobenzaprine (FLEXERIL) 10 MG tablet Take 10 mg by mouth 3 (three) times daily as needed.   famotidine  (PEPCID) 20 MG tablet Take 20 mg by mouth 2 (two) times daily.   fluticasone (FLONASE) 50 MCG/ACT nasal spray Place 1 spray into both nostrils daily as needed for allergies or rhinitis.   furosemide (LASIX) 40 MG tablet Take 40 mg by mouth. Takes on Tues, Thur, Sat, Sun   gabapentin (NEURONTIN) 100 MG capsule Take 1 capsule (100 mg total) by mouth 3 (three) times daily.   HYDROcodone-acetaminophen (NORCO/VICODIN) 5-325 MG tablet Take 1 tablet by mouth every 6 (six) hours as needed for moderate pain.   lidocaine (LIDODERM) 5 % Place 1 patch onto the skin every 12 (twelve) hours. Remove & Discard patch within 12 hours or as directed by MD   methocarbamol (ROBAXIN) 500 MG tablet SMARTSIG:1-2.5 Tablet(s) By Mouth Twice Daily PRN   montelukast (SINGULAIR) 10 MG tablet Take 10 mg by mouth daily.   oxyCODONE (OXY IR/ROXICODONE) 5 MG immediate release tablet Take 5 mg by mouth every 6 (six) hours as needed.   predniSONE (DELTASONE) 10 MG tablet TAKE 5 TABLETS BY MOUTH ON DAY ONE. DECREASE BY 1 TABLET EACH DAY UNTIL GONE (5 4 3 2  1).   sodium bicarbonate 325 MG tablet Take by mouth.   tadalafil (CIALIS) 5 MG tablet Take 5 mg by mouth daily.   tamsulosin (FLOMAX) 0.4 MG CAPS capsule Take 0.8 mg by mouth daily after breakfast.    vitamin E 400 UNIT capsule Take 400 Units by  mouth daily.    Past Medical History:  Diagnosis Date   Arthritis    Asthma    triggered by enviornmental allergies   BPH (benign prostatic hyperplasia)    Diabetes mellitus without complication ([place])    Dialysis patient ([place])    dialysis T-TH-SAT   GERD (gastroesophageal reflux disease)    Gout    Hyperlipidemia    Hypertension    Neuropathy    Renal disorder    CKD4; end stage renal disease   Restless legs    Sleep apnea    uses cpap    Past Surgical History:  Procedure Laterality Date   A/V FISTULAGRAM N/A 06/08/2017   Procedure: A/V FISTULAGRAM;  Surgeon: Algernon Huxley, MD;  Location: [facility];  Service: Cardiovascular;  Laterality: N/A;   ANKLE SURGERY Right    fracture repair with pins, plates, screws   AV FISTULA INSERTION W/ RF MAGNETIC GUIDANCE Left 09/02/2017   Procedure: AV FISTULA INSERTION W/RF MAGNETIC GUIDANCE;  Surgeon: Katha Cabal, MD;  Location: [facility];  Service: Cardiovascular;  Laterality: Left;   AV FISTULA PLACEMENT Left 08/04/2016   Procedure: ARTERIOVENOUS (AV) FISTULA CREATION ( RADIOCEPHALIC );  Surgeon: Katha Cabal, MD;  Location: [facility];  Service: Vascular;  Laterality: Left;   DIALYSIS/PERMA CATHETER INSERTION N/A 06/06/2017   Procedure: DIALYSIS/PERMA CATHETER INSERTION;  Surgeon: Algernon Huxley, MD;  Location: [facility];  Service: Cardiovascular;  Laterality: N/A;   DIALYSIS/PERMA CATHETER REMOVAL N/A 05/04/2018   Procedure: DIALYSIS/PERMA CATHETER REMOVAL;  Surgeon: Algernon Huxley, MD;  Location: [facility] [facility];  Service: Cardiovascular;  Laterality: N/A;   IR ANGIO INTRA EXTRACRAN SEL INTERNAL CAROTID UNI R MOD SED  12/17/2016   RADIOLOGY WITH ANESTHESIA N/A 12/17/2016   Procedure: Arteriogram/Venogram with right venous sinus manometry;  Surgeon: Consuella Lose, MD;  Location: [facility];  Service: Radiology;  Laterality: N/A;   REVISON OF ARTERIOVENOUS FISTULA Left 01/04/2018   Procedure: REVISON OF ARTERIOVENOUS FISTULA ( BRACHIALCEPHALIC );  Surgeon: Katha Cabal, MD;  Location: [facility];  Service: Vascular;  Laterality: Left;   UPPER EXTREMITY ANGIOGRAPHY Left 10/19/2017   Procedure: UPPER EXTREMITY ANGIOGRAPHY;  Surgeon: Katha Cabal, MD;  Location: [facility];  Service: Cardiovascular;  Laterality: Left;    Social History Social History   Tobacco Use   Smoking status: Current Every Day Smoker    Types: Cigarettes   Smokeless tobacco: Never Used   Tobacco comment: APPROX. 1 PK/WEEK   Substance Use Topics   Alcohol use: Yes    Comment: weekends   Drug use: Yes      Types: Marijuana    Comment: LAST TIME APPROX. 08/03/17    Family History Family History  Problem Relation Age of Onset   AAA (abdominal aortic aneurysm) Mother    Diabetes Mother    Heart disease Father     Allergies  Allergen Reactions   Accupril [Quinapril Hcl] Anaphylaxis    Angioedema   Ace Inhibitors Anaphylaxis    Angioedema     REVIEW OF SYSTEMS (Negative unless checked)  Constitutional: [] Weight loss  [] Fever  [] Chills Cardiac: [] Chest pain   [] Chest pressure   [] Palpitations   [] Shortness of breath when laying flat   [] Shortness of breath with exertion. Vascular:  [] Pain in legs with walking   [] Pain in legs at rest  [] History of DVT   [] Phlebitis   [] Swelling in legs   [] Varicose veins   []   Non-healing ulcers Pulmonary:   [] Uses home oxygen   [] Productive cough   [] Hemoptysis   [] Wheeze  [] COPD   [] Asthma Neurologic:  [] Dizziness   [] Seizures   [] History of stroke   [] History of TIA  [] Aphasia   [] Vissual changes   [] Weakness or numbness in arm   [] Weakness or numbness in leg Musculoskeletal:   [] Joint swelling   [] Joint pain   [] Low back pain Hematologic:  [] Easy bruising  [] Easy bleeding   [] Hypercoagulable state   [] Anemic Gastrointestinal:  [] Diarrhea   [] Vomiting  [] Gastroesophageal reflux/heartburn   [] Difficulty swallowing. Genitourinary:  [x] Chronic kidney disease   [] Difficult urination  [] Frequent urination   [] Blood in urine Skin:  [] Rashes   [] Ulcers  Psychological:  [] History of anxiety   []  History of major depression.  Physical Examination  Vitals:   03/15/19 1541  BP: (!) 75/50  Pulse: (!) 113  Resp: 16   There is no height or weight on file to calculate BMI. Gen: WD/WN, NAD Head: [place]/AT, No temporalis wasting.  Ear/Nose/Throat: Hearing grossly intact, nares w/o erythema or drainage Eyes: PER, EOMI, sclera nonicteric.  Neck: Supple, no large masses.   Pulmonary:  Good air movement, no audible wheezing bilaterally, no use of accessory  muscles.  Cardiac: RRR, no JVD Vascular: left brachial cephalic fistula good thrill good bruit Vessel Right Left  Radial Palpable Palpable  Brachial Palpable Palpable  Gastrointestinal: Non-distended. No guarding/no peritoneal signs.  Musculoskeletal: M/S 5/5 throughout.  No deformity or atrophy.  Neurologic: CN 2-12 intact. Symmetrical.  Speech is fluent. Motor exam as listed above. Psychiatric: Judgment intact, Mood & affect appropriate for pt's clinical situation. Dermatologic: No rashes or ulcers noted.  No changes consistent with cellulitis. Lymph : No lichenification or skin changes of chronic lymphedema.  CBC Lab Results  Component Value Date   WBC 7.9 12/14/2017   HGB 12.6 (L) 01/04/2018   HCT 37.0 (L) 01/04/2018   MCV 93.5 12/14/2017   PLT 247 12/14/2017    BMET    Component Value Date/Time   NA 140 01/04/2018 0628   NA 145 12/31/2011 1850   K 3.5 01/04/2018 0628   K 3.5 12/31/2011 1850   CL 100 12/14/2017 1400   CL 108 (H) 12/31/2011 1850   CO2 22 12/14/2017 1400   CO2 28 12/31/2011 1850   GLUCOSE 97 01/04/2018 0628   GLUCOSE 63 (L) 12/31/2011 1850   BUN 52 (H) 12/14/2017 1400   BUN 14 12/31/2011 1850   CREATININE 12.01 (H) 12/14/2017 1400   CREATININE 1.72 (H) 12/31/2011 1850   CALCIUM 8.4 (L) 12/14/2017 1400   CALCIUM 8.1 (L) 06/06/2017 2038   GFRNONAA 4 (L) 12/14/2017 1400   GFRNONAA 48 (L) 12/31/2011 1850   GFRAA 5 (L) 12/14/2017 1400   GFRAA 55 (L) 12/31/2011 1850   CrCl cannot be calculated (Patient's most recent lab result is older than the maximum 21 days allowed.).  COAG Lab Results  Component Value Date   INR 0.88 12/14/2017   INR 0.90 09/02/2017   INR 0.91 08/08/2017    Radiology Mr Lumbar Spine Wo Contrast  Result Date: 03/06/2019 CLINICAL DATA:  48 year old male with 2 years of low back pain radiating to the bilateral hips and legs. No known injury. EXAM: MRI LUMBAR SPINE WITHOUT CONTRAST TECHNIQUE: Multiplanar, multisequence MR  imaging of the lumbar spine was performed. No intravenous contrast was administered. COMPARISON:  Lumbar radiographs 02/05/2019. FINDINGS: Segmentation:  Normal on the comparison radiographs. Alignment: Preserved lumbar lordosis. Subtle retrolisthesis  of L5 on S1. Vertebrae: Degenerative superior endplate Schmorl's nodes associated with mild marrow edema at the L3, L4 and L5 endplates (series 7 image 9). Similar mild inferior endplate Schmorl's node edema at T12. Normal background bone marrow signal. Intact visible sacrum and SI joints. No other acute osseous abnormality identified. Conus medullaris and cauda equina: Conus extends to the L1-L2 level. No lower spinal cord or conus signal abnormality. Paraspinal and other soft tissues: A degree of bilateral renal atrophy is suspected (series 8, image 11). Ectatic abdominal aorta up to 25 millimeters diameter. Other visualized abdominal viscera and paraspinal soft tissues are within normal limits. Disc levels: T12-L1:  Mild disc space loss with minimal disc bulge. No stenosis. L1-L2:  Negative. L2-L3: Mild mostly far lateral disc bulging and endplate spurring. Borderline to mild posterior element hypertrophy. No stenosis. L3-L4: Mild far lateral disc bulge and endplate spurring. Mild posterior element hypertrophy. Borderline to mild bilateral L3 foraminal stenosis. L4-L5: Mild far lateral disc bulging and endplate spurring. Mild posterior element hypertrophy. Borderline to mild bilateral L4 foraminal stenosis. L5-S1: Mild circumferential disc bulge and endplate spurring. Moderate facet and mild ligament flavum hypertrophy. Degenerative facet joint fluid greater on the right. Mild epidural lipomatosis. No spinal or lateral recess stenosis. Mild to moderate left and mild right L5 foraminal stenosis. IMPRESSION: 1. Degenerative vertebral edema associated with superior endplate Schmorl's nodes at T12, L3, L4 and L5. No other acute osseous abnormality. 2. Mild for age  lumbar spine degeneration with no spinal or lateral recess stenosis. Up to mild multilevel neural foraminal stenosis related to disc bulging and posterior element hypertrophy. Moderate left L5 neural foraminal stenosis in part due to moderate facet arthropathy. 3. Ectatic abdominal aorta at risk for aneurysm development. Recommend followup by ultrasound in 5 years. This recommendation follows ACR consensus guidelines: White Paper of the ACR Incidental Findings Committee II on Vascular Findings. J Am Coll Radiol 2013; 10:789-794. Aortic aneurysm NOS (ICD10-I71.9) Electronically Signed   By: Genevie Ann M.D.   On: 03/06/2019 10:29     Assessment/Plan 1. ESRD on hemodialysis ([place]) At the present time the patient has adequate dialysis access.  Continue hemodialysis as ordered without interruption.  Avoid nephrotoxic medications and dehydration.  Further plans per nephrology  2. Complication of vascular access for dialysis, sequela Recommend:  The patient is doing well and currently has adequate dialysis access. The patient's dialysis center is not reporting any access issues. Flow pattern is stable when compared to the prior ultrasound.  The patient should have a duplex ultrasound of the dialysis access in 6 months. The patient will follow-up with me in the office after each ultrasound    - DIALYSIS ACCESS (AVF; Future  3. Essential hypertension Continue antihypertensive medications as already ordered, these medications have been reviewed and there are no changes at this time.   4. Gastroesophageal reflux disease without esophagitis Continue PPI as already ordered, this medication has been reviewed and there are no changes at this time.  Avoidence of caffeine and alcohol  Moderate elevation of the head of the bed     Hortencia Pilar, MD  03/15/2019 3:55 PM

## 2019-03-16 ENCOUNTER — Emergency Department: Payer: PPO

## 2019-03-16 ENCOUNTER — Encounter: Payer: Self-pay | Admitting: Emergency Medicine

## 2019-03-16 ENCOUNTER — Emergency Department
Admission: EM | Admit: 2019-03-16 | Discharge: 2019-03-16 | Disposition: A | Discharge status: Home / Self Care | Payer: PPO | Attending: Emergency Medicine | Admitting: Emergency Medicine

## 2019-03-16 DIAGNOSIS — Z992 Dependence on renal dialysis: Secondary | ICD-10-CM | POA: Insufficient documentation

## 2019-03-16 DIAGNOSIS — I120 Hypertensive chronic kidney disease with stage 5 chronic kidney disease or end stage renal disease: Secondary | ICD-10-CM | POA: Insufficient documentation

## 2019-03-16 DIAGNOSIS — Z7984 Long term (current) use of oral hypoglycemic drugs: Secondary | ICD-10-CM | POA: Diagnosis not present

## 2019-03-16 DIAGNOSIS — E1122 Type 2 diabetes mellitus with diabetic chronic kidney disease: Secondary | ICD-10-CM | POA: Insufficient documentation

## 2019-03-16 DIAGNOSIS — E1140 Type 2 diabetes mellitus with diabetic neuropathy, unspecified: Secondary | ICD-10-CM | POA: Diagnosis not present

## 2019-03-16 DIAGNOSIS — Z79899 Other long term (current) drug therapy: Secondary | ICD-10-CM | POA: Insufficient documentation

## 2019-03-16 DIAGNOSIS — N186 End stage renal disease: Secondary | ICD-10-CM | POA: Diagnosis not present

## 2019-03-16 DIAGNOSIS — G8929 Other chronic pain: Secondary | ICD-10-CM | POA: Diagnosis not present

## 2019-03-16 DIAGNOSIS — Z7982 Long term (current) use of aspirin: Secondary | ICD-10-CM | POA: Insufficient documentation

## 2019-03-16 DIAGNOSIS — M25551 Pain in right hip: Secondary | ICD-10-CM | POA: Diagnosis not present

## 2019-03-16 DIAGNOSIS — R52 Pain, unspecified: Secondary | ICD-10-CM | POA: Diagnosis not present

## 2019-03-16 DIAGNOSIS — M25552 Pain in left hip: Secondary | ICD-10-CM | POA: Diagnosis not present

## 2019-03-16 DIAGNOSIS — F17210 Nicotine dependence, cigarettes, uncomplicated: Secondary | ICD-10-CM | POA: Insufficient documentation

## 2019-03-16 DIAGNOSIS — M160 Bilateral primary osteoarthritis of hip: Secondary | ICD-10-CM | POA: Diagnosis not present

## 2019-03-16 DIAGNOSIS — F1210 Cannabis abuse, uncomplicated: Secondary | ICD-10-CM | POA: Insufficient documentation

## 2019-03-16 DIAGNOSIS — J45909 Unspecified asthma, uncomplicated: Secondary | ICD-10-CM | POA: Insufficient documentation

## 2019-03-16 MED ORDER — OXYCODONE-ACETAMINOPHEN 5-325 MG PO TABS
2.0000 | ORAL_TABLET | Freq: Once | ORAL | Status: AC
  Administered 2019-03-16: 2 via ORAL
  Filled 2019-03-16: qty 2

## 2019-03-16 MED ORDER — ONDANSETRON HCL 4 MG/2ML IJ SOLN
4.0000 mg | Freq: Once | INTRAMUSCULAR | Status: AC
  Administered 2019-03-16: 4 mg via INTRAVENOUS
  Filled 2019-03-16: qty 2

## 2019-03-16 MED ORDER — ORPHENADRINE CITRATE 30 MG/ML IJ SOLN
60.0000 mg | Freq: Two times a day (BID) | INTRAMUSCULAR | Status: DC
  Administered 2019-03-16: 12:00:00 60 mg via INTRAVENOUS
  Filled 2019-03-16: qty 2

## 2019-03-16 MED ORDER — DEXAMETHASONE SODIUM PHOSPHATE 10 MG/ML IJ SOLN
10.0000 mg | Freq: Once | INTRAMUSCULAR | Status: AC
  Administered 2019-03-16: 10 mg via INTRAVENOUS
  Filled 2019-03-16: qty 1

## 2019-03-16 MED ORDER — LIDOCAINE 5 % EX PTCH
1.0000 | MEDICATED_PATCH | CUTANEOUS | Status: DC
  Administered 2019-03-16: 1 via TRANSDERMAL
  Filled 2019-03-16: qty 1

## 2019-03-16 MED ORDER — PREDNISONE 10 MG (48) PO TBPK: ORAL_TABLET | ORAL | 0 refills | Status: DC

## 2019-03-16 MED ORDER — OXYCODONE-ACETAMINOPHEN 5-325 MG PO TABS: 2.0000 | ORAL_TABLET | Freq: Four times a day (QID) | ORAL | 0 refills | Status: DC | PRN

## 2019-03-16 MED ORDER — MORPHINE SULFATE (PF) 4 MG/ML IV SOLN
4.0000 mg | Freq: Once | INTRAVENOUS | Status: AC
  Administered 2019-03-16: 4 mg via INTRAVENOUS
  Filled 2019-03-16: qty 1

## 2019-03-16 NOTE — TOC Initial Note (Signed)
Transition of Care [facility]) - Initial/Assessment Note    Patient Details  Name: Donald Fernandez MRN: [id number] Date of Birth: [birth year]  Transition [facility]) CM/SW Contact:    Anselm Pancoast, RN Phone Number: 03/16/2019, 12:36 PM  Clinical Narrative:                 48 year old male admitted with severe bilateral hip pain states he has been struggling for months for relief with no results. Had MRI one week ago and numerous appointments including injections in both knees. Patient has dialysis on Monday, Wednesday and Friday which the bus takes him to. Patient is frustrated and stressed about increasing medical bills and no improvement in pain or symptoms. Patient was able to afford medication this month however had to borrow money from his mother for food. Lives home with his adult mother. Patient states he hates having to call EMS and coming to the hospital but the pain is unbearable and he is not able to walk short distances without severe pain.   Expected Discharge Plan: Home/Self Care     Patient Goals and CMS Choice Patient states their goals for this hospitalization and ongoing recovery are:: Find out whh hips are so painful and get some relief      Expected Discharge Plan and Services Expected Discharge Plan: Home/Self Care       Living arrangements for the past 2 months: Single Family Home                                      Prior Living Arrangements/Services Living arrangements for the past 2 months: Single Family Home Lives with:: Parents Patient language and need for interpreter reviewed:: Yes Do you feel safe going back to the place where you live?: Yes      Need for Family Participation in Patient Care: Yes (Comment) Care giver support system in place?: Yes (comment) Current home services: DME([facility]) Criminal Activity/Legal Involvement Pertinent to Current Situation/Hospitalization: No - Comment as needed  Activities of Daily Living       Permission Sought/Granted Permission sought to share information with : Case Manager Permission granted to share information with : Yes, Verbal Permission Granted              Emotional Assessment Appearance:: Appears stated age Attitude/Demeanor/Rapport: Engaged Affect (typically observed): Accepting Orientation: : Oriented to Self, Oriented to Place, Oriented to  Time, Oriented to Situation Alcohol / Substance Use: Never Used Psych Involvement: No (comment)  Admission diagnosis:  Hip Pain-EMS Patient Active Problem List   Diagnosis Date Noted  . Anemia 03/15/2019  . Gout 03/15/2019  . Occlusion and stenosis of basilar artery 08/24/2018  . ESRD on hemodialysis ([place]) 06/07/2018  . Gastroesophageal reflux disease without esophagitis 06/07/2018  . Personal history of gout 06/07/2018  . Small vessel disease, cerebrovascular 06/07/2018  . Complication of vascular access for dialysis 10/31/2017  . ESRD on dialysis ([place]) 07/27/2017  . Angioedema 06/05/2017  . Chronic kidney disease 01/26/2017  . Pseudotumor cerebri 12/17/2016  . Essential hypertension 08/02/2016  . Diabetes mellitus type 2 in obese ([place]) 08/02/2016  . Papilledema 07/30/2016  . Severe uncontrolled hypertension 07/30/2016  . Acute on chronic renal failure ([place]) 07/09/2016  . OSA (obstructive sleep apnea) 11/27/2014   PCP:  Denton Lank, MD Pharmacy:   [facility] [place] [address] [place] [address] Phone: [phone] Fax: [phone]     Social Determinants of Health (SDOH) Interventions    Readmission Risk Interventions No flowsheet data found.

## 2019-03-16 NOTE — ED Provider Notes (Signed)
[facility] Emergency Department Provider Note  ____________________________________________   First MD Initiated Contact with Patient 03/16/19 1011     (approximate)  I have reviewed the triage vital signs and the nursing notes.   HISTORY  Chief Complaint Hip Pain    HPI Donald Fernandez is a 48 y.o. male presents emergency department complaint of bilateral hip pain which increases with ambulation.  Patient states he has been seen at [facility] clinic for his back pain.  They did an MRI last week.  He states he is being referred to a pain clinic.  His regular doctor gave him oxycodone since the hydrocodone was not working.  States he took 3 of those pills yesterday and it did help with his pain.  States that previously his dialysis physician has given him prednisone which did help and he could move around.  He is scheduled for dialysis today at 1130.  However he states he could not stand up and was not able to go.  No known injury.  Patient states his pain is a 12.    Past Medical History:  Diagnosis Date  . Arthritis   . Asthma    triggered by enviornmental allergies  . BPH (benign prostatic hyperplasia)   . Diabetes mellitus without complication ([place])   . Dialysis patient [facility])    dialysis T-TH-SAT  . GERD (gastroesophageal reflux disease)   . Gout   . Hyperlipidemia   . Hypertension   . Neuropathy   . Renal disorder    CKD4; end stage renal disease  . Restless legs   . Sleep apnea    uses cpap    Patient Active Problem List   Diagnosis Date Noted  . Anemia 03/15/2019  . Gout 03/15/2019  . Occlusion and stenosis of basilar artery 08/24/2018  . ESRD on hemodialysis ([place]) 06/07/2018  . Gastroesophageal reflux disease without esophagitis 06/07/2018  . Personal history of gout 06/07/2018  . Small vessel disease, cerebrovascular 06/07/2018  . Complication of vascular access for dialysis 10/31/2017  . ESRD on dialysis ([place]) 07/27/2017  .  Angioedema 06/05/2017  . Chronic kidney disease 01/26/2017  . Pseudotumor cerebri 12/17/2016  . Essential hypertension 08/02/2016  . Diabetes mellitus type 2 in obese ([place]) 08/02/2016  . Papilledema 07/30/2016  . Severe uncontrolled hypertension 07/30/2016  . Acute on chronic renal failure ([place]) 07/09/2016  . OSA (obstructive sleep apnea) 11/27/2014    Past Surgical History:  Procedure Laterality Date  . A/V FISTULAGRAM N/A 06/08/2017   Procedure: A/V FISTULAGRAM;  Surgeon: Algernon Huxley, MD;  Location: [place] [facility];  Service: Cardiovascular;  Laterality: N/A;  . ANKLE SURGERY Right    fracture repair with pins, plates, screws  . AV FISTULA INSERTION W/ RF MAGNETIC GUIDANCE Left 09/02/2017   Procedure: AV FISTULA INSERTION W/RF MAGNETIC GUIDANCE;  Surgeon: Katha Cabal, MD;  Location: [place] [facility];  Service: Cardiovascular;  Laterality: Left;  . AV FISTULA PLACEMENT Left 08/04/2016   Procedure: ARTERIOVENOUS (AV) FISTULA CREATION ( RADIOCEPHALIC );  Surgeon: Katha Cabal, MD;  Location: [facility] ORS;  Service: Vascular;  Laterality: Left;  . DIALYSIS/PERMA CATHETER INSERTION N/A 06/06/2017   Procedure: DIALYSIS/PERMA CATHETER INSERTION;  Surgeon: Algernon Huxley, MD;  Location: [place] [facility];  Service: Cardiovascular;  Laterality: N/A;  . DIALYSIS/PERMA CATHETER REMOVAL N/A 05/04/2018   Procedure: DIALYSIS/PERMA CATHETER REMOVAL;  Surgeon: Algernon Huxley, MD;  Location: [place] [facility];  Service: Cardiovascular;  Laterality: N/A;  .  IR ANGIO INTRA EXTRACRAN SEL INTERNAL CAROTID UNI R MOD SED  12/17/2016  . RADIOLOGY WITH ANESTHESIA N/A 12/17/2016   Procedure: Arteriogram/Venogram with right venous sinus manometry;  Surgeon: Consuella Lose, MD;  Location: [place];  Service: Radiology;  Laterality: N/A;  . REVISON OF ARTERIOVENOUS FISTULA Left 01/04/2018   Procedure: REVISON OF ARTERIOVENOUS FISTULA ( BRACHIALCEPHALIC );  Surgeon: Katha Cabal, MD;  Location:  [facility] OR[facility];  Service: Vascular;  Laterality: Left;  . UPPER EXTREMITY ANGIOGRAPHY Left 10/19/2017   Procedure: UPPER EXTREMITY ANGIOGRAPHY;  Surgeon: Katha Cabal, MD;  Location: [place] [facility];  Service: Cardiovascular;  Laterality: Left;    Prior to Admission medications   Medication Sig Start Date End Date Taking? Authorizing Provider  allopurinol (ZYLOPRIM) 100 MG tablet Take 100 mg by mouth daily.    [provider]  amLODipine (NORVASC) 10 MG tablet Take 5 mg by mouth daily.     [provider]  amLODipine (NORVASC) 5 MG tablet Take 5 mg by mouth daily. 03/11/19   [provider]  anastrozole (ARIMIDEX) 1 MG tablet Take 1 mg by mouth daily.    [provider]  aspirin EC 81 MG tablet Take 81 mg by mouth daily.    [provider]  atorvastatin (LIPITOR) 20 MG tablet Take 20 mg by mouth daily.     [provider]  azelastine (ASTELIN) 0.1 % nasal spray USE 2 SPRAY(S) IN EACH NOSTRIL TWICE DAILY 04/20/18   [provider]  calcium acetate (PHOSLO) 667 MG capsule TAKE 2 CAPSULES BY MOUTH WITH MEALS AND 1 WITH SNACKS 01/26/18   [provider]  carvedilol (COREG) 3.125 MG tablet Take 3.125 mg by mouth daily. 02/16/18   [provider]  Cholecalciferol 1000 units tablet Take 1,000 Units by mouth daily.     [provider]  clomiPHENE (CLOMID) 50 MG tablet SMARTSIG:.5 Tablet(s) By Mouth Daily 02/11/19   [provider]  cloNIDine (CATAPRES) 0.1 MG tablet Take 1 tablet (0.1 mg total) by mouth 3 (three) times daily. 07/11/16   [name], MD  clopidogrel (PLAVIX) 75 MG tablet Take 1 tablet (75 mg total) by mouth daily. 08/24/18   Kris Hartmann, NP  cyclobenzaprine (FLEXERIL) 10 MG tablet Take 10 mg by mouth 3 (three) times daily as needed. 02/15/19   [provider]  doxazosin (CARDURA) 8 MG tablet Take 8 mg by mouth at bedtime.    [provider]  famotidine (PEPCID) 20  MG tablet Take 20 mg by mouth 2 (two) times daily. 10/14/18   [provider]  fluticasone (FLONASE) 50 MCG/ACT nasal spray Place 1 spray into both nostrils daily as needed for allergies or rhinitis.    [provider]  furosemide (LASIX) 40 MG tablet Take 40 mg by mouth. Takes on Tues, Thur, Sat, Sun    [provider]  gabapentin (NEURONTIN) 100 MG capsule Take 1 capsule (100 mg total) by mouth 3 (three) times daily. 07/11/16   [name], MD  glipiZIDE (GLUCOTROL XL) 2.5 MG 24 hr tablet Take 2.5 mg by mouth daily. 12/08/17   [provider]  hydrALAZINE (APRESOLINE) 100 MG tablet Take 100 mg by mouth 3 (three) times daily.  08/16/16   [provider]  HYDROcodone-acetaminophen (NORCO/VICODIN) 5-325 MG tablet Take 1 tablet by mouth every 6 (six) hours as needed for moderate pain. 02/05/19   Letitia Neri L, PA-C  lidocaine (LIDODERM) 5 % Place 1 patch onto the skin  every 12 (twelve) hours. Remove & Discard patch within 12 hours or as directed by MD 02/05/19 02/05/20  Johnn Hai, PA-C  methocarbamol (ROBAXIN) 500 MG tablet SMARTSIG:1-2.5 Tablet(s) By Mouth Twice Daily PRN 02/27/19   [provider]  montelukast (SINGULAIR) 10 MG tablet Take 10 mg by mouth daily. 12/14/18   [provider]  oxyCODONE (OXY IR/ROXICODONE) 5 MG immediate release tablet Take 5 mg by mouth every 6 (six) hours as needed. 10/06/18   [provider]  oxyCODONE-acetaminophen (PERCOCET) 5-325 MG tablet Take 2 tablets by mouth every 6 (six) hours as needed for severe pain. 03/16/19 03/15/20  Lamisha Roussell, Linden Dolin, PA-C  predniSONE (STERAPRED UNI-PAK 48 TAB) 10 MG (48) TBPK tablet Take 6 pills for 2 days, 5 pills x 2d, 4 pills x 2d, 3 pills x 2d, 2 pills x 2d, 1 pill x 2d 03/16/19   Caryn Section Linden Dolin, PA-C  sodium bicarbonate 325 MG tablet Take by mouth. 11/30/18   [provider]  tadalafil (CIALIS) 5 MG tablet Take 5 mg by mouth daily. 01/10/19    [provider]  tamsulosin (FLOMAX) 0.4 MG CAPS capsule Take 0.8 mg by mouth daily after breakfast.     [provider]  Testosterone (ANDROGEL) 20.25 MG/1.25GM (1.62%) GEL Place 4 Pump onto the skin daily.    [provider]  topiramate (TOPAMAX) 25 MG tablet Take 50 mg by mouth at bedtime.    [provider]  traZODone (DESYREL) 100 MG tablet Take 100 mg by mouth at bedtime.    [provider]  vitamin E 400 UNIT capsule Take 400 Units by mouth daily.    [provider]  vitamin E 400 UNIT capsule Take by mouth.    [provider]    Allergies Accupril [quinapril hcl] and Ace inhibitors  Family History  Problem Relation Age of Onset  . AAA (abdominal aortic aneurysm) Mother   . Diabetes Mother   . Heart disease Father     Social History Social History   Tobacco Use  . Smoking status: Current Every Day Smoker    Types: Cigarettes  . Smokeless tobacco: Never Used  . Tobacco comment: APPROX. 1 PK/WEEK   Substance Use Topics  . Alcohol use: Yes    Comment: weekends  . Drug use: Yes    Types: Marijuana    Comment: LAST TIME APPROX. 08/03/17    Review of Systems  Constitutional: No fever/chills Eyes: No visual changes. ENT: No sore throat. Respiratory: Denies cough Genitourinary: Negative for dysuria. Musculoskeletal: Positive for hip pain for back pain. Skin: Negative for rash.    ____________________________________________   PHYSICAL EXAM:  VITAL SIGNS: ED Triage Vitals  Enc Vitals Group     BP 03/16/19 0951 126/78     Pulse Rate 03/16/19 0951 (!) 104     Resp 03/16/19 0951 20     Temp 03/16/19 0951 99.1 F (37.3 C)     Temp Source 03/16/19 0951 Oral     SpO2 03/16/19 0951 99 %     Weight 03/16/19 0952 (!) 321 lb 14 oz (146 kg)     Height 03/16/19 0952 5' 11"  (1.803 m)     Head Circumference --      Peak Flow --      Pain Score 03/16/19 0952 10     Pain Loc --      Pain Edu? --       Excl. in [place]? --  Constitutional: Alert and oriented. Well appearing and in no acute distress. Eyes: Conjunctivae are normal.  Head: Atraumatic. Nose: No congestion/rhinnorhea. Mouth/Throat: Mucous membranes are moist.   Neck:  supple no lymphadenopathy noted Cardiovascular: Normal rate, regular rhythm. Respiratory: Normal respiratory effort.  No retractions,  Abd: soft nontender bs normal all 4 quad GU: deferred Musculoskeletal: Patient is laying flat on stretcher.  Difficulty sitting up.  Questionable whether is due to the obesity versus musculoskeletal problems.  Tender at the lumbar spine.  Hips are tender bilaterally.  Neurovascular is intact.   Neurologic:  Normal speech and language.  Skin:  Skin is warm, dry and intact. No rash noted. Psychiatric: Mood and affect are normal. Speech and behavior are normal.  ____________________________________________   LABS (all labs ordered are listed, but only abnormal results are displayed)  Labs Reviewed - No data to display ____________________________________________   ____________________________________________  RADIOLOGY  X-ray of the pelvis is negative  ____________________________________________   PROCEDURES  Procedure(s) performed: Saline lock, morphine 4 mg IV, Zofran 4 mg IV, Decadron 10 mg IV , Norflex 60 mg IV, Percocet.  To p.o.  Procedures    ____________________________________________   INITIAL IMPRESSION / ASSESSMENT AND PLAN / ED COURSE  Pertinent labs & imaging results that were available during my care of the patient were reviewed by me and considered in my medical decision making (see chart for details).   Patient is 48 year old male presents emergency department with complaints of low back pain.  States his pain is a 12 out of 10.  See HPI  Physical exam shows patient to appear well.  Lumbar spine is mildly tender.  Hips are tender.  Neurovascular is intact.  X-ray of the pelvis I reviewed  the MRI from 03/07/2019.  No concerns for cauda equina at this time.  Patient was given morphine 4 mg IV, Decadron 10 mg IV, Zofran 4 mg IV  ----------------------------------------- 11:49 AM on 03/16/2019 -----------------------------------------  Discussed x-rays with patient.  Explained to him the x-ray of the pelvis does not show a fracture or any acute concerns.  He states he did not get pain relief from his medications.  States he still cannot walk.  He is upset as he is spent money all the stage appointments and he is still not better.  Explained to him that to the emergency department treats acute pain not chronic pain.  Criteria for admission are not met at this time.  I instructed him that we would give him an additional amount of pain medication.  We are going to try a walker to see if he is able to bear weight with a walker.  ----------------------------------------- 12:54 PM on 03/16/2019 -----------------------------------------  Patient states he did feel better after the 2 Percocet.  He is instructed to follow-up with his regular doctor.  Use the walker to help himself rise from sitting position.  Follow-up with his regular doctor.  He was given a prescription for Sterapred 12-day Dosepak, Percocet 2 pills every 6 hours.  He was discharged in stable condition.   TONY GRANQUIST was evaluated in Emergency Department on 03/16/2019 for the symptoms described in the history of present illness. He was evaluated in the context of the global COVID-19 pandemic, which necessitated consideration that the patient might be at risk for infection with the SARS-CoV-2 virus that causes COVID-19. Institutional protocols and algorithms that pertain to the evaluation of patients at risk for COVID-19 are in a state of rapid change based on information released  by regulatory bodies including the CDC and federal and state organizations. These policies and algorithms were followed during the patient's care  in the ED.   As part of my medical decision making, I reviewed the following data within the [place] notes reviewed and incorporated, Old chart reviewed, Radiograph reviewed , Notes from prior ED visits and [place] Controlled Substance Database  ____________________________________________   FINAL CLINICAL IMPRESSION(S) / ED DIAGNOSES  Final diagnoses:  Chronic pain of both hips      NEW MEDICATIONS STARTED DURING THIS VISIT:  New Prescriptions   OXYCODONE-ACETAMINOPHEN (PERCOCET) 5-325 MG TABLET    Take 2 tablets by mouth every 6 (six) hours as needed for severe pain.   PREDNISONE (STERAPRED UNI-PAK 48 TAB) 10 MG (48) TBPK TABLET    Take 6 pills for 2 days, 5 pills x 2d, 4 pills x 2d, 3 pills x 2d, 2 pills x 2d, 1 pill x 2d     Note:  This document was prepared using Dragon voice recognition software and may include unintentional dictation errors.    Versie Starks, PA-C 03/16/19 1254    Blake Divine, MD 03/16/19 1556

## 2019-03-16 NOTE — ED Notes (Signed)
Water provided with water per pt's request

## 2019-03-16 NOTE — ED Notes (Signed)
Pt requesting to speak with MD. MD Rushie Chestnut made ware.

## 2019-03-16 NOTE — ED Notes (Signed)
ED Provider, Manuela Schwartz at bedside.

## 2019-03-16 NOTE — ED Notes (Signed)
ED Provider Kinner at bedside at this time per pt's request.

## 2019-03-16 NOTE — ED Triage Notes (Signed)
Patient to ER for c/o bilateral hip pain. States he has been dealing with hips hurting for months. Patient states he was told he had arthritis, Oxycodone is not working for him. Reports hips "lock up".

## 2019-03-16 NOTE — ED Notes (Signed)
Additional meds given   SW in with pt

## 2019-03-16 NOTE — ED Notes (Signed)
See triage note   Presents with bilateral hip pain states pain increases with ambulation  Pain is in lower back/hip area

## 2019-03-16 NOTE — Discharge Instructions (Addendum)
Follow-up with your regular doctor.  Return emergency department if worsening.  Take medications as prescribed.

## 2019-03-20 DIAGNOSIS — E349 Endocrine disorder, unspecified: Secondary | ICD-10-CM | POA: Diagnosis not present

## 2019-03-22 ENCOUNTER — Telehealth: Payer: Self-pay

## 2019-03-22 ENCOUNTER — Encounter: Payer: Self-pay | Admitting: Pain Medicine

## 2019-03-22 NOTE — Telephone Encounter (Signed)
The patient called back to over info for Monday appt

## 2019-03-22 NOTE — Telephone Encounter (Signed)
Unable to enter new patient form or clinical intake at this time.  New patient note not opened and unable to enter clinical intake because it is not available in the visit navigator.

## 2019-03-22 NOTE — Telephone Encounter (Signed)
Donald Fernandez returned call and getting history

## 2019-03-25 NOTE — Progress Notes (Signed)
Patient's Name: Donald Fernandez  MRN: [id number]  Referring Provider: Harvest Dark, FNP  DOB: 1970/10/27  PCP: Denton Lank, MD  DOS: 03/26/2019  Note by: [name], MD  Service setting: Ambulatory outpatient  Specialty: Interventional Pain Management  Location: [facility] Pain Management Virtual Visit  Visit type: Initial Patient Evaluation  Patient type: New Patient   Pain Management Virtual Encounter Note - Virtual Visit via Telephone Telehealth (real-time audio visits between healthcare provider and patient).   Patient's Phone No.:  [phone] (home); [phone] (mobile); (Preferred) [phone] [email or web address]  [facility] [place] [facility] [place]) [place] [address] Phone: [phone] Fax: [phone]    Pre-screening note:  Our staff contacted Donald Fernandez and offered him an "in person", "face-to-face" appointment versus a telephone encounter. He indicated preferring the telephone encounter, at this time.  Primary Reason(s) for Visit: Tele-Encounter for initial evaluation of one or more chronic problems (new to examiner) potentially causing chronic pain, and posing a threat to normal musculoskeletal function. (Level of risk: High) CC: Back Pain (low)  I contacted [name] on 03/26/2019 via telephone.      I clearly identified myself as [name], MD. I verified that I was speaking with the correct person using two identifiers (Name: GIANG Fernandez, and date of birth: [birth year]).  Advanced Informed Consent I sought verbal advanced consent from [name] for virtual visit interactions. I informed [name] of possible security and privacy concerns, risks, and limitations associated with providing "not-in-person" medical evaluation and management services. I also informed [name] of the availability of "in-person" appointments. Finally, I informed him that there would be a  charge for the virtual visit and that he could be  personally, fully or partially, financially responsible for it. Mr. Bartolucci expressed understanding and agreed to proceed.   HPI  Donald Fernandez is a 48 y.o. year old, male patient, contacted today for an initial evaluation of his chronic pain. He has Acute on chronic renal failure ([place]); Essential hypertension; Diabetes mellitus type 2 in obese ([place]); Pseudotumor cerebri; Angioedema; ESRD on dialysis ([place]); Complication of vascular access for dialysis; Occlusion and stenosis of basilar artery; Anemia; ESRD on hemodialysis ([place]); Gastroesophageal reflux disease without esophagitis; OSA (obstructive sleep apnea); Papilledema; Personal history of gout; Severe uncontrolled hypertension; Small vessel disease, cerebrovascular; Lumbar facet hypertrophy (Multilevel) (Bilateral); Lumbar foraminal stenosis (Multilevel) (Bilateral); DDD (degenerative disc disease), lumbosacral; Sacroiliitis (Bilateral); Osteoarthritis of lumbar spine; Osteoarthritis of facet joint of lumbar spine; Chronic pain syndrome; Pharmacologic therapy; Disorder of skeletal system; Problems influencing health status; Chronic gout 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 tophus; Chronic kidney disease (CKD) Stage 5, on chronic dialysis ([place]); Chronic low back pain (Primary Area of Pain) (Bilateral) (L>R) w/o sciatica; Lumbar facet syndrome (Bilateral) (L>R); Chronic lower extremity pain (Bilateral) (L>R); Spondylosis without myelopathy or radiculopathy, lumbosacral region; and Chronic anticoagulation (Plavix) on their problem list.   Onset and Duration: Sudden and Date of onset: 3 months ago Cause of pain: Unknown but felt a pop Severity: Getting better, NAS-11 at its worse: 10/10, NAS-11 at its best: 5/10, NAS-11 now: 5/10 and NAS-11 on the average: 5/10 Timing: Not influenced by the time of the day Aggravating Factors: nothing Alleviating Factors: Medications percocet 5/325 and prednisone Associated  Problems: Spasms, Pain that wakes patient up and Pain that does not allow patient to sleep Quality of Pain: Intermittent and Throbbing Previous Examinations or Tests: MRI scan and X-rays  Previous Treatments: Narcotic medications  Today I was able to get a hold of the patient while he was waiting to get on a bus to go have his dialysis.  According to him, his primary area of pain is that of the lower back, bilaterally, with the left being worse than the right.  He does seem to have a component of the pain that is referred towards the lower extremities, bilaterally, again with the left being worse than the right.  He describes the lower extremity pain as being equal on both sides when he comes to the distribution.  The pain goes down through the back of the leg to the level of the knee and although both of them feel weak, he denies any numbness or not being able to use his lower extremities.  He has problems with his hips "locking on him".  He also has problems with bilateral knee pain and he has been told that he needs to have bilateral total knee replacements, but these have been postponed due to the COVID-19 pandemic.  Of significance is the fact that he is on end-stage renal disease undergoing dialysis.  He is also on Plavix anticoagulation.  He takes Neurontin 100 mg p.o. 3 times daily for the sympathetic component to his pain.  He also describes taking oxycodone/APAP 5/325 2 tablets every 6 hours as needed for pain.  He also indicates having tried a prednisone pack for the pain, but this did not work.  Historic Controlled Substance Pharmacotherapy Review  PMP and historical list of controlled substances: Oxycodone/APAP; hydrocodone/APAP; testosterone Highest opioid analgesic regimen found: Oxycodone/APAP 5/325 1 tab PO 4 hours Most recent opioid analgesic: Oxycodone/APAP 5/325 1 tab PO 4 hours  Current opioid analgesics: Oxycodone/APAP 5/325 1 tab PO 4 hours (filled 03/19/2019) (Written by Jarvis Morgan, PA-C) Highest recorded MME/day: 50 mg/day MME/day: 50 mg/day  Medications: Bottles not available for inspection. Pharmacodynamics: Desired effects: Analgesia: The patient reports >50% benefit. Reported improvement in function: The patient reports medication allows him to accomplish basic ADLs. Clinically meaningful improvement in function (CMIF): Sustained CMIF goals met Perceived effectiveness: Described as relatively effective, allowing for increase in activities of daily living (ADL) Undesirable effects: Side-effects or Adverse reactions: None reported Historical Monitoring: The patient  reports current drug use. Drug: Marijuana. List of all UDS Test(s): Lab Results  Component Value Date   MDMA NONE DETECTED 01/04/2018   MDMA NONE DETECTED 09/02/2017   MDMA NONE DETECTED 08/17/2017   COCAINSCRNUR NONE DETECTED 01/04/2018   COCAINSCRNUR NONE DETECTED 09/02/2017   COCAINSCRNUR POSITIVE (A) 08/17/2017   PCPSCRNUR NONE DETECTED 01/04/2018   PCPSCRNUR NONE DETECTED 09/02/2017   PCPSCRNUR NONE DETECTED 08/17/2017   THCU NONE DETECTED 01/04/2018   THCU NONE DETECTED 09/02/2017   THCU NONE DETECTED 08/17/2017   List of other Serum/Urine Drug Screening Test(s):  Lab Results  Component Value Date   COCAINSCRNUR NONE DETECTED 01/04/2018   COCAINSCRNUR NONE DETECTED 09/02/2017   COCAINSCRNUR POSITIVE (A) 08/17/2017   THCU NONE DETECTED 01/04/2018   THCU NONE DETECTED 09/02/2017   THCU NONE DETECTED 08/17/2017   Historical Background Evaluation: [place] PMP: PDMP reviewed during this encounter. Six (6) year initial data search conducted.             PMP NARX Score Report:  Narcotic: 310 Sedative: 150 Stimulant: 000 [place] Department of public safety, offender search: Editor, commissioning Information) Non-contributory Risk Assessment Profile: Aberrant behavior: None observed or detected today Risk factors for fatal opioid overdose: None identified  today PMP NARX Overdose Risk Score:  370 Fatal overdose hazard ratio (HR): Calculation deferred Non-fatal overdose hazard ratio (HR): Calculation deferred Risk of opioid abuse or dependence: 0.7-3.0% with doses ? 36 MME/day and 6.1-26% with doses ? 120 MME/day. Substance use disorder (SUD) risk level: See below Personal History of Substance Abuse (SUD-Substance use disorder):  Alcohol:    Illegal Drugs:    Rx Drugs:    ORT Risk Level calculation:    ORT Scoring interpretation table:  Score <3 = Low Risk for SUD  Score between 4-7 = Moderate Risk for SUD  Score >8 = High Risk for Opioid Abuse   Pharmacologic Plan: As per protocol, I have not taken over any controlled substance management, pending the results of ordered tests and/or consults.            Initial impression: Pending review of available data and ordered tests.  Meds   Current Outpatient Medications:  .  amLODipine (NORVASC) 10 MG tablet, Take 5 mg by mouth daily. , Disp: , Rfl:  .  anastrozole (ARIMIDEX) 1 MG tablet, Take 1 mg by mouth daily., Disp: , Rfl:  .  aspirin EC 81 MG tablet, Take 81 mg by mouth daily., Disp: , Rfl:  .  atorvastatin (LIPITOR) 20 MG tablet, Take 20 mg by mouth daily. , Disp: , Rfl:  .  azelastine (ASTELIN) 0.1 % nasal spray, USE 2 SPRAY(S) IN EACH NOSTRIL TWICE DAILY, Disp: , Rfl:  .  calcium acetate (PHOSLO) 667 MG capsule, TAKE 2 CAPSULES BY MOUTH WITH MEALS AND 1 WITH SNACKS, Disp: , Rfl: 0 .  carvedilol (COREG) 3.125 MG tablet, Take 3.125 mg by mouth daily., Disp: , Rfl: 5 .  Cholecalciferol 1000 units tablet, Take 1,000 Units by mouth daily. , Disp: , Rfl:  .  clomiPHENE (CLOMID) 50 MG tablet, SMARTSIG:.5 Tablet(s) By Mouth Daily, Disp: , Rfl:  .  cloNIDine (CATAPRES) 0.1 MG tablet, Take 1 tablet (0.1 mg total) by mouth 3 (three) times daily. (Patient taking differently: Take 0.1 mg by mouth daily. ), Disp: 90 tablet, Rfl: 0 .  clopidogrel (PLAVIX) 75 MG tablet, Take 1 tablet (75 mg total) by mouth daily., Disp: 30 tablet, Rfl:  11 .  cyclobenzaprine (FLEXERIL) 5 MG tablet, Take 5 mg by mouth 3 (three) times daily., Disp: , Rfl:  .  famotidine (PEPCID) 20 MG tablet, Take 20 mg by mouth 2 (two) times daily., Disp: , Rfl:  .  fluticasone (FLONASE) 50 MCG/ACT nasal spray, Place 1 spray into both nostrils daily as needed for allergies or rhinitis., Disp: , Rfl:  .  furosemide (LASIX) 40 MG tablet, Take 40 mg by mouth. Takes on Tues, Thur, Sat, Sun, Disp: , Rfl:  .  gabapentin (NEURONTIN) 100 MG capsule, Take 1 capsule (100 mg total) by mouth 3 (three) times daily., Disp: 90 capsule, Rfl: 2 .  montelukast (SINGULAIR) 10 MG tablet, Take 10 mg by mouth daily., Disp: , Rfl:  .  oxyCODONE-acetaminophen (PERCOCET) 5-325 MG tablet, Take 2 tablets by mouth every 6 (six) hours as needed for severe pain. (Patient taking differently: Take 2 tablets by mouth every 12 (twelve) hours. ), Disp: 20 tablet, Rfl: 0 .  predniSONE (STERAPRED UNI-PAK 48 TAB) 10 MG (48) TBPK tablet, Take 6 pills for 2 days, 5 pills x 2d, 4 pills x 2d, 3 pills x 2d, 2 pills x 2d, 1 pill x 2d, Disp: 48 tablet, Rfl: 0 .  sodium bicarbonate 325 MG tablet, Take by  mouth., Disp: , Rfl:  .  tadalafil (CIALIS) 5 MG tablet, Take 20 mg by mouth daily. , Disp: , Rfl:  .  tamsulosin (FLOMAX) 0.4 MG CAPS capsule, Take 0.8 mg by mouth daily after breakfast. , Disp: , Rfl:  .  topiramate (TOPAMAX) 25 MG tablet, Take 50 mg by mouth at bedtime., Disp: , Rfl:  .  vitamin E 400 UNIT capsule, Take by mouth., Disp: , Rfl:  .  Testosterone (ANDROGEL) 20.25 MG/1.25GM (1.62%) GEL, Place 4 Pump onto the skin daily., Disp: , Rfl:   ROS  Cardiovascular: Blood thinners:  Anticoagulant Pulmonary or Respiratory: No reported pulmonary signs or symptoms such as wheezing and difficulty taking a deep full breath (Asthma), difficulty blowing air out (Emphysema), coughing up mucus (Bronchitis), persistent dry cough, or temporary stoppage of breathing during sleep Neurological: No reported  neurological signs or symptoms such as seizures, abnormal skin sensations, urinary and/or fecal incontinence, being born with an abnormal open spine and/or a tethered spinal cord Review of Past Neurological Studies:  Results for orders placed or performed during the hospital encounter of 08/17/18  MR MRA HEAD WO CONTRAST   Narrative   CLINICAL DATA:  Left pulsatile tinnitus.  EXAM: MRA HEAD WITHOUT CONTRAST  TECHNIQUE: Angiographic images of the Circle of Willis were obtained using MRA technique without intravenous contrast.  COMPARISON:  None.  FINDINGS: The visualized distal vertebral arteries are patent to the basilar with the left being moderately dominant. PICA origins were not imaged. AICAs and SCAs are grossly patent proximally bilaterally. The basilar artery is patent with an approximately stenosis in its midportion. PCAs are patent with asymmetric attenuation of distal left PCA branch vessels. Mild P2 stenoses are present bilaterally.  The internal carotid arteries are widely patent from skull base to carotid termini. ACAs and MCAs are patent with branch vessel irregularity but no evidence of proximal branch occlusion or significant proximal stenosis. No aneurysm or vascular malformation is identified.  IMPRESSION: 1. Intracranial atherosclerosis including a 60% basilar artery stenosis. 2. No major vessel occlusion, aneurysm, or vascular malformation.   Electronically Signed   By: Logan Bores M.D.   On: 08/17/2018 13:37   Results for orders placed or performed during the hospital encounter of 06/21/17  CT Head Wo Contrast   Narrative   CLINICAL DATA:  Initial evaluation for acute trauma, right-sided facial swelling.  EXAM: CT HEAD WITHOUT CONTRAST  CT MAXILLOFACIAL WITHOUT CONTRAST  TECHNIQUE: Multidetector CT imaging of the head and maxillofacial structures were performed using the standard protocol without intravenous contrast. Multiplanar CT image  reconstructions of the maxillofacial structures were also generated.  COMPARISON:  Prior MRI from 08/20/2016.  FINDINGS: CT HEAD FINDINGS  Brain: Generalized age-related cerebral atrophy with mild chronic small vessel ischemic disease. Scatter remote lacunar infarct present within the bilateral basal ganglia and left thalamus. No acute intracranial hemorrhage. No acute large vessel territory infarct. No mass lesion, midline shift or mass effect. No hydrocephalus. No extra-axial fluid collection.  Vascular: No hyperdense vessel. Calcified atherosclerosis present at the skull base.  Skull: Soft tissue swelling present at the right periorbital/facial region. No other scalp soft tissue abnormality. Calvarium intact.  Other: Mastoid air cells are clear.  CT MAXILLOFACIAL FINDINGS  Osseous: Zygomatic arches intact. Pterygoid plates intact. Acute minimally depressed right nasal bone fracture (series 7, image 36). Left nasal bone intact. Nasal septum mildly bowed to the right but intact. No acute mandibular fracture. Mandibular condyles normally situated. There is an acute fracture extending  through the right maxillary central incisor (series 7, image 59). Associated mild displacement with dental loosening. No other acute maxillary fracture. No other acute abnormality about the dentition. Scattered dental caries with periapical lucencies noted.  Orbits: Globes intact. No retro-orbital hematoma or other pathology. Bony orbits intact.  Sinuses: Mild scattered mucoperiosteal thickening within the ethmoidal air cells and maxillary sinuses. Otherwise clear. No hemosinus.  Soft tissues: Soft tissue contusion present within the right periorbital region and right face. No other acute soft tissue injury identified. Right IJ approach central venous catheter noted. Subcentimeter sialolith noted within the superior right parotid gland.  IMPRESSION: CT HEAD:  1. No acute intracranial  process identified. 2. Generalized age-related cerebral atrophy with mild chronic small vessel ischemic disease.  CT MAXILLOFACIAL:  1. Acute mildly displaced fracture involving the right maxillary alveolar ridge with mild displacement of the right maxillary central incisor. 2. Acute minimally depressed right nasal bone fracture. 3. Right periorbital and facial soft tissue contusion. 4. Poor dentition.   Electronically Signed   By: Jeannine Boga M.D.   On: 06/21/2017 21:14   Results for orders placed or performed during the hospital encounter of 03/09/16  MR BRAIN WO CONTRAST   Narrative   CLINICAL DATA:  Papilledema. Visual disturbance for the last 9 months. GFR 17.  EXAM: MRI HEAD WITHOUT CONTRAST  TECHNIQUE: Multiplanar, multiecho pulse sequences of the brain and surrounding structures were obtained without intravenous contrast.  COMPARISON:  Head CT 12/30/2011  FINDINGS: Brain: Diffusion imaging does not show any acute or subacute infarction or other cause of restricted diffusion. The brainstem and cerebellum are normal. Cerebral hemispheres show an old lacunar infarction in the left lateral thalamus and chronic appearing small vessel ischemic changes of the deep and subcortical white matter bilaterally. No cortical or large vessel territory infarction. No mass lesion, hemorrhage, hydrocephalus or extra-axial collection. Arachnoid herniation into the sella, which can be seen with elevated intracranial pressure or can be an normal variant.  Vascular: Major vessels at the base of the brain show flow.  Skull and upper cervical spine: Negative  Sinuses/Orbits: Sinuses are clear. Globes appear normal. Optic nerves and other orbital contents appear normal on this routine head exam.  Other: None  IMPRESSION: Chronic small vessel ischemic changes as outlined above. No acute or reversible intracranial finding. No finding specific for intracranial  hypertension. Arachnoid herniation into the sella which can be an normal variant or can, in certain cases, be associated with increased intracranial pressure   Electronically Signed   By: Nelson Chimes M.D.   On: 03/09/2016 10:42    Psychological-Psychiatric: No reported psychological or psychiatric signs or symptoms such as difficulty sleeping, anxiety, depression, delusions or hallucinations (schizophrenial), mood swings (bipolar disorders) or suicidal ideations or attempts Gastrointestinal: Irregular, infrequent bowel movements (Constipation) Genitourinary: Dialysis Mon,Wed,Fri Hematological: No reported hematological signs or symptoms such as prolonged bleeding, low or poor functioning platelets, bruising or bleeding easily, hereditary bleeding problems, low energy levels due to low hemoglobin or being anemic Endocrine: High blood sugar controlled without the use of insulin (NIDDM) and Slow thyroid Rheumatologic: Joint aches and or swelling due to excess weight (Osteoarthritis) Musculoskeletal: Negative for myasthenia gravis, muscular dystrophy, multiple sclerosis or malignant hyperthermia Work History: left blank  Allergies  Mr. Bunt is allergic to accupril [quinapril hcl] and ace inhibitors.  Laboratory Chemistry Profile   Screening Lab Results  Component Value Date   STAPHAUREUS NEGATIVE 12/17/2016   MRSAPCR POSITIVE (A) 06/06/2017   HCVAB <0.1 06/08/2017  HIV Non Reactive 07/09/2016    Inflammation (CRP: Acute Phase) (ESR: Chronic Phase) Lab Results  Component Value Date   LATICACIDVEN 0.7 07/09/2016                         Rheumatology No results found.  Renal Lab Results  Component Value Date   BUN 52 (H) 12/14/2017   CREATININE 12.01 (H) 12/14/2017   GFRAA 5 (L) 12/14/2017   GFRNONAA 4 (L) 12/14/2017                             Hepatic Lab Results  Component Value Date   AST 35 06/21/2017   ALT 18 06/21/2017   ALBUMIN 3.5 06/21/2017   ALKPHOS 80  06/21/2017   HCVAB <0.1 06/08/2017                        Electrolytes Lab Results  Component Value Date   NA 140 01/04/2018   K 3.5 01/04/2018   CL 100 12/14/2017   CALCIUM 8.4 (L) 12/14/2017   PHOS 5.3 (H) 06/07/2017                        Neuropathy Lab Results  Component Value Date   HGBA1C 5.1 06/05/2017   HIV Non Reactive 07/09/2016                        CNS Lab Results  Component Value Date   COLORCSF COLORLESS 08/30/2016   APPEARCSF CLEAR (A) 08/30/2016   RBCCOUNTCSF 6 (H) 08/30/2016   WBCCSF 0 08/30/2016   POLYSCSF 0 08/30/2016   LYMPHSCSF 0 08/30/2016   EOSCSF 0 08/30/2016   PROTEINCSF 27 08/30/2016   GLUCCSF 70 08/30/2016                        Bone No results found.  Coagulation Lab Results  Component Value Date   INR 0.88 12/14/2017   LABPROT 11.9 12/14/2017   APTT 29 12/14/2017   PLT 247 12/14/2017                        Cardiovascular Lab Results  Component Value Date   CKTOTAL 415 (H) 12/31/2011   CKMB 2.9 12/31/2011   TROPONINI 0.06 (H) 12/31/2011   HGB 12.6 (L) 01/04/2018   HCT 37.0 (L) 01/04/2018                         ID Lab Results  Component Value Date   HIV Non Reactive 07/09/2016   STAPHAUREUS NEGATIVE 12/17/2016   MRSAPCR POSITIVE (A) 06/06/2017   HCVAB <0.1 06/08/2017    Cancer No results found.  Endocrine Lab Results  Component Value Date   TSH 2.476 07/09/2016                        Note: Lab results reviewed.  Imaging Review  Lumbosacral Imaging: Lumbar MR wo contrast:  Results for orders placed during the hospital encounter of 03/06/19  MR LUMBAR SPINE WO CONTRAST   Narrative CLINICAL DATA:  48 year old male with 2 years of low back pain radiating to the bilateral hips and legs. No known injury.  EXAM: MRI LUMBAR SPINE WITHOUT CONTRAST  TECHNIQUE: Multiplanar, multisequence MR imaging  of the lumbar spine was performed. No intravenous contrast was administered.  COMPARISON:  Lumbar radiographs  02/05/2019.  FINDINGS: Segmentation:  Normal on the comparison radiographs.  Alignment: Preserved lumbar lordosis. Subtle retrolisthesis of L5 on S1.  Vertebrae: Degenerative superior endplate Schmorl's nodes associated with mild marrow edema at the L3, L4 and L5 endplates (series 7 image 9). Similar mild inferior endplate Schmorl's node edema at T12.  Normal background bone marrow signal. Intact visible sacrum and SI joints. No other acute osseous abnormality identified.  Conus medullaris and cauda equina: Conus extends to the L1-L2 level. No lower spinal cord or conus signal abnormality.  Paraspinal and other soft tissues: A degree of bilateral renal atrophy is suspected (series 8, image 11). Ectatic abdominal aorta up to 25 millimeters diameter. Other visualized abdominal viscera and paraspinal soft tissues are within normal limits.  Disc levels:  T12-L1:  Mild disc space loss with minimal disc bulge. No stenosis.  L1-2:  Negative.  L2-3: Mild mostly far lateral disc bulging and endplate spurring. Borderline to mild posterior element hypertrophy. No stenosis.  L3-4: Mild far lateral disc bulge and endplate spurring. Mild posterior element hypertrophy. Borderline to mild bilateral L3 foraminal stenosis.  L4-5: Mild far lateral disc bulging and endplate spurring. Mild posterior element hypertrophy. Borderline to mild bilateral L4 foraminal stenosis.  L5-S1: Mild circumferential disc bulge and endplate spurring. Moderate facet and mild ligament flavum hypertrophy. Degenerative facet joint fluid greater on the right. Mild epidural lipomatosis. No spinal or lateral recess stenosis. Mild to moderate left and mild right L5 foraminal stenosis.  IMPRESSION: 1. Degenerative vertebral edema associated with superior endplate Schmorl's nodes at T12, L3, L4 and L5. No other acute osseous abnormality. 2. Mild for age lumbar spine degeneration with no spinal or lateral recess  stenosis. Up to mild multilevel neural foraminal stenosis related to disc bulging and posterior element hypertrophy. Moderate left L5 neural foraminal stenosis in part due to moderate facet arthropathy. 3. Ectatic abdominal aorta at risk for aneurysm development. Recommend followup by ultrasound in 5 years. This recommendation follows ACR consensus guidelines: White Paper of the ACR Incidental Findings Committee II on Vascular Findings. J Am Coll Radiol 2013; 10:789-794. Aortic aneurysm NOS (ICD10-I71.9)   Electronically Signed   By: Genevie Ann M.D.   On: 03/06/2019 10:29    Lumbar DG 2-3 views:  Results for orders placed during the hospital encounter of 02/05/19  DG Lumbar Spine 2-3 Views   Narrative CLINICAL DATA:  Low back pain, no injury, pain to buttocks bilaterally up to mid back, history end-stage renal disease on dialysis, diabetes mellitus, hypertension, GERD, gout, neuropathy  EXAM: LUMBAR SPINE - 2-3 VIEW  COMPARISON:  None  FINDINGS: 5 non-rib-bearing lumbar vertebra.  Vertebral body and disc space heights maintained.  Osseous mineralization grossly normal.  No fracture, subluxation, bone destruction or spondylolysis.  SI joints appear expanded, sclerotic and ill-defined consistent with sacroiliitis.  Aortic atherosclerosis.  IMPRESSION: No acute lumbar spine abnormalities.  BILATERAL sacroiliitis.   Electronically Signed   By: Lavonia Dana M.D.   On: 02/05/2019 18:32    Complexity Note: Imaging results reviewed. Results shared with Mr. Diesel, using Layman's terms.                         [place]  Drug: Mr. Peral  reports current drug use. Drug: Marijuana. Alcohol:  reports current alcohol use. Tobacco:  reports that he has been smoking cigarettes. He has never used  smokeless tobacco. Medical:  has a past medical history of Arthritis, Asthma, BPH (benign prostatic hyperplasia), Diabetes mellitus without complication ([place]), Dialysis patient ([place]),  GERD (gastroesophageal reflux disease), Gout, Hyperlipidemia, Hypertension, Neuropathy, Renal disorder, Restless legs, and Sleep apnea. Family: family history includes AAA (abdominal aortic aneurysm) in his mother; Diabetes in his mother; Heart disease in his father.  Past Surgical History:  Procedure Laterality Date  . A/V FISTULAGRAM N/A 06/08/2017   Procedure: A/V FISTULAGRAM;  Surgeon: Algernon Huxley, MD;  Location: [facility];  Service: Cardiovascular;  Laterality: N/A;  . ANKLE SURGERY Right    fracture repair with pins, plates, screws  . AV FISTULA INSERTION W/ RF MAGNETIC GUIDANCE Left 09/02/2017   Procedure: AV FISTULA INSERTION W/RF MAGNETIC GUIDANCE;  Surgeon: Katha Cabal, MD;  Location: [facility];  Service: Cardiovascular;  Laterality: Left;  . AV FISTULA PLACEMENT Left 08/04/2016   Procedure: ARTERIOVENOUS (AV) FISTULA CREATION ( RADIOCEPHALIC );  Surgeon: Katha Cabal, MD;  Location: [facility];  Service: Vascular;  Laterality: Left;  . DIALYSIS/PERMA CATHETER INSERTION N/A 06/06/2017   Procedure: DIALYSIS/PERMA CATHETER INSERTION;  Surgeon: Algernon Huxley, MD;  Location: [place] [facility];  Service: Cardiovascular;  Laterality: N/A;  . DIALYSIS/PERMA CATHETER REMOVAL N/A 05/04/2018   Procedure: DIALYSIS/PERMA CATHETER REMOVAL;  Surgeon: Algernon Huxley, MD;  Location: [facility];  Service: Cardiovascular;  Laterality: N/A;  . IR ANGIO INTRA EXTRACRAN SEL INTERNAL CAROTID UNI R MOD SED  12/17/2016  . RADIOLOGY WITH ANESTHESIA N/A 12/17/2016   Procedure: Arteriogram/Venogram with right venous sinus manometry;  Surgeon: Consuella Lose, MD;  Location: [facility];  Service: Radiology;  Laterality: N/A;  . REVISON OF ARTERIOVENOUS FISTULA Left 01/04/2018   Procedure: REVISON OF ARTERIOVENOUS FISTULA ( BRACHIALCEPHALIC );  Surgeon: Katha Cabal, MD;  Location: [facility];  Service: Vascular;  Laterality: Left;  . UPPER EXTREMITY ANGIOGRAPHY Left 10/19/2017    Procedure: UPPER EXTREMITY ANGIOGRAPHY;  Surgeon: Katha Cabal, MD;  Location: [facility];  Service: Cardiovascular;  Laterality: Left;   Active Ambulatory Problems    Diagnosis Date Noted  . Acute on chronic renal failure ([place]) 07/09/2016  . Essential hypertension 08/02/2016  . Diabetes mellitus type 2 in obese ([place]) 08/02/2016  . Pseudotumor cerebri 12/17/2016  . Angioedema 06/05/2017  . ESRD on dialysis ([place]) 07/27/2017  . Complication of vascular access for dialysis 10/31/2017  . Occlusion and stenosis of basilar artery 08/24/2018  . Anemia 03/15/2019  . ESRD on hemodialysis ([place]) 06/07/2018  . Gastroesophageal reflux disease without esophagitis 06/07/2018  . OSA (obstructive sleep apnea) 11/27/2014  . Papilledema 07/30/2016  . Personal history of gout 06/07/2018  . Severe uncontrolled hypertension 07/30/2016  . Small vessel disease, cerebrovascular 06/07/2018  . Lumbar facet hypertrophy (Multilevel) (Bilateral) 03/26/2019  . Lumbar foraminal stenosis (Multilevel) (Bilateral) 03/26/2019  . DDD (degenerative disc disease), lumbosacral 03/26/2019  . Sacroiliitis (Bilateral) 03/26/2019  . Osteoarthritis of lumbar spine 03/26/2019  . Osteoarthritis of facet joint of lumbar spine 03/26/2019  . Chronic pain syndrome 03/26/2019  . Pharmacologic therapy 03/26/2019  . Disorder of skeletal system 03/26/2019  . Problems influencing health status 03/26/2019  . Chronic gout 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 tophus 03/26/2019  . Chronic kidney disease (CKD) Stage 5, on chronic dialysis ([place]) 03/26/2019  . Chronic low back pain (Primary Area of Pain) (Bilateral) (L>R) w/o sciatica 03/26/2019  . Lumbar facet syndrome (Bilateral) (L>R) 03/26/2019  . Chronic lower extremity pain (Bilateral) (  L>R) 03/26/2019  . Spondylosis without myelopathy or radiculopathy, lumbosacral region 03/26/2019  . Chronic anticoagulation (Plavix) 03/26/2019   Resolved Ambulatory Problems     Diagnosis Date Noted  . No Resolved Ambulatory Problems   Past Medical History:  Diagnosis Date  . Arthritis   . Asthma   . BPH (benign prostatic hyperplasia)   . Diabetes mellitus without complication ([place])   . Dialysis patient ([place])   . GERD (gastroesophageal reflux disease)   . Gout   . Hyperlipidemia   . Hypertension   . Neuropathy   . Renal disorder   . Restless legs   . Sleep apnea    Assessment  Primary Diagnosis & Pertinent Problem List: The primary encounter diagnosis was Chronic pain syndrome. Diagnoses of Chronic low back pain (Bilateral) (L>R) w/o sciatica, Lumbar facet syndrome (Bilateral) (L>R), Lumbar facet hypertrophy (Multilevel) (Bilateral), Osteoarthritis of facet joint of lumbar spine, Spondylosis without myelopathy or radiculopathy, lumbosacral region, DDD (degenerative disc disease), lumbosacral, Lumbar foraminal stenosis (Multilevel) (Bilateral), Chronic lower extremity pain (Bilateral) (L>R), Sacroiliitis (Bilateral), Osteoarthritis of lumbar spine, Pharmacologic therapy, Disorder of skeletal system, Problems influencing health status, Chronic gout due to renal impairment, Chronic kidney disease (CKD) Stage 5, on chronic dialysis ([place]), Diabetes mellitus type 2 in obese ([place]), and Chronic anticoagulation (Plavix) were also pertinent to this visit.  Visit Diagnosis (New problems to examiner): 1. Chronic pain syndrome   2. Chronic low back pain (Bilateral) (L>R) w/o sciatica   3. Lumbar facet syndrome (Bilateral) (L>R)   4. Lumbar facet hypertrophy (Multilevel) (Bilateral)   5. Osteoarthritis of facet joint of lumbar spine   6. Spondylosis without myelopathy or radiculopathy, lumbosacral region   7. DDD (degenerative disc disease), lumbosacral   8. Lumbar foraminal stenosis (Multilevel) (Bilateral)   9. Chronic lower extremity pain (Bilateral) (L>R)   10. Sacroiliitis (Bilateral)   11. Osteoarthritis of lumbar spine   12. Pharmacologic therapy   13. Disorder  of skeletal system   14. Problems influencing health status   15. Chronic gout due to renal impairment   16. Chronic kidney disease (CKD) Stage 5, on chronic dialysis ([place])   17. Diabetes mellitus type 2 in obese ([place])   18. Chronic anticoagulation (Plavix)    Plan of Care (Initial workup plan)  Note: Mr. Jumper was reminded that as per protocol, today's visit has been an evaluation only. We have not taken over the patient's controlled substance management.  Problem-specific plan: No problem-specific Assessment & Plan notes found for this encounter.  Lab Orders  No laboratory test(s) ordered today   Imaging Orders  No imaging studies ordered today   Referral Orders  No referral(s) requested today   Procedure Orders    No procedure(s) ordered today   Pharmacotherapy (current): Medications ordered:  No orders of the defined types were placed in this encounter.  Medications administered during this visit: Tavius A. Streety had no medications administered during this visit.   Pharmacological management options:  Opioid Analgesics: The patient was informed that there is no guarantee that he would be a candidate for opioid analgesics. The decision will be made following CDC guidelines. This decision will be based on the results of diagnostic studies, as well as Mr. Nolley's risk profile.   Membrane stabilizer: To be determined at a later time  Muscle relaxant: To be determined at a later time  NSAID: To be determined at a later time  Other analgesic(s): To be determined at a later time   Interventional management options:  Mr. Pickard was informed that there is no guarantee that he would be a candidate for interventional therapies. The decision will be based on the results of diagnostic studies, as well as [name]'s risk profile.  Procedure(s) under consideration:  NOTE: PLAVIX ANTICOAGULATION (Stop: 7-10 days  Re-start: 2 hrs) Diagnostic bilateral lumbar facet block #1   Diagnostic bilateral intra-articular hip joint injections  Palliative bilateral knee joint injections    Provider-requested follow-up: Return for Procedure (w/ sedation): (B) L-FCT BLK #1, (Blood-thinner Protocol).  Future Appointments  Date Time Provider [place]  09/20/2019  2:00 PM [facility] VASC 3 [facility] None  09/20/2019  3:00 PM Schnier, Dolores Lory, MD [facility] None    Total duration of non-face-to-face encounter: 30 minutes.  Primary Care Physician: Denton Lank, MD Location: [facility] Note by: [name], MD Date: 03/26/2019; Time: 2:55 PM  Note: This dictation was prepared with Dragon dictation. Any transcriptional errors that may result from this process are unintentional.

## 2019-03-26 ENCOUNTER — Ambulatory Visit: Payer: PPO | Attending: Pain Medicine | Admitting: Pain Medicine

## 2019-03-26 ENCOUNTER — Other Ambulatory Visit: Payer: Self-pay

## 2019-03-26 DIAGNOSIS — M47817 Spondylosis without myelopathy or radiculopathy, lumbosacral region: Secondary | ICD-10-CM

## 2019-03-26 DIAGNOSIS — M47816 Spondylosis without myelopathy or radiculopathy, lumbar region: Secondary | ICD-10-CM

## 2019-03-26 DIAGNOSIS — M545 Low back pain, unspecified: Secondary | ICD-10-CM | POA: Insufficient documentation

## 2019-03-26 DIAGNOSIS — M48061 Spinal stenosis, lumbar region without neurogenic claudication: Secondary | ICD-10-CM

## 2019-03-26 DIAGNOSIS — M461 Sacroiliitis, not elsewhere classified: Secondary | ICD-10-CM | POA: Insufficient documentation

## 2019-03-26 DIAGNOSIS — Z789 Other specified health status: Secondary | ICD-10-CM | POA: Diagnosis not present

## 2019-03-26 DIAGNOSIS — M79605 Pain in left leg: Secondary | ICD-10-CM

## 2019-03-26 DIAGNOSIS — G894 Chronic pain syndrome: Secondary | ICD-10-CM

## 2019-03-26 DIAGNOSIS — M899 Disorder of bone, unspecified: Secondary | ICD-10-CM

## 2019-03-26 DIAGNOSIS — M1A30X0 Chronic gout due to renal impairment, unspecified site, without tophus (tophi): Secondary | ICD-10-CM | POA: Diagnosis not present

## 2019-03-26 DIAGNOSIS — Z79899 Other long term (current) drug therapy: Secondary | ICD-10-CM | POA: Insufficient documentation

## 2019-03-26 DIAGNOSIS — G8929 Other chronic pain: Secondary | ICD-10-CM

## 2019-03-26 DIAGNOSIS — M79604 Pain in right leg: Secondary | ICD-10-CM

## 2019-03-26 DIAGNOSIS — N186 End stage renal disease: Secondary | ICD-10-CM

## 2019-03-26 DIAGNOSIS — E669 Obesity, unspecified: Secondary | ICD-10-CM

## 2019-03-26 DIAGNOSIS — M5137 Other intervertebral disc degeneration, lumbosacral region: Secondary | ICD-10-CM

## 2019-03-26 DIAGNOSIS — Z7901 Long term (current) use of anticoagulants: Secondary | ICD-10-CM

## 2019-03-26 DIAGNOSIS — Z992 Dependence on renal dialysis: Secondary | ICD-10-CM

## 2019-03-26 DIAGNOSIS — E1169 Type 2 diabetes mellitus with other specified complication: Secondary | ICD-10-CM

## 2019-03-26 NOTE — Patient Instructions (Addendum)
____________________________________________________________________________________________  Preparing for Procedure with Sedation  Procedure appointments are limited to planned procedures: . No Prescription Refills. . No disability issues will be discussed. . No medication changes will be discussed.  Instructions: . Oral Intake: Do not eat or drink anything for at least 8 hours prior to your procedure. . Transportation: Public transportation is not allowed. Bring an adult driver. The driver must be physically present in our waiting room before any procedure can be started. . Physical Assistance: Bring an adult physically capable of assisting you, in the event you need help. This adult should keep you company at home for at least 6 hours after the procedure. . Blood Pressure Medicine: Take your blood pressure medicine with a sip of water the morning of the procedure. . Blood thinners: Notify our staff if you are taking any blood thinners. Depending on which one you take, there will be specific instructions on how and when to stop it. . Diabetics on insulin: Notify the staff so that you can be scheduled 1st case in the morning. If your diabetes requires high dose insulin, take only  of your normal insulin dose the morning of the procedure and notify the staff that you have done so. . Preventing infections: Shower with an antibacterial soap the morning of your procedure. . Build-up your immune system: Take 1000 mg of Vitamin C with every meal (3 times a day) the day prior to your procedure. . Antibiotics: Inform the staff if you have a condition or reason that requires you to take antibiotics before dental procedures. . Pregnancy: If you are pregnant, call and cancel the procedure. . Sickness: If you have a cold, fever, or any active infections, call and cancel the procedure. . Arrival: You must be in the facility at least 30 minutes prior to your scheduled procedure. . Children: Do not bring  children with you. . Dress appropriately: Bring dark clothing that you would not mind if they get stained. . Valuables: Do not bring any jewelry or valuables.  Reasons to call and reschedule or cancel your procedure: (Following these recommendations will minimize the risk of a serious complication.) . Surgeries: Avoid having procedures within 2 weeks of any surgery. (Avoid for 2 weeks before or after any surgery). . Flu Shots: Avoid having procedures within 2 weeks of a flu shots or . (Avoid for 2 weeks before or after immunizations). . Barium: Avoid having a procedure within 7-10 days after having had a radiological study involving the use of radiological contrast. (Myelograms, Barium swallow or enema study). . Heart attacks: Avoid any elective procedures or surgeries for the initial 6 months after a "Myocardial Infarction" (Heart Attack). . Blood thinners: It is imperative that you stop these medications before procedures. Let us know if you if you take any blood thinner.  . Infection: Avoid procedures during or within two weeks of an infection (including chest colds or gastrointestinal problems). Symptoms associated with infections include: Localized redness, fever, chills, night sweats or profuse sweating, burning sensation when voiding, cough, congestion, stuffiness, runny nose, sore throat, diarrhea, nausea, vomiting, cold or Flu symptoms, recent or current infections. It is specially important if the infection is over the area that we intend to treat. . Heart and lung problems: Symptoms that may suggest an active cardiopulmonary problem include: cough, chest pain, breathing difficulties or shortness of breath, dizziness, ankle swelling, uncontrolled high or unusually low blood pressure, and/or palpitations. If you are experiencing any of these symptoms, cancel your procedure and contact   your primary care physician for an evaluation.  Remember:  Regular Business hours are:  Monday to Thursday  8:00 AM to 4:00 PM  Provider's Schedule: Milinda Pointer, MD:  Procedure days: Tuesday and Thursday 7:30 AM to 4:00 PM  Gillis Santa, MD:  Procedure days: Monday and Wednesday 7:30 AM to 4:00 PM ____________________________________________________________________________________________   ____________________________________________________________________________________________  Blood Thinners  IMPORTANT NOTICE:  If you take any of these, make sure to notify the nursing staff.  Failure to do so may result in injury.  Recommended time intervals to stop and restart blood-thinners, before & after invasive procedures  Generic Name Brand Name Stop Time. Must be stopped at least this long before procedures. After procedures, wait at least this long before re-starting.  Abciximab Reopro 15 days 2 hrs  Alteplase Activase 10 days 10 days  Anagrelide Agrylin    Apixaban Eliquis 3 days 6 hrs  Cilostazol Pletal 3 days 5 hrs  Clopidogrel Plavix 7-10 days 2 hrs  Dabigatran Pradaxa 5 days 6 hrs  Dalteparin Fragmin 24 hours 4 hrs  Dipyridamole Aggrenox 11days 2 hrs  Edoxaban Lixiana; Savaysa 3 days 2 hrs  Enoxaparin  Lovenox 24 hours 4 hrs  Eptifibatide Integrillin 8 hours 2 hrs  Fondaparinux  Arixtra 72 hours 12 hrs  Prasugrel Effient 7-10 days 6 hrs  Reteplase Retavase 10 days 10 days  Rivaroxaban Xarelto 3 days 6 hrs  Ticagrelor Brilinta 5-7 days 6 hrs  Ticlopidine Ticlid 10-14 days 2 hrs  Tinzaparin Innohep 24 hours 4 hrs  Tirofiban Aggrastat 8 hours 2 hrs  Warfarin Coumadin 5 days 2 hrs   Other medications with blood-thinning effects  Product indications Generic (Brand) names Note  Cholesterol Lipitor Stop 4 days before procedure  Blood thinner (injectable) Heparin (LMW or LMWH Heparin) Stop 24 hours before procedure  Cancer Ibrutinib (Imbruvica) Stop 7 days before procedure  Malaria/Rheumatoid Hydroxychloroquine (Plaquenil) Stop 11 days before procedure  Thrombolytics  10  days before or after procedures   Over-the-counter (OTC) Products with blood-thinning effects  Product Common names Stop Time  Aspirin > 325 mg Goody Powders, Excedrin, etc. 11 days  Aspirin ? 81 mg  7 days  Fish oil  4 days  Garlic supplements  7 days  Ginkgo biloba  36 hours  Ginseng  24 hours  NSAIDs Ibuprofen, Naprosyn, etc. 3 days  Vitamin E  4 days   ____________________________________________________________________________________________  ____________________________________________________________________________________________  General Risks and Possible Complications  Patient Responsibilities: It is important that you read this as it is part of your informed consent. It is our duty to inform you of the risks and possible complications associated with treatments offered to you. It is your responsibility as a patient to read this and to ask questions about anything that is not clear or that you believe was not covered in this document.  Patient's Rights: You have the right to refuse treatment. You also have the right to change your mind, even after initially having agreed to have the treatment done. However, under this last option, if you wait until the last second to change your mind, you may be charged for the materials used up to that point.  Introduction: Medicine is not an Chief Strategy Officer. Everything in Medicine, including the lack of treatment(s), carries the potential for danger, harm, or loss (which is by definition: Risk). In Medicine, a complication is a secondary problem, condition, or disease that can aggravate an already existing one. All treatments carry the risk of possible complications. The fact  that a side effects or complications occurs, does not imply that the treatment was conducted incorrectly. It must be clearly understood that these can happen even when everything is done following the highest safety standards.  No treatment: You can choose not to proceed  with the proposed treatment alternative. The "PRO(s)" would include: avoiding the risk of complications associated with the therapy. The "CON(s)" would include: not getting any of the treatment benefits. These benefits fall under one of three categories: diagnostic; therapeutic; and/or palliative. Diagnostic benefits include: getting information which can ultimately lead to improvement of the disease or symptom(s). Therapeutic benefits are those associated with the successful treatment of the disease. Finally, palliative benefits are those related to the decrease of the primary symptoms, without necessarily curing the condition (example: decreasing the pain from a flare-up of a chronic condition, such as incurable terminal cancer).  General Risks and Complications: These are associated to most interventional treatments. They can occur alone, or in combination. They fall under one of the following six (6) categories: no benefit or worsening of symptoms; bleeding; infection; nerve damage; allergic reactions; and/or death. 1. No benefits or worsening of symptoms: In Medicine there are no guarantees, only probabilities. No healthcare provider can ever guarantee that a medical treatment will work, they can only state the probability that it may. Furthermore, there is always the possibility that the condition may worsen, either directly, or indirectly, as a consequence of the treatment. 2. Bleeding: This is more common if the patient is taking a blood thinner, either prescription or over the counter (example: Goody Powders, Fish oil, Aspirin, Garlic, etc.), or if suffering a condition associated with impaired coagulation (example: Hemophilia, cirrhosis of the liver, low platelet counts, etc.). However, even if you do not have one on these, it can still happen. If you have any of these conditions, or take one of these drugs, make sure to notify your treating physician. 3. Infection: This is more common in patients  with a compromised immune system, either due to disease (example: diabetes, cancer, human immunodeficiency virus [HIV], etc.), or due to medications or treatments (example: therapies used to treat cancer and rheumatological diseases). However, even if you do not have one on these, it can still happen. If you have any of these conditions, or take one of these drugs, make sure to notify your treating physician. 4. Nerve Damage: This is more common when the treatment is an invasive one, but it can also happen with the use of medications, such as those used in the treatment of cancer. The damage can occur to small secondary nerves, or to large primary ones, such as those in the spinal cord and brain. This damage may be temporary or permanent and it may lead to impairments that can range from temporary numbness to permanent paralysis and/or brain death. 5. Allergic Reactions: Any time a substance or material comes in contact with our body, there is the possibility of an allergic reaction. These can range from a mild skin rash (contact dermatitis) to a severe systemic reaction (anaphylactic reaction), which can result in death. 6. Death: In general, any medical intervention can result in death, most of the time due to an unforeseen complication. ____________________________________________________________________________________________   ____________________________________________________________________________________________  Preparing for Procedure with Sedation  Procedure appointments are limited to planned procedures: . No Prescription Refills. . No disability issues will be discussed. . No medication changes will be discussed.  Instructions: . Oral Intake: Do not eat or drink anything for at least 8  hours prior to your procedure. . Transportation: Public transportation is not allowed. Bring an adult driver. The driver must be physically present in our waiting room before any procedure can be  started. Marland Kitchen Physical Assistance: Bring an adult physically capable of assisting you, in the event you need help. This adult should keep you company at home for at least 6 hours after the procedure. . Blood Pressure Medicine: Take your blood pressure medicine with a sip of water the morning of the procedure. . Blood thinners: Notify our staff if you are taking any blood thinners. Depending on which one you take, there will be specific instructions on how and when to stop it. . Diabetics on insulin: Notify the staff so that you can be scheduled 1st case in the morning. If your diabetes requires high dose insulin, take only  of your normal insulin dose the morning of the procedure and notify the staff that you have done so. . Preventing infections: Shower with an antibacterial soap the morning of your procedure. . Build-up your immune system: Take 1000 mg of Vitamin C with every meal (3 times a day) the day prior to your procedure. Marland Kitchen Antibiotics: Inform the staff if you have a condition or reason that requires you to take antibiotics before dental procedures. . Pregnancy: If you are pregnant, call and cancel the procedure. . Sickness: If you have a cold, fever, or any active infections, call and cancel the procedure. . Arrival: You must be in the facility at least 30 minutes prior to your scheduled procedure. . Children: Do not bring children with you. . Dress appropriately: Bring dark clothing that you would not mind if they get stained. . Valuables: Do not bring any jewelry or valuables.  Reasons to call and reschedule or cancel your procedure: (Following these recommendations will minimize the risk of a serious complication.) . Surgeries: Avoid having procedures within 2 weeks of any surgery. (Avoid for 2 weeks before or after any surgery). . Flu Shots: Avoid having procedures within 2 weeks of a flu shots or . (Avoid for 2 weeks before or after immunizations). . Barium: Avoid having a procedure  within 7-10 days after having had a radiological study involving the use of radiological contrast. (Myelograms, Barium swallow or enema study). . Heart attacks: Avoid any elective procedures or surgeries for the initial 6 months after a "Myocardial Infarction" (Heart Attack). . Blood thinners: It is imperative that you stop these medications before procedures. Let us know if you if you take any blood thinner.  . Infection: Avoid procedures during or within two weeks of an infection (including chest colds or gastrointestinal problems). Symptoms associated with infections include: Localized redness, fever, chills, night sweats or profuse sweating, burning sensation when voiding, cough, congestion, stuffiness, runny nose, sore throat, diarrhea, nausea, vomiting, cold or Flu symptoms, recent or current infections. It is specially important if the infection is over the area that we intend to treat. Marland Kitchen Heart and lung problems: Symptoms that may suggest an active cardiopulmonary problem include: cough, chest pain, breathing difficulties or shortness of breath, dizziness, ankle swelling, uncontrolled high or unusually low blood pressure, and/or palpitations. If you are experiencing any of these symptoms, cancel your procedure and contact your primary care physician for an evaluation.  Remember:  Regular Business hours are:  Monday to Thursday 8:00 AM to 4:00 PM  Provider's Schedule: Milinda Pointer, MD:  Procedure days: Tuesday and Thursday 7:30 AM to 4:00 PM  Gillis Santa, MD:  Procedure days:  Monday and Wednesday 7:30 AM to 4:00 PM ____________________________________________________________________________________________   ____________________________________________________________________________________________  General Risks and Possible Complications  Patient Responsibilities: It is important that you read this as it is part of your informed consent. It is our duty to inform you of the risks  and possible complications associated with treatments offered to you. It is your responsibility as a patient to read this and to ask questions about anything that is not clear or that you believe was not covered in this document.  Patient's Rights: You have the right to refuse treatment. You also have the right to change your mind, even after initially having agreed to have the treatment done. However, under this last option, if you wait until the last second to change your mind, you may be charged for the materials used up to that point.  Introduction: Medicine is not an Chief Strategy Officer. Everything in Medicine, including the lack of treatment(s), carries the potential for danger, harm, or loss (which is by definition: Risk). In Medicine, a complication is a secondary problem, condition, or disease that can aggravate an already existing one. All treatments carry the risk of possible complications. The fact that a side effects or complications occurs, does not imply that the treatment was conducted incorrectly. It must be clearly understood that these can happen even when everything is done following the highest safety standards.  No treatment: You can choose not to proceed with the proposed treatment alternative. The "PRO(s)" would include: avoiding the risk of complications associated with the therapy. The "CON(s)" would include: not getting any of the treatment benefits. These benefits fall under one of three categories: diagnostic; therapeutic; and/or palliative. Diagnostic benefits include: getting information which can ultimately lead to improvement of the disease or symptom(s). Therapeutic benefits are those associated with the successful treatment of the disease. Finally, palliative benefits are those related to the decrease of the primary symptoms, without necessarily curing the condition (example: decreasing the pain from a flare-up of a chronic condition, such as incurable terminal cancer).  General  Risks and Complications: These are associated to most interventional treatments. They can occur alone, or in combination. They fall under one of the following six (6) categories: no benefit or worsening of symptoms; bleeding; infection; nerve damage; allergic reactions; and/or death. 7. No benefits or worsening of symptoms: In Medicine there are no guarantees, only probabilities. No healthcare provider can ever guarantee that a medical treatment will work, they can only state the probability that it may. Furthermore, there is always the possibility that the condition may worsen, either directly, or indirectly, as a consequence of the treatment. 8. Bleeding: This is more common if the patient is taking a blood thinner, either prescription or over the counter (example: Goody Powders, Fish oil, Aspirin, Garlic, etc.), or if suffering a condition associated with impaired coagulation (example: Hemophilia, cirrhosis of the liver, low platelet counts, etc.). However, even if you do not have one on these, it can still happen. If you have any of these conditions, or take one of these drugs, make sure to notify your treating physician. 9. Infection: This is more common in patients with a compromised immune system, either due to disease (example: diabetes, cancer, human immunodeficiency virus [HIV], etc.), or due to medications or treatments (example: therapies used to treat cancer and rheumatological diseases). However, even if you do not have one on these, it can still happen. If you have any of these conditions, or take one of these drugs, make sure to notify  your treating physician. 10. Nerve Damage: This is more common when the treatment is an invasive one, but it can also happen with the use of medications, such as those used in the treatment of cancer. The damage can occur to small secondary nerves, or to large primary ones, such as those in the spinal cord and brain. This damage may be temporary or permanent and it  may lead to impairments that can range from temporary numbness to permanent paralysis and/or brain death. 11. Allergic Reactions: Any time a substance or material comes in contact with our body, there is the possibility of an allergic reaction. These can range from a mild skin rash (contact dermatitis) to a severe systemic reaction (anaphylactic reaction), which can result in death. 12. Death: In general, any medical intervention can result in death, most of the time due to an unforeseen complication. ____________________________________________________________________________________________  ____________________________________________________________________________________________  Blood Thinners  IMPORTANT NOTICE:  If you take any of these, make sure to notify the nursing staff.  Failure to do so may result in injury.  Recommended time intervals to stop and restart blood-thinners, before & after invasive procedures  Generic Name Brand Name Stop Time. Must be stopped at least this long before procedures. After procedures, wait at least this long before re-starting.  Abciximab Reopro 15 days 2 hrs  Alteplase Activase 10 days 10 days  Anagrelide Agrylin    Apixaban Eliquis 3 days 6 hrs  Cilostazol Pletal 3 days 5 hrs  Clopidogrel Plavix 7-10 days 2 hrs  Dabigatran Pradaxa 5 days 6 hrs  Dalteparin Fragmin 24 hours 4 hrs  Dipyridamole Aggrenox 11days 2 hrs  Edoxaban Lixiana; Savaysa 3 days 2 hrs  Enoxaparin  Lovenox 24 hours 4 hrs  Eptifibatide Integrillin 8 hours 2 hrs  Fondaparinux  Arixtra 72 hours 12 hrs  Prasugrel Effient 7-10 days 6 hrs  Reteplase Retavase 10 days 10 days  Rivaroxaban Xarelto 3 days 6 hrs  Ticagrelor Brilinta 5-7 days 6 hrs  Ticlopidine Ticlid 10-14 days 2 hrs  Tinzaparin Innohep 24 hours 4 hrs  Tirofiban Aggrastat 8 hours 2 hrs  Warfarin Coumadin 5 days 2 hrs   Other medications with blood-thinning effects  Product indications Generic (Brand) names Note   Cholesterol Lipitor Stop 4 days before procedure  Blood thinner (injectable) Heparin (LMW or LMWH Heparin) Stop 24 hours before procedure  Cancer Ibrutinib (Imbruvica) Stop 7 days before procedure  Malaria/Rheumatoid Hydroxychloroquine (Plaquenil) Stop 11 days before procedure  Thrombolytics  10 days before or after procedures   Over-the-counter (OTC) Products with blood-thinning effects  Product Common names Stop Time  Aspirin > 325 mg Goody Powders, Excedrin, etc. 11 days  Aspirin ? 81 mg  7 days  Fish oil  4 days  Garlic supplements  7 days  Ginkgo biloba  36 hours  Ginseng  24 hours  NSAIDs Ibuprofen, Naprosyn, etc. 3 days  Vitamin E  4 days   ____________________________________________________________________________________________

## 2019-03-26 NOTE — Progress Notes (Deleted)
Nursing Pain Medication Assessment:  Safety precautions to be maintained throughout the outpatient stay will include: orient to surroundings, keep bed in low position, maintain call bell within reach at all times, provide assistance with transfer out of bed and ambulation.  Medication Inspection Compliance: {Blank single:19197::"Mr. Dillahunt did not comply with our request to bring his pills to be counted. He was reminded that bringing the medication bottles, even when empty, is a requirement.","Pill count conducted under aseptic conditions, in front of the patient. Neither the pills nor the bottle was removed from the patient's sight at any time. Once count was completed pills were immediately returned to the patient in their original bottle."}  Medication: {Blank single:19197::"Multiple medications","Buprenorphine (Suboxone)","Butorphanol (Stadol)","Duragesic patch","Fentanyl patch","Hydrocodone/APAP","Hydromorphone (Dilaudid)","Methadone","Morphine IR","Morphine ER (MSContin)","Oxycodone IR","Oxycodone/APAP","Oxycodone ER (OxyContin)","Oxymorphone (Opana)","Tapentadol (Nucynta)","Tramadol (Ultram)","See above"} Pill/Patch Count: {Blank single:19197::"No pills available to be counted.","*** of *** patches remain","*** of *** pills remain"} Pill/Patch Appearance: {Blank single:19197::"No markings","Markings inconsistent with prescribed medication","Markings consistent with prescribed medication"} Bottle Appearance: {Blank single:19197::"No label. Patient informed that medications must be transported in properly and accurately labeled containers.","Non-pharmacy container. Patient reminded that prescription medications must be kept in original, labeled, pharmacy bottle.","Old prescription bottle. Patient reminded that medications should always be kept in the newest prescription bottle.","No container available. Did not bring bottle(s) to appointment.","Standard pharmacy container. Clearly labeled."} Filled  Date: *** / *** / {Blank single:19197::"2020","2019","2018"} Last Medication intake:  {Blank single:19197::"Ran out of medicine more than 48 hours ago","Day before yesterday","Yesterday","Today"}

## 2019-04-02 ENCOUNTER — Telehealth: Payer: Self-pay

## 2019-04-02 NOTE — Telephone Encounter (Signed)
Called and left message with [facility]; clearance to stop Plavix prior to procedure.

## 2019-04-02 NOTE — Telephone Encounter (Signed)
Patient has orders for procedure, has to stop plavix 10 days, has this been approved from heart phys. ? If so we can schedule his procedure.

## 2019-04-02 NOTE — Telephone Encounter (Signed)
He would like the results of the test he had done and wants to know what is next? He thinks it was a MRI. He said it was a lot of pictures...Marland KitchenMarland Kitchen

## 2019-04-02 NOTE — Telephone Encounter (Signed)
Medical Clearance resent to Dr Delana Meyer to stop Plavix prior to bilateral lumbar facet block.

## 2019-04-03 ENCOUNTER — Telehealth: Payer: Self-pay

## 2019-04-04 ENCOUNTER — Other Ambulatory Visit (INDEPENDENT_AMBULATORY_CARE_PROVIDER_SITE_OTHER): Payer: Self-pay | Admitting: Nurse Practitioner

## 2019-04-09 ENCOUNTER — Telehealth: Payer: Self-pay | Admitting: *Deleted

## 2019-04-09 DIAGNOSIS — Z992 Dependence on renal dialysis: Secondary | ICD-10-CM | POA: Diagnosis not present

## 2019-04-09 NOTE — Telephone Encounter (Signed)
Spoke with patient today to let him know that we have not heard anything from [facility]; medical clearance to stop Plavix 7 - 10 days prior to procedure.  I told him that we have faxed the clearance several times as well as reached out via phone to try and get the approved medical clearance.  Patient states that someone called him and he was told to go ahead and stop his Plavix and that he stopped it, is unsure as to when but thinks it has been a little more than a week ago.  I told him I do not see anywhere that he was told to stop his plavix and we were still waiting to hear from Dr Nino Parsley office re; this matter.  He again states that he has already stopped the medication, per the "woman that called him, telling him he needed to be off x 14 days".  I told him I would communicate with Dr Holley Raring and see what he would like to do re; this situation.  Also, patient is asking to be scheduled on a Tue/Th d/t his dialysis schedule on M/W.  I explained that Dr Elmon Else procedure hours are M/W and we would try our best to accommodate his schedule.

## 2019-04-10 NOTE — Telephone Encounter (Signed)
Spoke with patient to let him know that we have received his medical clearance and that his appt is April 17, 2019 @ 0915.  Preparation for sedation prior to procedure instructed and also that he needs a driver for the procedure.  Patient verbalizes u/o instructions.

## 2019-04-12 DIAGNOSIS — N186 End stage renal disease: Secondary | ICD-10-CM | POA: Diagnosis not present

## 2019-04-12 DIAGNOSIS — Z992 Dependence on renal dialysis: Secondary | ICD-10-CM | POA: Diagnosis not present

## 2019-04-13 DIAGNOSIS — Z23 Encounter for immunization: Secondary | ICD-10-CM | POA: Diagnosis not present

## 2019-04-13 DIAGNOSIS — N186 End stage renal disease: Secondary | ICD-10-CM | POA: Diagnosis not present

## 2019-04-13 DIAGNOSIS — Z992 Dependence on renal dialysis: Secondary | ICD-10-CM | POA: Diagnosis not present

## 2019-04-13 DIAGNOSIS — N2581 Secondary hyperparathyroidism of renal origin: Secondary | ICD-10-CM | POA: Diagnosis not present

## 2019-04-17 ENCOUNTER — Ambulatory Visit
Admission: RE | Admit: 2019-04-17 | Discharge: 2019-04-17 | Disposition: A | Discharge status: Home / Self Care | Payer: PPO | Source: Ambulatory Visit | Attending: Pain Medicine | Admitting: Pain Medicine

## 2019-04-17 ENCOUNTER — Encounter: Payer: Self-pay | Admitting: Pain Medicine

## 2019-04-17 ENCOUNTER — Other Ambulatory Visit: Payer: Self-pay

## 2019-04-17 ENCOUNTER — Ambulatory Visit (HOSPITAL_BASED_OUTPATIENT_CLINIC_OR_DEPARTMENT_OTHER): Payer: PPO | Admitting: Pain Medicine

## 2019-04-17 VITALS — BP 127/64 | HR 108 | Temp 97.6°F | Resp 15 | Ht 71.0 in | Wt 319.7 lb

## 2019-04-17 DIAGNOSIS — M545 Low back pain, unspecified: Secondary | ICD-10-CM

## 2019-04-17 DIAGNOSIS — M5137 Other intervertebral disc degeneration, lumbosacral region: Secondary | ICD-10-CM | POA: Diagnosis not present

## 2019-04-17 DIAGNOSIS — M47817 Spondylosis without myelopathy or radiculopathy, lumbosacral region: Secondary | ICD-10-CM | POA: Diagnosis not present

## 2019-04-17 DIAGNOSIS — M47816 Spondylosis without myelopathy or radiculopathy, lumbar region: Secondary | ICD-10-CM | POA: Diagnosis not present

## 2019-04-17 DIAGNOSIS — G8929 Other chronic pain: Secondary | ICD-10-CM | POA: Diagnosis not present

## 2019-04-17 DIAGNOSIS — Z7901 Long term (current) use of anticoagulants: Secondary | ICD-10-CM

## 2019-04-17 MED ORDER — MIDAZOLAM HCL 5 MG/5ML IJ SOLN
1.0000 mg | INTRAMUSCULAR | Status: DC | PRN
  Administered 2019-04-17: 10:00:00 2 mg via INTRAVENOUS
  Filled 2019-04-17: qty 5

## 2019-04-17 MED ORDER — LIDOCAINE HCL 2 % IJ SOLN
20.0000 mL | Freq: Once | INTRAMUSCULAR | Status: AC
  Administered 2019-04-17: 400 mg
  Filled 2019-04-17: qty 20

## 2019-04-17 MED ORDER — LACTATED RINGERS IV SOLN
1000.0000 mL | Freq: Once | INTRAVENOUS | Status: AC
  Administered 2019-04-17: 1000 mL via INTRAVENOUS

## 2019-04-17 MED ORDER — FENTANYL CITRATE (PF) 100 MCG/2ML IJ SOLN
25.0000 ug | INTRAMUSCULAR | Status: DC | PRN
  Administered 2019-04-17: 100 ug via INTRAVENOUS
  Filled 2019-04-17: qty 2

## 2019-04-17 MED ORDER — TRIAMCINOLONE ACETONIDE 40 MG/ML IJ SUSP
80.0000 mg | Freq: Once | INTRAMUSCULAR | Status: AC
  Administered 2019-04-17: 40 mg
  Filled 2019-04-17: qty 2

## 2019-04-17 MED ORDER — ROPIVACAINE HCL 2 MG/ML IJ SOLN
18.0000 mL | Freq: Once | INTRAMUSCULAR | Status: AC
  Administered 2019-04-17: 10 mL via PERINEURAL
  Filled 2019-04-17: qty 20

## 2019-04-17 NOTE — Patient Instructions (Addendum)
____________________________________________________________________________________________  Post-Procedure Discharge Instructions  Instructions:  Apply ice:   Purpose: This will minimize any swelling and discomfort after procedure.   When: Day of procedure, as soon as you get home.  How: Fill a plastic sandwich bag with crushed ice. Cover it with a small towel and apply to injection site.  How long: (15 min on, 15 min off) Apply for 15 minutes then remove x 15 minutes.  Repeat sequence on day of procedure, until you go to bed.  Apply heat:   Purpose: To treat any soreness and discomfort from the procedure.  When: Starting the next day after the procedure.  How: Apply heat to procedure site starting the day following the procedure.  How long: May continue to repeat daily, until discomfort goes away.  Food intake: Start with clear liquids (like water) and advance to regular food, as tolerated.   Physical activities: Keep activities to a minimum for the first 8 hours after the procedure. After that, then as tolerated.  Driving: If you have received any sedation, be responsible and do not drive. You are not allowed to drive for 24 hours after having sedation.  Blood thinner: (Applies only to those taking blood thinners) You may restart your blood thinner 6 hours after your procedure.  Insulin: (Applies only to Diabetic patients taking insulin) As soon as you can eat, you may resume your normal dosing schedule.  Infection prevention: Keep procedure site clean and dry. Shower daily and clean area with soap and water.  Post-procedure Pain Diary: Extremely important that this be done correctly and accurately. Recorded information will be used to determine the next step in treatment. For the purpose of accuracy, follow these rules:  Evaluate only the area treated. Do not report or include pain from an untreated area. For the purpose of this evaluation, ignore all other areas of pain,  except for the treated area.  After your procedure, avoid taking a long nap and attempting to complete the pain diary after you wake up. Instead, set your alarm clock to go off every hour, on the hour, for the initial 8 hours after the procedure. Document the duration of the numbing medicine, and the relief you are getting from it.  Do not go to sleep and attempt to complete it later. It will not be accurate. If you received sedation, it is likely that you were given a medication that may cause amnesia. Because of this, completing the diary at a later time may cause the information to be inaccurate. This information is needed to plan your care.  Follow-up appointment: Keep your post-procedure follow-up evaluation appointment after the procedure (usually 2 weeks for most procedures, 6 weeks for radiofrequencies). DO NOT FORGET to bring you pain diary with you.   Expect: (What should I expect to see with my procedure?)  From numbing medicine (AKA: Local Anesthetics): Numbness or decrease in pain. You may also experience some weakness, which if present, could last for the duration of the local anesthetic.  Onset: Full effect within 15 minutes of injected.  Duration: It will depend on the type of local anesthetic used. On the average, 1 to 8 hours.   From steroids (Applies only if steroids were used): Decrease in swelling or inflammation. Once inflammation is improved, relief of the pain will follow.  Onset of benefits: Depends on the amount of swelling present. The more swelling, the longer it will take for the benefits to be seen. In some cases, up to 10 days.    Duration: Steroids will stay in the system x 2 weeks. Duration of benefits will depend on multiple posibilities including persistent irritating factors.  Side-effects: If present, they may typically last 2 weeks (the duration of the steroids).  Frequent: Cramps (if they occur, drink Gatorade and take over-the-counter Magnesium 450-500 mg  once to twice a day); water retention with temporary weight gain; increases in blood sugar; decreased immune system response; increased appetite.  Occasional: Facial flushing (red, warm cheeks); mood swings; menstrual changes.  Uncommon: Long-term decrease or suppression of natural hormones; bone thinning. (These are more common with higher doses or more frequent use. This is why we prefer that our patients avoid having any injection therapies in other practices.)   Very Rare: Severe mood changes; psychosis; aseptic necrosis.  From procedure: Some discomfort is to be expected once the numbing medicine wears off. This should be minimal if ice and heat are applied as instructed.  Call if: (When should I call?)  You experience numbness and weakness that gets worse with time, as opposed to wearing off.  New onset bowel or bladder incontinence. (Applies only to procedures done in the spine)  Emergency Numbers:  Durning business hours (Monday - Thursday, 8:00 AM - 4:00 PM) (Friday, 9:00 AM - 12:00 Noon): [phone]  After hours: [phone]  NOTE: If you are having a problem and are unable connect with, or to talk to a provider, then go to your nearest urgent care or emergency department. If the problem is serious and urgent, please call 911. ____________________________________________________________________________________________   ____________________________________________________________________________________________  Blood Thinners  IMPORTANT NOTICE:  If you take any of these, make sure to notify the nursing staff.  Failure to do so may result in injury.  Recommended time intervals to stop and restart blood-thinners, before & after invasive procedures  Generic Name Brand Name Stop Time. Must be stopped at least this long before procedures. After procedures, wait at least this long before re-starting.  Abciximab Reopro 15 days 2 hrs  Alteplase Activase 10 days 10 days   Anagrelide Agrylin    Apixaban Eliquis 3 days 6 hrs  Cilostazol Pletal 3 days 5 hrs  Clopidogrel Plavix 7-10 days 2 hrs  Dabigatran Pradaxa 5 days 6 hrs  Dalteparin Fragmin 24 hours 4 hrs  Dipyridamole Aggrenox 11days 2 hrs  Edoxaban Lixiana; Savaysa 3 days 2 hrs  Enoxaparin  Lovenox 24 hours 4 hrs  Eptifibatide Integrillin 8 hours 2 hrs  Fondaparinux  Arixtra 72 hours 12 hrs  Prasugrel Effient 7-10 days 6 hrs  Reteplase Retavase 10 days 10 days  Rivaroxaban Xarelto 3 days 6 hrs  Ticagrelor Brilinta 5-7 days 6 hrs  Ticlopidine Ticlid 10-14 days 2 hrs  Tinzaparin Innohep 24 hours 4 hrs  Tirofiban Aggrastat 8 hours 2 hrs  Warfarin Coumadin 5 days 2 hrs   Other medications with blood-thinning effects  Product indications Generic (Brand) names Note  Cholesterol Lipitor Stop 4 days before procedure  Blood thinner (injectable) Heparin (LMW or LMWH Heparin) Stop 24 hours before procedure  Cancer Ibrutinib (Imbruvica) Stop 7 days before procedure  Malaria/Rheumatoid Hydroxychloroquine (Plaquenil) Stop 11 days before procedure  Thrombolytics  10 days before or after procedures   Over-the-counter (OTC) Products with blood-thinning effects  Product Common names Stop Time  Aspirin > 325 mg Goody Powders, Excedrin, etc. 11 days  Aspirin ? 81 mg  7 days  Fish oil  4 days  Garlic supplements  7 days  Ginkgo biloba  36 hours  Ginseng  24 hours  NSAIDs Ibuprofen, Naprosyn, etc. 3 days  Vitamin E  4 days   ____________________________________________________________________________________________

## 2019-04-17 NOTE — Progress Notes (Signed)
PROVIDER NOTE: Information contained herein reflects review and annotations entered in association with encounter. Interpretation of such information and data should be left to medically-trained personnel. Information provided to patient can be located elsewhere in the medical record under "Patient Instructions". Document created using STT-dictation technology, any transcriptional errors that may result from process are unintentional.   Patient's Name: Donald Fernandez  MRN: [id number]  Referring Provider: Denton Lank, MD  DOB: 05/20/1970  PCP: Denton Lank, MD  DOS: 04/17/2019  Note by: [name], MD  Service setting: Ambulatory outpatient  Specialty: Interventional Pain Management  Patient type: Established  Location: [facility] (AMB) Pain Management Facility  Visit type: Interventional Procedure   Primary Reason for Visit: Interventional Pain Management Treatment. CC: Back Pain (low and upper)  Procedure:          Anesthesia, Analgesia, Anxiolysis:  Type: Lumbar Facet, Medial Branch Block(s) #1  Primary Purpose: Diagnostic Region: Posterolateral Lumbosacral Spine Level: L2, L3, L4, L5, & S1 Medial Branch Level(s). Injecting these levels blocks the L3-4, L4-5, and L5-S1 lumbar facet joints. Laterality: Bilateral  Type: Moderate (Conscious) Sedation combined with Local Anesthesia Indication(s): Analgesia and Anxiety Route: Intravenous (IV) IV Access: Secured Sedation: Meaningful verbal contact was maintained at all times during the procedure  Local Anesthetic: Lidocaine 1-2%  Position: Prone   Indications: 1. Lumbar facet syndrome (Bilateral) (L>R)   2. Spondylosis without myelopathy or radiculopathy, lumbosacral region   3. Lumbar facet hypertrophy (Multilevel) (Bilateral)   4. DDD (degenerative disc disease), lumbosacral   5. Chronic low back pain (Primary Area of Pain) (Bilateral) (L>R) w/o sciatica   6. Chronic anticoagulation (Plavix)    Pain Score: Pre-procedure: 8  /10 Post-procedure: 1 /10   Pre-op Assessment:  Donald Fernandez is a 49 y.o. (year old), male patient, seen today for interventional treatment. He  has a past surgical history that includes Ankle surgery (Right); AV fistula placement (Left, 08/04/2016); IR ANGIO INTRA EXTRACRAN SEL INTERNAL CAROTID UNI R MOD SED (12/17/2016); Radiology with anesthesia (N/A, 12/17/2016); DIALYSIS/PERMA CATHETER INSERTION (N/A, 06/06/2017); A/V Fistulagram (N/A, 06/08/2017); AV Fistula Insertion w/RF Magnetic Guidance (Left, 09/02/2017); Upper Extremity Angiography (Left, 10/19/2017); Revison of arteriovenous fistula (Left, 01/04/2018); and DIALYSIS/PERMA CATHETER REMOVAL (N/A, 05/04/2018). Donald Fernandez has a current medication list which includes the following prescription(s): amlodipine, anastrozole, aspirin ec, atorvastatin, azelastine, calcium acetate, carvedilol, cholecalciferol, clomiphene, clonidine, clopidogrel, cyclobenzaprine, famotidine, fluticasone, furosemide, gabapentin, montelukast, oxycodone-acetaminophen, sodium bicarbonate, tadalafil, tamsulosin, testosterone, vitamin e, prednisone, and topiramate, and the following Facility-Administered Medications: fentanyl and midazolam. His primarily concern today is the Back Pain (low and upper)  Initial Vital Signs:  Pulse/HCG Rate: (!) 108ECG Heart Rate: (!) 102 Temp: (!) 97.3 F (36.3 C) Resp: 18 BP: (!) 145/87 SpO2: 100 %  BMI: Estimated body mass index is 44.58 kg/m as calculated from the following:   Height as of this encounter: 5\' 11"  (1.803 m).   Weight as of this encounter: 319 lb 10.7 oz (145 kg).  Risk Assessment: Allergies: Reviewed. He is allergic to accupril [quinapril hcl] and ace inhibitors.  Allergy Precautions: None required Coagulopathies: Reviewed. None identified.  Blood-thinner therapy: None at this time Active Infection(s): Reviewed. None identified. Donald Fernandez is afebrile  Site Confirmation: Donald Fernandez was asked to confirm the procedure and  laterality before marking the site Procedure checklist: Completed Consent: Before the procedure and under the influence of no sedative(s), amnesic(s), or anxiolytics, the patient was informed of the treatment options, risks and possible complications. To fulfill our ethical and legal  obligations, as recommended by the American Medical Association's Code of Ethics, I have informed the patient of my clinical impression; the nature and purpose of the treatment or procedure; the risks, benefits, and possible complications of the intervention; the alternatives, including doing nothing; the risk(s) and benefit(s) of the alternative treatment(s) or procedure(s); and the risk(s) and benefit(s) of doing nothing. The patient was provided information about the general risks and possible complications associated with the procedure. These may include, but are not limited to: failure to achieve desired goals, infection, bleeding, organ or nerve damage, allergic reactions, paralysis, and death. In addition, the patient was informed of those risks and complications associated to Spine-related procedures, such as failure to decrease pain; infection (i.e.: Meningitis, epidural or intraspinal abscess); bleeding (i.e.: epidural hematoma, subarachnoid hemorrhage, or any other type of intraspinal or peri-dural bleeding); organ or nerve damage (i.e.: Any type of peripheral nerve, nerve root, or spinal cord injury) with subsequent damage to sensory, motor, and/or autonomic systems, resulting in permanent pain, numbness, and/or weakness of one or several areas of the body; allergic reactions; (i.e.: anaphylactic reaction); and/or death. Furthermore, the patient was informed of those risks and complications associated with the medications. These include, but are not limited to: allergic reactions (i.e.: anaphylactic or anaphylactoid reaction(s)); adrenal axis suppression; blood sugar elevation that in diabetics may result in  ketoacidosis or comma; water retention that in patients with history of congestive heart failure may result in shortness of breath, pulmonary edema, and decompensation with resultant heart failure; weight gain; swelling or edema; medication-induced neural toxicity; particulate matter embolism and blood vessel occlusion with resultant organ, and/or nervous system infarction; and/or aseptic necrosis of one or more joints. Finally, the patient was informed that Medicine is not an exact science; therefore, there is also the possibility of unforeseen or unpredictable risks and/or possible complications that may result in a catastrophic outcome. The patient indicated having understood very clearly. We have given the patient no guarantees and we have made no promises. Enough time was given to the patient to ask questions, all of which were answered to the patient's satisfaction. Donald Fernandez has indicated that he wanted to continue with the procedure. Attestation: I, the ordering provider, attest that I have discussed with the patient the benefits, risks, side-effects, alternatives, likelihood of achieving goals, and potential problems during recovery for the procedure that I have provided informed consent. Date  Time: 04/17/2019  9:13 AM  Pre-Procedure Preparation:  Monitoring: As per clinic protocol. Respiration, ETCO2, SpO2, BP, heart rate and rhythm monitor placed and checked for adequate function Safety Precautions: Patient was assessed for positional comfort and pressure points before starting the procedure. Time-out: I initiated and conducted the "Time-out" before starting the procedure, as per protocol. The patient was asked to participate by confirming the accuracy of the "Time Out" information. Verification of the correct person, site, and procedure were performed and confirmed by me, the nursing staff, and the patient. "Time-out" conducted as per Joint Commission's Universal Protocol (UP.01.01.01). Time:  0951  Description of Procedure:          Laterality: Bilateral. The procedure was performed in identical fashion on both sides. Levels:  L2, L3, L4, L5, & S1 Medial Branch Level(s) Area Prepped: Posterior Lumbosacral Region Prepping solution: DuraPrep (Iodine Povacrylex [0.7% available iodine] and Isopropyl Alcohol, 74% w/w) Safety Precautions: Aspiration looking for blood return was conducted prior to all injections. At no point did we inject any substances, as a needle was being advanced. Before injecting,  the patient was told to immediately notify me if he was experiencing any new onset of "ringing in the ears, or metallic taste in the mouth". No attempts were made at seeking any paresthesias. Safe injection practices and needle disposal techniques used. Medications properly checked for expiration dates. SDV (single dose vial) medications used. After the completion of the procedure, all disposable equipment used was discarded in the proper designated medical waste containers. Local Anesthesia: Protocol guidelines were followed. The patient was positioned over the fluoroscopy table. The area was prepped in the usual manner. The time-out was completed. The target area was identified using fluoroscopy. A 12-in long, straight, sterile hemostat was used with fluoroscopic guidance to locate the targets for each level blocked. Once located, the skin was marked with an approved surgical skin marker. Once all sites were marked, the skin (epidermis, dermis, and hypodermis), as well as deeper tissues (fat, connective tissue and muscle) were infiltrated with a small amount of a short-acting local anesthetic, loaded on a 10cc syringe with a 25G, 1.5-in  Needle. An appropriate amount of time was allowed for local anesthetics to take effect before proceeding to the next step. Local Anesthetic: Lidocaine 2.0% The unused portion of the local anesthetic was discarded in the proper designated containers. Technical  explanation of process:  L2 Medial Branch Nerve Block (MBB): The target area for the L2 medial branch is at the junction of the postero-lateral aspect of the superior articular process and the superior, posterior, and medial edge of the transverse process of L3. Under fluoroscopic guidance, a Quincke needle was inserted until contact was made with os over the superior postero-lateral aspect of the pedicular shadow (target area). After negative aspiration for blood, 0.5 mL of the nerve block solution was injected without difficulty or complication. The needle was removed intact. L3 Medial Branch Nerve Block (MBB): The target area for the L3 medial branch is at the junction of the postero-lateral aspect of the superior articular process and the superior, posterior, and medial edge of the transverse process of L4. Under fluoroscopic guidance, a Quincke needle was inserted until contact was made with os over the superior postero-lateral aspect of the pedicular shadow (target area). After negative aspiration for blood, 0.5 mL of the nerve block solution was injected without difficulty or complication. The needle was removed intact. L4 Medial Branch Nerve Block (MBB): The target area for the L4 medial branch is at the junction of the postero-lateral aspect of the superior articular process and the superior, posterior, and medial edge of the transverse process of L5. Under fluoroscopic guidance, a Quincke needle was inserted until contact was made with os over the superior postero-lateral aspect of the pedicular shadow (target area). After negative aspiration for blood, 0.5 mL of the nerve block solution was injected without difficulty or complication. The needle was removed intact. L5 Medial Branch Nerve Block (MBB): The target area for the L5 medial branch is at the junction of the postero-lateral aspect of the superior articular process and the superior, posterior, and medial edge of the sacral ala. Under  fluoroscopic guidance, a Quincke needle was inserted until contact was made with os over the superior postero-lateral aspect of the pedicular shadow (target area). After negative aspiration for blood, 0.5 mL of the nerve block solution was injected without difficulty or complication. The needle was removed intact. S1 Medial Branch Nerve Block (MBB): The target area for the S1 medial branch is at the posterior and inferior 6 o'clock position of the L5-S1  facet joint. Under fluoroscopic guidance, the Quincke needle inserted for the L5 MBB was redirected until contact was made with os over the inferior and postero aspect of the sacrum, at the 6 o' clock position under the L5-S1 facet joint (Target area). After negative aspiration for blood, 0.5 mL of the nerve block solution was injected without difficulty or complication. The needle was removed intact.  Nerve block solution: 0.2% PF-Ropivacaine + Triamcinolone (40 mg/mL) diluted to a final concentration of 4 mg of Triamcinolone/mL of Ropivacaine The unused portion of the solution was discarded in the proper designated containers. Procedural Needles: 22-gauge, 3.5-inch, Quincke needles used for all levels.  Once the entire procedure was completed, the treated area was cleaned, making sure to leave some of the prepping solution back to take advantage of its long term bactericidal properties.   Illustration of the posterior view of the lumbar spine and the posterior neural structures. Laminae of L2 through S1 are labeled. DPRL5, dorsal primary ramus of L5; DPRS1, dorsal primary ramus of S1; DPR3, dorsal primary ramus of L3; FJ, facet (zygapophyseal) joint L3-L4; I, inferior articular process of L4; LB1, lateral branch of dorsal primary ramus of L1; IAB, inferior articular branches from L3 medial branch (supplies L4-L5 facet joint); IBP, intermediate branch plexus; MB3, medial branch of dorsal primary ramus of L3; NR3, third lumbar nerve root; S, superior  articular process of L5; SAB, superior articular branches from L4 (supplies L4-5 facet joint also); TP3, transverse process of L3.  Vitals:   04/17/19 1007 04/17/19 1015 04/17/19 1025 04/17/19 1035  BP: (!) 208/152 122/71 111/62 127/64  Pulse:      Resp: 20 15 14 15   Temp:  97.7 F (36.5 C)  97.6 F (36.4 C)  TempSrc:  Temporal  Temporal  SpO2: 94% 95%  100%  Weight:      Height:         Start Time: 0951 hrs. End Time: 1006 hrs.  Imaging Guidance (Spinal):          Type of Imaging Technique: Fluoroscopy Guidance (Spinal) Indication(s): Assistance in needle guidance and placement for procedures requiring needle placement in or near specific anatomical locations not easily accessible without such assistance. Exposure Time: Please see nurses notes. Contrast: None used. Fluoroscopic Guidance: I was personally present during the use of fluoroscopy. "Tunnel Vision Technique" used to obtain the best possible view of the target area. Parallax error corrected before commencing the procedure. "Direction-depth-direction" technique used to introduce the needle under continuous pulsed fluoroscopy. Once target was reached, antero-posterior, oblique, and lateral fluoroscopic projection used confirm needle placement in all planes. Images permanently stored in EMR. Interpretation: No contrast injected. I personally interpreted the imaging intraoperatively. Adequate needle placement confirmed in multiple planes. Permanent images saved into the patient's record.  Antibiotic Prophylaxis:   Anti-infectives (From admission, onward)   None     Indication(s): None identified  Post-operative Assessment:  Post-procedure Vital Signs:  Pulse/HCG Rate: (!) 10893 Temp: 97.6 F (36.4 C) Resp: 15 BP: 127/64 SpO2: 100 %  EBL: None  Complications: No immediate post-treatment complications observed by team, or reported by patient.  Note: The patient tolerated the entire procedure well. A repeat set of  vitals were taken after the procedure and the patient was kept under observation following institutional policy, for this type of procedure. Post-procedural neurological assessment was performed, showing return to baseline, prior to discharge. The patient was provided with post-procedure discharge instructions, including a section on how to identify potential problems.  Should any problems arise concerning this procedure, the patient was given instructions to immediately contact us, at any time, without hesitation. In any case, we plan to contact the patient by telephone for a follow-up status report regarding this interventional procedure.  Comments:  No additional relevant information.  Plan of Care  Orders:  Orders Placed This Encounter  Procedures  . L-FCT Blk (Today)    Scheduling Instructions:     Procedure: Lumbar facet block (AKA.: Lumbosacral medial branch nerve block)     Side: Bilateral     Level: L3-4, L4-5, & L5-S1 Facets (L2, L3, L4, L5, & S1 Medial Branch Nerves)     Sedation: Patient's choice.     Timeframe: Today    Order Specific Question:   Where will this procedure be performed?    Answer:   [facility]  . Fluoro (C-Arm) (<60 min) (No Report)    Intraoperative interpretation by procedural physician at [place].    Standing Status:   Standing    Number of Occurrences:   1    Order Specific Question:   Reason for exam:    Answer:   Assistance in needle guidance and placement for procedures requiring needle placement in or near specific anatomical locations not easily accessible without such assistance.  . Consent: L-FCT BLK    Nursing Order: Transcribe to consent form and obtain patient signature. Note: Always confirm laterality of pain with Mr. Filkins, before procedure. Procedure: Lumbar Facet Block  under fluoroscopic guidance Indication/Reason: Low Back Pain, with our without leg pain, due to Facet Joint Arthralgia (Joint Pain) known as Lumbar  Facet Syndrome, secondary to Lumbar, and/or Lumbosacral Spondylosis (Arthritis of the Spine), without myelopathy or radiculopathy (Nerve Damage). Provider Attestation: I, [place] Dossie Arbour, MD, (Pain Management Specialist), the physician/practitioner, attest that I have discussed with the patient the benefits, risks, side effects, alternatives, likelihood of achieving goals and potential problems during recovery for the procedure that I have provided informed consent.  . Care order/instruction: Please confirm that the patient has stopped the Plavix (Clopidogrel) x 7-10 days prior to procedure or surgery.    Please confirm that the patient has stopped the Plavix (Clopidogrel) x 7-10 days prior to procedure or surgery.    Standing Status:   Standing    Number of Occurrences:   1  . Block Tray    Equipment required: Single use, disposable, "Block Tray"    Standing Status:   Standing    Number of Occurrences:   1    Order Specific Question:   Specify    Answer:   Block Tray  . Bleeding precautions    Standing Status:   Standing    Number of Occurrences:   1   Chronic Opioid Analgesic:  Oxycodone/APAP 5/325 1 tab PO 4 hours (filled 03/19/2019) (Written by Jarvis Morgan, PA-C) Highest recorded MME/day: 50 mg/day MME/day: 50 mg/day   Medications ordered for procedure: Meds ordered this encounter  Medications  . lidocaine (XYLOCAINE) 2 % (with pres) injection 400 mg  . lactated ringers infusion 1,000 mL  . midazolam (VERSED) 5 MG/5ML injection 1-2 mg    Make sure Flumazenil is available in the pyxis when using this medication. If oversedation occurs, administer 0.2 mg IV over 15 sec. If after 45 sec no response, administer 0.2 mg again over 1 min; may repeat at 1 min intervals; not to exceed 4 doses (1 mg)  . fentaNYL (SUBLIMAZE) injection 25-50 mcg  Make sure Narcan is available in the pyxis when using this medication. In the event of respiratory depression (RR< 8/min): Titrate NARCAN  (naloxone) in increments of 0.1 to 0.2 mg IV at 2-3 minute intervals, until desired degree of reversal.  . ropivacaine (PF) 2 mg/mL (0.2%) (NAROPIN) injection 18 mL  . triamcinolone acetonide (KENALOG-40) injection 80 mg   Medications administered: We administered lidocaine, lactated ringers, midazolam, fentaNYL, ropivacaine (PF) 2 mg/mL (0.2%), and triamcinolone acetonide.  See the medical record for exact dosing, route, and time of administration.  Follow-up plan:   Return in about 2 weeks (around 05/01/2019) for (VV), (PP).       Interventional treatment options: Planned, scheduled, and/or pending:   Diagnostic bilateral lumbar facet block #1 under fluoroscopic guidance and IV sedation (today)   Under consideration NOTE: PLAVIX ANTICOAGULATION (Stop: 7-10 days  Re-start: 2 hrs) Diagnostic bilateral lumbar facet block #2  Diagnostic bilateral intra-articular hip joint injections  Palliative bilateral knee joint injections    Therapeutic/palliative (PRN):   None at this time    Recent Visits Date Type Provider Dept  03/26/19 Telemedicine Milinda Pointer, MD [facility]  Showing recent visits within past 90 days and meeting all other requirements   Today's Visits Date Type Provider Dept  04/17/19 Procedure visit Milinda Pointer, MD [facility]  Showing today's visits and meeting all other requirements   Future Appointments Date Type Provider Dept  05/02/19 Appointment Milinda Pointer, MD [facility]  Showing future appointments within next 90 days and meeting all other requirements   Disposition: Discharge home  Discharge Date & Time: 04/17/2019; 1043 hrs.   Primary Care Physician: Denton Lank, MD Location: [facility] Note by: [name], MD Date: 04/17/2019; Time: 11:00 AM  Disclaimer:  Medicine is not an Chief Strategy Officer. The only guarantee in medicine is that nothing is guaranteed. It is  important to note that the decision to proceed with this intervention was based on the information collected from the patient. The Data and conclusions were drawn from the patient's questionnaire, the interview, and the physical examination. Because the information was provided in large part by the patient, it cannot be guaranteed that it has not been purposely or unconsciously manipulated. Every effort has been made to obtain as much relevant data as possible for this evaluation. It is important to note that the conclusions that lead to this procedure are derived in large part from the available data. Always take into account that the treatment will also be dependent on availability of resources and existing treatment guidelines, considered by other Pain Management Practitioners as being common knowledge and practice, at the time of the intervention. For Medico-Legal purposes, it is also important to point out that variation in procedural techniques and pharmacological choices are the acceptable norm. The indications, contraindications, technique, and results of the above procedure should only be interpreted and judged by a Board-Certified Interventional Pain Specialist with extensive familiarity and expertise in the same exact procedure and technique.

## 2019-04-17 NOTE — Progress Notes (Signed)
Safety precautions to be maintained throughout the outpatient stay will include: orient to surroundings, keep bed in low position, maintain call bell within reach at all times, provide assistance with transfer out of bed and ambulation.  

## 2019-04-18 ENCOUNTER — Telehealth: Payer: Self-pay | Admitting: Pain Medicine

## 2019-04-18 ENCOUNTER — Telehealth: Payer: Self-pay | Admitting: *Deleted

## 2019-04-18 NOTE — Telephone Encounter (Signed)
Called to check on post procedure. No answer. LVM.

## 2019-04-18 NOTE — Telephone Encounter (Signed)
Patient states his pain was better yesterday after procedure, now he is hurting just as bad or worse than he was in both buttocks. Please call patient asap

## 2019-04-18 NOTE — Telephone Encounter (Signed)
Patient informed that it is expected that he will have increased pain for a few days following procedure. Denies loss of bowel or bladder control, loss of control of legs.  Instructed patient to document pain symptoms on his diary so that it can be discussed with Dr. Dossie Arbour at follow-up.

## 2019-04-23 NOTE — Telephone Encounter (Signed)
error 

## 2019-04-25 DIAGNOSIS — R509 Fever, unspecified: Secondary | ICD-10-CM | POA: Diagnosis not present

## 2019-05-01 NOTE — Progress Notes (Signed)
Pain relief after procedure (treated area only): (Questions asked to patient) 1. Starting about 15 minutes after the procedure, and "while the area was still numb" (from the local anesthetics), were you having any of your usual pain "in that area" (the treated area)?  (NOTE: NOT including the discomfort from the needle sticks.) First 1 hour: 75 % better. First 4-6 hours: 75% better.  3. How much better is your pain now, when compared to before the procedure? Current benefit: 75 % better. Lasting 2 weeks 4. Can you move better now? Improvement in ROM (Range of Motion): Yes. 5. Can you do more now? Improvement in function: Yes. 4. Did you have any problems with the procedure? Side-effects/Complications: No.

## 2019-05-01 NOTE — Progress Notes (Signed)
Patient: Donald Fernandez  Service Category: E/M  Provider: [name], MD  DOB: Dec 06, 1970  DOS: 05/02/2019  Location: Office  MRN: [id number]  Setting: Ambulatory outpatient  Referring Provider: Denton Lank, MD  Type: Established Patient  Specialty: Interventional Pain Management  PCP: Denton Lank, MD  Location: Remote location  Delivery: TeleHealth     Virtual Encounter - Pain Management PROVIDER NOTE: Information contained herein reflects review and annotations entered in association with encounter. Interpretation of such information and data should be left to medically-trained personnel. Information provided to patient can be located elsewhere in the medical record under "Patient Instructions". Document created using STT-dictation technology, any transcriptional errors that may result from process are unintentional.    Contact & Pharmacy Preferred: [phone] Home: [phone] (home) Mobile: [phone] (mobile) E-mail: [email or web address]  [facility] [address] ([address]), [place]) [place] [address] Phone: [phone] Fax: [phone]   Pre-screening  Mr. Polit offered "in-person" vs "virtual" encounter. He indicated preferring virtual for this encounter.   Reason COVID-19*  Social distancing based on CDC and AMA recommendations.   I contacted Silvio Clayman on 05/02/2019 via telephone.      I clearly identified myself as 05/15/2019, MD. I verified that I was speaking with the correct person using two identifiers (Name: KAHAAN ALVIZO, and date of birth: 1970/08/26).  Consent I sought verbal advanced consent from 21/09/1970 for virtual visit interactions. I informed Mr. Hoopes of possible security and privacy concerns, risks, and limitations associated with providing "not-in-person" medical evaluation and management services. I also informed Mr. Dudziak of the availability of "in-person"  appointments. Finally, I informed him that there would be a charge for the virtual visit and that he could be  personally, fully or partially, financially responsible for it. Mr. Hardnett expressed understanding and agreed to proceed.   Historic Elements   Mr. TRAVARIUS BACHER is a 49 y.o. year old, male patient evaluated today after his last encounter by our practice on 04/18/2019. Mr. Levison  has a past medical history of Arthritis, Asthma, BPH (benign prostatic hyperplasia), Diabetes mellitus without complication ([place]), Dialysis patient [facility]), GERD (gastroesophageal reflux disease), Gout, Hyperlipidemia, Hypertension, Neuropathy, Renal disorder, Restless legs, and Sleep apnea. He also  has a past surgical history that includes Ankle surgery (Right); AV fistula placement (Left, 08/04/2016); IR ANGIO INTRA EXTRACRAN SEL INTERNAL CAROTID UNI R MOD SED (12/17/2016); Radiology with anesthesia (N/A, 12/17/2016); DIALYSIS/PERMA CATHETER INSERTION (N/A, 06/06/2017); A/V Fistulagram (N/A, 06/08/2017); AV Fistula Insertion w/RF Magnetic Guidance (Left, 09/02/2017); Upper Extremity Angiography (Left, 10/19/2017); Revison of arteriovenous fistula (Left, 01/04/2018); and DIALYSIS/PERMA CATHETER REMOVAL (N/A, 05/04/2018). Mr. Pilotti has a current medication list which includes the following prescription(s): amlodipine, anastrozole, aspirin ec, atorvastatin, azelastine, calcium acetate, carvedilol, cholecalciferol, clomiphene, clonidine, clopidogrel, cyclobenzaprine, famotidine, fluticasone, furosemide, gabapentin, montelukast, oxycodone-acetaminophen, sodium bicarbonate, tadalafil, tamsulosin, testosterone, topiramate, vitamin e, and prednisone. He  reports that he has been smoking cigarettes. He has never used smokeless tobacco. He reports current alcohol use. He reports current drug use. Drug: Marijuana. Mr. Zempel is allergic to accupril [quinapril hcl] and ace inhibitors.   HPI  Today, he is being contacted for a post-procedure  assessment.  When I spoke to the patient personally about the results of the procedure, I was able to clarify that for the initial 4 to 6 hours he was not experiencing any pain in the lower back or buttocks.  He also indicated that  24 hours after the procedure, the pain came back with a vengeance and it was not until the end of that initial week that he got the second wave of relief which has provided him with 75% relief of the pain.  He indicates that currently the only pain that he has is in the lower buttocks area, bilaterally.  Today I took the time to explain to the patient about the diagnostic injections and the significance of the local anesthetic and the steroids and helping [place] make that diagnosis.  I have also explained to him that what he is suffering from is a bilateral lumbar facet syndrome, secondary to osteoarthritis of the lumbar facet joints and hypertrophy.  I took the opportunity to explain to the patient the normal course of events associated with the long-term facet syndrome and hypertrophy.  I explained to him that this is a permanent problem that we can probably get the pain under control, but we will not be able to reverse the effects of the degenerative joint disease.  I also explained to him how this can continue to progress through the facet hypertrophy into foraminal stenosis and central spinal stenosis that may lead to the need for decompressive surgery and possible fusion.  I talked to him about the mechanism on how this degenerative joint disease came to be including the fact that he is morbidly obese and this has been contributing significantly to putting pressure on those joints creating the compensatory hypertrophy, which she eventually will lead to more serious problems.  I have recommended to him to start bringing his BMI down to less than 30, which in his case means bringing his weight down to less than 210 pounds.  Right now, he is 109 pounds above our goal of a BMI of 30.  I  recommended that he get assistance with a nutritionist and perhaps consider the possibility of bariatric surgery in order to accomplish this goal.  Bringing his weight down to that level will probably significantly help his low back pain and his symptoms and will definitely slow down the progression of his facet disease.  I also took the time to lay out for him a possible plan of action including a second diagnostic lumbar facet block, followed by radiofrequency ablation if he can bring his BMI to 35.  He understood and accepted and he has requested that we schedule him for that second injection.  Post-Procedure Evaluation  Procedure: Diagnostic bilateral lumbar facet block #1 under fluoroscopic guidance and IV sedation Pre-procedure pain level: 8/10 Post-procedure: 1/10 (100% relief)  Sedation: Sedation provided.  Dewayne Shorter, RN  05/01/2019 10:49 AM  Sign when Signing Visit Pain relief after procedure (treated area only): (Questions asked to patient) 1. Starting about 15 minutes after the procedure, and "while the area was still numb" (from the local anesthetics), were you having any of your usual pain "in that area" (the treated area)?  (NOTE: NOT including the discomfort from the needle sticks.) First 1 hour: 100 % better. First 4-6 hours: 100% better.  3. How much better is your pain now, when compared to before the procedure? Current benefit: 75 % better. Lasting 2 weeks 4. Can you move better now? Improvement in ROM (Range of Motion): Yes. 5. Can you do more now? Improvement in function: Yes. 4. Did you have any problems with the procedure? Side-effects/Complications: No.  Current benefits: Defined as benefit that persist at this time.   Analgesia:  >75% relief.  The only area  that appears to continue to be bothering him is the lower buttocks area. Function: Mr. Domanico reports improvement in function ROM: Mr. Donaghey reports improvement in ROM  Pharmacotherapy Assessment   Analgesic: No opioid analgesics prescribed by our practice. Oxycodone/APAP 5/325 1 tab PO 4 hours (Written by Jarvis Morgan, PA-C) Highest recorded MME/day: 50 mg/day MME/day: 50 mg/day   Monitoring: Pharmacotherapy: No side-effects or adverse reactions reported. [place] PMP: PDMP reviewed during this encounter.       Compliance: No problems identified. Effectiveness: Clinically acceptable. Plan: Refer to "POC".  UDS: No results found for: SUMMARY Laboratory Chemistry Profile (12 mo)  Renal: No results found for requested labs within last 8760 hours.  Lab Results  Component Value Date   GFRAA 5 (L) 12/14/2017   GFRNONAA 4 (L) 12/14/2017   Hepatic: No results found for requested labs within last 8760 hours. Lab Results  Component Value Date   AST 35 06/21/2017   ALT 18 06/21/2017   Other: No results found for requested labs within last 8760 hours.  Note: Above Lab results reviewed.  Imaging  Fluoro (C-Arm) (<60 min) (No Report) Fluoro was used, but no Radiologist interpretation will be provided.  Please refer to "NOTES" tab for provider progress note.   Assessment  The primary encounter diagnosis was Lumbar facet syndrome (Bilateral) (L>R). Diagnoses of Chronic low back pain (Primary Area of Pain) (Bilateral) (L>R) w/o sciatica, Lumbar facet hypertrophy (Multilevel) (Bilateral), Osteoarthritis of facet joint of lumbar spine, and Chronic anticoagulation (Plavix) were also pertinent to this visit.  Plan of Care  Problem-specific:  No problem-specific Assessment & Plan notes found for this encounter.  I am having Tre A. Dabdoub maintain his tamsulosin, furosemide, amLODipine, atorvastatin, aspirin EC, gabapentin, cloNIDine, fluticasone, Cholecalciferol, Testosterone, topiramate, calcium acetate, carvedilol, clopidogrel, anastrozole, azelastine, clomiPHENE, famotidine, montelukast, sodium bicarbonate, tadalafil, vitamin E, predniSONE, oxyCODONE-acetaminophen, and  cyclobenzaprine.  Pharmacotherapy (Medications Ordered): No orders of the defined types were placed in this encounter.  Orders:  Orders Placed This Encounter  Procedures  . LUMBAR FACET(MEDIAL BRANCH NERVE BLOCK) MBNB    Standing Status:   Future    Standing Expiration Date:   06/02/2019    Scheduling Instructions:     Procedure: Lumbar facet block (AKA.: Lumbosacral medial branch nerve block)     Side: Bilateral     Level: L3-4, L4-5, & L5-S1 Facets (L2, L3, L4, L5, & S1 Medial Branch Nerves)     Sedation: Patient's choice.     Timeframe: ASAA    Order Specific Question:   Where will this procedure be performed?    Answer:   [facility]   Follow-up plan:   Return for Procedure (w/ sedation): (B) L-FCT BLK #2, (Blood-thinner Protocol).      Interventional treatment options: Planned, scheduled, and/or pending:   Diagnostic bilateral lumbar facet block #1 under fluoroscopic guidance and IV sedation (today)   Under consideration NOTE: PLAVIX ANTICOAGULATION (Stop: 7-10 days  Re-start: 2 hrs) Diagnostic bilateral lumbar facet block #2  Diagnostic bilateral intra-articular hip joint injections  Palliative bilateral knee joint injections    Therapeutic/palliative (PRN):   None at this time     Recent Visits Date Type Provider Dept  04/17/19 Procedure visit Milinda Pointer, MD [facility]  03/26/19 Telemedicine Milinda Pointer, MD [facility]  Showing recent visits within past 90 days and meeting all other requirements   Today's Visits Date Type Provider Dept  05/02/19 Telemedicine Milinda Pointer, MD [facility]  Showing  today's visits and meeting all other requirements   Future Appointments No visits were found meeting these conditions.  Showing future appointments within next 90 days and meeting all other requirements   I discussed the assessment and treatment plan with the patient. The patient was provided an opportunity  to ask questions and all were answered. The patient agreed with the plan and demonstrated an understanding of the instructions.  Patient advised to call back or seek an in-person evaluation if the symptoms or condition worsens.  Duration of encounter: 35 minutes.  Note by: [name], MD Date: 05/02/2019; Time: 2:48 PM

## 2019-05-02 ENCOUNTER — Ambulatory Visit: Payer: PPO | Attending: Pain Medicine | Admitting: Pain Medicine

## 2019-05-02 ENCOUNTER — Other Ambulatory Visit: Payer: Self-pay

## 2019-05-02 DIAGNOSIS — Z7901 Long term (current) use of anticoagulants: Secondary | ICD-10-CM

## 2019-05-02 DIAGNOSIS — G8929 Other chronic pain: Secondary | ICD-10-CM

## 2019-05-02 DIAGNOSIS — Z7902 Long term (current) use of antithrombotics/antiplatelets: Secondary | ICD-10-CM

## 2019-05-02 DIAGNOSIS — M545 Low back pain: Secondary | ICD-10-CM | POA: Diagnosis not present

## 2019-05-02 DIAGNOSIS — M47816 Spondylosis without myelopathy or radiculopathy, lumbar region: Secondary | ICD-10-CM | POA: Diagnosis not present

## 2019-05-02 NOTE — Patient Instructions (Addendum)
____________________________________________________________________________________________  Preparing for Procedure with Sedation  Procedure appointments are limited to planned procedures: . No Prescription Refills. . No disability issues will be discussed. . No medication changes will be discussed.  Instructions: . Oral Intake: Do not eat or drink anything for at least 8 hours prior to your procedure. . Transportation: Public transportation is not allowed. Bring an adult driver. The driver must be physically present in our waiting room before any procedure can be started. . Physical Assistance: Bring an adult physically capable of assisting you, in the event you need help. This adult should keep you company at home for at least 6 hours after the procedure. . Blood Pressure Medicine: Take your blood pressure medicine with a sip of water the morning of the procedure. . Blood thinners: Notify our staff if you are taking any blood thinners. Depending on which one you take, there will be specific instructions on how and when to stop it. . Diabetics on insulin: Notify the staff so that you can be scheduled 1st case in the morning. If your diabetes requires high dose insulin, take only  of your normal insulin dose the morning of the procedure and notify the staff that you have done so. . Preventing infections: Shower with an antibacterial soap the morning of your procedure. . Build-up your immune system: Take 1000 mg of Vitamin C with every meal (3 times a day) the day prior to your procedure. . Antibiotics: Inform the staff if you have a condition or reason that requires you to take antibiotics before dental procedures. . Pregnancy: If you are pregnant, call and cancel the procedure. . Sickness: If you have a cold, fever, or any active infections, call and cancel the procedure. . Arrival: You must be in the facility at least 30 minutes prior to your scheduled procedure. . Children: Do not bring  children with you. . Dress appropriately: Bring dark clothing that you would not mind if they get stained. . Valuables: Do not bring any jewelry or valuables.  Reasons to call and reschedule or cancel your procedure: (Following these recommendations will minimize the risk of a serious complication.) . Surgeries: Avoid having procedures within 2 weeks of any surgery. (Avoid for 2 weeks before or after any surgery). . Flu Shots: Avoid having procedures within 2 weeks of a flu shots or . (Avoid for 2 weeks before or after immunizations). . Barium: Avoid having a procedure within 7-10 days after having had a radiological study involving the use of radiological contrast. (Myelograms, Barium swallow or enema study). . Heart attacks: Avoid any elective procedures or surgeries for the initial 6 months after a "Myocardial Infarction" (Heart Attack). . Blood thinners: It is imperative that you stop these medications before procedures. Let us know if you if you take any blood thinner.  . Infection: Avoid procedures during or within two weeks of an infection (including chest colds or gastrointestinal problems). Symptoms associated with infections include: Localized redness, fever, chills, night sweats or profuse sweating, burning sensation when voiding, cough, congestion, stuffiness, runny nose, sore throat, diarrhea, nausea, vomiting, cold or Flu symptoms, recent or current infections. It is specially important if the infection is over the area that we intend to treat. . Heart and lung problems: Symptoms that may suggest an active cardiopulmonary problem include: cough, chest pain, breathing difficulties or shortness of breath, dizziness, ankle swelling, uncontrolled high or unusually low blood pressure, and/or palpitations. If you are experiencing any of these symptoms, cancel your procedure and contact   your primary care physician for an evaluation.  Remember:  Regular Business hours are:  Monday to Thursday  8:00 AM to 4:00 PM  Provider's Schedule: Milinda Pointer, MD:  Procedure days: Tuesday and Thursday 7:30 AM to 4:00 PM  Gillis Santa, MD:  Procedure days: Monday and Wednesday 7:30 AM to 4:00 PM ____________________________________________________________________________________________    ______________________________________________________________________________________________  Weight Management Required  URGENT: Your weight has been found to be adversely affecting your health.  Dear [name]:  Your current Estimated body mass index is 44.58 kg/m as calculated from the following:   Height as of 04/17/19: 5\' 11"  (1.803 m).   Weight as of 04/17/19: 319 lb 10.7 oz (145 kg).  Please use the table below to identify your weight category and associated incidence of chronic pain, secondary to your weight.  Body Mass Index (BMI) Classification BMI level (kg/m2) Category Associated incidence of chronic pain  <18  Underweight   18.5-24.9 Ideal body weight   25-29.9 Overweight  20%  30-34.9 Obese (Class I)  68%  35-39.9 Severe obesity (Class II)  136%  >40 Extreme obesity (Class III)  254%   In addition: You will be considered "Morbidly Obese", if your BMI is above 30 and you have one or more of the following conditions which are known to be directly associated with obesity: 1.    Type 2 Diabetes (Which in turn can lead to cardiovascular diseases (CVD), stroke, peripheral vascular diseases (PVD), retinopathy, nephropathy, and neuropathy) 2.    Cardiovascular Disease (High Blood Pressure; Congestive Heart Failure; High Cholesterol; Coronary Artery Disease; Angina; or History of Heart Attacks) 3.    Breathing problems (Asthma; obesity-hypoventilation syndrome; obstructive sleep apnea; chronic inflammatory airway disease; reactive airway disease; or shortness of breath) 4.    Chronic kidney disease 5.    Liver disease (nonalcoholic fatty liver disease) 6.    High blood pressure 7.     Acid reflux (gastroesophageal reflux disease; heartburn) 8.    Osteoarthritis (OA) (with any of the following: hip pain; knee pain; and/or low back pain) 9.    Low back pain (Lumbar Facet Syndrome; and/or Degenerative Disc Disease) 10.  Hip pain (Osteoarthritis of hip) (For every 1 lbs of added body weight, there is a 2 lbs increase in pressure inside of each hip articulation. 1:2 mechanical relationship) 11.  Knee pain (Osteoarthritis of knee) (For every 1 lbs of added body weight, there is a 4 lbs increase in pressure inside of each knee articulation. 1:4 mechanical relationship) (patients with a BMI>30 kg/m2 were 6.8 times more likely to develop knee OA than normal-weight individuals) 12.  Cancer. Epidemiological studies have shown that obesity is a risk factor for: post-menopausal breast cancer; cancers of the endometrium, colon and kidney cancer; malignant adenomas of the oesophagus. Obese subjects have an approximately 1.5-3.5-fold increased risk of developing these cancers compared with normal-weight subjects, and it has been estimated that between 15 and 45% of these cancers can be attributed to overweight. More recent studies suggest that obesity may also increase the risk of other types of cancer, including pancreatic, hepatic and gallbladder cancer. (Ref: Obesity and cancer. Pischon T, Nthlings U, [employer] H. Proc Nutr Soc. 2008 May;67(2):128-45. doi: N3840775.)  Recommendation: At this point it is urgent that you take a step back and concentrate in loosing weight. Dedicate 100% of your efforts on this task. Nothing else will improve your health more than bringing down your BMI to less than 30. Because most chronic pain patients do have  difficulty exercising secondary to their pain, you must rely on proper nutrition and dieting in order to lose the weight. If your BMI is above 40, you should seriously consider bariatric surgery. A realistic goal is to lose 10% of your body weight  over a period of 12 months.  If over time you have unsuccessfully try to lose weight, then it is time for you to seek professional help and to enter a medically supervised weight management program.  Pain management considerations:  1.    Pharmacological Problems: Be advised that the use of opioid analgesics (oxycodone; hydrocodone; morphine; methadone; codeine; and all of their derivatives) have been associated with decreased metabolism and weight gain.  For this reason, should we see that you are unable to lose weight while taking these medications, it may become necessary for [place] to taper down and indefinitely discontinue them.  2.    Technical Problems: The incidence of successful interventional therapies decreases as the patient's BMI increases. It is much more difficult to accomplish a safe and effective interventional therapy on a patient with a BMI above 35. 3.    Radiation Exposure Problems: The x-rays machine, used to accomplish injection therapies, will automatically increase their x-ray output in order to capture an appropriate bone image. This means that radiation exposure increases exponentially with the patient's BMI. (The higher the BMI, the higher the radiation exposure.) Although the level of radiation used at a given time is still safe to the patient, it is not for the physician and/or assisting staff. Unfortunately, radiation exposure is accumulative. Because physicians and the staff have to do procedures and be exposed on a daily basis, this can result in health problems such as cancer and radiation burns. Radiation exposure to the staff is monitored by the radiation batches that they wear. The exposure levels are reported back to the staff on a quarterly basis. Depending on levels of exposure, physicians and staff may be obligated by law to decrease this exposure. This means that they have the right and obligation to refuse providing therapies where they may be overexposed to radiation. For  this reason, physicians may decline to offer therapies such as radiofrequency ablation or implants to patients with a BMI above 40. 4.    Current Trends: Be advised that the current trend is to no longer offer certain therapies to patients with a BMI equal to, or above 35, due to increase perioperative risks, increased technical procedural difficulties, and excessive radiation exposure to healthcare personnel.  ______________________________________________________________________________________________    ____________________________________________________________________________________________  Blood Thinners  IMPORTANT NOTICE:  If you take any of these, make sure to notify the nursing staff.  Failure to do so may result in injury.  Recommended time intervals to stop and restart blood-thinners, before & after invasive procedures  Generic Name Brand Name Stop Time. Must be stopped at least this long before procedures. After procedures, wait at least this long before re-starting.  Abciximab Reopro 15 days 2 hrs  Alteplase Activase 10 days 10 days  Anagrelide Agrylin    Apixaban Eliquis 3 days 6 hrs  Cilostazol Pletal 3 days 5 hrs  Clopidogrel Plavix 7-10 days 2 hrs  Dabigatran Pradaxa 5 days 6 hrs  Dalteparin Fragmin 24 hours 4 hrs  Dipyridamole Aggrenox 11days 2 hrs  Edoxaban Lixiana; Savaysa 3 days 2 hrs  Enoxaparin  Lovenox 24 hours 4 hrs  Eptifibatide Integrillin 8 hours 2 hrs  Fondaparinux  Arixtra 72 hours 12 hrs  Prasugrel Effient 7-10 days 6 hrs  Reteplase Retavase 10 days 10 days  Rivaroxaban Xarelto 3 days 6 hrs  Ticagrelor Brilinta 5-7 days 6 hrs  Ticlopidine Ticlid 10-14 days 2 hrs  Tinzaparin Innohep 24 hours 4 hrs  Tirofiban Aggrastat 8 hours 2 hrs  Warfarin Coumadin 5 days 2 hrs   Other medications with blood-thinning effects  Product indications Generic (Brand) names Note  Cholesterol Lipitor Stop 4 days before procedure  Blood thinner (injectable) Heparin  (LMW or LMWH Heparin) Stop 24 hours before procedure  Cancer Ibrutinib (Imbruvica) Stop 7 days before procedure  Malaria/Rheumatoid Hydroxychloroquine (Plaquenil) Stop 11 days before procedure  Thrombolytics  10 days before or after procedures   Over-the-counter (OTC) Products with blood-thinning effects  Product Common names Stop Time  Aspirin > 325 mg Goody Powders, Excedrin, etc. 11 days  Aspirin ? 81 mg  7 days  Fish oil  4 days  Garlic supplements  7 days  Ginkgo biloba  36 hours  Ginseng  24 hours  NSAIDs Ibuprofen, Naprosyn, etc. 3 days  Vitamin E  4 days   ____________________________________________________________________________________________

## 2019-05-04 ENCOUNTER — Telehealth: Payer: Self-pay

## 2019-05-04 NOTE — Telephone Encounter (Signed)
Donald Fernandez called and said he was in terrible pain and could not walk. Wants someone to call him back. [phone]

## 2019-05-04 NOTE — Telephone Encounter (Signed)
Attempted to call patient back.  He states he is getting into chair at dialysis and could I call him back.

## 2019-05-04 NOTE — Telephone Encounter (Signed)
Patient called and is requesting pain medications.  Patient just had appointment with Dr Dossie Arbour on 05-02-2019.  A procedure was ordered.  Pain medications have never been ordered for this patient and I informed him that Dr Dossie Arbour would not prescribe pain medications over the phone.  Informed patinet that I would get a message to Dr Dossie Arbour but if he felt like he needed immediate attention then he should go the the ED.  Patient states understanding.

## 2019-05-07 DIAGNOSIS — E119 Type 2 diabetes mellitus without complications: Secondary | ICD-10-CM | POA: Diagnosis not present

## 2019-05-07 DIAGNOSIS — Z992 Dependence on renal dialysis: Secondary | ICD-10-CM | POA: Diagnosis not present

## 2019-05-11 ENCOUNTER — Telehealth: Payer: Self-pay | Admitting: *Deleted

## 2019-05-11 ENCOUNTER — Encounter: Payer: Self-pay | Admitting: Pain Medicine

## 2019-05-11 ENCOUNTER — Telehealth: Payer: Self-pay

## 2019-05-11 NOTE — Telephone Encounter (Signed)
Thanks, I called and reviewed his pain issues.

## 2019-05-11 NOTE — Telephone Encounter (Signed)
Called patient to review medications. He states he is having severe hip pain biilaterlly 6/10. He is unable to sit for long periods, of time or walk. He is very frustrated and would like this pain to quit. I told patient that he could always go to the ED and he stated that he has been a lot and that he couldn't afford it. Encouraged patient to stretch, heat, cold. He is currently out of medication and PCP will not prescribe since he is a patient at the pain clinic.  Donald Fernandez did state that he he was unable to get some relief that he may go to the pain clinic in [place].

## 2019-05-13 DIAGNOSIS — Z992 Dependence on renal dialysis: Secondary | ICD-10-CM | POA: Diagnosis not present

## 2019-05-13 DIAGNOSIS — N186 End stage renal disease: Secondary | ICD-10-CM | POA: Diagnosis not present

## 2019-05-13 NOTE — Progress Notes (Signed)
Patient: Donald Fernandez  Service Category: E/M  Provider: [name], MD  DOB: January 05, 1971  DOS: 05/14/2019  Location: Office  MRN: [id number]  Setting: Ambulatory outpatient  Referring Provider: Denton Lank, MD  Type: Established Patient  Specialty: Interventional Pain Management  PCP: Denton Lank, MD  Location: Remote location  Delivery: TeleHealth     Virtual Encounter - Pain Management PROVIDER NOTE: Information contained herein reflects review and annotations entered in association with encounter. Interpretation of such information and data should be left to medically-trained personnel. Information provided to patient can be located elsewhere in the medical record under "Patient Instructions". Document created using STT-dictation technology, any transcriptional errors that may result from process are unintentional.    Contact & Pharmacy Preferred: [phone] Home: [phone] (home) Mobile: [phone] (mobile) E-mail: [email or web address]  [place] ([address]), [place] [facility] [place] ([place]) [place] [address] Phone: [phone] Fax: [phone]   Pre-screening  Mr. Mcmeans offered "in-person" vs "virtual" encounter. He indicated preferring virtual for this encounter.   Reason COVID-19*  Social distancing based on CDC and AMA recommendations.   I contacted Donald Fernandez on 05/14/2019 via telephone.      I clearly identified myself as [name], MD. I verified that I was speaking with the correct person using two identifiers (Name: JAQUAIN APPLEGATE, and date of birth: Jul 09, 1970).  Consent I sought verbal advanced consent from Donald Fernandez for virtual visit interactions. I informed Mr. Dibartolo of possible security and privacy concerns, risks, and limitations associated with providing "not-in-person" medical evaluation and management services. I also informed Mr. Trierweiler of the availability of "in-person"  appointments. Finally, I informed him that there would be a charge for the virtual visit and that he could be  personally, fully or partially, financially responsible for it. Mr. Seeliger expressed understanding and agreed to proceed.   Historic Elements   Mr. MITESH MATTERN is a 49 y.o. year old, male patient evaluated today after his last encounter by our practice on 05/11/2019. Mr. Steffan  has a past medical history of Arthritis, Asthma, BPH (benign prostatic hyperplasia), Diabetes mellitus without complication ([place]), Dialysis patient [facility]), GERD (gastroesophageal reflux disease), Gout, Hyperlipidemia, Hypertension, Neuropathy, Renal disorder, Restless legs, and Sleep apnea. He also  has a past surgical history that includes Ankle surgery (Right); AV fistula placement (Left, 08/04/2016); IR ANGIO INTRA EXTRACRAN SEL INTERNAL CAROTID UNI R MOD SED (12/17/2016); Radiology with anesthesia (N/A, 12/17/2016); DIALYSIS/PERMA CATHETER INSERTION (N/A, 06/06/2017); A/V Fistulagram (N/A, 06/08/2017); AV Fistula Insertion w/RF Magnetic Guidance (Left, 09/02/2017); Upper Extremity Angiography (Left, 10/19/2017); Revison of arteriovenous fistula (Left, 01/04/2018); and DIALYSIS/PERMA CATHETER REMOVAL (N/A, 05/04/2018). Mr. Mcquistion has a current medication list which includes the following prescription(s): amlodipine, anastrozole, aspirin ec, atorvastatin, azelastine, calcium acetate, carvedilol, cholecalciferol, clomiphene, clonidine, clopidogrel, cyclobenzaprine, famotidine, fluticasone, furosemide, gabapentin, montelukast, sodium bicarbonate, tadalafil, tamsulosin, testosterone, topiramate, and vitamin e. He  reports that he has been smoking cigarettes. He has never used smokeless tobacco. He reports current alcohol use. He reports current drug use. Drug: Marijuana. Mr. Wheatley is allergic to accupril [quinapril hcl] and ace inhibitors.   HPI  Today, he is being contacted for follow-up evaluation. Patient sent by Dr. Sharlet Salina for SI  joint injections, currently anemic, on dialysis, and on Plavix anticoagulation.  Today I spoke to the patient and it was rather difficult to communicate clearly with him.  Apparently he was upset that he had called on Friday and he  wanted for [place] to call in some medicine for his pain right away.  However, he is a referral from Dr. Sharlet Salina and at this point where not taking his patients for medication management.  I have explained to the patient that we had done 1 diagnostic lumbar facet block which did provide him with excellent relief of his pain for quite some time.  On the follow-up from that procedure I spoke to him and I explained to him about the diagnostic injections and what the long-term plan would be meaning that we would plan on doing a second diagnostic injection and if it was as effective as the first 1, we could consider the possibility of radiofrequency ablation.  However, I also explained to the patient that he needed to do several things on his part to try to get things better including improving his diet and bringing his BMI down to 30 or less.  When I spoke to him today, I got the impression that he probably did not understand what our involvement is in his particular case.  He gave me the impression that he thought the injection that we had done was to permanently eliminate his low back pain as he was saying that he could not understand why he had come back.  I went ahead and again repeated to him that the lumbar facet syndrome that he had was a chronic condition that would probably need long-term care and that the first injection that we had done was a diagnostic one and would need to be followed by a second diagnostic injection before we could consider him as a possible candidate for radiofrequency ablation.  He went on to tell me that he had some problems with the receptionist that he spoke to on Friday, because he felt that she had been somewhat disrespectful to him.  However, having  spoken to him today I can see how it could have been a rather frustrating conversation since he has difficulty organizing his thoughts and explaining them in a way that can be easily understood.  Today I had to ask him to repeat things several times, given him for back, such as... repeating back what he was telling me and then asking him if that was what he wanted to communicate?  Interestingly, several times he would tell me that that is not what he wanted to say, even though I was essentially paraphrasing what he was telling me.  In any case to make him a very long story short, he is scheduled to return on February 9, for his second diagnostic lumbar facet block.  Today I make sure to remind him to be n.p.o. x8 hours, and to have a driver.  I also reminded him that we would not be prescribing his pain medications.  He had a lot of difficulty understanding that we keep a schedule and that we do not have a "walk-in clinic system".  Pharmacotherapy Assessment  Analgesic: No opioid analgesics prescribed by our practice. Oxycodone/APAP 5/325 1 tab PO 4 hours (Written by Jarvis Morgan, PA-C) Highest recorded MME/day: 50 mg/day MME/day: 50 mg/day   Monitoring: Pharmacotherapy: No side-effects or adverse reactions reported. [place] PMP: PDMP reviewed during this encounter.       Compliance: No problems identified. Effectiveness: Clinically acceptable. Plan: Refer to "POC".  UDS: No results found for: SUMMARY Laboratory Chemistry Profile (12 mo)  Renal: No results found for requested labs within last 8760 hours.  Lab Results  Component Value Date   GFRAA  5 (L) 12/14/2017   GFRNONAA 4 (L) 12/14/2017   Hepatic: No results found for requested labs within last 8760 hours. Lab Results  Component Value Date   AST 35 06/21/2017   ALT 18 06/21/2017   Other: No results found for requested labs within last 8760 hours.  Note: Above Lab results reviewed.  Imaging  Fluoro (C-Arm) (<60 min) (No  Report) Fluoro was used, but no Radiologist interpretation will be provided.  Please refer to "NOTES" tab for provider progress note.  Assessment  The primary encounter diagnosis was Sacroiliitis (Bilateral). Diagnoses of Lumbar facet syndrome (Bilateral) (L>R), Chronic low back pain (Primary Area of Pain) (Bilateral) (L>R) w/o sciatica, Chronic pain syndrome, ESRD on hemodialysis ([place]), and Chronic anticoagulation (Plavix) were also pertinent to this visit.  Plan of Care  Problem-specific:  No problem-specific Assessment & Plan notes found for this encounter.  I have discontinued Xachary A. Hatfield's predniSONE and oxyCODONE-acetaminophen. I am also having him maintain his tamsulosin, furosemide, amLODipine, atorvastatin, aspirin EC, gabapentin, cloNIDine, fluticasone, Cholecalciferol, Testosterone, topiramate, calcium acetate, carvedilol, clopidogrel, anastrozole, azelastine, clomiPHENE, famotidine, montelukast, sodium bicarbonate, tadalafil, vitamin E, and cyclobenzaprine.  Pharmacotherapy (Medications Ordered): No orders of the defined types were placed in this encounter.  Orders:  Orders Placed This Encounter  Procedures  . LUMBAR FACET(MEDIAL BRANCH NERVE BLOCK) MBNB    Standing Status:   Future    Standing Expiration Date:   06/11/2019    Scheduling Instructions:     Procedure: Lumbar facet block (AKA.: Lumbosacral medial branch nerve block)     Side: Bilateral     Level: L3-4, L4-5, & L5-S1 Facets (L2, L3, L4, L5, & S1 Medial Branch Nerves)     Sedation: Patient's choice.     Timeframe: ASAA    Order Specific Question:   Where will this procedure be performed?    Answer:   [facility]   Follow-up plan:   Return for Procedure (w/ sedation): (B) L-FCT BLK #2, (Blood-thinner Protocol).      Interventional treatment options: Planned, scheduled, and/or pending:   Diagnostic bilateral lumbar facet block #1 under fluoroscopic guidance and IV sedation (today)   Under  consideration NOTE: PLAVIX ANTICOAGULATION (Stop: 7-10 days  Re-start: 2 hrs) Diagnostic bilateral SI joint injection  Diagnostic bilateral lumbar facet block #2  Diagnostic bilateral intra-articular hip joint injections  Palliative bilateral knee joint injections    Therapeutic/palliative (PRN):   None at this time    Recent Visits Date Type Provider Dept  05/02/19 [place], [place], MD [facility]  04/17/19 Procedure visit Milinda Pointer, MD [facility]  03/26/19 Telemedicine Milinda Pointer, MD [facility]  Showing recent visits within past 90 days and meeting all other requirements   Today's Visits Date Type Provider Dept  05/14/19 Telemedicine Milinda Pointer, MD [facility]  Showing today's visits and meeting all other requirements   Future Appointments Date Type Provider Dept  05/22/19 Appointment Milinda Pointer, MD [facility]  Showing future appointments within next 90 days and meeting all other requirements   I discussed the assessment and treatment plan with the patient. The patient was provided an opportunity to ask questions and all were answered. The patient agreed with the plan and demonstrated an understanding of the instructions.  Patient advised to call back or seek an in-person evaluation if the symptoms or condition worsens.  Duration of encounter: 18 minutes.  Note by: [name], MD Date: 05/14/2019; Time: 1:53 PM

## 2019-05-14 ENCOUNTER — Other Ambulatory Visit: Payer: Self-pay

## 2019-05-14 ENCOUNTER — Ambulatory Visit: Payer: PPO | Attending: Pain Medicine | Admitting: Pain Medicine

## 2019-05-14 DIAGNOSIS — D631 Anemia in chronic kidney disease: Secondary | ICD-10-CM | POA: Diagnosis not present

## 2019-05-14 DIAGNOSIS — D509 Iron deficiency anemia, unspecified: Secondary | ICD-10-CM | POA: Diagnosis not present

## 2019-05-14 DIAGNOSIS — M545 Low back pain: Secondary | ICD-10-CM | POA: Diagnosis not present

## 2019-05-14 DIAGNOSIS — M47816 Spondylosis without myelopathy or radiculopathy, lumbar region: Secondary | ICD-10-CM

## 2019-05-14 DIAGNOSIS — G894 Chronic pain syndrome: Secondary | ICD-10-CM

## 2019-05-14 DIAGNOSIS — Z7902 Long term (current) use of antithrombotics/antiplatelets: Secondary | ICD-10-CM

## 2019-05-14 DIAGNOSIS — M461 Sacroiliitis, not elsewhere classified: Secondary | ICD-10-CM | POA: Diagnosis not present

## 2019-05-14 DIAGNOSIS — Z7901 Long term (current) use of anticoagulants: Secondary | ICD-10-CM

## 2019-05-14 DIAGNOSIS — Z992 Dependence on renal dialysis: Secondary | ICD-10-CM

## 2019-05-14 DIAGNOSIS — N2581 Secondary hyperparathyroidism of renal origin: Secondary | ICD-10-CM | POA: Diagnosis not present

## 2019-05-14 DIAGNOSIS — G8929 Other chronic pain: Secondary | ICD-10-CM

## 2019-05-14 DIAGNOSIS — Z23 Encounter for immunization: Secondary | ICD-10-CM | POA: Diagnosis not present

## 2019-05-14 DIAGNOSIS — N186 End stage renal disease: Secondary | ICD-10-CM

## 2019-05-14 NOTE — Patient Instructions (Signed)

## 2019-05-17 DIAGNOSIS — N186 End stage renal disease: Secondary | ICD-10-CM | POA: Diagnosis not present

## 2019-05-17 DIAGNOSIS — I10 Essential (primary) hypertension: Secondary | ICD-10-CM | POA: Diagnosis not present

## 2019-05-17 DIAGNOSIS — Z8739 Personal history of other diseases of the musculoskeletal system and connective tissue: Secondary | ICD-10-CM | POA: Diagnosis not present

## 2019-05-17 DIAGNOSIS — Z992 Dependence on renal dialysis: Secondary | ICD-10-CM | POA: Diagnosis not present

## 2019-05-22 ENCOUNTER — Encounter: Payer: Self-pay | Admitting: Pain Medicine

## 2019-05-22 ENCOUNTER — Ambulatory Visit (HOSPITAL_BASED_OUTPATIENT_CLINIC_OR_DEPARTMENT_OTHER): Payer: PPO | Admitting: Pain Medicine

## 2019-05-22 ENCOUNTER — Other Ambulatory Visit: Payer: Self-pay

## 2019-05-22 ENCOUNTER — Ambulatory Visit
Admission: RE | Admit: 2019-05-22 | Discharge: 2019-05-22 | Disposition: A | Discharge status: Home / Self Care | Payer: PPO | Source: Ambulatory Visit | Attending: Pain Medicine | Admitting: Pain Medicine

## 2019-05-22 VITALS — BP 150/93 | HR 111 | Temp 97.9°F | Resp 15 | Ht 71.0 in | Wt 327.8 lb

## 2019-05-22 DIAGNOSIS — Z7901 Long term (current) use of anticoagulants: Secondary | ICD-10-CM | POA: Insufficient documentation

## 2019-05-22 DIAGNOSIS — M47816 Spondylosis without myelopathy or radiculopathy, lumbar region: Secondary | ICD-10-CM

## 2019-05-22 DIAGNOSIS — M5137 Other intervertebral disc degeneration, lumbosacral region: Secondary | ICD-10-CM | POA: Insufficient documentation

## 2019-05-22 DIAGNOSIS — G8929 Other chronic pain: Secondary | ICD-10-CM | POA: Insufficient documentation

## 2019-05-22 DIAGNOSIS — M47817 Spondylosis without myelopathy or radiculopathy, lumbosacral region: Secondary | ICD-10-CM

## 2019-05-22 DIAGNOSIS — M545 Low back pain: Secondary | ICD-10-CM | POA: Diagnosis not present

## 2019-05-22 MED ORDER — ROPIVACAINE HCL 2 MG/ML IJ SOLN
18.0000 mL | Freq: Once | INTRAMUSCULAR | Status: AC
  Administered 2019-05-22: 18 mL via PERINEURAL
  Filled 2019-05-22: qty 20

## 2019-05-22 MED ORDER — FENTANYL CITRATE (PF) 100 MCG/2ML IJ SOLN
25.0000 ug | INTRAMUSCULAR | Status: DC | PRN
  Administered 2019-05-22: 100 ug via INTRAVENOUS
  Filled 2019-05-22: qty 2

## 2019-05-22 MED ORDER — LACTATED RINGERS IV SOLN
1000.0000 mL | Freq: Once | INTRAVENOUS | Status: AC
  Administered 2019-05-22: 1000 mL via INTRAVENOUS

## 2019-05-22 MED ORDER — TRIAMCINOLONE ACETONIDE 40 MG/ML IJ SUSP
80.0000 mg | Freq: Once | INTRAMUSCULAR | Status: AC
  Administered 2019-05-22: 80 mg
  Filled 2019-05-22: qty 2

## 2019-05-22 MED ORDER — LIDOCAINE HCL 2 % IJ SOLN
20.0000 mL | Freq: Once | INTRAMUSCULAR | Status: AC
  Administered 2019-05-22: 09:00:00 400 mg
  Filled 2019-05-22: qty 40

## 2019-05-22 MED ORDER — MIDAZOLAM HCL 5 MG/5ML IJ SOLN
1.0000 mg | INTRAMUSCULAR | Status: DC | PRN
  Administered 2019-05-22: 3 mg via INTRAVENOUS
  Filled 2019-05-22: qty 5

## 2019-05-22 NOTE — Progress Notes (Signed)
PROVIDER NOTE: Information contained herein reflects review and annotations entered in association with encounter. Interpretation of such information and data should be left to medically-trained personnel. Information provided to patient can be located elsewhere in the medical record under "Patient Instructions". Document created using STT-dictation technology, any transcriptional errors that may result from process are unintentional.    Patient: Donald Fernandez  Service Category: Procedure  Provider: [name], MD  DOB: 1970-08-21  DOS: 05/22/2019  Location: [facility]  MRN: [id number]  Setting: Ambulatory - outpatient  Referring Provider: Denton Lank, MD  Type: Established Patient  Specialty: Interventional Pain Management  PCP: Denton Lank, MD   Primary Reason for Visit: Interventional Pain Management Treatment. CC: Back Pain (lumbar bilateral ) and Shoulder Pain (right inner aspect close to spine)  Procedure:          Anesthesia, Analgesia, Anxiolysis:  Type: Lumbar Facet, Medial Branch Block(s) #2  Primary Purpose: Diagnostic Region: Posterolateral Lumbosacral Spine Level: L2, L3, L4, L5, & S1 Medial Branch Level(s). Injecting these levels blocks the L3-4, L4-5, and L5-S1 lumbar facet joints. Laterality: Bilateral  Type: Moderate (Conscious) Sedation combined with Local Anesthesia Indication(s): Analgesia and Anxiety Route: Intravenous (IV) IV Access: Secured Sedation: Meaningful verbal contact was maintained at all times during the procedure  Local Anesthetic: Lidocaine 1-2%  Position: Prone   Indications: 1. Lumbar facet syndrome (Bilateral) (L>R)   2. Spondylosis without myelopathy or radiculopathy, lumbosacral region   3. Lumbar facet hypertrophy (Multilevel) (Bilateral)   4. DDD (degenerative disc disease), lumbosacral   5. Chronic low back pain (Primary Area of Pain) (Bilateral) (L>R) w/o sciatica   6. Osteoarthritis of lumbar spine    Pain  Score: Pre-procedure: 6 /10 Post-procedure: 0-No pain/10   Pre-op Assessment:  Donald Fernandez is a 49 y.o. (year old), male patient, seen today for interventional treatment. He  has a past surgical history that includes Ankle surgery (Right); AV fistula placement (Left, 08/04/2016); IR ANGIO INTRA EXTRACRAN SEL INTERNAL CAROTID UNI R MOD SED (12/17/2016); Radiology with anesthesia (N/A, 12/17/2016); DIALYSIS/PERMA CATHETER INSERTION (N/A, 06/06/2017); A/V Fistulagram (N/A, 06/08/2017); AV Fistula Insertion w/RF Magnetic Guidance (Left, 09/02/2017); Upper Extremity Angiography (Left, 10/19/2017); Revison of arteriovenous fistula (Left, 01/04/2018); and DIALYSIS/PERMA CATHETER REMOVAL (N/A, 05/04/2018). Donald Fernandez has a current medication list which includes the following prescription(s): amlodipine, anastrozole, aspirin ec, atorvastatin, azelastine, calcium acetate, carvedilol, cholecalciferol, clomiphene, clonidine, clopidogrel, cyclobenzaprine, famotidine, fluticasone, furosemide, gabapentin, montelukast, oxycodone-acetaminophen, sodium bicarbonate, tadalafil, tamsulosin, testosterone, topiramate, and vitamin e, and the following Facility-Administered Medications: fentanyl and midazolam. His primarily concern today is the Back Pain (lumbar bilateral ) and Shoulder Pain (right inner aspect close to spine)  Initial Vital Signs:  Pulse/HCG Rate: (!) 111ECG Heart Rate: (!) 112 Temp: 97.9 F (36.6 C) Resp: 16 BP: (!) 139/95 SpO2: 100 %  BMI: Estimated body mass index is 45.72 kg/m as calculated from the following:   Height as of this encounter: 5\' 11"  (1.803 m).   Weight as of this encounter: 327 lb 12.8 oz (148.7 kg).  Risk Assessment: Allergies: Reviewed. He is allergic to accupril [quinapril hcl] and ace inhibitors.  Allergy Precautions: None required Coagulopathies: Reviewed. None identified.  Blood-thinner therapy: None at this time Active Infection(s): Reviewed. None identified. Donald Fernandez is  afebrile  Site Confirmation: Donald Fernandez was asked to confirm the procedure and laterality before marking the site Procedure checklist: Completed Consent: Before the procedure and under the influence of no sedative(s), amnesic(s), or anxiolytics, the patient was informed  of the treatment options, risks and possible complications. To fulfill our ethical and legal obligations, as recommended by the American Medical Association's Code of Ethics, I have informed the patient of my clinical impression; the nature and purpose of the treatment or procedure; the risks, benefits, and possible complications of the intervention; the alternatives, including doing nothing; the risk(s) and benefit(s) of the alternative treatment(s) or procedure(s); and the risk(s) and benefit(s) of doing nothing. The patient was provided information about the general risks and possible complications associated with the procedure. These may include, but are not limited to: failure to achieve desired goals, infection, bleeding, organ or nerve damage, allergic reactions, paralysis, and death. In addition, the patient was informed of those risks and complications associated to Spine-related procedures, such as failure to decrease pain; infection (i.e.: Meningitis, epidural or intraspinal abscess); bleeding (i.e.: epidural hematoma, subarachnoid hemorrhage, or any other type of intraspinal or peri-dural bleeding); organ or nerve damage (i.e.: Any type of peripheral nerve, nerve root, or spinal cord injury) with subsequent damage to sensory, motor, and/or autonomic systems, resulting in permanent pain, numbness, and/or weakness of one or several areas of the body; allergic reactions; (i.e.: anaphylactic reaction); and/or death. Furthermore, the patient was informed of those risks and complications associated with the medications. These include, but are not limited to: allergic reactions (i.e.: anaphylactic or anaphylactoid reaction(s)); adrenal  axis suppression; blood sugar elevation that in diabetics may result in ketoacidosis or comma; water retention that in patients with history of congestive heart failure may result in shortness of breath, pulmonary edema, and decompensation with resultant heart failure; weight gain; swelling or edema; medication-induced neural toxicity; particulate matter embolism and blood vessel occlusion with resultant organ, and/or nervous system infarction; and/or aseptic necrosis of one or more joints. Finally, the patient was informed that Medicine is not an exact science; therefore, there is also the possibility of unforeseen or unpredictable risks and/or possible complications that may result in a catastrophic outcome. The patient indicated having understood very clearly. We have given the patient no guarantees and we have made no promises. Enough time was given to the patient to ask questions, all of which were answered to the patient's satisfaction. Mr. Weese has indicated that he wanted to continue with the procedure. Attestation: I, the ordering provider, attest that I have discussed with the patient the benefits, risks, side-effects, alternatives, likelihood of achieving goals, and potential problems during recovery for the procedure that I have provided informed consent. Date  Time: 05/22/2019  8:57 AM  Pre-Procedure Preparation:  Monitoring: As per clinic protocol. Respiration, ETCO2, SpO2, BP, heart rate and rhythm monitor placed and checked for adequate function Safety Precautions: Patient was assessed for positional comfort and pressure points before starting the procedure. Time-out: I initiated and conducted the "Time-out" before starting the procedure, as per protocol. The patient was asked to participate by confirming the accuracy of the "Time Out" information. Verification of the correct person, site, and procedure were performed and confirmed by me, the nursing staff, and the patient. "Time-out"  conducted as per Joint Commission's Universal Protocol (UP.01.01.01). Time: 0940  Description of Procedure:          Laterality: Bilateral. The procedure was performed in identical fashion on both sides. Levels:  L2, L3, L4, L5, & S1 Medial Branch Level(s) Area Prepped: Posterior Lumbosacral Region Prepping solution: DuraPrep (Iodine Povacrylex [0.7% available iodine] and Isopropyl Alcohol, 74% w/w) Safety Precautions: Aspiration looking for blood return was conducted prior to all injections. At no  point did we inject any substances, as a needle was being advanced. Before injecting, the patient was told to immediately notify me if he was experiencing any new onset of "ringing in the ears, or metallic taste in the mouth". No attempts were made at seeking any paresthesias. Safe injection practices and needle disposal techniques used. Medications properly checked for expiration dates. SDV (single dose vial) medications used. After the completion of the procedure, all disposable equipment used was discarded in the proper designated medical waste containers. Local Anesthesia: Protocol guidelines were followed. The patient was positioned over the fluoroscopy table. The area was prepped in the usual manner. The time-out was completed. The target area was identified using fluoroscopy. A 12-in long, straight, sterile hemostat was used with fluoroscopic guidance to locate the targets for each level blocked. Once located, the skin was marked with an approved surgical skin marker. Once all sites were marked, the skin (epidermis, dermis, and hypodermis), as well as deeper tissues (fat, connective tissue and muscle) were infiltrated with a small amount of a short-acting local anesthetic, loaded on a 10cc syringe with a 25G, 1.5-in  Needle. An appropriate amount of time was allowed for local anesthetics to take effect before proceeding to the next step. Local Anesthetic: Lidocaine 2.0% The unused portion of the local  anesthetic was discarded in the proper designated containers. Technical explanation of process:  L2 Medial Branch Nerve Block (MBB): The target area for the L2 medial branch is at the junction of the postero-lateral aspect of the superior articular process and the superior, posterior, and medial edge of the transverse process of L3. Under fluoroscopic guidance, a Quincke needle was inserted until contact was made with os over the superior postero-lateral aspect of the pedicular shadow (target area). After negative aspiration for blood, 0.5 mL of the nerve block solution was injected without difficulty or complication. The needle was removed intact. L3 Medial Branch Nerve Block (MBB): The target area for the L3 medial branch is at the junction of the postero-lateral aspect of the superior articular process and the superior, posterior, and medial edge of the transverse process of L4. Under fluoroscopic guidance, a Quincke needle was inserted until contact was made with os over the superior postero-lateral aspect of the pedicular shadow (target area). After negative aspiration for blood, 0.5 mL of the nerve block solution was injected without difficulty or complication. The needle was removed intact. L4 Medial Branch Nerve Block (MBB): The target area for the L4 medial branch is at the junction of the postero-lateral aspect of the superior articular process and the superior, posterior, and medial edge of the transverse process of L5. Under fluoroscopic guidance, a Quincke needle was inserted until contact was made with os over the superior postero-lateral aspect of the pedicular shadow (target area). After negative aspiration for blood, 0.5 mL of the nerve block solution was injected without difficulty or complication. The needle was removed intact. L5 Medial Branch Nerve Block (MBB): The target area for the L5 medial branch is at the junction of the postero-lateral aspect of the superior articular process and the  superior, posterior, and medial edge of the sacral ala. Under fluoroscopic guidance, a Quincke needle was inserted until contact was made with os over the superior postero-lateral aspect of the pedicular shadow (target area). After negative aspiration for blood, 0.5 mL of the nerve block solution was injected without difficulty or complication. The needle was removed intact. S1 Medial Branch Nerve Block (MBB): The target area for the S1  medial branch is at the posterior and inferior 6 o'clock position of the L5-S1 facet joint. Under fluoroscopic guidance, the Quincke needle inserted for the L5 MBB was redirected until contact was made with os over the inferior and postero aspect of the sacrum, at the 6 o' clock position under the L5-S1 facet joint (Target area). After negative aspiration for blood, 0.5 mL of the nerve block solution was injected without difficulty or complication. The needle was removed intact.  Nerve block solution: 0.2% PF-Ropivacaine + Triamcinolone (40 mg/mL) diluted to a final concentration of 4 mg of Triamcinolone/mL of Ropivacaine The unused portion of the solution was discarded in the proper designated containers. Procedural Needles: 22-gauge, 3.5-inch, Quincke needles used for all levels.  Once the entire procedure was completed, the treated area was cleaned, making sure to leave some of the prepping solution back to take advantage of its long term bactericidal properties.   Illustration of the posterior view of the lumbar spine and the posterior neural structures. Laminae of L2 through S1 are labeled. DPRL5, dorsal primary ramus of L5; DPRS1, dorsal primary ramus of S1; DPR3, dorsal primary ramus of L3; FJ, facet (zygapophyseal) joint L3-L4; I, inferior articular process of L4; LB1, lateral branch of dorsal primary ramus of L1; IAB, inferior articular branches from L3 medial branch (supplies L4-L5 facet joint); IBP, intermediate branch plexus; MB3, medial branch of dorsal primary  ramus of L3; NR3, third lumbar nerve root; S, superior articular process of L5; SAB, superior articular branches from L4 (supplies L4-5 facet joint also); TP3, transverse process of L3.  Vitals:   05/22/19 0953 05/22/19 1000 05/22/19 1010 05/22/19 1020  BP: 112/70 139/89 130/75 (!) 150/93  Pulse:      Resp: 18 20 16 15   Temp:      TempSrc:      SpO2: 97% 99% 99% 100%  Weight:      Height:         Start Time: 0940 hrs. End Time: 0953 hrs.  Imaging Guidance (Spinal):          Type of Imaging Technique: Fluoroscopy Guidance (Spinal) Indication(s): Assistance in needle guidance and placement for procedures requiring needle placement in or near specific anatomical locations not easily accessible without such assistance. Exposure Time: Please see nurses notes. Contrast: None used. Fluoroscopic Guidance: I was personally present during the use of fluoroscopy. "Tunnel Vision Technique" used to obtain the best possible view of the target area. Parallax error corrected before commencing the procedure. "Direction-depth-direction" technique used to introduce the needle under continuous pulsed fluoroscopy. Once target was reached, antero-posterior, oblique, and lateral fluoroscopic projection used confirm needle placement in all planes. Images permanently stored in EMR. Interpretation: No contrast injected. I personally interpreted the imaging intraoperatively. Adequate needle placement confirmed in multiple planes. Permanent images saved into the patient's record.  Antibiotic Prophylaxis:   Anti-infectives (From admission, onward)   None     Indication(s): None identified  Post-operative Assessment:  Post-procedure Vital Signs:  Pulse/HCG Rate: (!) 111(!) 102 Temp: 97.9 F (36.6 C) Resp: 15 BP: (!) 150/93 SpO2: 100 %  EBL: None  Complications: No immediate post-treatment complications observed by team, or reported by patient.  Note: The patient tolerated the entire procedure well. A  repeat set of vitals were taken after the procedure and the patient was kept under observation following institutional policy, for this type of procedure. Post-procedural neurological assessment was performed, showing return to baseline, prior to discharge. The patient was provided with post-procedure discharge  instructions, including a section on how to identify potential problems. Should any problems arise concerning this procedure, the patient was given instructions to immediately contact us, at any time, without hesitation. In any case, we plan to contact the patient by telephone for a follow-up status report regarding this interventional procedure.  Comments:  No additional relevant information.  Plan of Care  Orders:  Orders Placed This Encounter  Procedures  . LUMBAR FACET(MEDIAL BRANCH NERVE BLOCK) MBNB    Scheduling Instructions:     Procedure: Lumbar facet block (AKA.: Lumbosacral medial branch nerve block)     Side: Bilateral     Level: L3-4, L4-5, & L5-S1 Facets (L2, L3, L4, L5, & S1 Medial Branch Nerves)     Sedation: Patient's choice.     Timeframe: Today    Order Specific Question:   Where will this procedure be performed?    Answer:   [facility]  . [facility] C-ARM 1-60 MIN NO REPORT    Intraoperative interpretation by procedural physician at [place].    Standing Status:   Standing    Number of Occurrences:   1    Order Specific Question:   Reason for exam:    Answer:   Assistance in needle guidance and placement for procedures requiring needle placement in or near specific anatomical locations not easily accessible without such assistance.  . Informed Consent Details: Physician/Practitioner Attestation; Transcribe to consent form and obtain patient signature    Nursing Order: Transcribe to consent form and obtain patient signature. Note: Always confirm laterality of pain with Mr. Screws, before procedure. Procedure: Lumbar Facet Block  under  fluoroscopic guidance Indication/Reason: Low Back Pain, with our without leg pain, due to Facet Joint Arthralgia (Joint Pain) known as Lumbar Facet Syndrome, secondary to Lumbar, and/or Lumbosacral Spondylosis (Arthritis of the Spine), without myelopathy or radiculopathy (Nerve Damage). Provider Attestation: I, [place] Dossie Arbour, MD, (Pain Management Specialist), the physician/practitioner, attest that I have discussed with the patient the benefits, risks, side effects, alternatives, likelihood of achieving goals and potential problems during recovery for the procedure that I have provided informed consent.  . Care order/instruction: Please confirm that the patient has stopped the Plavix (Clopidogrel) x 7-10 days prior to procedure or surgery.    Please confirm that the patient has stopped the Plavix (Clopidogrel) x 7-10 days prior to procedure or surgery.    Standing Status:   Standing    Number of Occurrences:   1  . Provide equipment / supplies at bedside    Equipment required: Single use, disposable, "Block Tray"    Standing Status:   Standing    Number of Occurrences:   1    Order Specific Question:   Specify    Answer:   Block Tray  . Bleeding precautions    Standing Status:   Standing    Number of Occurrences:   1   Chronic Opioid Analgesic:  No opioid analgesics prescribed by our practice. Oxycodone/APAP 5/325 1 tab PO 4 hours (Written by Jarvis Morgan, PA-C) Highest recorded MME/day: 50 mg/day MME/day: 50 mg/day   Medications ordered for procedure: Meds ordered this encounter  Medications  . lidocaine (XYLOCAINE) 2 % (with pres) injection 400 mg  . lactated ringers infusion 1,000 mL  . midazolam (VERSED) 5 MG/5ML injection 1-2 mg    Make sure Flumazenil is available in the pyxis when using this medication. If oversedation occurs, administer 0.2 mg IV over 15 sec. If after  45 sec no response, administer 0.2 mg again over 1 min; may repeat at 1 min intervals; not to exceed  4 doses (1 mg)  . fentaNYL (SUBLIMAZE) injection 25-50 mcg    Make sure Narcan is available in the pyxis when using this medication. In the event of respiratory depression (RR< 8/min): Titrate NARCAN (naloxone) in increments of 0.1 to 0.2 mg IV at 2-3 minute intervals, until desired degree of reversal.  . ropivacaine (PF) 2 mg/mL (0.2%) (NAROPIN) injection 18 mL  . triamcinolone acetonide (KENALOG-40) injection 80 mg   Medications administered: We administered lidocaine, lactated ringers, midazolam, fentaNYL, ropivacaine (PF) 2 mg/mL (0.2%), and triamcinolone acetonide.  See the medical record for exact dosing, route, and time of administration.  Follow-up plan:   Return in about 2 weeks (around 06/05/2019) for (VV), (PP).       Interventional treatment options: Planned, scheduled, and/or pending:      Under consideration NOTE: PLAVIX ANTICOAGULATION (Stop: 7-10 days  Re-start: 2 hrs) Diagnostic bilateral SI joint injection  Diagnostic bilateral lumbar facet block #3  Diagnostic bilateral intra-articular hip joint injections  Palliative bilateral knee joint injections    Therapeutic/palliative (PRN):   None at this time    Recent Visits Date Type Provider Dept  05/14/19 Telemedicine Milinda Pointer, [facility]  05/02/19 Telemedicine Milinda Pointer, MD [facility]  04/17/19 Procedure visit Milinda Pointer, MD [facility]  03/26/19 Telemedicine Milinda Pointer, MD [facility]  Showing recent visits within past 90 days and meeting all other requirements   Today's Visits Date Type Provider Dept  05/22/19 Procedure visit Milinda Pointer, MD [facility]  Showing today's visits and meeting all other requirements   Future Appointments Date Type Provider Dept  06/06/19 Appointment Milinda Pointer, MD [facility]  Showing future appointments within next 90 days and meeting all other requirements    Disposition: Discharge home  Discharge (Date  Time): 05/22/2019; 1020 hrs.   Primary Care Physician: Denton Lank, MD Location: [facility] Note by: [name], MD Date: 05/22/2019; Time: 10:59 AM  Disclaimer:  Medicine is not an Chief Strategy Officer. The only guarantee in medicine is that nothing is guaranteed. It is important to note that the decision to proceed with this intervention was based on the information collected from the patient. The Data and conclusions were drawn from the patient's questionnaire, the interview, and the physical examination. Because the information was provided in large part by the patient, it cannot be guaranteed that it has not been purposely or unconsciously manipulated. Every effort has been made to obtain as much relevant data as possible for this evaluation. It is important to note that the conclusions that lead to this procedure are derived in large part from the available data. Always take into account that the treatment will also be dependent on availability of resources and existing treatment guidelines, considered by other Pain Management Practitioners as being common knowledge and practice, at the time of the intervention. For Medico-Legal purposes, it is also important to point out that variation in procedural techniques and pharmacological choices are the acceptable norm. The indications, contraindications, technique, and results of the above procedure should only be interpreted and judged by a Board-Certified Interventional Pain Specialist with extensive familiarity and expertise in the same exact procedure and technique.

## 2019-05-22 NOTE — Patient Instructions (Signed)

## 2019-05-23 ENCOUNTER — Telehealth: Payer: Self-pay | Admitting: *Deleted

## 2019-05-23 NOTE — Telephone Encounter (Signed)
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

## 2019-05-26 DIAGNOSIS — E119 Type 2 diabetes mellitus without complications: Secondary | ICD-10-CM | POA: Diagnosis not present

## 2019-05-26 DIAGNOSIS — M461 Sacroiliitis, not elsewhere classified: Secondary | ICD-10-CM | POA: Diagnosis not present

## 2019-06-01 NOTE — Progress Notes (Signed)
Pain relief after procedure (treated area only): (Questions asked to patient) 1. Starting about 15 minutes after the procedure, and "while the area was still numb" (from the local anesthetics), were you having any of your usual pain "in that area" (the treated area)?  (NOTE: NOT including the discomfort from the needle sticks.) First 1 hour: 100 % better. First 4-6 hours: 100 % better. 2. How long did the numbness from the local anesthetics last? (More than 6 hours?) Duration: 4 hours.  3. How much better is your pain now, when compared to before the procedure? Current benefit: 75 % better. 4. Can you move better now? Improvement in ROM (Range of Motion): Yes. 5. Can you do more now? Improvement in function: Yes. 4. Did you have any problems with the procedure? Side-effects/Complications: No.Pain relief after procedure (treated area only): (Questions asked to patient) 1. Starting about 15 minutes after the procedure, and "while the area was still numb" (from the local anesthetics), were you having any of your usual pain "in that area" (the treated area)?  (NOTE: NOT including the discomfort from the needle sticks.) First 1 hour: 100 % better. First 4-6 hours: 100 % better. 2. How long did the numbness from the local anesthetics last? (More than 6 hours?) Duration: 100 hours.  3. How much better is your pain now, when compared to before the procedure? Current benefit: 100 % better. 4. Can you move better now? Improvement in ROM (Range of Motion): Yes. 5. Can you do more now? Improvement in function: Yes. 4. Did you have any problems with the procedure? Side-effects/Complications: No.

## 2019-06-04 DIAGNOSIS — Z992 Dependence on renal dialysis: Secondary | ICD-10-CM | POA: Diagnosis not present

## 2019-06-05 NOTE — Progress Notes (Signed)
Patient: Donald Fernandez  Service Category: E/M  Provider: [name], MD  DOB: February 01, 1971  DOS: 06/06/2019  Location: Office  MRN: [id number]  Setting: Ambulatory outpatient  Referring Provider: Denton Lank, MD  Type: Established Patient  Specialty: Interventional Pain Management  PCP: Denton Lank, MD  Location: Remote location  Delivery: TeleHealth     Virtual Encounter - Pain Management PROVIDER NOTE: Information contained herein reflects review and annotations entered in association with encounter. Interpretation of such information and data should be left to medically-trained personnel. Information provided to patient can be located elsewhere in the medical record under "Patient Instructions". Document created using STT-dictation technology, any transcriptional errors that may result from process are unintentional.    Contact & Pharmacy Preferred: [phone] Home: [phone] (home) Mobile: [phone] (mobile) E-mail: [email or web address]  [facility] [address] ([address]), [place] - [place]) [place] [address] Phone: [phone] Fax: [phone]   Pre-screening  Donald Fernandez offered "in-person" vs "virtual" encounter. He indicated preferring virtual for this encounter.   Reason COVID-19*  Social distancing based on CDC and AMA recommendations.   I contacted Donald Fernandez on 06/06/2019 via telephone.      I clearly identified myself as 06/19/2019, MD. I verified that I was speaking with the correct person using two identifiers (Name: Donald Fernandez, and date of birth: 1970-07-29).  Consent I sought verbal advanced consent from 21/09/1970 for virtual visit interactions. I informed Mr. Vallery of possible security and privacy concerns, risks, and limitations associated with providing "not-in-person" medical evaluation and management services. I also informed Mr. Hippe of the availability of "in-person"  appointments. Finally, I informed him that there would be a charge for the virtual visit and that he could be  personally, fully or partially, financially responsible for it. Mr. Giangregorio expressed understanding and agreed to proceed.   Historic Elements   Donald Fernandez is a 49 y.o. year old, male patient evaluated today after his last contact with our practice on 05/23/2019. Donald Fernandez  has a past medical history of Arthritis, Asthma, BPH (benign prostatic hyperplasia), Diabetes mellitus without complication ([place]), Dialysis patient [facility]), GERD (gastroesophageal reflux disease), Gout, Hyperlipidemia, Hypertension, Neuropathy, Renal disorder, Restless legs, and Sleep apnea. He also  has a past surgical history that includes Ankle surgery (Right); AV fistula placement (Left, 08/04/2016); IR ANGIO INTRA EXTRACRAN SEL INTERNAL CAROTID UNI R MOD SED (12/17/2016); Radiology with anesthesia (N/A, 12/17/2016); DIALYSIS/PERMA CATHETER INSERTION (N/A, 06/06/2017); A/V Fistulagram (N/A, 06/08/2017); AV Fistula Insertion w/RF Magnetic Guidance (Left, 09/02/2017); Upper Extremity Angiography (Left, 10/19/2017); Revison of arteriovenous fistula (Left, 01/04/2018); and DIALYSIS/PERMA CATHETER REMOVAL (N/A, 05/04/2018). Donald Fernandez has a current medication list which includes the following prescription(s): amlodipine, anastrozole, aspirin ec, atorvastatin, azelastine, calcium acetate, carvedilol, cholecalciferol, clomiphene, clonidine, clopidogrel, cyclobenzaprine, famotidine, fluticasone, furosemide, gabapentin, montelukast, oxycodone-acetaminophen, rybelsus, sodium bicarbonate, tadalafil, tamsulosin, testosterone, topiramate, and vitamin e. He  reports that he has been smoking cigarettes. He has never used smokeless tobacco. He reports current alcohol use. He reports current drug use. Drug: Marijuana. Donald Fernandez is allergic to accupril [quinapril hcl] and ace inhibitors.   HPI  Today, he is being contacted for a post-procedure  assessment.  Right now the patient indicates having absolutely no low back pain.  The diagnostic bilateral lumbar facet block #2 provided him with 100% relief of the pain that continues to go on.  Today I took the opportunity to remind the patient that  based on the results of that diagnostic injection, what we now know is that he has a bilateral lumbar facet syndrome.  I also explained to him that this is a type of arthritis and it is very likely that some of the pain will come back.  To minimize the chances, I have encouraged him to begin losing weight and to try to bring his BMI to 30 or less.  Currently he has a weight of 327 pounds, which will continue to aggravate and further deteriorate the lumbar facet joints if he does not work on losing some of that.  In addition, I have recommended to the patient to be careful with lifting and twisting at the level of the waist, since that is the type of activity that tends to aggravate the facet joints.  He understood and accepted.  Going forward, the plan is to add a PRN order to repeat the lumbar facet block, should the pain return.  Long-term, what I would like for him to do is to bring his BMI down to the point where I can then do a radiofrequency ablation to then provide him with longer lasting benefit.  Post-Procedure Evaluation  Procedure (05/22/2019): Diagnostic bilateral lumbar facet block #2 under fluoroscopic guidance and IV sedation Pre-procedure pain level:  6/10 Post-procedure: 0/10 (100% relief)  Sedation: Sedation provided.   Janett Billow, RN  06/06/2019  8:13 AM  Sign when Signing Visit Pain relief after procedure (treated area only): (Questions asked to patient) 1. Starting about 15 minutes after the procedure, and "while the area was still numb" (from the local anesthetics), were you having any of your usual pain "in that area" (the treated area)?  (NOTE: NOT including the discomfort from the needle sticks.) First 1 hour: 100 %  better. First 4-6 hours: 100 % better. 2. How long did the numbness from the local anesthetics last? (More than 6 hours?) Duration: 100 hours.  3. How much better is your pain now, when compared to before the procedure? Current benefit: 100 % better. 4. Can you move better now? Improvement in ROM (Range of Motion): Yes. 5. Can you do more now? Improvement in function: Yes. 4. Did you have any problems with the procedure? Side-effects/Complications: No.  Current benefits: Defined as benefit that persist at this time.   Analgesia:  90-100% better Function: Donald Fernandez reports improvement in function ROM: Donald Fernandez reports improvement in ROM  Pharmacotherapy Assessment  Analgesic: No opioid analgesics prescribed by our practice. Oxycodone/APAP 5/325 1 tab PO 4 hours (Written by Jarvis Morgan, PA-C) Highest recorded MME/day: 50 mg/day MME/day: 50 mg/day   Monitoring: [place] PMP: PDMP reviewed during this encounter.       Pharmacotherapy: No side-effects or adverse reactions reported. Compliance: No problems identified. Effectiveness: Clinically acceptable. Plan: Refer to "POC".  UDS: No results found for: SUMMARY Laboratory Chemistry Profile   Renal Lab Results  Component Value Date   BUN 52 (H) 12/14/2017   CREATININE 12.01 (H) 12/14/2017   GFRAA 5 (L) 12/14/2017   GFRNONAA 4 (L) 12/14/2017    Hepatic Lab Results  Component Value Date   AST 35 06/21/2017   ALT 18 06/21/2017   ALBUMIN 3.5 06/21/2017   ALKPHOS 80 06/21/2017   HCVAB <0.1 06/08/2017    Electrolytes Lab Results  Component Value Date   NA 140 01/04/2018   K 3.5 01/04/2018   CL 100 12/14/2017   CALCIUM 8.4 (L) 12/14/2017   PHOS 5.3 (H) 06/07/2017  Bone No results found.  Inflammation (CRP: Acute Phase) (ESR: Chronic Phase) Lab Results  Component Value Date   LATICACIDVEN 0.7 07/09/2016      Note: Above Lab results reviewed.  Imaging  DG PAIN CLINIC C-ARM 1-60 MIN NO REPORT Fluoro was  used, but no Radiologist interpretation will be provided.  Please refer to "NOTES" tab for provider progress note.  Assessment  The primary encounter diagnosis was Chronic pain syndrome. Diagnoses of Chronic low back pain (Primary Area of Pain) (Bilateral) (L>R) w/o sciatica and Lumbar facet syndrome (Bilateral) (L>R) were also pertinent to this visit.  Plan of Care  Problem-specific:  No problem-specific Assessment & Plan notes found for this encounter.  Donald Fernandez has a current medication list which includes the following long-term medication(s): amlodipine, atorvastatin, azelastine, carvedilol, clonidine, fluticasone, furosemide, gabapentin, montelukast, and tadalafil.  Pharmacotherapy (Medications Ordered): No orders of the defined types were placed in this encounter.  Orders:  Orders Placed This Encounter  Procedures  . LUMBAR FACET(MEDIAL BRANCH NERVE BLOCK) MBNB    Scheduling timeframe: (PRN procedure) Donald Fernandez will call when needed. Clinical indication: Axial low back pain. Lumbosacral Spondylosis (M47.897).  Sedation: Usually done with sedation. (May be done without sedation if so desired by patient.) Requirements: NPO x 8 hrs.; Driver; Stop blood thinners. Interval: No sooner than two weeks for diagnostic or therapeutic. No sooner than every other month for palliative.    Standing Status:   Standing    Number of Occurrences:   5    Standing Expiration Date:   06/05/2020    Scheduling Instructions:     Procedure: Lumbar facet block (AKA.: Lumbosacral medial branch nerve block)     Level: L3-4, L4-5, & L5-S1 Facets (L2, L3, L4, L5, & S1 Medial Branch Nerves)     Laterality: Bilateral    Order Specific Question:   Where will this procedure be performed?    Answer:   [facility]   Follow-up plan:   Return if symptoms worsen or fail to improve, for PRN Procedure(s): (B) L-FCT Blk #3, (w/ Sedation), (Blood-thinner Protocol).      Interventional treatment  options: Planned, scheduled, and/or pending:      Under consideration NOTE: PLAVIX ANTICOAGULATION (Stop: 7-10 days  Re-start: 2 hrs) Diagnostic bilateral SI joint injection  Diagnostic bilateral IA hip joint injections  Palliative bilateral knee joint injections  Note: Eventually when the patient brings his BMI below 35, we may be able to offer him radiofrequency ablation of the lumbar facets.  (No RFA until BMI<35)   Therapeutic/palliative (PRN):   Diagnostic/palliative bilateral lumbar facet block #3     Recent Visits Date Type Provider Dept  05/22/19 Procedure visit Milinda Pointer, MD [facility]  05/14/19 Telemedicine Milinda Pointer, MD [facility]  05/02/19 Telemedicine Milinda Pointer, MD [facility]  04/17/19 Procedure visit Milinda Pointer, MD [facility]  03/26/19 Telemedicine Milinda Pointer, MD [facility]  Showing recent visits within past 90 days and meeting all other requirements   Today's Visits Date Type Provider Dept  06/06/19 Telemedicine Milinda Pointer, MD [facility]  Showing today's visits and meeting all other requirements   Future Appointments No visits were found meeting these conditions.  Showing future appointments within next 90 days and meeting all other requirements   I discussed the assessment and treatment plan with the patient. The patient was provided an opportunity to ask questions and all were answered. The patient agreed with the  plan and demonstrated an understanding of the instructions.  Patient advised to call back or seek an in-person evaluation if the symptoms or condition worsens.  Duration of encounter: 15 minutes.  Note by: [name], MD Date: 06/06/2019; Time: 11:10 AM

## 2019-06-06 ENCOUNTER — Other Ambulatory Visit: Payer: Self-pay

## 2019-06-06 ENCOUNTER — Ambulatory Visit: Payer: PPO | Attending: Pain Medicine | Admitting: Pain Medicine

## 2019-06-06 DIAGNOSIS — M545 Low back pain: Secondary | ICD-10-CM | POA: Diagnosis not present

## 2019-06-06 DIAGNOSIS — M47816 Spondylosis without myelopathy or radiculopathy, lumbar region: Secondary | ICD-10-CM

## 2019-06-06 DIAGNOSIS — G894 Chronic pain syndrome: Secondary | ICD-10-CM | POA: Diagnosis not present

## 2019-06-06 DIAGNOSIS — G8929 Other chronic pain: Secondary | ICD-10-CM

## 2019-06-06 NOTE — Patient Instructions (Signed)
____________________________________________________________________________________________  Preparing for Procedure with Sedation  Procedure appointments are limited to planned procedures: . No Prescription Refills. . No disability issues will be discussed. . No medication changes will be discussed.  Instructions: . Oral Intake: Do not eat or drink anything for at least 8 hours prior to your procedure. . Transportation: Public transportation is not allowed. Bring an adult driver. The driver must be physically present in our waiting room before any procedure can be started. . Physical Assistance: Bring an adult physically capable of assisting you, in the event you need help. This adult should keep you company at home for at least 6 hours after the procedure. . Blood Pressure Medicine: Take your blood pressure medicine with a sip of water the morning of the procedure. . Blood thinners: Notify our staff if you are taking any blood thinners. Depending on which one you take, there will be specific instructions on how and when to stop it. . Diabetics on insulin: Notify the staff so that you can be scheduled 1st case in the morning. If your diabetes requires high dose insulin, take only  of your normal insulin dose the morning of the procedure and notify the staff that you have done so. . Preventing infections: Shower with an antibacterial soap the morning of your procedure. . Build-up your immune system: Take 1000 mg of Vitamin C with every meal (3 times a day) the day prior to your procedure. . Antibiotics: Inform the staff if you have a condition or reason that requires you to take antibiotics before dental procedures. . Pregnancy: If you are pregnant, call and cancel the procedure. . Sickness: If you have a cold, fever, or any active infections, call and cancel the procedure. . Arrival: You must be in the facility at least 30 minutes prior to your scheduled procedure. . Children: Do not bring  children with you. . Dress appropriately: Bring dark clothing that you would not mind if they get stained. . Valuables: Do not bring any jewelry or valuables.  Reasons to call and reschedule or cancel your procedure: (Following these recommendations will minimize the risk of a serious complication.) . Surgeries: Avoid having procedures within 2 weeks of any surgery. (Avoid for 2 weeks before or after any surgery). . Flu Shots: Avoid having procedures within 2 weeks of a flu shots or . (Avoid for 2 weeks before or after immunizations). . Barium: Avoid having a procedure within 7-10 days after having had a radiological study involving the use of radiological contrast. (Myelograms, Barium swallow or enema study). . Heart attacks: Avoid any elective procedures or surgeries for the initial 6 months after a "Myocardial Infarction" (Heart Attack). . Blood thinners: It is imperative that you stop these medications before procedures. Let us know if you if you take any blood thinner.  . Infection: Avoid procedures during or within two weeks of an infection (including chest colds or gastrointestinal problems). Symptoms associated with infections include: Localized redness, fever, chills, night sweats or profuse sweating, burning sensation when voiding, cough, congestion, stuffiness, runny nose, sore throat, diarrhea, nausea, vomiting, cold or Flu symptoms, recent or current infections. It is specially important if the infection is over the area that we intend to treat. . Heart and lung problems: Symptoms that may suggest an active cardiopulmonary problem include: cough, chest pain, breathing difficulties or shortness of breath, dizziness, ankle swelling, uncontrolled high or unusually low blood pressure, and/or palpitations. If you are experiencing any of these symptoms, cancel your procedure and contact   your primary care physician for an evaluation.  Remember:  Regular Business hours are:  Monday to Thursday  8:00 AM to 4:00 PM  Provider's Schedule: Canesha Tesfaye, MD:  Procedure days: Tuesday and Thursday 7:30 AM to 4:00 PM  Bilal Lateef, MD:  Procedure days: Monday and Wednesday 7:30 AM to 4:00 PM ____________________________________________________________________________________________   ____________________________________________________________________________________________  General Risks and Possible Complications  Patient Responsibilities: It is important that you read this as it is part of your informed consent. It is our duty to inform you of the risks and possible complications associated with treatments offered to you. It is your responsibility as a patient to read this and to ask questions about anything that is not clear or that you believe was not covered in this document.  Patient's Rights: You have the right to refuse treatment. You also have the right to change your mind, even after initially having agreed to have the treatment done. However, under this last option, if you wait until the last second to change your mind, you may be charged for the materials used up to that point.  Introduction: Medicine is not an exact science. Everything in Medicine, including the lack of treatment(s), carries the potential for danger, harm, or loss (which is by definition: Risk). In Medicine, a complication is a secondary problem, condition, or disease that can aggravate an already existing one. All treatments carry the risk of possible complications. The fact that a side effects or complications occurs, does not imply that the treatment was conducted incorrectly. It must be clearly understood that these can happen even when everything is done following the highest safety standards.  No treatment: You can choose not to proceed with the proposed treatment alternative. The "PRO(s)" would include: avoiding the risk of complications associated with the therapy. The "CON(s)" would include: not  getting any of the treatment benefits. These benefits fall under one of three categories: diagnostic; therapeutic; and/or palliative. Diagnostic benefits include: getting information which can ultimately lead to improvement of the disease or symptom(s). Therapeutic benefits are those associated with the successful treatment of the disease. Finally, palliative benefits are those related to the decrease of the primary symptoms, without necessarily curing the condition (example: decreasing the pain from a flare-up of a chronic condition, such as incurable terminal cancer).  General Risks and Complications: These are associated to most interventional treatments. They can occur alone, or in combination. They fall under one of the following six (6) categories: no benefit or worsening of symptoms; bleeding; infection; nerve damage; allergic reactions; and/or death. 1. No benefits or worsening of symptoms: In Medicine there are no guarantees, only probabilities. No healthcare provider can ever guarantee that a medical treatment will work, they can only state the probability that it may. Furthermore, there is always the possibility that the condition may worsen, either directly, or indirectly, as a consequence of the treatment. 2. Bleeding: This is more common if the patient is taking a blood thinner, either prescription or over the counter (example: Goody Powders, Fish oil, Aspirin, Garlic, etc.), or if suffering a condition associated with impaired coagulation (example: Hemophilia, cirrhosis of the liver, low platelet counts, etc.). However, even if you do not have one on these, it can still happen. If you have any of these conditions, or take one of these drugs, make sure to notify your treating physician. 3. Infection: This is more common in patients with a compromised immune system, either due to disease (example: diabetes, cancer, human immunodeficiency virus [HIV],   etc.), or due to medications or treatments  (example: therapies used to treat cancer and rheumatological diseases). However, even if you do not have one on these, it can still happen. If you have any of these conditions, or take one of these drugs, make sure to notify your treating physician. 4. Nerve Damage: This is more common when the treatment is an invasive one, but it can also happen with the use of medications, such as those used in the treatment of cancer. The damage can occur to small secondary nerves, or to large primary ones, such as those in the spinal cord and brain. This damage may be temporary or permanent and it may lead to impairments that can range from temporary numbness to permanent paralysis and/or brain death. 5. Allergic Reactions: Any time a substance or material comes in contact with our body, there is the possibility of an allergic reaction. These can range from a mild skin rash (contact dermatitis) to a severe systemic reaction (anaphylactic reaction), which can result in death. 6. Death: In general, any medical intervention can result in death, most of the time due to an unforeseen complication. ____________________________________________________________________________________________  ____________________________________________________________________________________________  Blood Thinners  IMPORTANT NOTICE:  If you take any of these, make sure to notify the nursing staff.  Failure to do so may result in injury.  Recommended time intervals to stop and restart blood-thinners, before & after invasive procedures  Generic Name Brand Name Stop Time. Must be stopped at least this long before procedures. After procedures, wait at least this long before re-starting.  Abciximab Reopro 15 days 2 hrs  Alteplase Activase 10 days 10 days  Anagrelide Agrylin    Apixaban Eliquis 3 days 6 hrs  Cilostazol Pletal 3 days 5 hrs  Clopidogrel Plavix 7-10 days 2 hrs  Dabigatran Pradaxa 5 days 6 hrs  Dalteparin Fragmin 24 hours  4 hrs  Dipyridamole Aggrenox 11days 2 hrs  Edoxaban Lixiana; Savaysa 3 days 2 hrs  Enoxaparin  Lovenox 24 hours 4 hrs  Eptifibatide Integrillin 8 hours 2 hrs  Fondaparinux  Arixtra 72 hours 12 hrs  Prasugrel Effient 7-10 days 6 hrs  Reteplase Retavase 10 days 10 days  Rivaroxaban Xarelto 3 days 6 hrs  Ticagrelor Brilinta 5-7 days 6 hrs  Ticlopidine Ticlid 10-14 days 2 hrs  Tinzaparin Innohep 24 hours 4 hrs  Tirofiban Aggrastat 8 hours 2 hrs  Warfarin Coumadin 5 days 2 hrs   Other medications with blood-thinning effects  Product indications Generic (Brand) names Note  Cholesterol Lipitor Stop 4 days before procedure  Blood thinner (injectable) Heparin (LMW or LMWH Heparin) Stop 24 hours before procedure  Cancer Ibrutinib (Imbruvica) Stop 7 days before procedure  Malaria/Rheumatoid Hydroxychloroquine (Plaquenil) Stop 11 days before procedure  Thrombolytics  10 days before or after procedures   Over-the-counter (OTC) Products with blood-thinning effects  Product Common names Stop Time  Aspirin > 325 mg Goody Powders, Excedrin, etc. 11 days  Aspirin ? 81 mg  7 days  Fish oil  4 days  Garlic supplements  7 days  Ginkgo biloba  36 hours  Ginseng  24 hours  NSAIDs Ibuprofen, Naprosyn, etc. 3 days  Vitamin E  4 days   ____________________________________________________________________________________________

## 2019-06-06 NOTE — Progress Notes (Signed)
Pain relief after procedure (treated area only): (Questions asked to patient) 1. Starting about 15 minutes after the procedure, and "while the area was still numb" (from the local anesthetics), were you having any of your usual pain "in that area" (the treated area)?  (NOTE: NOT including the discomfort from the needle sticks.) First 1 hour: 100 % better. First 4-6 hours: 100 % better. 2. How long did the numbness from the local anesthetics last? (More than 6 hours?) Duration: 6 hours.  3. How much better is your pain now, when compared to before the procedure? Current benefit: 75 % better. 4. Can you move better now? Improvement in ROM (Range of Motion): Yes. 5. Can you do more now? Improvement in function: Yes. 4. Did you have any problems with the procedure? Side-effects/Complications: No.

## 2019-06-10 DIAGNOSIS — Z992 Dependence on renal dialysis: Secondary | ICD-10-CM | POA: Diagnosis not present

## 2019-06-10 DIAGNOSIS — N186 End stage renal disease: Secondary | ICD-10-CM | POA: Diagnosis not present

## 2019-06-11 ENCOUNTER — Other Ambulatory Visit: Payer: Self-pay | Admitting: Urology

## 2019-06-11 DIAGNOSIS — N186 End stage renal disease: Secondary | ICD-10-CM | POA: Diagnosis not present

## 2019-06-11 DIAGNOSIS — N2581 Secondary hyperparathyroidism of renal origin: Secondary | ICD-10-CM | POA: Diagnosis not present

## 2019-06-11 DIAGNOSIS — D631 Anemia in chronic kidney disease: Secondary | ICD-10-CM | POA: Diagnosis not present

## 2019-06-11 DIAGNOSIS — Z992 Dependence on renal dialysis: Secondary | ICD-10-CM | POA: Diagnosis not present

## 2019-06-11 DIAGNOSIS — D509 Iron deficiency anemia, unspecified: Secondary | ICD-10-CM | POA: Diagnosis not present

## 2019-06-12 ENCOUNTER — Telehealth: Payer: Self-pay | Admitting: Pain Medicine

## 2019-06-12 DIAGNOSIS — E291 Testicular hypofunction: Secondary | ICD-10-CM | POA: Diagnosis not present

## 2019-06-12 NOTE — Telephone Encounter (Signed)
Patient lvmail stating he has been to doctor in [place]. He states his pain is back and he cannot walk. He has prn orders for procedures. Please call

## 2019-06-12 NOTE — Telephone Encounter (Signed)
Offered appt for repeat lumbar facet block. Patient unsure if he wants to do this. He will call us back tomorrow and let us know.

## 2019-06-14 DIAGNOSIS — M545 Low back pain: Secondary | ICD-10-CM | POA: Diagnosis not present

## 2019-06-14 DIAGNOSIS — E118 Type 2 diabetes mellitus with unspecified complications: Secondary | ICD-10-CM | POA: Diagnosis not present

## 2019-06-14 DIAGNOSIS — I2510 Atherosclerotic heart disease of native coronary artery without angina pectoris: Secondary | ICD-10-CM | POA: Diagnosis not present

## 2019-06-14 DIAGNOSIS — G8929 Other chronic pain: Secondary | ICD-10-CM | POA: Diagnosis not present

## 2019-06-14 DIAGNOSIS — E782 Mixed hyperlipidemia: Secondary | ICD-10-CM | POA: Diagnosis not present

## 2019-06-14 DIAGNOSIS — Z6841 Body Mass Index (BMI) 40.0 and over, adult: Secondary | ICD-10-CM | POA: Diagnosis not present

## 2019-06-14 DIAGNOSIS — I10 Essential (primary) hypertension: Secondary | ICD-10-CM | POA: Diagnosis not present

## 2019-06-14 DIAGNOSIS — I6381 Other cerebral infarction due to occlusion or stenosis of small artery: Secondary | ICD-10-CM | POA: Diagnosis not present

## 2019-06-14 DIAGNOSIS — R000 Tachycardia, unspecified: Secondary | ICD-10-CM | POA: Diagnosis not present

## 2019-06-14 DIAGNOSIS — Z720 Tobacco use: Secondary | ICD-10-CM | POA: Diagnosis not present

## 2019-06-14 DIAGNOSIS — N185 Chronic kidney disease, stage 5: Secondary | ICD-10-CM | POA: Diagnosis not present

## 2019-06-21 ENCOUNTER — Ambulatory Visit (HOSPITAL_BASED_OUTPATIENT_CLINIC_OR_DEPARTMENT_OTHER): Payer: PPO | Admitting: Pain Medicine

## 2019-06-21 ENCOUNTER — Encounter: Payer: Self-pay | Admitting: Pain Medicine

## 2019-06-21 ENCOUNTER — Ambulatory Visit
Admission: RE | Admit: 2019-06-21 | Discharge: 2019-06-21 | Disposition: A | Discharge status: Home / Self Care | Payer: PPO | Source: Ambulatory Visit | Attending: Pain Medicine | Admitting: Pain Medicine

## 2019-06-21 ENCOUNTER — Other Ambulatory Visit: Payer: Self-pay

## 2019-06-21 VITALS — BP 145/85 | HR 112 | Temp 97.4°F | Resp 18 | Ht 71.0 in | Wt 326.3 lb

## 2019-06-21 DIAGNOSIS — Z7901 Long term (current) use of anticoagulants: Secondary | ICD-10-CM | POA: Insufficient documentation

## 2019-06-21 DIAGNOSIS — M5137 Other intervertebral disc degeneration, lumbosacral region: Secondary | ICD-10-CM | POA: Insufficient documentation

## 2019-06-21 DIAGNOSIS — M47817 Spondylosis without myelopathy or radiculopathy, lumbosacral region: Secondary | ICD-10-CM | POA: Insufficient documentation

## 2019-06-21 DIAGNOSIS — Z6841 Body Mass Index (BMI) 40.0 and over, adult: Secondary | ICD-10-CM | POA: Diagnosis not present

## 2019-06-21 DIAGNOSIS — M47816 Spondylosis without myelopathy or radiculopathy, lumbar region: Secondary | ICD-10-CM | POA: Diagnosis not present

## 2019-06-21 MED ORDER — ROPIVACAINE HCL 2 MG/ML IJ SOLN
INTRAMUSCULAR | Status: AC
  Filled 2019-06-21: qty 20

## 2019-06-21 MED ORDER — LIDOCAINE HCL 2 % IJ SOLN
INTRAMUSCULAR | Status: AC
  Filled 2019-06-21: qty 20

## 2019-06-21 MED ORDER — TRIAMCINOLONE ACETONIDE 40 MG/ML IJ SUSP
INTRAMUSCULAR | Status: AC
  Filled 2019-06-21: qty 1

## 2019-06-21 MED ORDER — MIDAZOLAM HCL 5 MG/5ML IJ SOLN
1.0000 mg | INTRAMUSCULAR | Status: DC | PRN
  Administered 2019-06-21: 11:00:00 2 mg via INTRAVENOUS

## 2019-06-21 MED ORDER — FENTANYL CITRATE (PF) 100 MCG/2ML IJ SOLN
INTRAMUSCULAR | Status: AC
  Filled 2019-06-21: qty 2

## 2019-06-21 MED ORDER — ROPIVACAINE HCL 2 MG/ML IJ SOLN
18.0000 mL | Freq: Once | INTRAMUSCULAR | Status: AC
  Administered 2019-06-21: 18 mL via PERINEURAL

## 2019-06-21 MED ORDER — TRIAMCINOLONE ACETONIDE 40 MG/ML IJ SUSP
80.0000 mg | Freq: Once | INTRAMUSCULAR | Status: AC
  Administered 2019-06-21: 80 mg

## 2019-06-21 MED ORDER — LACTATED RINGERS IV SOLN
1000.0000 mL | Freq: Once | INTRAVENOUS | Status: AC
  Administered 2019-06-21: 1000 mL via INTRAVENOUS

## 2019-06-21 MED ORDER — MIDAZOLAM HCL 5 MG/5ML IJ SOLN
INTRAMUSCULAR | Status: AC
  Filled 2019-06-21: qty 5

## 2019-06-21 MED ORDER — FENTANYL CITRATE (PF) 100 MCG/2ML IJ SOLN
25.0000 ug | INTRAMUSCULAR | Status: DC | PRN
  Administered 2019-06-21: 50 ug via INTRAVENOUS

## 2019-06-21 MED ORDER — LIDOCAINE HCL 2 % IJ SOLN
20.0000 mL | Freq: Once | INTRAMUSCULAR | Status: AC
  Administered 2019-06-21: 400 mg

## 2019-06-21 NOTE — Patient Instructions (Signed)
____________________________________________________________________________________________  Post-Procedure Discharge Instructions  Instructions:  Apply ice:   Purpose: This will minimize any swelling and discomfort after procedure.   When: Day of procedure, as soon as you get home.  How: Fill a plastic sandwich bag with crushed ice. Cover it with a small towel and apply to injection site.  How long: (15 min on, 15 min off) Apply for 15 minutes then remove x 15 minutes.  Repeat sequence on day of procedure, until you go to bed.  Apply heat:   Purpose: To treat any soreness and discomfort from the procedure.  When: Starting the next day after the procedure.  How: Apply heat to procedure site starting the day following the procedure.  How long: May continue to repeat daily, until discomfort goes away.  Food intake: Start with clear liquids (like water) and advance to regular food, as tolerated.   Physical activities: Keep activities to a minimum for the first 8 hours after the procedure. After that, then as tolerated.  Driving: If you have received any sedation, be responsible and do not drive. You are not allowed to drive for 24 hours after having sedation.  Blood thinner: (Applies only to those taking blood thinners) You may restart your blood thinner 6 hours after your procedure.  Insulin: (Applies only to Diabetic patients taking insulin) As soon as you can eat, you may resume your normal dosing schedule.  Infection prevention: Keep procedure site clean and dry. Shower daily and clean area with soap and water.  Post-procedure Pain Diary: Extremely important that this be done correctly and accurately. Recorded information will be used to determine the next step in treatment. For the purpose of accuracy, follow these rules:  Evaluate only the area treated. Do not report or include pain from an untreated area. For the purpose of this evaluation, ignore all other areas of pain,  except for the treated area.  After your procedure, avoid taking a long nap and attempting to complete the pain diary after you wake up. Instead, set your alarm clock to go off every hour, on the hour, for the initial 8 hours after the procedure. Document the duration of the numbing medicine, and the relief you are getting from it.  Do not go to sleep and attempt to complete it later. It will not be accurate. If you received sedation, it is likely that you were given a medication that may cause amnesia. Because of this, completing the diary at a later time may cause the information to be inaccurate. This information is needed to plan your care.  Follow-up appointment: Keep your post-procedure follow-up evaluation appointment after the procedure (usually 2 weeks for most procedures, 6 weeks for radiofrequencies). DO NOT FORGET to bring you pain diary with you.   Expect: (What should I expect to see with my procedure?)  From numbing medicine (AKA: Local Anesthetics): Numbness or decrease in pain. You may also experience some weakness, which if present, could last for the duration of the local anesthetic.  Onset: Full effect within 15 minutes of injected.  Duration: It will depend on the type of local anesthetic used. On the average, 1 to 8 hours.   From steroids (Applies only if steroids were used): Decrease in swelling or inflammation. Once inflammation is improved, relief of the pain will follow.  Onset of benefits: Depends on the amount of swelling present. The more swelling, the longer it will take for the benefits to be seen. In some cases, up to 10 days.  Duration: Steroids will stay in the system x 2 weeks. Duration of benefits will depend on multiple posibilities including persistent irritating factors.  Side-effects: If present, they may typically last 2 weeks (the duration of the steroids).  Frequent: Cramps (if they occur, drink Gatorade and take over-the-counter Magnesium 450-500 mg  once to twice a day); water retention with temporary weight gain; increases in blood sugar; decreased immune system response; increased appetite.  Occasional: Facial flushing (red, warm cheeks); mood swings; menstrual changes.  Uncommon: Long-term decrease or suppression of natural hormones; bone thinning. (These are more common with higher doses or more frequent use. This is why we prefer that our patients avoid having any injection therapies in other practices.)   Very Rare: Severe mood changes; psychosis; aseptic necrosis.  From procedure: Some discomfort is to be expected once the numbing medicine wears off. This should be minimal if ice and heat are applied as instructed.  Call if: (When should I call?)  You experience numbness and weakness that gets worse with time, as opposed to wearing off.  New onset bowel or bladder incontinence. (Applies only to procedures done in the spine)  Emergency Numbers:  Durning business hours (Monday - Thursday, 8:00 AM - 4:00 PM) (Friday, 9:00 AM - 12:00 Noon): [phone]  After hours: [phone]  NOTE: If you are having a problem and are unable connect with, or to talk to a provider, then go to your nearest urgent care or emergency department. If the problem is serious and urgent, please call 911. ____________________________________________________________________________________________    ______________________________________________________________________________________________  Weight Management Required  URGENT: Your weight has been found to be adversely affecting your health.  Dear [name]:  Your current Estimated body mass index is 45.72 kg/m as calculated from the following:   Height as of 05/22/19: '5\' 11"'$  (1.803 m).   Weight as of 05/22/19: 327 lb 12.8 oz (148.7 kg).  Please use the table below to identify your weight category and associated incidence of chronic pain, secondary to your weight.  Body Mass Index (BMI)  Classification BMI level (kg/m2) Category Associated incidence of chronic pain  <18  Underweight   18.5-24.9 Ideal body weight   25-29.9 Overweight  20%  30-34.9 Obese (Class I)  68%  35-39.9 Severe obesity (Class II)  136%  >40 Extreme obesity (Class III)  254%   In addition: You will be considered "Morbidly Obese", if your BMI is above 30 and you have one or more of the following conditions which are known to be caused and/or directly associated with obesity: 1.    Type 2 Diabetes (Which in turn can lead to cardiovascular diseases (CVD), stroke, peripheral vascular diseases (PVD), retinopathy, nephropathy, and neuropathy) 2.    Cardiovascular Disease (High Blood Pressure; Congestive Heart Failure; High Cholesterol; Coronary Artery Disease; Angina; or History of Heart Attacks) 3.    Breathing problems (Asthma; obesity-hypoventilation syndrome; obstructive sleep apnea; chronic inflammatory airway disease; reactive airway disease; or shortness of breath) 4.    Chronic kidney disease 5.    Liver disease (nonalcoholic fatty liver disease) 6.    High blood pressure 7.    Acid reflux (gastroesophageal reflux disease; heartburn) 8.    Osteoarthritis (OA) (with any of the following: hip pain; knee pain; and/or low back pain) 9.    Low back pain (Lumbar Facet Syndrome; and/or Degenerative Disc Disease) 10.  Hip pain (Osteoarthritis of hip) (For every 1 lbs of added body weight, there is a 2 lbs increase in  pressure inside of each hip articulation. 1:2 mechanical relationship) 11.  Knee pain (Osteoarthritis of knee) (For every 1 lbs of added body weight, there is a 4 lbs increase in pressure inside of each knee articulation. 1:4 mechanical relationship) (patients with a BMI>30 kg/m2 were 6.8 times more likely to develop knee OA than normal-weight individuals) 12.  Cancer: Epidemiological studies have shown that obesity is a risk factor for: post-menopausal breast cancer; cancers of the endometrium, colon  and kidney cancer; malignant adenomas of the oesophagus. Obese subjects have an approximately 1.5-3.5-fold increased risk of developing these cancers compared with normal-weight subjects, and it has been estimated that between 15 and 45% of these cancers can be attributed to overweight. More recent studies suggest that obesity may also increase the risk of other types of cancer, including pancreatic, hepatic and gallbladder cancer. (Ref: Obesity and cancer. Pischon T, Nthlings U, [employer] H. Proc Nutr Soc. 2008 May;67(2):128-45. doi: 95.2841/L2440102725366440.) The International Agency for Research on Cancer (IARC) has identified 13 cancers associated with overweight and obesity: meningioma, multiple myeloma, adenocarcinoma of the esophagus, and cancers of the thyroid, postmenopausal breast cancer, gallbladder, stomach, liver, pancreas, kidney, ovaries, uterus, colon and rectal (colorectal) cancers. 7 percent of all cancers diagnosed in women and 24 percent of those diagnosed in men are associated with overweight and obesity.  Recommendation: At this point it is urgent that you take a step back and concentrate in loosing weight. Dedicate 100% of your efforts on this task. Nothing else will improve your health more than bringing your weight down and your BMI to less than 30. If you are here, you probably have chronic pain. Because most chronic pain patients have difficulty exercising secondary to their pain, you must rely on proper nutrition and diet in order to lose the weight. If your BMI is above 40, you should seriously consider bariatric surgery. A realistic goal is to lose 10% of your body weight over a period of 12 months.  Be honest to yourself, if over time you have unsuccessfully tried to lose weight, then it is time for you to seek professional help and to enter a medically supervised weight management program, and/or undergo bariatric surgery. Stop procrastinating.   Pain management considerations:    1.    Pharmacological Problems: Be advised that the use of opioid analgesics (oxycodone; hydrocodone; morphine; methadone; codeine; and all of their derivatives) have been associated with decreased metabolism and weight gain.  For this reason, should we see that you are unable to lose weight while taking these medications, it may become necessary for [place] to taper down and indefinitely discontinue them.  2.    Technical Problems: The incidence of successful interventional therapies decreases as the patient's BMI increases. It is much more difficult to accomplish a safe and effective interventional therapy on a patient with a BMI above 35. 3.    Radiation Exposure Problems: The x-rays machine, used to accomplish injection therapies, will automatically increase their x-ray output in order to capture an appropriate bone image. This means that radiation exposure increases exponentially with the patient's BMI. (The higher the BMI, the higher the radiation exposure.) Although the level of radiation used at a given time is still safe to the patient, it is not for the physician and/or assisting staff. Unfortunately, radiation exposure is accumulative. Because physicians and the staff have to do procedures and be exposed on a daily basis, this can result in health problems such as cancer and radiation burns. Radiation exposure to the  staff is monitored by the radiation batches that they wear. The exposure levels are reported back to the staff on a quarterly basis. Depending on levels of exposure, physicians and staff may be obligated by law to decrease this exposure. This means that they have the right and obligation to refuse providing therapies where they may be overexposed to radiation. For this reason, physicians may decline to offer therapies such as radiofrequency ablation or implants to patients with a BMI above 40. 4.    Current Trends: Be advised that the current trend is to no longer offer certain therapies to  patients with a BMI equal to, or above 35, due to increase perioperative risks, increased technical procedural difficulties, and excessive radiation exposure to healthcare personnel.  ______________________________________________________________________________________________    ____________________________________________________________________________________________  Pain Management Weight Control Diet   Note: Before starting this diet, make sure to talk to your primary care physician to be sure it is safe for you.   BMI:  Your current Estimated body mass index is 45.72 kg/m as calculated from the following:   Height as of 05/22/19: '5\' 11"'$  (1.803 m).   Weight as of 05/22/19: 327 lb 12.8 oz (148.7 kg).  Breakfast:   Drink 12 oz of cold water 15-30 minutes prior to meal  1 boiled or pouched egg. You may use the egg white ready-made preparations.  1 wheat low calorie toast.   8 oz. black coffee without milk, creamer, or any type of sweetener. (If a sweetener must be used, then use stevia).   Lunch & Dinner:   Drink 12-24 oz. of ice water, 30 minutes prior to meal  5 oz. Lean Protein like chicken, fish, or lean meat (above 95% fat free).   No cold cuts or processed meats. (No red meat)  Steamed, baked or grilled. (Never fried).  No oils, fats, lard, or butter.   1 cup of steamed vegetables, or 1.5 cups if raw.   1 serving of salad or greens (No dressings, except vinegar or lemon juice).   Mid-day & Mid-afternoon snack:   1 fruit. No bananas. (maximum of 2 fruits per day)   Important Rules:  1. Drink water. Must drink 100 oz. or more of water per day.   Consult your Primary Care Physician if you have a history of kidney failure, or congestive heart failure before doing this.  2. Take calcium and magnesium every day to avoid night cramps.   Consult your Primary Care Physician if you have a history of kidney failure, hypercalcemia, or parathyroid problems before doing  this.   Over-the-counter calcium 1200 mg/day (in the morning). Take with Vitamin D 5000 IU every day.   Over-the-counter magnesium 400 to 500 mg per day (1-2 hours prior to bedtime).  3. Avoid salt. Never add any salt and avoid salty foods. Do not take more than 2,300 mg/day. 4. Avoid eating anything after 6:00 pm.  5. Weight yourself every morning at the same time and record weight on a notebook.  6. Mix "Benefiber" 3 to 5 table spoons in water and drink before each meals.  7. No sodas.  8. No alcohol.  9. No sugar.  10. No artificial sweeteners.   Stevia without sugar is the only sweetener aloud.  11. No bread except for the one breakfast toast.   Low calorie wheat bread.  12. Duration of diet: 2 weeks at a time with 2 days' rest, then repeat, until BMI is less than 30.  13. Restrict food volume.  When feeding restrict the total volume of food that you eat at a meal. Never eat more than what you can hold in the palm of one hand. 14. Eat very slow. Chew food twenty (20) times before swallowing. 15. Restrict amount of times that you eat. Do not feed any sooner than every 4 hours. 16. Do not over-eat or over-indulge yourself in the 2 days of rest from the diet.  17. Follow ancient [place] customs. Have your meals sitting on the floor.  You will notice that your stomach will be creating an increased abdominal pressure that will not allow you to overeat. (This last one may be difficult or impossible for most chronic pain patients).     ____________________________________________________________________________________________

## 2019-06-21 NOTE — Progress Notes (Signed)
PROVIDER NOTE: Information contained herein reflects review and annotations entered in association with encounter. Interpretation of such information and data should be left to medically-trained personnel. Information provided to patient can be located elsewhere in the medical record under "Patient Instructions". Document created using STT-dictation technology, any transcriptional errors that may result from process are unintentional.    Patient: Donald Fernandez  Service Category: Procedure  Provider: [name], MD  DOB: [birth year]  DOS: 06/21/2019  Location: [facility]  MRN: [id number]  Setting: Ambulatory - outpatient  Referring Provider: Denton Lank, MD  Type: Established Patient  Specialty: Interventional Pain Management  PCP: Denton Lank, MD   Primary Reason for Visit: Interventional Pain Management Treatment. CC: Back Pain (bilateral)  Procedure:          Anesthesia, Analgesia, Anxiolysis:  Type: Lumbar Facet, Medial Branch Block(s) #3  Primary Purpose: Diagnostic Region: Posterolateral Lumbosacral Spine Level: L2, L3, L4, L5, & S1 Medial Branch Level(s). Injecting these levels blocks the L3-4, L4-5, and L5-S1 lumbar facet joints. Laterality: Bilateral  Type: Moderate (Conscious) Sedation combined with Local Anesthesia Indication(s): Analgesia and Anxiety Route: Intravenous (IV) IV Access: Secured Sedation: Meaningful verbal contact was maintained at all times during the procedure  Local Anesthetic: Lidocaine 1-2%  Position: Prone   Indications: 1. Lumbar facet syndrome (Bilateral) (L>R)   2. Spondylosis without myelopathy or radiculopathy, lumbosacral region   3. Lumbar facet hypertrophy (Multilevel) (Bilateral)   4. Osteoarthritis of facet joint of lumbar spine   5. Osteoarthritis of lumbar spine   6. DDD (degenerative disc disease), lumbosacral   7. Chronic anticoagulation (Plavix)    Pain Score: Pre-procedure: 4 /10 Post-procedure: 0-No pain/10    Pre-op Assessment:  Donald Fernandez is a 49 y.o. (year old), male patient, seen today for interventional treatment. He  has a past surgical history that includes Ankle surgery (Right); AV fistula placement (Left, 08/04/2016); IR ANGIO INTRA EXTRACRAN SEL INTERNAL CAROTID UNI R MOD SED (12/17/2016); Radiology with anesthesia (N/A, 12/17/2016); DIALYSIS/PERMA CATHETER INSERTION (N/A, 06/06/2017); A/V Fistulagram (N/A, 06/08/2017); AV Fistula Insertion w/RF Magnetic Guidance (Left, 09/02/2017); Upper Extremity Angiography (Left, 10/19/2017); Revison of arteriovenous fistula (Left, 01/04/2018); and DIALYSIS/PERMA CATHETER REMOVAL (N/A, 05/04/2018). Donald Fernandez has a current medication list which includes the following prescription(s): amlodipine, anastrozole, aspirin ec, atorvastatin, azelastine, calcium acetate, carvedilol, cholecalciferol, clomiphene, clonidine, clopidogrel, cyclobenzaprine, famotidine, fluticasone, furosemide, gabapentin, montelukast, oxycodone-acetaminophen, rybelsus, rybelsus, sodium bicarbonate, tadalafil, tadalafil, tamsulosin, testosterone, topiramate, trazodone, and vitamin e, and the following Facility-Administered Medications: fentanyl and midazolam. His primarily concern today is the Back Pain (bilateral)  Initial Vital Signs:  Pulse/HCG Rate: (!) 112ECG Heart Rate: (!) 110 Temp: (!) 97.5 F (36.4 C) Resp: 18 BP: (!) 160/107 SpO2: 100 %  BMI: Estimated body mass index is 45.51 kg/m as calculated from the following:   Height as of this encounter: 5\' 11"  (1.803 m).   Weight as of this encounter: 326 lb 4.5 oz (148 kg).  Risk Assessment: Allergies: Reviewed. He is allergic to accupril [quinapril hcl] and ace inhibitors.  Allergy Precautions: None required Coagulopathies: Reviewed. None identified.  Blood-thinner therapy: None at this time Active Infection(s): Reviewed. None identified. Donald Fernandez is afebrile  Site Confirmation: Donald Fernandez was asked to confirm the procedure and  laterality before marking the site Procedure checklist: Completed Consent: Before the procedure and under the influence of no sedative(s), amnesic(s), or anxiolytics, the patient was informed of the treatment options, risks and possible complications. To fulfill our ethical and legal obligations, as  recommended by the American Medical Association's Code of Ethics, I have informed the patient of my clinical impression; the nature and purpose of the treatment or procedure; the risks, benefits, and possible complications of the intervention; the alternatives, including doing nothing; the risk(s) and benefit(s) of the alternative treatment(s) or procedure(s); and the risk(s) and benefit(s) of doing nothing. The patient was provided information about the general risks and possible complications associated with the procedure. These may include, but are not limited to: failure to achieve desired goals, infection, bleeding, organ or nerve damage, allergic reactions, paralysis, and death. In addition, the patient was informed of those risks and complications associated to Spine-related procedures, such as failure to decrease pain; infection (i.e.: Meningitis, epidural or intraspinal abscess); bleeding (i.e.: epidural hematoma, subarachnoid hemorrhage, or any other type of intraspinal or peri-dural bleeding); organ or nerve damage (i.e.: Any type of peripheral nerve, nerve root, or spinal cord injury) with subsequent damage to sensory, motor, and/or autonomic systems, resulting in permanent pain, numbness, and/or weakness of one or several areas of the body; allergic reactions; (i.e.: anaphylactic reaction); and/or death. Furthermore, the patient was informed of those risks and complications associated with the medications. These include, but are not limited to: allergic reactions (i.e.: anaphylactic or anaphylactoid reaction(s)); adrenal axis suppression; blood sugar elevation that in diabetics may result in  ketoacidosis or comma; water retention that in patients with history of congestive heart failure may result in shortness of breath, pulmonary edema, and decompensation with resultant heart failure; weight gain; swelling or edema; medication-induced neural toxicity; particulate matter embolism and blood vessel occlusion with resultant organ, and/or nervous system infarction; and/or aseptic necrosis of one or more joints. Finally, the patient was informed that Medicine is not an exact science; therefore, there is also the possibility of unforeseen or unpredictable risks and/or possible complications that may result in a catastrophic outcome. The patient indicated having understood very clearly. We have given the patient no guarantees and we have made no promises. Enough time was given to the patient to ask questions, all of which were answered to the patient's satisfaction. Mr. Betty has indicated that he wanted to continue with the procedure. Attestation: I, the ordering provider, attest that I have discussed with the patient the benefits, risks, side-effects, alternatives, likelihood of achieving goals, and potential problems during recovery for the procedure that I have provided informed consent. Date  Time: 06/21/2019  9:29 AM  Pre-Procedure Preparation:  Monitoring: As per clinic protocol. Respiration, ETCO2, SpO2, BP, heart rate and rhythm monitor placed and checked for adequate function Safety Precautions: Patient was assessed for positional comfort and pressure points before starting the procedure. Time-out: I initiated and conducted the "Time-out" before starting the procedure, as per protocol. The patient was asked to participate by confirming the accuracy of the "Time Out" information. Verification of the correct person, site, and procedure were performed and confirmed by me, the nursing staff, and the patient. "Time-out" conducted as per Joint Commission's Universal Protocol (UP.01.01.01). Time:  1037  Description of Procedure:          Laterality: Bilateral. The procedure was performed in identical fashion on both sides. Levels:  L2, L3, L4, L5, & S1 Medial Branch Level(s) Area Prepped: Posterior Lumbosacral Region Prepping solution: DuraPrep (Iodine Povacrylex [0.7% available iodine] and Isopropyl Alcohol, 74% w/w) Safety Precautions: Aspiration looking for blood return was conducted prior to all injections. At no point did we inject any substances, as a needle was being advanced. Before injecting, the patient  was told to immediately notify me if he was experiencing any new onset of "ringing in the ears, or metallic taste in the mouth". No attempts were made at seeking any paresthesias. Safe injection practices and needle disposal techniques used. Medications properly checked for expiration dates. SDV (single dose vial) medications used. After the completion of the procedure, all disposable equipment used was discarded in the proper designated medical waste containers. Local Anesthesia: Protocol guidelines were followed. The patient was positioned over the fluoroscopy table. The area was prepped in the usual manner. The time-out was completed. The target area was identified using fluoroscopy. A 12-in long, straight, sterile hemostat was used with fluoroscopic guidance to locate the targets for each level blocked. Once located, the skin was marked with an approved surgical skin marker. Once all sites were marked, the skin (epidermis, dermis, and hypodermis), as well as deeper tissues (fat, connective tissue and muscle) were infiltrated with a small amount of a short-acting local anesthetic, loaded on a 10cc syringe with a 25G, 1.5-in  Needle. An appropriate amount of time was allowed for local anesthetics to take effect before proceeding to the next step. Local Anesthetic: Lidocaine 2.0% The unused portion of the local anesthetic was discarded in the proper designated containers. Technical  explanation of process:  L2 Medial Branch Nerve Block (MBB): The target area for the L2 medial branch is at the junction of the postero-lateral aspect of the superior articular process and the superior, posterior, and medial edge of the transverse process of L3. Under fluoroscopic guidance, a Quincke needle was inserted until contact was made with os over the superior postero-lateral aspect of the pedicular shadow (target area). After negative aspiration for blood, 0.5 mL of the nerve block solution was injected without difficulty or complication. The needle was removed intact. L3 Medial Branch Nerve Block (MBB): The target area for the L3 medial branch is at the junction of the postero-lateral aspect of the superior articular process and the superior, posterior, and medial edge of the transverse process of L4. Under fluoroscopic guidance, a Quincke needle was inserted until contact was made with os over the superior postero-lateral aspect of the pedicular shadow (target area). After negative aspiration for blood, 0.5 mL of the nerve block solution was injected without difficulty or complication. The needle was removed intact. L4 Medial Branch Nerve Block (MBB): The target area for the L4 medial branch is at the junction of the postero-lateral aspect of the superior articular process and the superior, posterior, and medial edge of the transverse process of L5. Under fluoroscopic guidance, a Quincke needle was inserted until contact was made with os over the superior postero-lateral aspect of the pedicular shadow (target area). After negative aspiration for blood, 0.5 mL of the nerve block solution was injected without difficulty or complication. The needle was removed intact. L5 Medial Branch Nerve Block (MBB): The target area for the L5 medial branch is at the junction of the postero-lateral aspect of the superior articular process and the superior, posterior, and medial edge of the sacral ala. Under  fluoroscopic guidance, a Quincke needle was inserted until contact was made with os over the superior postero-lateral aspect of the pedicular shadow (target area). After negative aspiration for blood, 0.5 mL of the nerve block solution was injected without difficulty or complication. The needle was removed intact. S1 Medial Branch Nerve Block (MBB): The target area for the S1 medial branch is at the posterior and inferior 6 o'clock position of the L5-S1 facet joint.  Under fluoroscopic guidance, the Quincke needle inserted for the L5 MBB was redirected until contact was made with os over the inferior and postero aspect of the sacrum, at the 6 o' clock position under the L5-S1 facet joint (Target area). After negative aspiration for blood, 0.5 mL of the nerve block solution was injected without difficulty or complication. The needle was removed intact.  Nerve block solution: 0.2% PF-Ropivacaine + Triamcinolone (40 mg/mL) diluted to a final concentration of 4 mg of Triamcinolone/mL of Ropivacaine The unused portion of the solution was discarded in the proper designated containers. Procedural Needles: 22-gauge, 3.5-inch, Quincke needles used for all levels.  Once the entire procedure was completed, the treated area was cleaned, making sure to leave some of the prepping solution back to take advantage of its long term bactericidal properties.   Illustration of the posterior view of the lumbar spine and the posterior neural structures. Laminae of L2 through S1 are labeled. DPRL5, dorsal primary ramus of L5; DPRS1, dorsal primary ramus of S1; DPR3, dorsal primary ramus of L3; FJ, facet (zygapophyseal) joint L3-L4; I, inferior articular process of L4; LB1, lateral branch of dorsal primary ramus of L1; IAB, inferior articular branches from L3 medial branch (supplies L4-L5 facet joint); IBP, intermediate branch plexus; MB3, medial branch of dorsal primary ramus of L3; NR3, third lumbar nerve root; S, superior  articular process of L5; SAB, superior articular branches from L4 (supplies L4-5 facet joint also); TP3, transverse process of L3.  Vitals:   06/21/19 1047 06/21/19 1057 06/21/19 1107 06/21/19 1118  BP: (!) 161/119 (!) 151/79 (!) 144/82 (!) 145/85  Pulse:      Resp: (!) 22 (!) 26 17 18   Temp:  (!) 97.5 F (36.4 C)  (!) 97.4 F (36.3 C)  SpO2: 96% 100% 100% 100%  Weight:      Height:         Start Time: 1037 hrs. End Time: 1046 hrs.  Imaging Guidance (Spinal):          Type of Imaging Technique: Fluoroscopy Guidance (Spinal) Indication(s): Assistance in needle guidance and placement for procedures requiring needle placement in or near specific anatomical locations not easily accessible without such assistance. Exposure Time: Please see nurses notes. Contrast: None used. Fluoroscopic Guidance: I was personally present during the use of fluoroscopy. "Tunnel Vision Technique" used to obtain the best possible view of the target area. Parallax error corrected before commencing the procedure. "Direction-depth-direction" technique used to introduce the needle under continuous pulsed fluoroscopy. Once target was reached, antero-posterior, oblique, and lateral fluoroscopic projection used confirm needle placement in all planes. Images permanently stored in EMR. Interpretation: No contrast injected. I personally interpreted the imaging intraoperatively. Adequate needle placement confirmed in multiple planes. Permanent images saved into the patient's record.  Antibiotic Prophylaxis:   Anti-infectives (From admission, onward)   None     Indication(s): None identified  Post-operative Assessment:  Post-procedure Vital Signs:  Pulse/HCG Rate: (!) 11299 Temp: (!) 97.4 F (36.3 C) Resp: 18 BP: (!) 145/85 SpO2: 100 %  EBL: None  Complications: No immediate post-treatment complications observed by team, or reported by patient.  Note: The patient tolerated the entire procedure well. A  repeat set of vitals were taken after the procedure and the patient was kept under observation following institutional policy, for this type of procedure. Post-procedural neurological assessment was performed, showing return to baseline, prior to discharge. The patient was provided with post-procedure discharge instructions, including a section on how to identify potential  problems. Should any problems arise concerning this procedure, the patient was given instructions to immediately contact us, at any time, without hesitation. In any case, we plan to contact the patient by telephone for a follow-up status report regarding this interventional procedure.  Comments:  No additional relevant information.  Plan of Care  Orders:  Orders Placed This Encounter  Procedures  . LUMBAR FACET(MEDIAL BRANCH NERVE BLOCK) MBNB    Scheduling Instructions:     Procedure: Lumbar facet block (AKA.: Lumbosacral medial branch nerve block)     Side: Bilateral     Level: L3-4, L4-5, & L5-S1 Facets (L2, L3, L4, L5, & S1 Medial Branch Nerves)     Sedation: Patient's choice.     Timeframe: Today    Order Specific Question:   Where will this procedure be performed?    Answer:   [facility]  . DG [facility] C-ARM 1-60 MIN NO REPORT    Intraoperative interpretation by procedural physician at [place].    Standing Status:   Standing    Number of Occurrences:   1    Order Specific Question:   Reason for exam:    Answer:   Assistance in needle guidance and placement for procedures requiring needle placement in or near specific anatomical locations not easily accessible without such assistance.  . Informed Consent Details: Physician/Practitioner Attestation; Transcribe to consent form and obtain patient signature    Nursing Order: Transcribe to consent form and obtain patient signature. Note: Always confirm laterality of pain with Mr. Vanderburg, before procedure. Procedure: Lumbar Facet Block  under  fluoroscopic guidance Indication/Reason: Low Back Pain, with our without leg pain, due to Facet Joint Arthralgia (Joint Pain) known as Lumbar Facet Syndrome, secondary to Lumbar, and/or Lumbosacral Spondylosis (Arthritis of the Spine), without myelopathy or radiculopathy (Nerve Damage). Provider Attestation: I, [place] Dossie Arbour, MD, (Pain Management Specialist), the physician/practitioner, attest that I have discussed with the patient the benefits, risks, side effects, alternatives, likelihood of achieving goals and potential problems during recovery for the procedure that I have provided informed consent.  . Care order/instruction: Please confirm that the patient has stopped the Plavix (Clopidogrel) x 7-10 days prior to procedure or surgery.    Please confirm that the patient has stopped the Plavix (Clopidogrel) x 7-10 days prior to procedure or surgery.    Standing Status:   Standing    Number of Occurrences:   1  . Provide equipment / supplies at bedside    Equipment required: Single use, disposable, "Block Tray"    Standing Status:   Standing    Number of Occurrences:   1    Order Specific Question:   Specify    Answer:   Block Tray  . Bleeding precautions    Standing Status:   Standing    Number of Occurrences:   1   Chronic Opioid Analgesic:  No opioid analgesics prescribed by our practice. Oxycodone/APAP 5/325 1 tab PO 4 hours (Written by Jarvis Morgan, PA-C) Highest recorded MME/day: 50 mg/day MME/day: 50 mg/day   Medications ordered for procedure: Meds ordered this encounter  Medications  . lidocaine (XYLOCAINE) 2 % (with pres) injection 400 mg  . lactated ringers infusion 1,000 mL  . midazolam (VERSED) 5 MG/5ML injection 1-2 mg    Make sure Flumazenil is available in the pyxis when using this medication. If oversedation occurs, administer 0.2 mg IV over 15 sec. If after 45 sec no response, administer 0.2 mg again over  1 min; may repeat at 1 min intervals; not to exceed  4 doses (1 mg)  . fentaNYL (SUBLIMAZE) injection 25-50 mcg    Make sure Narcan is available in the pyxis when using this medication. In the event of respiratory depression (RR< 8/min): Titrate NARCAN (naloxone) in increments of 0.1 to 0.2 mg IV at 2-3 minute intervals, until desired degree of reversal.  . ropivacaine (PF) 2 mg/mL (0.2%) (NAROPIN) injection 18 mL  . triamcinolone acetonide (KENALOG-40) injection 80 mg   Medications administered: We administered lidocaine, lactated ringers, midazolam, fentaNYL, ropivacaine (PF) 2 mg/mL (0.2%), and triamcinolone acetonide.  See the medical record for exact dosing, route, and time of administration.  Follow-up plan:   Return in about 2 weeks (around 07/05/2019) for (VV), (PP).       Interventional treatment options: Planned, scheduled, and/or pending:      Under consideration NOTE: PLAVIX ANTICOAGULATION (Stop: 7-10 days  Re-start: 2 hrs) Diagnostic bilateral SI joint injection  Diagnostic bilateral IA hip joint injections  Palliative bilateral knee joint injections  Note: Eventually when the patient brings his BMI below 35, we may be able to offer him radiofrequency ablation of the lumbar facets.  (No RFA until BMI<35)   Therapeutic/palliative (PRN):   Diagnostic/palliative bilateral lumbar facet block #3      Recent Visits Date Type Provider Dept  06/06/19 Telemedicine Milinda Pointer, MD [facility]  05/22/19 Procedure visit Milinda Pointer, MD [facility]  05/14/19 Telemedicine Milinda Pointer, MD [facility]  05/02/19 Telemedicine Milinda Pointer, MD [facility]  04/17/19 Procedure visit Milinda Pointer, MD [facility]  03/26/19 Telemedicine Milinda Pointer, MD [facility]  Showing recent visits within past 90 days and meeting all other requirements   Today's Visits Date Type Provider Dept  06/21/19 Procedure visit Milinda Pointer, MD [facility]  Showing today's visits and meeting all other requirements   Future Appointments Date Type Provider Dept  07/04/19 Appointment Milinda Pointer, MD [facility]  Showing future appointments within next 90 days and meeting all other requirements   Disposition: Discharge home  Discharge (Date  Time): 06/21/2019; 1121 hrs.   Primary Care Physician: Denton Lank, MD Location: [facility] Note by: [name], MD Date: 06/21/2019; Time: 12:27 PM  Disclaimer:  Medicine is not an Chief Strategy Officer. The only guarantee in medicine is that nothing is guaranteed. It is important to note that the decision to proceed with this intervention was based on the information collected from the patient. The Data and conclusions were drawn from the patient's questionnaire, the interview, and the physical examination. Because the information was provided in large part by the patient, it cannot be guaranteed that it has not been purposely or unconsciously manipulated. Every effort has been made to obtain as much relevant data as possible for this evaluation. It is important to note that the conclusions that lead to this procedure are derived in large part from the available data. Always take into account that the treatment will also be dependent on availability of resources and existing treatment guidelines, considered by other Pain Management Practitioners as being common knowledge and practice, at the time of the intervention. For Medico-Legal purposes, it is also important to point out that variation in procedural techniques and pharmacological choices are the acceptable norm. The indications, contraindications, technique, and results of the above procedure should only be interpreted and judged by a Board-Certified Interventional Pain Specialist with extensive familiarity  and expertise in the same exact procedure and technique.

## 2019-06-21 NOTE — Progress Notes (Signed)
Safety precautions to be maintained throughout the outpatient stay will include: orient to surroundings, keep bed in low position, maintain call bell within reach at all times, provide assistance with transfer out of bed and ambulation.  

## 2019-06-22 ENCOUNTER — Telehealth: Payer: Self-pay

## 2019-06-22 NOTE — Telephone Encounter (Signed)
Post procedure phone call.  Patients mother states he has gone to dialysis but was doing well.

## 2019-07-02 DIAGNOSIS — Z992 Dependence on renal dialysis: Secondary | ICD-10-CM | POA: Diagnosis not present

## 2019-07-02 NOTE — Progress Notes (Signed)
Pain relief after procedure (treated area only): (Questions asked to patient) 1. Starting about 15 minutes after the procedure, and "while the area was still numb" (from the local anesthetics), were you having any of your usual pain "in that area" (the treated area)?  (NOTE: NOT including the discomfort from the needle sticks.) First 1 hour:75 % better. First 4-6 hours: 75 % better. 3. How much better is your pain now, when compared to before the procedure? Current benefit:100 % better. 4. Can you move better now? Improvement in ROM (Range of Motion): Yes. 5. Can you do more now? Improvement in function: Yes. 4. Did you have any problems with the procedure? Side-effects/Complications: No.

## 2019-07-03 NOTE — Progress Notes (Signed)
Patient: Donald Fernandez  Service Category: E/M  Provider: [name], MD  DOB: [birth year]  DOS: 07/04/2019  Location: Office  MRN: [id number]  Setting: Ambulatory outpatient  Referring Provider: Denton Lank, MD  Type: Established Patient  Specialty: Interventional Pain Management  PCP: Denton Lank, MD  Location: Remote location  Delivery: TeleHealth     Virtual Encounter - Pain Management PROVIDER NOTE: Information contained herein reflects review and annotations entered in association with encounter. Interpretation of such information and data should be left to medically-trained personnel. Information provided to patient can be located elsewhere in the medical record under "Patient Instructions". Document created using STT-dictation technology, any transcriptional errors that may result from process are unintentional.    Contact & Pharmacy Preferred: [phone] Home: [phone] (home) Mobile: [phone] (mobile) E-mail: [email or web address]  [place] ([address]), [place] [facility] [place] [address] [place] [address] Phone: [phone] Fax: [phone]   Pre-screening  Donald Fernandez offered "in-person" vs "virtual" encounter. He indicated preferring virtual for this encounter.   Reason COVID-19*  Social distancing based on CDC and AMA recommendations.   I contacted Donald Fernandez on 07/04/2019 via telephone.      I clearly identified myself as [name], MD. I verified that I was speaking with the correct person using two identifiers (Name: Donald Fernandez, and date of birth: [birth year]).  Consent I sought verbal advanced consent from Donald Fernandez for virtual visit interactions. I informed Donald Fernandez of possible security and privacy concerns, risks, and limitations associated with providing "not-in-person" medical evaluation and management services. I also informed Donald Fernandez of the availability of "in-person"  appointments. Finally, I informed him that there would be a charge for the virtual visit and that he could be  personally, fully or partially, financially responsible for it. Donald Fernandez expressed understanding and agreed to proceed.   Historic Elements   Donald Fernandez is a 49 y.o. year old, male patient evaluated today after his last contact with our practice on 06/22/2019. Donald Fernandez  has a past medical history of Arthritis, Asthma, BPH (benign prostatic hyperplasia), Diabetes mellitus without complication ([place]), Dialysis patient [facility]), GERD (gastroesophageal reflux disease), Gout, Hyperlipidemia, Hypertension, Neuropathy, Renal disorder, Restless legs, and Sleep apnea. He also  has a past surgical history that includes Ankle surgery (Right); AV fistula placement (Left, 08/04/2016); IR ANGIO INTRA EXTRACRAN SEL INTERNAL CAROTID UNI R MOD SED (12/17/2016); Radiology with anesthesia (N/A, 12/17/2016); DIALYSIS/PERMA CATHETER INSERTION (N/A, 06/06/2017); A/V Fistulagram (N/A, 06/08/2017); AV Fistula Insertion w/RF Magnetic Guidance (Left, 09/02/2017); Upper Extremity Angiography (Left, 10/19/2017); Revison of arteriovenous fistula (Left, 01/04/2018); and DIALYSIS/PERMA CATHETER REMOVAL (N/A, 05/04/2018). Donald Fernandez has a current medication list which includes the following prescription(s): amlodipine, anastrozole, aspirin ec, atorvastatin, azelastine, calcium acetate, carvedilol, cholecalciferol, clomiphene, clonidine, clopidogrel, cyclobenzaprine, famotidine, fluticasone, furosemide, gabapentin, montelukast, oxycodone-acetaminophen, rybelsus, rybelsus, sodium bicarbonate, tadalafil, tamsulosin, testosterone, topiramate, trazodone, and vitamin e. He  reports that he has been smoking cigarettes. He has never used smokeless tobacco. He reports current alcohol use. He reports current drug use. Drug: Marijuana. Donald Fernandez is allergic to accupril [quinapril hcl] and ace inhibitors.   HPI  Today, he is being contacted for  a post-procedure assessment.  The patient indicates having attained excellent relief of the pain from the last bilateral lumbar facet block.  He is currently pain-free.  100% relief.  We have reminded the patient that what this means is that his pain is very  likely coming from those facet joints and that it also has something to do with an inflammatory process.  He was reminded to avoid lifting and twisting at the level of the waist since that is the kind of movement that would irritate and aggravate the pain from the facet joints.  He understood and accepted.  If and when the patient returns he will give [place] a call so that we can repeat this injection.  Post-Procedure Evaluation  Procedure (06/21/2019): Diagnostic/palliative bilateral lumbar facet block #3 under fluoroscopic guidance, and IV sedation Pre-procedure pain level:  4/10 Post-procedure: 0/10 (100% relief)  Sedation: Sedation provided.  Dewayne Shorter, RN  07/02/2019 11:36 AM  Signed Pain relief after procedure (treated area only): (Questions asked to patient) 1. Starting about 15 minutes after the procedure, and "while the area was still numb" (from the local anesthetics), were you having any of your usual pain "in that area" (the treated area)?  (NOTE: NOT including the discomfort from the needle sticks.) First 1 hour:75 % better. First 4-6 hours: 75 % better. 3. How much better is your pain now, when compared to before the procedure? Current benefit:100 % better. 4. Can you move better now? Improvement in ROM (Range of Motion): Yes. 5. Can you do more now? Improvement in function: Yes. 4. Did you have any problems with the procedure? Side-effects/Complications: No.  Current benefits: Defined as benefit that persist at this time.   Analgesia:  100% better Function: Donald Fernandez reports improvement in function ROM: Donald Fernandez reports improvement in ROM  Pharmacotherapy Assessment  Analgesic: No opioid analgesics prescribed by our  practice. Oxycodone/APAP 5/325 1 tab PO 4 hours (Written by Jarvis Morgan, PA-C) Highest recorded MME/day: 50 mg/day MME/day: 50 mg/day   Monitoring: [place] PMP: PDMP reviewed during this encounter.       Pharmacotherapy: No side-effects or adverse reactions reported. Compliance: No problems identified. Effectiveness: Clinically acceptable. Plan: Refer to "POC".  UDS: No results found for: SUMMARY Laboratory Chemistry Profile   Renal Lab Results  Component Value Date   BUN 52 (H) 12/14/2017   CREATININE 12.01 (H) 12/14/2017   GFRAA 5 (L) 12/14/2017   GFRNONAA 4 (L) 12/14/2017    Hepatic Lab Results  Component Value Date   AST 35 06/21/2017   ALT 18 06/21/2017   ALBUMIN 3.5 06/21/2017   ALKPHOS 80 06/21/2017   HCVAB <0.1 06/08/2017    Electrolytes Lab Results  Component Value Date   NA 140 01/04/2018   K 3.5 01/04/2018   CL 100 12/14/2017   CALCIUM 8.4 (L) 12/14/2017   PHOS 5.3 (H) 06/07/2017    Bone No results found for: VD25OH, [id number], YJ8563JS9, FW2637CH8, 25OHVITD1, 25OHVITD2, 25OHVITD3, TESTOFREE, TESTOSTERONE  Inflammation (CRP: Acute Phase) (ESR: Chronic Phase) Lab Results  Component Value Date   LATICACIDVEN 0.7 07/09/2016      Note: Above Lab results reviewed.  Imaging  DG PAIN CLINIC C-ARM 1-60 MIN NO REPORT Fluoro was used, but no Radiologist interpretation will be provided.  Please refer to "NOTES" tab for provider progress note.  Assessment  The primary encounter diagnosis was Chronic low back pain (Primary Area of Pain) (Bilateral) (L>R) w/o sciatica. A diagnosis of Lumbar facet syndrome (Bilateral) (L>R) was also pertinent to this visit.  Plan of Care  Problem-specific:  No problem-specific Assessment & Plan notes found for this encounter.  Mr. JACCOB CZAPLICKI has a current medication list which includes the following long-term medication(s): amlodipine, atorvastatin, azelastine, carvedilol, clonidine, fluticasone, furosemide,  gabapentin, montelukast, and tadalafil.  Pharmacotherapy (Medications Ordered): No orders of the defined types were placed in this encounter.  Orders:  No orders of the defined types were placed in this encounter.  Follow-up plan:   Return if symptoms worsen or fail to improve, for PRN Procedure(s): (B) L-FCT BLK #4, (w/ Sedation).      Interventional treatment options: Planned, scheduled, and/or pending:      Under consideration NOTE: PLAVIX ANTICOAGULATION (Stop: 7-10 days  Re-start: 2 hrs) Diagnostic bilateral SI joint injection  Diagnostic bilateral IA hip joint injections  Palliative bilateral knee joint injections  Note: Eventually when the patient brings his BMI below 35, we may be able to offer him radiofrequency ablation of the lumbar facets.  (No RFA until BMI<35)   Therapeutic/palliative (PRN):   Diagnostic/palliative bilateral lumbar facet block #4     Recent Visits Date Type Provider Dept  06/21/19 Procedure visit Milinda Pointer, MD [facility]  06/06/19 Telemedicine Milinda Pointer, MD [facility]  05/22/19 Procedure visit Milinda Pointer, MD [facility]  05/14/19 Telemedicine Milinda Pointer, MD [facility]  05/02/19 Telemedicine Milinda Pointer, MD [facility]  04/17/19 Procedure visit Milinda Pointer, MD [facility]  Showing recent visits within past 90 days and meeting all other requirements   Today's Visits Date Type Provider Dept  07/04/19 Telemedicine Milinda Pointer, MD [facility]  Showing today's visits and meeting all other requirements   Future Appointments No visits were found meeting these conditions.  Showing future appointments within next 90 days and meeting all other requirements   I discussed the assessment and treatment plan with the patient. The patient was provided an opportunity to ask questions and all were answered. The patient agreed with the  plan and demonstrated an understanding of the instructions.  Patient advised to call back or seek an in-person evaluation if the symptoms or condition worsens.  Duration of encounter: 12 minutes.  Note by: [name], MD Date: 07/04/2019; Time: 1:11 PM

## 2019-07-04 ENCOUNTER — Other Ambulatory Visit: Payer: Self-pay

## 2019-07-04 ENCOUNTER — Ambulatory Visit: Payer: PPO | Attending: Pain Medicine | Admitting: Pain Medicine

## 2019-07-04 DIAGNOSIS — M47816 Spondylosis without myelopathy or radiculopathy, lumbar region: Secondary | ICD-10-CM

## 2019-07-04 DIAGNOSIS — M545 Low back pain, unspecified: Secondary | ICD-10-CM

## 2019-07-04 DIAGNOSIS — G8929 Other chronic pain: Secondary | ICD-10-CM | POA: Diagnosis not present

## 2019-07-04 NOTE — Patient Instructions (Signed)
____________________________________________________________________________________________  Preparing for Procedure with Sedation  Procedure appointments are limited to planned procedures: . No Prescription Refills. . No disability issues will be discussed. . No medication changes will be discussed.  Instructions: . Oral Intake: Do not eat or drink anything for at least 3 hours prior to your procedure. (Exception: Blood Pressure Medication. See below.) . Transportation: Unless otherwise stated by your physician, you may drive yourself after the procedure. . Blood Pressure Medicine: Do not forget to take your blood pressure medicine with a sip of water the morning of the procedure. If your Diastolic (lower reading)is above 100 mmHg, elective cases will be cancelled/rescheduled. . Blood thinners: These will need to be stopped for procedures. Notify our staff if you are taking any blood thinners. Depending on which one you take, there will be specific instructions on how and when to stop it. . Diabetics on insulin: Notify the staff so that you can be scheduled 1st case in the morning. If your diabetes requires high dose insulin, take only  of your normal insulin dose the morning of the procedure and notify the staff that you have done so. . Preventing infections: Shower with an antibacterial soap the morning of your procedure. . Build-up your immune system: Take 1000 mg of Vitamin C with every meal (3 times a day) the day prior to your procedure. . Antibiotics: Inform the staff if you have a condition or reason that requires you to take antibiotics before dental procedures. . Pregnancy: If you are pregnant, call and cancel the procedure. . Sickness: If you have a cold, fever, or any active infections, call and cancel the procedure. . Arrival: You must be in the facility at least 30 minutes prior to your scheduled procedure. . Children: Do not bring children with you. . Dress appropriately:  Bring dark clothing that you would not mind if they get stained. . Valuables: Do not bring any jewelry or valuables.  Reasons to call and reschedule or cancel your procedure: (Following these recommendations will minimize the risk of a serious complication.) . Surgeries: Avoid having procedures within 2 weeks of any surgery. (Avoid for 2 weeks before or after any surgery). . Flu Shots: Avoid having procedures within 2 weeks of a flu shots or . (Avoid for 2 weeks before or after immunizations). . Barium: Avoid having a procedure within 7-10 days after having had a radiological study involving the use of radiological contrast. (Myelograms, Barium swallow or enema study). . Heart attacks: Avoid any elective procedures or surgeries for the initial 6 months after a "Myocardial Infarction" (Heart Attack). . Blood thinners: It is imperative that you stop these medications before procedures. Let us know if you if you take any blood thinner.  . Infection: Avoid procedures during or within two weeks of an infection (including chest colds or gastrointestinal problems). Symptoms associated with infections include: Localized redness, fever, chills, night sweats or profuse sweating, burning sensation when voiding, cough, congestion, stuffiness, runny nose, sore throat, diarrhea, nausea, vomiting, cold or Flu symptoms, recent or current infections. It is specially important if the infection is over the area that we intend to treat. . Heart and lung problems: Symptoms that may suggest an active cardiopulmonary problem include: cough, chest pain, breathing difficulties or shortness of breath, dizziness, ankle swelling, uncontrolled high or unusually low blood pressure, and/or palpitations. If you are experiencing any of these symptoms, cancel your procedure and contact your primary care physician for an evaluation.  Remember:  Regular Business hours are:    Monday to Thursday 8:00 AM to 4:00 PM  Provider's  Schedule: Glenn Christo, MD:  Procedure days: Tuesday and Thursday 7:30 AM to 4:00 PM  Bilal Lateef, MD:  Procedure days: Monday and Wednesday 7:30 AM to 4:00 PM ____________________________________________________________________________________________   ____________________________________________________________________________________________  Blood Thinners  IMPORTANT NOTICE:  If you take any of these, make sure to notify the nursing staff.  Failure to do so may result in injury.  Recommended time intervals to stop and restart blood-thinners, before & after invasive procedures  Generic Name Brand Name Stop Time. Must be stopped at least this long before procedures. After procedures, wait at least this long before re-starting.  Abciximab Reopro 15 days 2 hrs  Alteplase Activase 10 days 10 days  Anagrelide Agrylin    Apixaban Eliquis 3 days 6 hrs  Cilostazol Pletal 3 days 5 hrs  Clopidogrel Plavix 7-10 days 2 hrs  Dabigatran Pradaxa 5 days 6 hrs  Dalteparin Fragmin 24 hours 4 hrs  Dipyridamole Aggrenox 11days 2 hrs  Edoxaban Lixiana; Savaysa 3 days 2 hrs  Enoxaparin  Lovenox 24 hours 4 hrs  Eptifibatide Integrillin 8 hours 2 hrs  Fondaparinux  Arixtra 72 hours 12 hrs  Prasugrel Effient 7-10 days 6 hrs  Reteplase Retavase 10 days 10 days  Rivaroxaban Xarelto 3 days 6 hrs  Ticagrelor Brilinta 5-7 days 6 hrs  Ticlopidine Ticlid 10-14 days 2 hrs  Tinzaparin Innohep 24 hours 4 hrs  Tirofiban Aggrastat 8 hours 2 hrs  Warfarin Coumadin 5 days 2 hrs   Other medications with blood-thinning effects  Product indications Generic (Brand) names Note  Cholesterol Lipitor Stop 4 days before procedure  Blood thinner (injectable) Heparin (LMW or LMWH Heparin) Stop 24 hours before procedure  Cancer Ibrutinib (Imbruvica) Stop 7 days before procedure  Malaria/Rheumatoid Hydroxychloroquine (Plaquenil) Stop 11 days before procedure  Thrombolytics  10 days before or after procedures    Over-the-counter (OTC) Products with blood-thinning effects  Product Common names Stop Time  Aspirin > 325 mg Goody Powders, Excedrin, etc. 11 days  Aspirin ? 81 mg  7 days  Fish oil  4 days  Garlic supplements  7 days  Ginkgo biloba  36 hours  Ginseng  24 hours  NSAIDs Ibuprofen, Naprosyn, etc. 3 days  Vitamin E  4 days   ____________________________________________________________________________________________   

## 2019-07-11 DIAGNOSIS — N186 End stage renal disease: Secondary | ICD-10-CM | POA: Diagnosis not present

## 2019-07-11 DIAGNOSIS — Z992 Dependence on renal dialysis: Secondary | ICD-10-CM | POA: Diagnosis not present

## 2019-07-13 DIAGNOSIS — Z992 Dependence on renal dialysis: Secondary | ICD-10-CM | POA: Diagnosis not present

## 2019-07-13 DIAGNOSIS — D509 Iron deficiency anemia, unspecified: Secondary | ICD-10-CM | POA: Diagnosis not present

## 2019-07-13 DIAGNOSIS — N186 End stage renal disease: Secondary | ICD-10-CM | POA: Diagnosis not present

## 2019-07-13 DIAGNOSIS — N2581 Secondary hyperparathyroidism of renal origin: Secondary | ICD-10-CM | POA: Diagnosis not present

## 2019-07-13 DIAGNOSIS — D631 Anemia in chronic kidney disease: Secondary | ICD-10-CM | POA: Diagnosis not present

## 2019-08-06 DIAGNOSIS — Z992 Dependence on renal dialysis: Secondary | ICD-10-CM | POA: Diagnosis not present

## 2019-08-06 DIAGNOSIS — E119 Type 2 diabetes mellitus without complications: Secondary | ICD-10-CM | POA: Diagnosis not present

## 2019-08-10 DIAGNOSIS — N186 End stage renal disease: Secondary | ICD-10-CM | POA: Diagnosis not present

## 2019-08-10 DIAGNOSIS — Z992 Dependence on renal dialysis: Secondary | ICD-10-CM | POA: Diagnosis not present

## 2019-08-13 DIAGNOSIS — N2581 Secondary hyperparathyroidism of renal origin: Secondary | ICD-10-CM | POA: Diagnosis not present

## 2019-08-13 DIAGNOSIS — D631 Anemia in chronic kidney disease: Secondary | ICD-10-CM | POA: Diagnosis not present

## 2019-08-13 DIAGNOSIS — D509 Iron deficiency anemia, unspecified: Secondary | ICD-10-CM | POA: Diagnosis not present

## 2019-08-13 DIAGNOSIS — Z992 Dependence on renal dialysis: Secondary | ICD-10-CM | POA: Diagnosis not present

## 2019-08-13 DIAGNOSIS — N186 End stage renal disease: Secondary | ICD-10-CM | POA: Diagnosis not present

## 2019-09-03 DIAGNOSIS — Z992 Dependence on renal dialysis: Secondary | ICD-10-CM | POA: Diagnosis not present

## 2019-09-10 DIAGNOSIS — Z992 Dependence on renal dialysis: Secondary | ICD-10-CM | POA: Diagnosis not present

## 2019-09-10 DIAGNOSIS — N186 End stage renal disease: Secondary | ICD-10-CM | POA: Diagnosis not present

## 2019-09-12 ENCOUNTER — Other Ambulatory Visit (INDEPENDENT_AMBULATORY_CARE_PROVIDER_SITE_OTHER): Payer: Self-pay | Admitting: Nurse Practitioner

## 2019-09-12 ENCOUNTER — Other Ambulatory Visit: Payer: Self-pay | Admitting: Urology

## 2019-09-12 DIAGNOSIS — Z992 Dependence on renal dialysis: Secondary | ICD-10-CM | POA: Diagnosis not present

## 2019-09-12 DIAGNOSIS — N186 End stage renal disease: Secondary | ICD-10-CM | POA: Diagnosis not present

## 2019-09-12 DIAGNOSIS — D631 Anemia in chronic kidney disease: Secondary | ICD-10-CM | POA: Diagnosis not present

## 2019-09-12 DIAGNOSIS — D509 Iron deficiency anemia, unspecified: Secondary | ICD-10-CM | POA: Diagnosis not present

## 2019-09-12 DIAGNOSIS — G450 Vertebro-basilar artery syndrome: Secondary | ICD-10-CM

## 2019-09-12 DIAGNOSIS — N2581 Secondary hyperparathyroidism of renal origin: Secondary | ICD-10-CM | POA: Diagnosis not present

## 2019-09-12 DIAGNOSIS — Z23 Encounter for immunization: Secondary | ICD-10-CM | POA: Diagnosis not present

## 2019-09-13 DIAGNOSIS — E349 Endocrine disorder, unspecified: Secondary | ICD-10-CM | POA: Diagnosis not present

## 2019-09-14 ENCOUNTER — Other Ambulatory Visit: Payer: Self-pay | Admitting: Urology

## 2019-09-20 ENCOUNTER — Ambulatory Visit (INDEPENDENT_AMBULATORY_CARE_PROVIDER_SITE_OTHER): Payer: PPO | Admitting: Vascular Surgery

## 2019-09-20 ENCOUNTER — Encounter (INDEPENDENT_AMBULATORY_CARE_PROVIDER_SITE_OTHER): Payer: PPO

## 2019-09-20 DIAGNOSIS — E291 Testicular hypofunction: Secondary | ICD-10-CM | POA: Diagnosis not present

## 2019-10-08 DIAGNOSIS — Z992 Dependence on renal dialysis: Secondary | ICD-10-CM | POA: Diagnosis not present

## 2019-10-10 DIAGNOSIS — N186 End stage renal disease: Secondary | ICD-10-CM | POA: Diagnosis not present

## 2019-10-10 DIAGNOSIS — Z992 Dependence on renal dialysis: Secondary | ICD-10-CM | POA: Diagnosis not present

## 2019-10-12 DIAGNOSIS — N2581 Secondary hyperparathyroidism of renal origin: Secondary | ICD-10-CM | POA: Diagnosis not present

## 2019-10-12 DIAGNOSIS — N186 End stage renal disease: Secondary | ICD-10-CM | POA: Diagnosis not present

## 2019-10-12 DIAGNOSIS — D509 Iron deficiency anemia, unspecified: Secondary | ICD-10-CM | POA: Diagnosis not present

## 2019-10-12 DIAGNOSIS — Z992 Dependence on renal dialysis: Secondary | ICD-10-CM | POA: Diagnosis not present

## 2019-10-18 ENCOUNTER — Ambulatory Visit (INDEPENDENT_AMBULATORY_CARE_PROVIDER_SITE_OTHER): Payer: PPO | Admitting: Vascular Surgery

## 2019-10-18 ENCOUNTER — Other Ambulatory Visit: Payer: Self-pay

## 2019-10-18 ENCOUNTER — Encounter (INDEPENDENT_AMBULATORY_CARE_PROVIDER_SITE_OTHER): Payer: Self-pay | Admitting: Vascular Surgery

## 2019-10-18 ENCOUNTER — Ambulatory Visit (INDEPENDENT_AMBULATORY_CARE_PROVIDER_SITE_OTHER): Payer: PPO

## 2019-10-18 VITALS — BP 175/113 | HR 103 | Resp 16 | Wt 319.0 lb

## 2019-10-18 DIAGNOSIS — Z992 Dependence on renal dialysis: Secondary | ICD-10-CM

## 2019-10-18 DIAGNOSIS — N186 End stage renal disease: Secondary | ICD-10-CM | POA: Diagnosis not present

## 2019-10-18 DIAGNOSIS — K219 Gastro-esophageal reflux disease without esophagitis: Secondary | ICD-10-CM | POA: Diagnosis not present

## 2019-10-18 DIAGNOSIS — T829XXS Unspecified complication of cardiac and vascular prosthetic device, implant and graft, sequela: Secondary | ICD-10-CM | POA: Diagnosis not present

## 2019-10-18 DIAGNOSIS — I10 Essential (primary) hypertension: Secondary | ICD-10-CM

## 2019-10-18 NOTE — Progress Notes (Signed)
MRN : [id number]  Donald Fernandez is a 49 y.o. (05-21-1970) male who presents with chief complaint of  Chief Complaint  Patient presents with  . Follow-up    ultrasound follow up  .  History of Present Illness:   The patient returns to the office for followup status postsuperficializationof the dialysis access 01/04/2018. The patient denies an increase in arm swelling. At the present time the patient denies hand pain.  The patient denies amaurosis fugax or recent TIA symptoms. There are no recent neurological changes noted. The patient denies claudication symptoms or rest pain symptoms. The patient denies history of DVT, PE or superficial thrombophlebitis. The patient denies recent episodes of angina or shortness of breath.  Duplex ultrasound of the AV access shows a patent access with uniform velocities.  No focal hemodynamically significant stenosis. Flow volume is 1570 cc/ml  No outpatient medications have been marked as taking for the 10/18/19 encounter (Office Visit) with Delana Meyer, Dolores Lory, MD.    Past Medical History:  Diagnosis Date  . Arthritis   . Asthma    triggered by enviornmental allergies  . BPH (benign prostatic hyperplasia)   . Diabetes mellitus without complication ([place])   . Dialysis patient [facility])    dialysis T-TH-SAT  . GERD (gastroesophageal reflux disease)   . Gout   . Hyperlipidemia   . Hypertension   . Neuropathy   . Renal disorder    CKD4; end stage renal disease  . Restless legs   . Sleep apnea    uses cpap    Past Surgical History:  Procedure Laterality Date  . A/V FISTULAGRAM N/A 06/08/2017   Procedure: A/V FISTULAGRAM;  Surgeon: Algernon Huxley, MD;  Location: [place] [facility];  Service: Cardiovascular;  Laterality: N/A;  . ANKLE SURGERY Right    fracture repair with pins, plates, screws  . AV FISTULA INSERTION W/ RF MAGNETIC GUIDANCE Left 09/02/2017   Procedure: AV FISTULA INSERTION W/RF MAGNETIC GUIDANCE;  Surgeon: Katha Cabal, MD;  Location: [place] [facility];  Service: Cardiovascular;  Laterality: Left;  . AV FISTULA PLACEMENT Left 08/04/2016   Procedure: ARTERIOVENOUS (AV) FISTULA CREATION ( RADIOCEPHALIC );  Surgeon: Katha Cabal, MD;  Location: [facility];  Service: Vascular;  Laterality: Left;  . DIALYSIS/PERMA CATHETER INSERTION N/A 06/06/2017   Procedure: DIALYSIS/PERMA CATHETER INSERTION;  Surgeon: Algernon Huxley, MD;  Location: [place] [facility];  Service: Cardiovascular;  Laterality: N/A;  . DIALYSIS/PERMA CATHETER REMOVAL N/A 05/04/2018   Procedure: DIALYSIS/PERMA CATHETER REMOVAL;  Surgeon: Algernon Huxley, MD;  Location: [place] [facility];  Service: Cardiovascular;  Laterality: N/A;  . IR ANGIO INTRA EXTRACRAN SEL INTERNAL CAROTID UNI R MOD SED  12/17/2016  . RADIOLOGY WITH ANESTHESIA N/A 12/17/2016   Procedure: Arteriogram/Venogram with right venous sinus manometry;  Surgeon: Consuella Lose, MD;  Location: [place];  Service: Radiology;  Laterality: N/A;  . REVISON OF ARTERIOVENOUS FISTULA Left 01/04/2018   Procedure: REVISON OF ARTERIOVENOUS FISTULA ( BRACHIALCEPHALIC );  Surgeon: Katha Cabal, MD;  Location: [facility];  Service: Vascular;  Laterality: Left;  . UPPER EXTREMITY ANGIOGRAPHY Left 10/19/2017   Procedure: UPPER EXTREMITY ANGIOGRAPHY;  Surgeon: Katha Cabal, MD;  Location: [place] [facility];  Service: Cardiovascular;  Laterality: Left;    Social History Social History   Tobacco Use  . Smoking status: Current Every Day Smoker    Types: Cigarettes  . Smokeless tobacco: Never Used  . Tobacco comment: APPROX. 1  PK/WEEK   Vaping Use  . Vaping Use: Never used  Substance Use Topics  . Alcohol use: Yes    Comment: weekends  . Drug use: Yes    Types: Marijuana    Comment: LAST TIME APPROX. 08/03/17    Family History Family History  Problem Relation Age of Onset  . AAA (abdominal aortic aneurysm) Mother   . Diabetes Mother   . Heart disease Father      Allergies  Allergen Reactions  . Accupril [Quinapril Hcl] Anaphylaxis    Angioedema  . Ace Inhibitors Anaphylaxis and Other (See Comments)    Angioedema Other reaction(s): Unknown Angioedema Angioedema Other reaction(s): Unknown Angioedema Angioedema Angioedema Angioedema      REVIEW OF SYSTEMS (Negative unless checked)  Constitutional: [] Weight loss  [] Fever  [] Chills Cardiac: [] Chest pain   [] Chest pressure   [] Palpitations   [] Shortness of breath when laying flat   [] Shortness of breath with exertion. Vascular:  [] Pain in legs with walking   [] Pain in legs at rest  [] History of DVT   [] Phlebitis   [] Swelling in legs   [] Varicose veins   [] Non-healing ulcers Pulmonary:   [] Uses home oxygen   [] Productive cough   [] Hemoptysis   [] Wheeze  [] COPD   [] Asthma Neurologic:  [] Dizziness   [] Seizures   [] History of stroke   [] History of TIA  [] Aphasia   [] Vissual changes   [] Weakness or numbness in arm   [] Weakness or numbness in leg Musculoskeletal:   [] Joint swelling   [] Joint pain   [] Low back pain Hematologic:  [] Easy bruising  [] Easy bleeding   [] Hypercoagulable state   [] Anemic Gastrointestinal:  [] Diarrhea   [] Vomiting  [] Gastroesophageal reflux/heartburn   [] Difficulty swallowing. Genitourinary:  [x] Chronic kidney disease   [] Difficult urination  [] Frequent urination   [] Blood in urine Skin:  [] Rashes   [] Ulcers  Psychological:  [] History of anxiety   []  History of major depression.  Physical Examination  Vitals:   10/18/19 1611  BP: (!) 175/113  Pulse: (!) 103  Resp: 16  Weight: (!) 319 lb (144.7 kg)   Body mass index is 44.49 kg/m. Gen: WD/WN, NAD Head: [place]/AT, No temporalis wasting.  Ear/Nose/Throat: Hearing grossly intact, nares w/o erythema or drainage Eyes: PER, EOMI, sclera nonicteric.  Neck: Supple, no large masses.   Pulmonary:  Good air movement, no audible wheezing bilaterally, no use of accessory muscles.  Cardiac: RRR, no JVD Vascular: left arm  fistula good thrill good bruit Vessel Right Left  Radial Palpable Palpable  Brachial Palpable Palpable  Gastrointestinal: Non-distended. No guarding/no peritoneal signs.  Musculoskeletal: M/S 5/5 throughout.  No deformity or atrophy.  Neurologic: CN 2-12 intact. Symmetrical.  Speech is fluent. Motor exam as listed above. Psychiatric: Judgment intact, Mood & affect appropriate for pt's clinical situation. Dermatologic: No rashes or ulcers noted.  No changes consistent with cellulitis. Lymph : No lichenification or skin changes of chronic lymphedema.  CBC Lab Results  Component Value Date   WBC 7.9 12/14/2017   HGB 12.6 (L) 01/04/2018   HCT 37.0 (L) 01/04/2018   MCV 93.5 12/14/2017   PLT 247 12/14/2017    BMET    Component Value Date/Time   NA 140 01/04/2018 0628   NA 145 12/31/2011 1850   K 3.5 01/04/2018 0628   K 3.5 12/31/2011 1850   CL 100 12/14/2017 1400   CL 108 (H) 12/31/2011 1850   CO2 22 12/14/2017 1400   CO2 28 12/31/2011 1850   GLUCOSE 97 01/04/2018 0865  GLUCOSE 63 (L) 12/31/2011 1850   BUN 52 (H) 12/14/2017 1400   BUN 14 12/31/2011 1850   CREATININE 12.01 (H) 12/14/2017 1400   CREATININE 1.72 (H) 12/31/2011 1850   CALCIUM 8.4 (L) 12/14/2017 1400   CALCIUM 8.1 (L) 06/06/2017 2038   GFRNONAA 4 (L) 12/14/2017 1400   GFRNONAA 48 (L) 12/31/2011 1850   GFRAA 5 (L) 12/14/2017 1400   GFRAA 55 (L) 12/31/2011 1850   CrCl cannot be calculated (Patient's most recent lab result is older than the maximum 21 days allowed.).  COAG Lab Results  Component Value Date   INR 0.88 12/14/2017   INR 0.90 09/02/2017   INR 0.91 08/08/2017    Radiology No results found.   Assessment/Plan 1. ESRD on hemodialysis ([place]) At the present time the patient has adequate dialysis access.  Continue hemodialysis as ordered without interruption.  Avoid nephrotoxic medications and dehydration.  Further plans per nephrology  2. Complication of vascular access for dialysis,  sequela Recommend:  The patient is doing well and currently has adequate dialysis access. The patient's dialysis center is not reporting any access issues. Flow pattern is stable when compared to the prior ultrasound.  The patient should have a duplex ultrasound of the dialysis access in 6 months. The patient will follow-up with me in the office after each ultrasound    - VAS [place] (AVF, AVG); Future  3. Essential hypertension Continue antihypertensive medications as already ordered, these medications have been reviewed and there are no changes at this time.   4. Gastroesophageal reflux disease without esophagitis Continue PPI as already ordered, this medication has been reviewed and there are no changes at this time.  Avoidence of caffeine and alcohol  Moderate elevation of the head of the bed    Hortencia Pilar, MD  10/18/2019 4:19 PM

## 2019-10-21 ENCOUNTER — Encounter (INDEPENDENT_AMBULATORY_CARE_PROVIDER_SITE_OTHER): Payer: Self-pay | Admitting: Vascular Surgery

## 2019-11-09 DIAGNOSIS — Z79899 Other long term (current) drug therapy: Secondary | ICD-10-CM | POA: Diagnosis not present

## 2019-11-09 DIAGNOSIS — E119 Type 2 diabetes mellitus without complications: Secondary | ICD-10-CM | POA: Diagnosis not present

## 2019-11-10 DIAGNOSIS — Z992 Dependence on renal dialysis: Secondary | ICD-10-CM | POA: Diagnosis not present

## 2019-11-10 DIAGNOSIS — N186 End stage renal disease: Secondary | ICD-10-CM | POA: Diagnosis not present

## 2019-11-12 DIAGNOSIS — Z992 Dependence on renal dialysis: Secondary | ICD-10-CM | POA: Diagnosis not present

## 2019-11-12 DIAGNOSIS — D631 Anemia in chronic kidney disease: Secondary | ICD-10-CM | POA: Diagnosis not present

## 2019-11-12 DIAGNOSIS — N2581 Secondary hyperparathyroidism of renal origin: Secondary | ICD-10-CM | POA: Diagnosis not present

## 2019-11-12 DIAGNOSIS — Z23 Encounter for immunization: Secondary | ICD-10-CM | POA: Diagnosis not present

## 2019-11-12 DIAGNOSIS — D509 Iron deficiency anemia, unspecified: Secondary | ICD-10-CM | POA: Diagnosis not present

## 2019-11-12 DIAGNOSIS — N186 End stage renal disease: Secondary | ICD-10-CM | POA: Diagnosis not present

## 2019-11-22 DIAGNOSIS — N186 End stage renal disease: Secondary | ICD-10-CM | POA: Diagnosis not present

## 2019-11-22 DIAGNOSIS — N2581 Secondary hyperparathyroidism of renal origin: Secondary | ICD-10-CM | POA: Diagnosis not present

## 2019-11-22 DIAGNOSIS — D631 Anemia in chronic kidney disease: Secondary | ICD-10-CM | POA: Diagnosis not present

## 2019-11-22 DIAGNOSIS — Z992 Dependence on renal dialysis: Secondary | ICD-10-CM | POA: Diagnosis not present

## 2019-12-03 DIAGNOSIS — Z992 Dependence on renal dialysis: Secondary | ICD-10-CM | POA: Diagnosis not present

## 2019-12-05 DIAGNOSIS — R6883 Chills (without fever): Secondary | ICD-10-CM | POA: Diagnosis not present

## 2019-12-05 DIAGNOSIS — R0989 Other specified symptoms and signs involving the circulatory and respiratory systems: Secondary | ICD-10-CM | POA: Diagnosis not present

## 2019-12-08 ENCOUNTER — Emergency Department
Admission: EM | Admit: 2019-12-08 | Discharge: 2019-12-08 | Disposition: A | Discharge status: Home / Self Care | Payer: PPO | Attending: Emergency Medicine | Admitting: Emergency Medicine

## 2019-12-08 ENCOUNTER — Other Ambulatory Visit: Payer: Self-pay

## 2019-12-08 ENCOUNTER — Emergency Department: Payer: PPO

## 2019-12-08 DIAGNOSIS — Z79899 Other long term (current) drug therapy: Secondary | ICD-10-CM | POA: Diagnosis not present

## 2019-12-08 DIAGNOSIS — Z992 Dependence on renal dialysis: Secondary | ICD-10-CM | POA: Insufficient documentation

## 2019-12-08 DIAGNOSIS — I120 Hypertensive chronic kidney disease with stage 5 chronic kidney disease or end stage renal disease: Secondary | ICD-10-CM | POA: Insufficient documentation

## 2019-12-08 DIAGNOSIS — J90 Pleural effusion, not elsewhere classified: Secondary | ICD-10-CM | POA: Diagnosis not present

## 2019-12-08 DIAGNOSIS — E1122 Type 2 diabetes mellitus with diabetic chronic kidney disease: Secondary | ICD-10-CM | POA: Insufficient documentation

## 2019-12-08 DIAGNOSIS — R0689 Other abnormalities of breathing: Secondary | ICD-10-CM | POA: Diagnosis not present

## 2019-12-08 DIAGNOSIS — F17210 Nicotine dependence, cigarettes, uncomplicated: Secondary | ICD-10-CM | POA: Insufficient documentation

## 2019-12-08 DIAGNOSIS — R0602 Shortness of breath: Secondary | ICD-10-CM | POA: Diagnosis not present

## 2019-12-08 DIAGNOSIS — R069 Unspecified abnormalities of breathing: Secondary | ICD-10-CM | POA: Diagnosis not present

## 2019-12-08 DIAGNOSIS — R52 Pain, unspecified: Secondary | ICD-10-CM | POA: Diagnosis not present

## 2019-12-08 DIAGNOSIS — R000 Tachycardia, unspecified: Secondary | ICD-10-CM | POA: Diagnosis not present

## 2019-12-08 DIAGNOSIS — N186 End stage renal disease: Secondary | ICD-10-CM | POA: Diagnosis not present

## 2019-12-08 DIAGNOSIS — J441 Chronic obstructive pulmonary disease with (acute) exacerbation: Secondary | ICD-10-CM | POA: Diagnosis not present

## 2019-12-08 DIAGNOSIS — J45901 Unspecified asthma with (acute) exacerbation: Secondary | ICD-10-CM | POA: Diagnosis not present

## 2019-12-08 DIAGNOSIS — R059 Cough, unspecified: Secondary | ICD-10-CM

## 2019-12-08 DIAGNOSIS — I10 Essential (primary) hypertension: Secondary | ICD-10-CM

## 2019-12-08 DIAGNOSIS — Z7982 Long term (current) use of aspirin: Secondary | ICD-10-CM | POA: Diagnosis not present

## 2019-12-08 DIAGNOSIS — R05 Cough: Secondary | ICD-10-CM | POA: Diagnosis present

## 2019-12-08 LAB — CBC WITH DIFFERENTIAL/PLATELET
Abs Immature Granulocytes: 0.01 10*3/uL (ref 0.00–0.07)
Basophils Absolute: 0 10*3/uL (ref 0.0–0.1)
Basophils Relative: 0 %
Eosinophils Absolute: 0.2 10*3/uL (ref 0.0–0.5)
Eosinophils Relative: 4 %
HCT: 30.8 % — ABNORMAL LOW (ref 39.0–52.0)
Hemoglobin: 10.1 g/dL — ABNORMAL LOW (ref 13.0–17.0)
Immature Granulocytes: 0 %
Lymphocytes Relative: 12 %
Lymphs Abs: 0.7 10*3/uL (ref 0.7–4.0)
MCH: 32 pg (ref 26.0–34.0)
MCHC: 32.8 g/dL (ref 30.0–36.0)
MCV: 97.5 fL (ref 80.0–100.0)
Monocytes Absolute: 0.9 10*3/uL (ref 0.1–1.0)
Monocytes Relative: 16 %
Neutro Abs: 3.6 10*3/uL (ref 1.7–7.7)
Neutrophils Relative %: 68 %
Platelets: 178 10*3/uL (ref 150–400)
RBC: 3.16 MIL/uL — ABNORMAL LOW (ref 4.22–5.81)
RDW: 14.7 % (ref 11.5–15.5)
WBC: 5.3 10*3/uL (ref 4.0–10.5)
nRBC: 0 % (ref 0.0–0.2)

## 2019-12-08 LAB — COMPREHENSIVE METABOLIC PANEL
ALT: 26 U/L (ref 0–44)
AST: 48 U/L — ABNORMAL HIGH (ref 15–41)
Albumin: 3.3 g/dL — ABNORMAL LOW (ref 3.5–5.0)
Alkaline Phosphatase: 41 U/L (ref 38–126)
Anion gap: 11 (ref 5–15)
BUN: 28 mg/dL — ABNORMAL HIGH (ref 6–20)
CO2: 26 mmol/L (ref 22–32)
Calcium: 8.8 mg/dL — ABNORMAL LOW (ref 8.9–10.3)
Chloride: 102 mmol/L (ref 98–111)
Creatinine, Ser: 11.39 mg/dL — ABNORMAL HIGH (ref 0.61–1.24)
GFR calc Af Amer: 5 mL/min — ABNORMAL LOW (ref >60)
GFR calc non Af Amer: 5 mL/min — ABNORMAL LOW (ref >60)
Glucose, Bld: 111 mg/dL — ABNORMAL HIGH (ref 70–99)
Potassium: 4.4 mmol/L (ref 3.5–5.1)
Sodium: 139 mmol/L (ref 135–145)
Total Bilirubin: 0.8 mg/dL (ref 0.3–1.2)
Total Protein: 7 g/dL (ref 6.5–8.1)

## 2019-12-08 LAB — TROPONIN I (HIGH SENSITIVITY)
Troponin I (High Sensitivity): 85 ng/L — ABNORMAL HIGH (ref <18)
Troponin I (High Sensitivity): 94 ng/L — ABNORMAL HIGH (ref <18)
Troponin I (High Sensitivity): 67 ng/L — ABNORMAL HIGH (ref <18)

## 2019-12-08 MED ORDER — IPRATROPIUM BROMIDE 0.02 % IN SOLN
0.5000 mg | Freq: Once | RESPIRATORY_TRACT | Status: AC
  Administered 2019-12-08: 0.5 mg via RESPIRATORY_TRACT
  Filled 2019-12-08: qty 2.5

## 2019-12-08 MED ORDER — ALBUTEROL SULFATE (2.5 MG/3ML) 0.083% IN NEBU
10.0000 mg | INHALATION_SOLUTION | Freq: Once | RESPIRATORY_TRACT | Status: AC
  Administered 2019-12-08: 10 mg via RESPIRATORY_TRACT
  Filled 2019-12-08: qty 12

## 2019-12-08 MED ORDER — ALBUTEROL SULFATE (2.5 MG/3ML) 0.083% IN NEBU: 2.5000 mg | INHALATION_SOLUTION | Freq: Once | RESPIRATORY_TRACT | Status: DC

## 2019-12-08 MED ORDER — PREDNISONE 50 MG PO TABS: ORAL_TABLET | ORAL | 0 refills | Status: DC

## 2019-12-08 MED ORDER — CARVEDILOL 6.25 MG PO TABS
3.1250 mg | ORAL_TABLET | Freq: Once | ORAL | Status: AC
  Administered 2019-12-08: 3.125 mg via ORAL
  Filled 2019-12-08: qty 1

## 2019-12-08 MED ORDER — METHYLPREDNISOLONE SODIUM SUCC 125 MG IJ SOLR
125.0000 mg | Freq: Once | INTRAMUSCULAR | Status: AC
  Administered 2019-12-08: 125 mg via INTRAVENOUS
  Filled 2019-12-08: qty 2

## 2019-12-08 MED ORDER — OPTICHAMBER DIAMOND MISC
1.0000 | Freq: Once | Status: AC
  Administered 2019-12-08: 1
  Filled 2019-12-08: qty 1

## 2019-12-08 MED ORDER — AMLODIPINE BESYLATE 5 MG PO TABS
10.0000 mg | ORAL_TABLET | Freq: Once | ORAL | Status: AC
  Administered 2019-12-08: 10 mg via ORAL
  Filled 2019-12-08: qty 2

## 2019-12-08 MED ORDER — CLONIDINE HCL 0.1 MG PO TABS
0.1000 mg | ORAL_TABLET | Freq: Once | ORAL | Status: AC
  Administered 2019-12-08: 0.1 mg via ORAL
  Filled 2019-12-08: qty 1

## 2019-12-08 NOTE — ED Triage Notes (Signed)
Patient reporting short of breath especially after cough.

## 2019-12-08 NOTE — ED Provider Notes (Signed)
[facility] Emergency Department Provider Note ____________________________________________   First MD Initiated Contact with Patient 12/08/19 1329     (approximate)  I have reviewed the triage vital signs and the nursing notes.  HISTORY  Chief Complaint Cough and Shortness of Breath   HPI Donald Fernandez is a 49 y.o. malewho presents to the ED for evaluation of cough and shortness of breath.  Chart review indicates history of ESRD on hemodialysis MWF. Patient reports being a lifelong smoker, but is cut back such that he takes 10 days to finish a pack of cigarettes.  Patient ports 2 days of minimally productive cough.  Reports any dialyzed yesterday, and that they "took extra fluid off because it was wheezing."  Patient reports that a midlevel provider at the dialysis center gave him a prescription for albuterol and  montelukast, which she has been using for the past 24 hours with minimal and transient improvement of his symptoms.  Patient reports getting into coughing fits that exacerbate his subjective dyspnea.  He reports associated substernal chest pain that is worse when he coughs.  4/10 intensity, and he has not taken any medications to assist with this pain.  Denies fevers or productive cough.  Denies syncope, abdominal pain, vomiting or diarrhea.  Past Medical History:  Diagnosis Date  . Arthritis   . Asthma    triggered by enviornmental allergies  . BPH (benign prostatic hyperplasia)   . Diabetes mellitus without complication ([place])   . Dialysis patient [facility])    dialysis T-TH-SAT  . GERD (gastroesophageal reflux disease)   . Gout   . Hyperlipidemia   . Hypertension   . Neuropathy   . Renal disorder    CKD4; end stage renal disease  . Restless legs   . Sleep apnea    uses cpap    Patient Active Problem List   Diagnosis Date Noted  . Morbid obesity with BMI of 45.0-49.9, adult ([place]) 06/21/2019  . Lumbar facet hypertrophy (Multilevel)  (Bilateral) 03/26/2019  . Lumbar foraminal stenosis (Multilevel) (Bilateral) 03/26/2019  . DDD (degenerative disc disease), lumbosacral 03/26/2019  . Sacroiliitis (Bilateral) 03/26/2019  . Osteoarthritis of lumbar spine 03/26/2019  . Osteoarthritis of facet joint of lumbar spine 03/26/2019  . Chronic pain syndrome 03/26/2019  . Pharmacologic therapy 03/26/2019  . Disorder of skeletal system 03/26/2019  . Problems influencing health status 03/26/2019  . Chronic gout 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 tophus 03/26/2019  . Chronic kidney disease (CKD) Stage 5, on chronic dialysis ([place]) 03/26/2019  . Chronic low back pain (Primary Area of Pain) (Bilateral) (L>R) w/o sciatica 03/26/2019  . Lumbar facet syndrome (Bilateral) (L>R) 03/26/2019  . Chronic lower extremity pain (Bilateral) (L>R) 03/26/2019  . Spondylosis without myelopathy or radiculopathy, lumbosacral region 03/26/2019  . Chronic anticoagulation (Plavix) 03/26/2019  . Anemia 03/15/2019  . Occlusion and stenosis of basilar artery 08/24/2018  . ESRD on hemodialysis ([place]) 06/07/2018  . Gastroesophageal reflux disease without esophagitis 06/07/2018  . Personal history of gout 06/07/2018  . Small vessel disease, cerebrovascular 06/07/2018  . Complication of vascular access for dialysis 10/31/2017  . ESRD on dialysis ([place]) 07/27/2017  . Angioedema 06/05/2017  . Pseudotumor cerebri 12/17/2016  . Essential hypertension 08/02/2016  . Diabetes mellitus type 2 in obese ([place]) 08/02/2016  . Papilledema 07/30/2016  . Severe uncontrolled hypertension 07/30/2016  . Acute on chronic renal failure ([place]) 07/09/2016  . OSA (obstructive sleep apnea) 11/27/2014    Past Surgical History:  Procedure Laterality Date  .  A/V FISTULAGRAM N/A 06/08/2017   Procedure: A/V FISTULAGRAM;  Surgeon: Algernon Huxley, MD;  Location: [place] [facility];  Service: Cardiovascular;  Laterality: N/A;  . ANKLE SURGERY Right    fracture repair with pins, plates, screws   . AV FISTULA INSERTION W/ RF MAGNETIC GUIDANCE Left 09/02/2017   Procedure: AV FISTULA INSERTION W/RF MAGNETIC GUIDANCE;  Surgeon: Katha Cabal, MD;  Location: [place] [facility];  Service: Cardiovascular;  Laterality: Left;  . AV FISTULA PLACEMENT Left 08/04/2016   Procedure: ARTERIOVENOUS (AV) FISTULA CREATION ( RADIOCEPHALIC );  Surgeon: Katha Cabal, MD;  Location: [facility];  Service: Vascular;  Laterality: Left;  . DIALYSIS/PERMA CATHETER INSERTION N/A 06/06/2017   Procedure: DIALYSIS/PERMA CATHETER INSERTION;  Surgeon: Algernon Huxley, MD;  Location: [place] [facility];  Service: Cardiovascular;  Laterality: N/A;  . DIALYSIS/PERMA CATHETER REMOVAL N/A 05/04/2018   Procedure: DIALYSIS/PERMA CATHETER REMOVAL;  Surgeon: Algernon Huxley, MD;  Location: [place] [facility];  Service: Cardiovascular;  Laterality: N/A;  . IR ANGIO INTRA EXTRACRAN SEL INTERNAL CAROTID UNI R MOD SED  12/17/2016  . RADIOLOGY WITH ANESTHESIA N/A 12/17/2016   Procedure: Arteriogram/Venogram with right venous sinus manometry;  Surgeon: Consuella Lose, MD;  Location: [place];  Service: Radiology;  Laterality: N/A;  . REVISON OF ARTERIOVENOUS FISTULA Left 01/04/2018   Procedure: REVISON OF ARTERIOVENOUS FISTULA ( BRACHIALCEPHALIC );  Surgeon: Katha Cabal, MD;  Location: [facility];  Service: Vascular;  Laterality: Left;  . UPPER EXTREMITY ANGIOGRAPHY Left 10/19/2017   Procedure: UPPER EXTREMITY ANGIOGRAPHY;  Surgeon: Katha Cabal, MD;  Location: [place] [facility];  Service: Cardiovascular;  Laterality: Left;    Prior to Admission medications   Medication Sig Start Date End Date Taking? Authorizing Provider  amLODipine (NORVASC) 10 MG tablet Take 5 mg by mouth daily.     [provider]  anastrozole (ARIMIDEX) 1 MG tablet Take 1 mg by mouth daily.    [provider]  aspirin EC 81 MG tablet Take 81 mg by mouth daily.    [provider]  atorvastatin (LIPITOR) 20 MG tablet Take  20 mg by mouth daily.     [provider]  azelastine (ASTELIN) 0.1 % nasal spray USE 2 SPRAY(S) IN EACH NOSTRIL TWICE DAILY 04/20/18   [provider]  calcium acetate (PHOSLO) 667 MG capsule TAKE 2 CAPSULES BY MOUTH WITH MEALS AND 1 WITH SNACKS 01/26/18   [provider]  carvedilol (COREG) 3.125 MG tablet Take 3.125 mg by mouth daily. 02/16/18   [provider]  Cholecalciferol 1000 units tablet Take 1,000 Units by mouth daily.     [provider]  clomiPHENE (CLOMID) 50 MG tablet SMARTSIG:.5 Tablet(s) By Mouth Daily 02/11/19   [provider]  cloNIDine (CATAPRES) 0.1 MG tablet Take 1 tablet (0.1 mg total) by mouth 3 (three) times daily. Patient taking differently: Take 0.1 mg by mouth daily.  07/11/16   [name], MD  clopidogrel (PLAVIX) 75 MG tablet Take 1 tablet by mouth once daily 09/12/19   Kris Hartmann, NP  cyclobenzaprine (FLEXERIL) 5 MG tablet Take 5 mg by mouth 3 (three) times daily.    [provider]  famotidine (PEPCID) 20 MG tablet Take 20 mg by mouth 2 (two) times daily. 10/14/18   [provider]  fluticasone (FLONASE) 50 MCG/ACT nasal spray Place 1 spray into both nostrils daily as needed for allergies or rhinitis.    [provider]  furosemide (LASIX) 40 MG tablet Take 40 mg by mouth. Takes on Tues, Thur, Sat, Sun    [provider]  gabapentin (NEURONTIN) 100 MG capsule Take 1 capsule (100 mg total) by mouth 3 (three) times daily. 07/11/16   [name], MD  montelukast (SINGULAIR) 10 MG tablet Take 10 mg by mouth daily. 12/14/18   [provider]  oxyCODONE-acetaminophen (PERCOCET) 10-325 MG tablet Take 1 tablet by mouth every 12 (twelve) hours. 05/12/19   [provider]  predniSONE (DELTASONE) 50 MG tablet Take one pill once per day for the next 4 days, starting on 8/29, Sunday. 12/08/19   Vladimir Crofts, MD  Semaglutide (RYBELSUS) 3 MG TABS Take 7.5 mg by mouth  daily.     [provider]  Semaglutide (RYBELSUS) 3 MG TABS TAKE 1 TABLET BY MOUTH ONCE DAILY FOR DIABETES AND OBESITY 05/26/19   [provider]  sodium bicarbonate 325 MG tablet Take by mouth. 11/30/18   [provider]  tadalafil (CIALIS) 20 MG tablet TAKE 1 TABLET BY MOUTH AS NEEDED 09/26/19   McKenzie, Candee Furbish, MD  tamsulosin (FLOMAX) 0.4 MG CAPS capsule Take 0.8 mg by mouth daily after breakfast.     [provider]  Testosterone (ANDROGEL) 20.25 MG/1.25GM (1.62%) GEL Place 4 Pump onto the skin daily.    [provider]  topiramate (TOPAMAX) 25 MG tablet Take 50 mg by mouth at bedtime.    [provider]  traZODone (DESYREL) 50 MG tablet TAKE 1 2 TO 1 (ONE HALF TO ONE) TABLET BY MOUTH NIGHTLY AT BEDTIME FOR SLEEP 06/05/19   [provider]  vitamin E 400 UNIT capsule Take by mouth.    [provider]    Allergies Accupril [quinapril hcl] and Ace inhibitors  Family History  Problem Relation Age of Onset  . AAA (abdominal aortic aneurysm) Mother   . Diabetes Mother   . Heart disease Father     Social History Social History   Tobacco Use  . Smoking status: Current Every Day Smoker    Types: Cigarettes  . Smokeless tobacco: Never Used  . Tobacco comment: APPROX. 1 PK/WEEK   Vaping Use  . Vaping Use: Never used  Substance Use Topics  . Alcohol use: Yes    Comment: weekends  . Drug use: Yes    Types: Marijuana    Comment: LAST TIME APPROX. 08/03/17    Review of Systems  Constitutional: No fever/chills Eyes: No visual changes. ENT: No sore throat. Cardiovascular: Positive for chest pain Respiratory: Positive for shortness of breath and cough Gastrointestinal: No abdominal pain.  No nausea, no vomiting.  No diarrhea.  No constipation. Genitourinary: Negative for dysuria. Musculoskeletal: Negative for back pain. Skin: Negative for rash. Neurological: Negative for headaches, focal weakness or  numbness.   ____________________________________________   PHYSICAL EXAM:  VITAL SIGNS: Vitals:   12/08/19 1045 12/08/19 1400  BP: (!) 190/110 (!) 209/180  Pulse: (!) 105 91  Resp: 16 10  Temp: 98.8 F (37.1 C)   SpO2: 100% 100%      Constitutional: Alert and oriented. Well appearing and in no acute distress.  Obese, sitting up in bed, pleasant and conversational in full sentences. Eyes: Conjunctivae are normal. PERRL. EOMI. Head: Atraumatic. Nose: No congestion/rhinnorhea. Mouth/Throat: Mucous membranes are moist.  Oropharynx non-erythematous. Neck: No stridor. No cervical spine tenderness to palpation. Cardiovascular: Tachycardic rate, regular rhythm. Grossly normal heart sounds.  Good peripheral circulation. Respiratory: Tachypnea to the low 20s.  Diffuse  expiratory wheezes with prolonged expiratory phase and only moderate air movement throughout.  No focal features auscultation. Gastrointestinal: Soft , nondistended, nontender to palpation. No abdominal bruits. No CVA tenderness. Musculoskeletal: No lower extremity tenderness nor edema.  No joint effusions. No signs of acute trauma. Neurologic:  Normal speech and language. No gross focal neurologic deficits are appreciated. No gait instability noted. Skin:  Skin is warm, dry and intact. No rash noted. Psychiatric: Mood and affect are normal. Speech and behavior are normal.  ____________________________________________   LABS (all labs ordered are listed, but only abnormal results are displayed)  Labs Reviewed  CBC WITH DIFFERENTIAL/PLATELET - Abnormal; Notable for the following components:      Result Value   RBC 3.16 (*)    Hemoglobin 10.1 (*)    HCT 30.8 (*)    All other components within normal limits  COMPREHENSIVE METABOLIC PANEL - Abnormal; Notable for the following components:   Glucose, Bld 111 (*)    BUN 28 (*)    Creatinine, Ser 11.39 (*)    Calcium 8.8 (*)    Albumin 3.3 (*)    AST 48 (*)    GFR  calc non Af Amer 5 (*)    GFR calc Af Amer 5 (*)    All other components within normal limits  TROPONIN I (HIGH SENSITIVITY) - Abnormal; Notable for the following components:   Troponin I (High Sensitivity) 85 (*)    All other components within normal limits  TROPONIN I (HIGH SENSITIVITY) - Abnormal; Notable for the following components:   Troponin I (High Sensitivity) 94 (*)    All other components within normal limits  TROPONIN I (HIGH SENSITIVITY) - Abnormal; Notable for the following components:   Troponin I (High Sensitivity) 67 (*)    All other components within normal limits   ____________________________________________  12 Lead EKG Sinus rhythm, rate of 102 bpm, normal axis and intervals.  No evidence of acute ischemia.  ____________________________________________  RADIOLOGY  ED MD interpretation: CXR without acute cardiopulmonary pathology  Official radiology report(s): DG Chest 2 View  Result Date: 12/08/2019 CLINICAL DATA:  Shortness of breath EXAM: CHEST - 2 VIEW COMPARISON:  2013 FINDINGS: Interstitial prominence, which appears at least partially chronic. No pleural effusion. No pneumothorax. Cardiomediastinal contours are within normal limits. IMPRESSION: Chronic interstitial prominence. There may be mild superimposed interstitial edema. Electronically Signed   By: Macy Mis M.D.   On: 12/08/2019 08:23    ____________________________________________   PROCEDURES and INTERVENTIONS  Procedure(s) performed (including Critical Care):  Procedures  Medications  amLODipine (NORVASC) tablet 10 mg (has no administration in time range)  carvedilol (COREG) tablet 3.125 mg (has no administration in time range)  cloNIDine (CATAPRES) tablet 0.1 mg (has no administration in time range)  optichamber diamond 1 each (has no administration in time range)  methylPREDNISolone sodium succinate (SOLU-MEDROL) 125 mg/2 mL injection 125 mg (125 mg Intravenous Given 12/08/19  1415)  albuterol (PROVENTIL) (2.5 MG/3ML) 0.083% nebulizer solution 10 mg (10 mg Nebulization Given 12/08/19 1416)  ipratropium (ATROVENT) nebulizer solution 0.5 mg (0.5 mg Nebulization Given 12/08/19 1416)    ____________________________________________   MDM / ED COURSE  49 year old ESRD patient presenting with evidence of COPD exacerbation amenable to outpatient management.  Patient with intermittent hypertension, likely at his baseline and due to lack of home BP medications, otherwise normal vital signs on room air.  Exam demonstrates an obese end-stage renal patient who has poor air movement on auscultation, diffuse expiratory wheezes and a  prolonged expiratory phase.  He is otherwise well-appearing without evidence of trauma or neurovascular deficits.  Blood work without acute derangements.  Nonischemic EKG and downtrending troponin.  Patient's chest pain is likely due to his frequent nonproductive coughing and sore chest wall.  Patient reports drastic improvement of his presenting symptoms after albuterol administration and steroids.  While his BP had been downtrending, escalated after albuterol treatment in an asymptomatic fashion.  Provided him his home BP medications this morning that he did not take, and case transferred to oncoming physician to facilitate observation to ensure his BP improves before discharge home.  Clinical Course as of Dec 08 1543  Sat Dec 08, 2019  1441 Reassessed.  Albuterol ongoing.  Patient reports improving symptoms.  Improved air movement auscultation.  Repeat troponin pending   [DS]  1520 Reassessed after albuterol.  Patient reports "feeling a lot better."  Auscultation is improved with improved air movement.  Patient is eager to go home.  Educated patient on downtrending troponin and work-up without additional problems.  We discussed outpatient management of his COPD and following up with his PCP.  We discussed return precautions for the ED.   [DS]  0981 BP  210/100.  I reassessed the patient and he reports to continue to feel better.  Denies chest pain, headache or shortness of breath at this time.  We discussed his home medication regimen, and he tells me that he forgot to tell me earlier that he did not take his morning medications when we initially talked.  We will provide his home BP medications and observe on telemetry until BP improves before likely discharge home. Patient signed out to Dr. Cherylann Banas to facilitate this   [DS]    Clinical Course User Index [DS] Vladimir Crofts, MD     ____________________________________________   FINAL CLINICAL IMPRESSION(S) / ED DIAGNOSES  Final diagnoses:  Cough  COPD exacerbation ([place])  Shortness of breath  Essential hypertension     ED Discharge Orders         Ordered    predniSONE (DELTASONE) 50 MG tablet        12/08/19 1519           Xzandria Clevinger   Note:  This document was prepared using Systems analyst and may include unintentional dictation errors.   Vladimir Crofts, MD 12/08/19 [phone]

## 2019-12-08 NOTE — ED Triage Notes (Signed)
First Nurse: patient brought in by ems from home. Patient had HD yesterday and said he had some extra fluid removed due to wheezing. Patient with complaint of shortness of breath with coughing. Patient was tested negative for covid yesterday. Vital signs stable per ems.

## 2019-12-08 NOTE — ED Notes (Signed)
EDP made aware of patient's BP at time of D/C. Per Dr. Cherylann Banas, okay for discharge.

## 2019-12-08 NOTE — ED Notes (Signed)
Pt states dry cough since 2 d, yesterday during dialysis MD noticed wheezing and prescribed albuterol inhaler.

## 2019-12-08 NOTE — Discharge Instructions (Signed)
You were seen in the ED because of your coughing and difficulty breathing.  This was an exacerbation or worsening of your COPD. I recommend you stop smoking cigarettes.  You are being discharged with a prescription for prednisone steroids, please pick this prescription up from the [facility].  We gave you your dose of steroids for today, please start taking these pills tomorrow, Sunday 8/29.  Continue to use your inhalers at home.  Use the albuterol inhaler every 4 hours for the next few days.  2 puffs at a time.  If you develop any worsening symptoms despite these medications, please return to the ED.

## 2019-12-08 NOTE — ED Notes (Signed)
Pt ambulatory outside without difficulty or distress

## 2019-12-11 DIAGNOSIS — N186 End stage renal disease: Secondary | ICD-10-CM | POA: Diagnosis not present

## 2019-12-11 DIAGNOSIS — Z992 Dependence on renal dialysis: Secondary | ICD-10-CM | POA: Diagnosis not present

## 2019-12-12 DIAGNOSIS — N2581 Secondary hyperparathyroidism of renal origin: Secondary | ICD-10-CM | POA: Diagnosis not present

## 2019-12-12 DIAGNOSIS — D631 Anemia in chronic kidney disease: Secondary | ICD-10-CM | POA: Diagnosis not present

## 2019-12-12 DIAGNOSIS — Z992 Dependence on renal dialysis: Secondary | ICD-10-CM | POA: Diagnosis not present

## 2019-12-12 DIAGNOSIS — D509 Iron deficiency anemia, unspecified: Secondary | ICD-10-CM | POA: Diagnosis not present

## 2019-12-12 DIAGNOSIS — N186 End stage renal disease: Secondary | ICD-10-CM | POA: Diagnosis not present

## 2019-12-18 ENCOUNTER — Other Ambulatory Visit: Payer: Self-pay | Admitting: Urology

## 2019-12-20 DIAGNOSIS — R948 Abnormal results of function studies of other organs and systems: Secondary | ICD-10-CM | POA: Diagnosis not present

## 2019-12-20 DIAGNOSIS — E291 Testicular hypofunction: Secondary | ICD-10-CM | POA: Diagnosis not present

## 2019-12-27 DIAGNOSIS — E291 Testicular hypofunction: Secondary | ICD-10-CM | POA: Diagnosis not present

## 2020-01-02 DIAGNOSIS — N186 End stage renal disease: Secondary | ICD-10-CM | POA: Diagnosis not present

## 2020-01-02 DIAGNOSIS — D631 Anemia in chronic kidney disease: Secondary | ICD-10-CM | POA: Diagnosis not present

## 2020-01-02 DIAGNOSIS — Z992 Dependence on renal dialysis: Secondary | ICD-10-CM | POA: Diagnosis not present

## 2020-01-02 DIAGNOSIS — N2581 Secondary hyperparathyroidism of renal origin: Secondary | ICD-10-CM | POA: Diagnosis not present

## 2020-01-07 DIAGNOSIS — Z992 Dependence on renal dialysis: Secondary | ICD-10-CM | POA: Diagnosis not present

## 2020-01-10 DIAGNOSIS — Z992 Dependence on renal dialysis: Secondary | ICD-10-CM | POA: Diagnosis not present

## 2020-01-10 DIAGNOSIS — N186 End stage renal disease: Secondary | ICD-10-CM | POA: Diagnosis not present

## 2020-01-11 DIAGNOSIS — Z992 Dependence on renal dialysis: Secondary | ICD-10-CM | POA: Diagnosis not present

## 2020-01-11 DIAGNOSIS — N2581 Secondary hyperparathyroidism of renal origin: Secondary | ICD-10-CM | POA: Diagnosis not present

## 2020-01-11 DIAGNOSIS — D509 Iron deficiency anemia, unspecified: Secondary | ICD-10-CM | POA: Diagnosis not present

## 2020-01-11 DIAGNOSIS — D631 Anemia in chronic kidney disease: Secondary | ICD-10-CM | POA: Diagnosis not present

## 2020-01-11 DIAGNOSIS — N186 End stage renal disease: Secondary | ICD-10-CM | POA: Diagnosis not present

## 2020-01-21 DIAGNOSIS — E119 Type 2 diabetes mellitus without complications: Secondary | ICD-10-CM | POA: Diagnosis not present

## 2020-01-21 DIAGNOSIS — Z794 Long term (current) use of insulin: Secondary | ICD-10-CM | POA: Diagnosis not present

## 2020-01-31 DIAGNOSIS — N2581 Secondary hyperparathyroidism of renal origin: Secondary | ICD-10-CM | POA: Diagnosis not present

## 2020-01-31 DIAGNOSIS — Z992 Dependence on renal dialysis: Secondary | ICD-10-CM | POA: Diagnosis not present

## 2020-01-31 DIAGNOSIS — N186 End stage renal disease: Secondary | ICD-10-CM | POA: Diagnosis not present

## 2020-01-31 DIAGNOSIS — D631 Anemia in chronic kidney disease: Secondary | ICD-10-CM | POA: Diagnosis not present

## 2020-02-04 DIAGNOSIS — Z992 Dependence on renal dialysis: Secondary | ICD-10-CM | POA: Diagnosis not present

## 2020-02-10 DIAGNOSIS — N186 End stage renal disease: Secondary | ICD-10-CM | POA: Diagnosis not present

## 2020-02-10 DIAGNOSIS — Z992 Dependence on renal dialysis: Secondary | ICD-10-CM | POA: Diagnosis not present

## 2020-02-11 DIAGNOSIS — Z992 Dependence on renal dialysis: Secondary | ICD-10-CM | POA: Diagnosis not present

## 2020-02-11 DIAGNOSIS — N186 End stage renal disease: Secondary | ICD-10-CM | POA: Diagnosis not present

## 2020-02-13 DIAGNOSIS — Z23 Encounter for immunization: Secondary | ICD-10-CM | POA: Diagnosis not present

## 2020-02-13 DIAGNOSIS — Z992 Dependence on renal dialysis: Secondary | ICD-10-CM | POA: Diagnosis not present

## 2020-02-13 DIAGNOSIS — D509 Iron deficiency anemia, unspecified: Secondary | ICD-10-CM | POA: Diagnosis not present

## 2020-02-13 DIAGNOSIS — D631 Anemia in chronic kidney disease: Secondary | ICD-10-CM | POA: Diagnosis not present

## 2020-02-13 DIAGNOSIS — N186 End stage renal disease: Secondary | ICD-10-CM | POA: Diagnosis not present

## 2020-02-13 DIAGNOSIS — N2581 Secondary hyperparathyroidism of renal origin: Secondary | ICD-10-CM | POA: Diagnosis not present

## 2020-03-03 DIAGNOSIS — Z992 Dependence on renal dialysis: Secondary | ICD-10-CM | POA: Diagnosis not present

## 2020-03-08 ENCOUNTER — Other Ambulatory Visit: Payer: Self-pay | Admitting: Urology

## 2020-03-11 DIAGNOSIS — Z992 Dependence on renal dialysis: Secondary | ICD-10-CM | POA: Diagnosis not present

## 2020-03-11 DIAGNOSIS — N186 End stage renal disease: Secondary | ICD-10-CM | POA: Diagnosis not present

## 2020-03-11 DIAGNOSIS — E8779 Other fluid overload: Secondary | ICD-10-CM | POA: Diagnosis not present

## 2020-03-12 DIAGNOSIS — N186 End stage renal disease: Secondary | ICD-10-CM | POA: Diagnosis not present

## 2020-03-12 DIAGNOSIS — D509 Iron deficiency anemia, unspecified: Secondary | ICD-10-CM | POA: Diagnosis not present

## 2020-03-12 DIAGNOSIS — Z992 Dependence on renal dialysis: Secondary | ICD-10-CM | POA: Diagnosis not present

## 2020-03-12 DIAGNOSIS — N2581 Secondary hyperparathyroidism of renal origin: Secondary | ICD-10-CM | POA: Diagnosis not present

## 2020-03-12 DIAGNOSIS — D631 Anemia in chronic kidney disease: Secondary | ICD-10-CM | POA: Diagnosis not present

## 2020-03-14 DIAGNOSIS — Z79899 Other long term (current) drug therapy: Secondary | ICD-10-CM | POA: Diagnosis not present

## 2020-03-15 ENCOUNTER — Other Ambulatory Visit (INDEPENDENT_AMBULATORY_CARE_PROVIDER_SITE_OTHER): Payer: Self-pay | Admitting: Nurse Practitioner

## 2020-03-15 DIAGNOSIS — G450 Vertebro-basilar artery syndrome: Secondary | ICD-10-CM

## 2020-03-18 DIAGNOSIS — E8779 Other fluid overload: Secondary | ICD-10-CM | POA: Diagnosis not present

## 2020-03-18 DIAGNOSIS — E8770 Fluid overload, unspecified: Secondary | ICD-10-CM | POA: Diagnosis not present

## 2020-03-18 DIAGNOSIS — N186 End stage renal disease: Secondary | ICD-10-CM | POA: Diagnosis not present

## 2020-03-18 DIAGNOSIS — Z992 Dependence on renal dialysis: Secondary | ICD-10-CM | POA: Diagnosis not present

## 2020-04-07 DIAGNOSIS — Z992 Dependence on renal dialysis: Secondary | ICD-10-CM | POA: Diagnosis not present

## 2020-04-08 DIAGNOSIS — N186 End stage renal disease: Secondary | ICD-10-CM | POA: Diagnosis not present

## 2020-04-08 DIAGNOSIS — Z992 Dependence on renal dialysis: Secondary | ICD-10-CM | POA: Diagnosis not present

## 2020-04-11 DIAGNOSIS — N186 End stage renal disease: Secondary | ICD-10-CM | POA: Diagnosis not present

## 2020-04-11 DIAGNOSIS — Z992 Dependence on renal dialysis: Secondary | ICD-10-CM | POA: Diagnosis not present

## 2020-04-22 ENCOUNTER — Other Ambulatory Visit (INDEPENDENT_AMBULATORY_CARE_PROVIDER_SITE_OTHER): Payer: Self-pay | Admitting: Vascular Surgery

## 2020-04-22 DIAGNOSIS — N186 End stage renal disease: Secondary | ICD-10-CM

## 2020-04-24 ENCOUNTER — Encounter (INDEPENDENT_AMBULATORY_CARE_PROVIDER_SITE_OTHER): Payer: Self-pay | Admitting: Vascular Surgery

## 2020-04-24 ENCOUNTER — Ambulatory Visit (INDEPENDENT_AMBULATORY_CARE_PROVIDER_SITE_OTHER): Payer: Medicare Other

## 2020-04-24 ENCOUNTER — Ambulatory Visit (INDEPENDENT_AMBULATORY_CARE_PROVIDER_SITE_OTHER): Payer: Medicare Other | Admitting: Vascular Surgery

## 2020-04-24 ENCOUNTER — Other Ambulatory Visit: Payer: Self-pay

## 2020-04-24 VITALS — BP 87/60 | HR 125 | Resp 16 | Wt 312.0 lb

## 2020-04-24 DIAGNOSIS — I10 Essential (primary) hypertension: Secondary | ICD-10-CM | POA: Diagnosis not present

## 2020-04-24 DIAGNOSIS — E1169 Type 2 diabetes mellitus with other specified complication: Secondary | ICD-10-CM | POA: Diagnosis not present

## 2020-04-24 DIAGNOSIS — E669 Obesity, unspecified: Secondary | ICD-10-CM

## 2020-04-24 DIAGNOSIS — N186 End stage renal disease: Secondary | ICD-10-CM | POA: Diagnosis not present

## 2020-04-24 DIAGNOSIS — T829XXS Unspecified complication of cardiac and vascular prosthetic device, implant and graft, sequela: Secondary | ICD-10-CM

## 2020-04-24 DIAGNOSIS — Z992 Dependence on renal dialysis: Secondary | ICD-10-CM

## 2020-04-24 NOTE — Progress Notes (Signed)
MRN : [id number]  Donald Fernandez is a 50 y.o. (Jun 14, 1970) male who presents with chief complaint of No chief complaint on file. Marland Kitchen  History of Present Illness:   The patient returns to the office for followup status postsuperficializationof the dialysis access 01/04/2018. The patient denies an increase in arm swelling. At the present time the patient denies hand pain.  The patient denies amaurosis fugax or recent TIA symptoms. There are no recent neurological changes noted. The patient denies claudication symptoms or rest pain symptoms. The patient denies history of DVT, PE or superficial thrombophlebitis. The patient denies recent episodes of angina or shortness of breath.  Duplex ultrasound of the AV access shows a patent access with uniform velocities. No focal hemodynamically significant stenosis. Flow volume is 1456 cc/ml (previous Flow volume was 1570 cc/ml)  No outpatient medications have been marked as taking for the 04/24/20 encounter (Appointment) with Delana Meyer, Dolores Lory, MD.    Past Medical History:  Diagnosis Date  . Arthritis   . Asthma    triggered by enviornmental allergies  . BPH (benign prostatic hyperplasia)   . Diabetes mellitus without complication ([place])   . Dialysis patient [facility])    dialysis T-TH-SAT  . GERD (gastroesophageal reflux disease)   . Gout   . Hyperlipidemia   . Hypertension   . Neuropathy   . Renal disorder    CKD4; end stage renal disease  . Restless legs   . Sleep apnea    uses cpap    Past Surgical History:  Procedure Laterality Date  . A/V FISTULAGRAM N/A 06/08/2017   Procedure: A/V FISTULAGRAM;  Surgeon: Algernon Huxley, MD;  Location: [place] [facility];  Service: Cardiovascular;  Laterality: N/A;  . ANKLE SURGERY Right    fracture repair with pins, plates, screws  . AV FISTULA INSERTION W/ RF MAGNETIC GUIDANCE Left 09/02/2017   Procedure: AV FISTULA INSERTION W/RF MAGNETIC GUIDANCE;  Surgeon: Katha Cabal, MD;   Location: [place] [facility];  Service: Cardiovascular;  Laterality: Left;  . AV FISTULA PLACEMENT Left 08/04/2016   Procedure: ARTERIOVENOUS (AV) FISTULA CREATION ( RADIOCEPHALIC );  Surgeon: Katha Cabal, MD;  Location: [facility];  Service: Vascular;  Laterality: Left;  . DIALYSIS/PERMA CATHETER INSERTION N/A 06/06/2017   Procedure: DIALYSIS/PERMA CATHETER INSERTION;  Surgeon: Algernon Huxley, MD;  Location: [place] [facility];  Service: Cardiovascular;  Laterality: N/A;  . DIALYSIS/PERMA CATHETER REMOVAL N/A 05/04/2018   Procedure: DIALYSIS/PERMA CATHETER REMOVAL;  Surgeon: Algernon Huxley, MD;  Location: [place] [facility];  Service: Cardiovascular;  Laterality: N/A;  . IR ANGIO INTRA EXTRACRAN SEL INTERNAL CAROTID UNI R MOD SED  12/17/2016  . RADIOLOGY WITH ANESTHESIA N/A 12/17/2016   Procedure: Arteriogram/Venogram with right venous sinus manometry;  Surgeon: Consuella Lose, MD;  Location: [place];  Service: Radiology;  Laterality: N/A;  . REVISON OF ARTERIOVENOUS FISTULA Left 01/04/2018   Procedure: REVISON OF ARTERIOVENOUS FISTULA ( BRACHIALCEPHALIC );  Surgeon: Katha Cabal, MD;  Location: [facility];  Service: Vascular;  Laterality: Left;  . UPPER EXTREMITY ANGIOGRAPHY Left 10/19/2017   Procedure: UPPER EXTREMITY ANGIOGRAPHY;  Surgeon: Katha Cabal, MD;  Location: [place] [facility];  Service: Cardiovascular;  Laterality: Left;    Social History Social History   Tobacco Use  . Smoking status: Current Every Day Smoker    Types: Cigarettes  . Smokeless tobacco: Never Used  . Tobacco comment: APPROX. 1 PK/WEEK   Vaping Use  . Vaping  Use: Never used  Substance Use Topics  . Alcohol use: Yes    Comment: weekends  . Drug use: Yes    Types: Marijuana    Comment: LAST TIME APPROX. 08/03/17    Family History Family History  Problem Relation Age of Onset  . AAA (abdominal aortic aneurysm) Mother   . Diabetes Mother   . Heart disease Father     Allergies  Allergen  Reactions  . Accupril [Quinapril Hcl] Anaphylaxis    Angioedema  . Ace Inhibitors Anaphylaxis and Other (See Comments)    Angioedema Other reaction(s): Unknown Angioedema Angioedema Other reaction(s): Unknown Angioedema Angioedema Angioedema Angioedema      REVIEW OF SYSTEMS (Negative unless checked)  Constitutional: [] Weight loss  [] Fever  [] Chills Cardiac: [] Chest pain   [] Chest pressure   [] Palpitations   [] Shortness of breath when laying flat   [] Shortness of breath with exertion. Vascular:  [] Pain in legs with walking   [] Pain in legs at rest  [] History of DVT   [] Phlebitis   [] Swelling in legs   [] Varicose veins   [] Non-healing ulcers Pulmonary:   [] Uses home oxygen   [] Productive cough   [] Hemoptysis   [] Wheeze  [] COPD   [] Asthma Neurologic:  [] Dizziness   [] Seizures   [] History of stroke   [] History of TIA  [] Aphasia   [] Vissual changes   [] Weakness or numbness in arm   [] Weakness or numbness in leg Musculoskeletal:   [] Joint swelling   [] Joint pain   [] Low back pain Hematologic:  [] Easy bruising  [] Easy bleeding   [] Hypercoagulable state   [] Anemic Gastrointestinal:  [] Diarrhea   [] Vomiting  [] Gastroesophageal reflux/heartburn   [] Difficulty swallowing. Genitourinary:  [x] Chronic kidney disease   [] Difficult urination  [] Frequent urination   [] Blood in urine Skin:  [] Rashes   [] Ulcers  Psychological:  [] History of anxiety   []  History of major depression.  Physical Examination  There were no vitals filed for this visit. There is no height or weight on file to calculate BMI. Gen: WD/WN, NAD Head: [place]/AT, No temporalis wasting.  Ear/Nose/Throat: Hearing grossly intact, nares w/o erythema or drainage Eyes: PER, EOMI, sclera nonicteric.  Neck: Supple, no large masses.   Pulmonary:  Good air movement, no audible wheezing bilaterally, no use of accessory muscles.  Cardiac: RRR, no JVD Vascular: left AV fistula good thrill and good bruit Vessel Right Left  Radial Palpable  Palpable  Brachial Palpable Palpable  Gastrointestinal: Non-distended. No guarding/no peritoneal signs.  Musculoskeletal: M/S 5/5 throughout.  No deformity or atrophy.  Neurologic: CN 2-12 intact. Symmetrical.  Speech is fluent. Motor exam as listed above. Psychiatric: Judgment intact, Mood & affect appropriate for pt's clinical situation. Dermatologic: No rashes or ulcers noted.  No changes consistent with cellulitis.   CBC Lab Results  Component Value Date   WBC 5.3 12/08/2019   HGB 10.1 (L) 12/08/2019   HCT 30.8 (L) 12/08/2019   MCV 97.5 12/08/2019   PLT 178 12/08/2019    BMET    Component Value Date/Time   NA 139 12/08/2019 0633   NA 145 12/31/2011 1850   K 4.4 12/08/2019 0633   K 3.5 12/31/2011 1850   CL 102 12/08/2019 0633   CL 108 (H) 12/31/2011 1850   CO2 26 12/08/2019 0633   CO2 28 12/31/2011 1850   GLUCOSE 111 (H) 12/08/2019 0633   GLUCOSE 63 (L) 12/31/2011 1850   BUN 28 (H) 12/08/2019 0633   BUN 14 12/31/2011 1850   CREATININE 11.39 (H) 12/08/2019 8329  CREATININE 1.72 (H) 12/31/2011 1850   CALCIUM 8.8 (L) 12/08/2019 1364   CALCIUM 8.1 (L) 06/06/2017 2038   GFRNONAA 5 (L) 12/08/2019 0633   GFRNONAA 48 (L) 12/31/2011 1850   GFRAA 5 (L) 12/08/2019 3837   GFRAA 55 (L) 12/31/2011 1850   CrCl cannot be calculated (Patient's most recent lab result is older than the maximum 21 days allowed.).  COAG Lab Results  Component Value Date   INR 0.88 12/14/2017   INR 0.90 09/02/2017   INR 0.91 08/08/2017    Radiology No results found.   Assessment/Plan 1. Complication of vascular access for dialysis, sequela Recommend:  The patient is doing well and currently has adequate dialysis access. The patient's dialysis center is not reporting any access issues. Flow pattern is stable when compared to the prior ultrasound.  The patient should have a duplex ultrasound of the dialysis access in 6 months. The patient will follow-up with me in the office after each  ultrasound   - VAS [place] (AVF, AVG); Future  2. ESRD on dialysis [facility]) At the present time the patient has adequate dialysis access.  Continue hemodialysis as ordered without interruption.  Avoid nephrotoxic medications and dehydration.  Further plans per nephrology  3. Essential hypertension Continue antihypertensive medications as already ordered, these medications have been reviewed and there are no changes at this time.   4. Diabetes mellitus type 2 in obese ([place]) Continue hypoglycemic medications as already ordered, these medications have been reviewed and there are no changes at this time.  Hgb A1C to be monitored as already arranged by primary service    Hortencia Pilar, MD  04/24/2020 1:12 PM

## 2020-04-27 ENCOUNTER — Encounter (INDEPENDENT_AMBULATORY_CARE_PROVIDER_SITE_OTHER): Payer: Self-pay | Admitting: Vascular Surgery

## 2020-07-10 ENCOUNTER — Ambulatory Visit: Payer: Medicare Other | Attending: Neurology

## 2020-07-10 DIAGNOSIS — Z6841 Body Mass Index (BMI) 40.0 and over, adult: Secondary | ICD-10-CM | POA: Insufficient documentation

## 2020-07-10 DIAGNOSIS — G4733 Obstructive sleep apnea (adult) (pediatric): Secondary | ICD-10-CM | POA: Insufficient documentation

## 2020-07-10 DIAGNOSIS — N186 End stage renal disease: Secondary | ICD-10-CM | POA: Diagnosis not present

## 2020-07-11 ENCOUNTER — Other Ambulatory Visit: Payer: Self-pay

## 2020-09-13 ENCOUNTER — Other Ambulatory Visit (INDEPENDENT_AMBULATORY_CARE_PROVIDER_SITE_OTHER): Payer: Self-pay | Admitting: Nurse Practitioner

## 2020-09-13 DIAGNOSIS — G450 Vertebro-basilar artery syndrome: Secondary | ICD-10-CM

## 2020-10-23 ENCOUNTER — Telehealth (INDEPENDENT_AMBULATORY_CARE_PROVIDER_SITE_OTHER): Payer: Self-pay

## 2020-10-23 ENCOUNTER — Other Ambulatory Visit: Payer: Self-pay

## 2020-10-23 ENCOUNTER — Ambulatory Visit (INDEPENDENT_AMBULATORY_CARE_PROVIDER_SITE_OTHER): Payer: Medicare Other | Admitting: Nurse Practitioner

## 2020-10-23 ENCOUNTER — Ambulatory Visit (INDEPENDENT_AMBULATORY_CARE_PROVIDER_SITE_OTHER): Payer: Medicare Other

## 2020-10-23 ENCOUNTER — Telehealth (INDEPENDENT_AMBULATORY_CARE_PROVIDER_SITE_OTHER): Payer: Self-pay | Admitting: Nurse Practitioner

## 2020-10-23 VITALS — BP 101/68 | HR 91 | Resp 16 | Wt 324.0 lb

## 2020-10-23 DIAGNOSIS — E1169 Type 2 diabetes mellitus with other specified complication: Secondary | ICD-10-CM

## 2020-10-23 DIAGNOSIS — N186 End stage renal disease: Secondary | ICD-10-CM

## 2020-10-23 DIAGNOSIS — Z992 Dependence on renal dialysis: Secondary | ICD-10-CM

## 2020-10-23 DIAGNOSIS — I10 Essential (primary) hypertension: Secondary | ICD-10-CM

## 2020-10-23 DIAGNOSIS — E669 Obesity, unspecified: Secondary | ICD-10-CM

## 2020-10-23 DIAGNOSIS — T829XXS Unspecified complication of cardiac and vascular prosthetic device, implant and graft, sequela: Secondary | ICD-10-CM | POA: Diagnosis not present

## 2020-10-23 NOTE — Telephone Encounter (Signed)
Patient was seen in office today and scheduled for a LUE fistulagram with Dr. Delana Meyer on 10/28/20 with a 10:00 am arrival time to the MM. Pre-procedure instructions were discussed. Patient paperwork states 9:00 am arrival time but the arrival time is 10:00 am and the patient was informed of this as well.

## 2020-10-23 NOTE — Telephone Encounter (Signed)
Patient canceled per his request.

## 2020-10-23 NOTE — Telephone Encounter (Signed)
Patient called back to cancel his procedure after receiving a call from [facility] about what his part of the payment will be. Patient was canceled.

## 2020-10-23 NOTE — Telephone Encounter (Signed)
Patient called to cancel his procedure.  Message sent to Devona Konig for information purposes.

## 2020-10-28 ENCOUNTER — Ambulatory Visit: Admission: RE | Admit: 2020-10-28 | Payer: Medicare Other | Source: Home / Self Care | Admitting: Vascular Surgery

## 2020-10-28 ENCOUNTER — Encounter: Admission: RE | Payer: Self-pay | Source: Home / Self Care

## 2020-10-28 SURGERY — A/V FISTULAGRAM
Anesthesia: Moderate Sedation

## 2020-11-01 NOTE — Progress Notes (Signed)
Subjective:    Patient ID: Donald Fernandez, male    DOB: 1970-07-31, 50 y.o.   MRN: [id number] Chief Complaint  Patient presents with   Follow-up    6 month ultrasound follow up    The patient returns to the office for followup of their dialysis access. The function of the access has been stable. The patient denies increased bleeding time or increased recirculation. Patient denies difficulty with cannulation. The patient denies hand pain or other symptoms consistent with steal phenomena.  No significant arm swelling.  However the patient notes that he does have pain in his dialysis access site when he is being stuck.  This is significant for him.  The patient denies redness or swelling at the access site. The patient denies fever or chills at home or while on dialysis.  The patient denies amaurosis fugax or recent TIA symptoms. There are no recent neurological changes noted. The patient denies claudication symptoms or rest pain symptoms. The patient denies history of DVT, PE or superficial thrombophlebitis. The patient denies recent episodes of angina or shortness of breath.   Today noninvasive studies show a flow volume of 1366.  There is some evidence of stenosis at the confluence although it is not quite significant.       Review of Systems  All other systems reviewed and are negative.     Objective:   Physical Exam Vitals reviewed.  HENT:     Head: Normocephalic.  Cardiovascular:     Rate and Rhythm: Normal rate.     Pulses: Normal pulses.          Radial pulses are 2+ on the left side.     Arteriovenous access: Left arteriovenous access is present.    Comments: Good thrill and bruit Pulmonary:     Effort: Pulmonary effort is normal.  Neurological:     Mental Status: He is alert and oriented to person, place, and time.  Psychiatric:        Mood and Affect: Mood normal.        Behavior: Behavior normal.        Thought Content: Thought content normal.         Judgment: Judgment normal.    BP 101/68 (BP Location: Right Arm)   Pulse 91   Resp 16   Wt (!) 324 lb (147 kg)   BMI 45.19 kg/m   Past Medical History:  Diagnosis Date   Arthritis    Asthma    triggered by enviornmental allergies   BPH (benign prostatic hyperplasia)    Diabetes mellitus without complication (H[facility])    Dialysis patient ([place])    dialysis T-TH-SAT   GERD (gastroesophageal reflux disease)    Gout    Hyperlipidemia    Hypertension    Neuropathy    Renal disorder    CKD4; end stage renal disease   Restless legs    Sleep apnea    uses cpap    Social History   Socioeconomic History   Marital status: Single    Spouse name: Not on file   Number of children: Not on file   Years of education: Not on file   Highest education level: Not on file  Occupational History   Not on file  Tobacco Use   Smoking status: Every Day    Types: Cigarettes   Smokeless tobacco: Never   Tobacco comments:    APPROX. 1 PK/WEEK   Vaping Use   Vaping Use: Never used  Substance and Sexual Activity   Alcohol use: Yes    Comment: weekends   Drug use: Yes    Types: Marijuana    Comment: LAST TIME APPROX. 08/03/17   Sexual activity: Yes    Birth control/protection: Condom  Other Topics Concern   Not on file  Social History Narrative   Not on file   Social Determinants of Health   Financial Resource Strain: Not on file  Food Insecurity: Not on file  Transportation Needs: Not on file  Physical Activity: Not on file  Stress: Not on file  Social Connections: Not on file  Intimate Partner Violence: Not on file    Past Surgical History:  Procedure Laterality Date   A/V FISTULAGRAM N/A 06/08/2017   Procedure: A/V FISTULAGRAM;  Surgeon: Algernon Huxley, MD;  Location: [place] [facility];  Service: Cardiovascular;  Laterality: N/A;   ANKLE SURGERY Right    fracture repair with pins, plates, screws   AV FISTULA INSERTION W/ RF MAGNETIC GUIDANCE Left 09/02/2017   Procedure: AV  FISTULA INSERTION W/RF MAGNETIC GUIDANCE;  Surgeon: Katha Cabal, MD;  Location: [place] [facility];  Service: Cardiovascular;  Laterality: Left;   AV FISTULA PLACEMENT Left 08/04/2016   Procedure: ARTERIOVENOUS (AV) FISTULA CREATION ( RADIOCEPHALIC );  Surgeon: Katha Cabal, MD;  Location: [facility];  Service: Vascular;  Laterality: Left;   DIALYSIS/PERMA CATHETER INSERTION N/A 06/06/2017   Procedure: DIALYSIS/PERMA CATHETER INSERTION;  Surgeon: Algernon Huxley, MD;  Location: [place] [facility];  Service: Cardiovascular;  Laterality: N/A;   DIALYSIS/PERMA CATHETER REMOVAL N/A 05/04/2018   Procedure: DIALYSIS/PERMA CATHETER REMOVAL;  Surgeon: Algernon Huxley, MD;  Location: [place] [facility];  Service: Cardiovascular;  Laterality: N/A;   IR ANGIO INTRA EXTRACRAN SEL INTERNAL CAROTID UNI R MOD SED  12/17/2016   RADIOLOGY WITH ANESTHESIA N/A 12/17/2016   Procedure: Arteriogram/Venogram with right venous sinus manometry;  Surgeon: Consuella Lose, MD;  Location: [place];  Service: Radiology;  Laterality: N/A;   REVISON OF ARTERIOVENOUS FISTULA Left 01/04/2018   Procedure: REVISON OF ARTERIOVENOUS FISTULA ( BRACHIALCEPHALIC );  Surgeon: Katha Cabal, MD;  Location: [facility];  Service: Vascular;  Laterality: Left;   UPPER EXTREMITY ANGIOGRAPHY Left 10/19/2017   Procedure: UPPER EXTREMITY ANGIOGRAPHY;  Surgeon: Katha Cabal, MD;  Location: [place] [facility];  Service: Cardiovascular;  Laterality: Left;    Family History  Problem Relation Age of Onset   AAA (abdominal aortic aneurysm) Mother    Diabetes Mother    Heart disease Father     Allergies  Allergen Reactions   Accupril [Quinapril Hcl] Anaphylaxis    Angioedema   Ace Inhibitors Anaphylaxis and Other (See Comments)    Angioedema Other reaction(s): Unknown Angioedema Angioedema Other reaction(s): Unknown Angioedema Angioedema Angioedema Angioedema     CBC Latest Ref Rng & Units 12/08/2019 01/04/2018 12/14/2017   WBC 4.0 - 10.5 K/uL 5.3 - 7.9  Hemoglobin 13.0 - 17.0 g/dL 10.1(L) 12.6(L) 12.7(L)  Hematocrit 39.0 - 52.0 % 30.8(L) 37.0(L) 36.7(L)  Platelets 150 - 400 K/uL 178 - 247      CMP     Component Value Date/Time   NA 139 12/08/2019 0633   NA 145 12/31/2011 1850   K 4.4 12/08/2019 0633   K 3.5 12/31/2011 1850   CL 102 12/08/2019 0633   CL 108 (H) 12/31/2011 1850   CO2 26 12/08/2019 0633   CO2 28 12/31/2011 1850   GLUCOSE 111 (H) 12/08/2019 [id number]  GLUCOSE 63 (L) 12/31/2011 1850   BUN 28 (H) 12/08/2019 0633   BUN 14 12/31/2011 1850   CREATININE 11.39 (H) 12/08/2019 0633   CREATININE 1.72 (H) 12/31/2011 1850   CALCIUM 8.8 (L) 12/08/2019 0633   CALCIUM 8.1 (L) 06/06/2017 2038   PROT 7.0 12/08/2019 0633   ALBUMIN 3.3 (L) 12/08/2019 0633   AST 48 (H) 12/08/2019 0633   ALT 26 12/08/2019 0633   ALKPHOS 41 12/08/2019 0633   BILITOT 0.8 12/08/2019 0633   GFRNONAA 5 (L) 12/08/2019 0633   GFRNONAA 48 (L) 12/31/2011 1850   GFRAA 5 (L) 12/08/2019 0633   GFRAA 55 (L) 12/31/2011 1850     No results found.     Assessment & Plan:   1. ESRD on dialysis [facility]) Recommend:  The patient is experiencing increasing problems with their dialysis access.  Patient should have a fistulagram with the intention for intervention.  The intention for intervention is to restore appropriate flow and prevent thrombosis and possible loss of the access.  As well as improve the quality of dialysis therapy.  The risks, benefits and alternative therapies were reviewed in detail with the patient.  All questions were answered.  The patient agrees to proceed with angio/intervention.     this note is edited to have that the patient contacted our office shortly after being scheduled for his procedure due to cost from the hospital.    2. Essential hypertension Continue antihypertensive medications as already ordered, these medications have been reviewed and there are no changes at this time.   3. Diabetes  mellitus type 2 in obese [facility]) Continue hypoglycemic medications as already ordered, these medications have been reviewed and there are no changes at this time.  Hgb A1C to be monitored as already arranged by primary service    Current Outpatient Medications on File Prior to Visit  Medication Sig Dispense Refill   albuterol (VENTOLIN HFA) 108 (90 Base) MCG/ACT inhaler INHALE 1 PUFF BY MOUTH 4 TIMES DAILY AS NEEDED FOR WHEEZING     amLODipine (NORVASC) 5 MG tablet Take 5 mg by mouth daily.      anastrozole (ARIMIDEX) 1 MG tablet TAKE ONE-HALF TABLET BY MOUTH ONCE DAILY 45 tablet 0   aspirin EC 81 MG tablet Take 81 mg by mouth daily.     atorvastatin (LIPITOR) 20 MG tablet Take 20 mg by mouth daily.      calcium acetate (PHOSLO) 667 MG capsule TAKE 2 CAPSULES BY MOUTH WITH MEALS AND 1 WITH SNACKS  0   carvedilol (COREG) 3.125 MG tablet Take 3.125 mg by mouth daily.  5   clopidogrel (PLAVIX) 75 MG tablet Take 1 tablet by mouth once daily 90 tablet 0   cyclobenzaprine (FLEXERIL) 5 MG tablet Take 5 mg by mouth 3 (three) times daily.     docusate sodium (COLACE) 100 MG capsule Take 100 mg by mouth daily as needed for mild constipation.     famotidine (PEPCID) 20 MG tablet Take 20 mg by mouth 2 (two) times daily.     fluticasone (FLONASE) 50 MCG/ACT nasal spray Place 1 spray into both nostrils daily as needed for allergies or rhinitis.     furosemide (LASIX) 40 MG tablet Take 40 mg by mouth. Takes on Tues, Thur, Sat, Sun     gabapentin (NEURONTIN) 100 MG capsule Take 1 capsule (100 mg total) by mouth 3 (three) times daily. 90 capsule 2   loperamide (IMODIUM) 2 MG capsule Take by mouth.  methocarbamol (ROBAXIN) 500 MG tablet Take by mouth.     montelukast (SINGULAIR) 10 MG tablet Take 10 mg by mouth daily.     omeprazole (PRILOSEC) 20 MG capsule Take 20 mg by mouth daily.     Semaglutide (RYBELSUS) 3 MG TABS Take 7.5 mg by mouth daily.      Semaglutide (RYBELSUS) 3 MG TABS TAKE 1 TABLET BY  MOUTH ONCE DAILY FOR DIABETES AND OBESITY     sodium bicarbonate 325 MG tablet Take by mouth.     Sodium Zirconium Cyclosilicate (LOKELMA PO) Take by mouth. On non dialysis days     tadalafil (CIALIS) 20 MG tablet TAKE 1 TABLET BY MOUTH AS NEEDED 30 tablet 0   tadalafil (CIALIS) 20 MG tablet Take 1 tablet by mouth as needed.     tamsulosin (FLOMAX) 0.4 MG CAPS capsule Take 0.8 mg by mouth daily after breakfast.      Testosterone 1.62 % GEL SMARTSIG:40.5 Milligram(s) Topical Every Morning     Testosterone 20.25 MG/1.25GM (1.62%) GEL Place 4 Pump onto the skin daily.     topiramate (TOPAMAX) 25 MG capsule Take by mouth.     topiramate (TOPAMAX) 25 MG tablet Take 50 mg by mouth at bedtime.     vitamin E 400 UNIT capsule Take by mouth.     azelastine (ASTELIN) 0.1 % nasal spray USE 2 SPRAY(S) IN EACH NOSTRIL TWICE DAILY (Patient not taking: No sig reported)     Cholecalciferol 1000 units tablet Take 1,000 Units by mouth daily.  (Patient not taking: Reported on 10/23/2020)     clomiPHENE (CLOMID) 50 MG tablet SMARTSIG:.5 Tablet(s) By Mouth Daily (Patient not taking: No sig reported)     cloNIDine (CATAPRES) 0.1 MG tablet Take 1 tablet (0.1 mg total) by mouth 3 (three) times daily. (Patient not taking: Reported on 10/23/2020) 90 tablet 0   oxyCODONE-acetaminophen (PERCOCET) 10-325 MG tablet Take 1 tablet by mouth every 12 (twelve) hours. (Patient not taking: Reported on 10/23/2020)     predniSONE (DELTASONE) 50 MG tablet Take one pill once per day for the next 4 days, starting on 8/29, Sunday. (Patient not taking: No sig reported) 4 tablet 0   traZODone (DESYREL) 50 MG tablet TAKE 1 2 TO 1 (ONE HALF TO ONE) TABLET BY MOUTH NIGHTLY AT BEDTIME FOR SLEEP (Patient not taking: No sig reported)     No current facility-administered medications on file prior to visit.    There are no Patient Instructions on file for this visit. No follow-ups on file.   Kris Hartmann, NP

## 2020-11-02 ENCOUNTER — Encounter (INDEPENDENT_AMBULATORY_CARE_PROVIDER_SITE_OTHER): Payer: Self-pay | Admitting: Nurse Practitioner

## 2020-11-11 ENCOUNTER — Other Ambulatory Visit
Admission: RE | Admit: 2020-11-11 | Discharge: 2020-11-11 | Disposition: A | Discharge status: Home / Self Care | Payer: Medicare Other | Source: Ambulatory Visit | Attending: Family Medicine | Admitting: Family Medicine

## 2020-11-11 ENCOUNTER — Other Ambulatory Visit: Payer: Self-pay

## 2020-11-11 DIAGNOSIS — Z01812 Encounter for preprocedural laboratory examination: Secondary | ICD-10-CM | POA: Diagnosis present

## 2020-11-11 DIAGNOSIS — Z20822 Contact with and (suspected) exposure to covid-19: Secondary | ICD-10-CM | POA: Insufficient documentation

## 2020-11-11 LAB — SARS CORONAVIRUS 2 (TAT 6-24 HRS): SARS Coronavirus 2: NEGATIVE

## 2020-11-13 ENCOUNTER — Ambulatory Visit: Payer: Medicare Other | Attending: Neurology

## 2020-11-13 DIAGNOSIS — Z9989 Dependence on other enabling machines and devices: Secondary | ICD-10-CM | POA: Insufficient documentation

## 2020-11-13 DIAGNOSIS — G4733 Obstructive sleep apnea (adult) (pediatric): Secondary | ICD-10-CM | POA: Insufficient documentation

## 2020-11-14 ENCOUNTER — Observation Stay: Payer: Medicare Other

## 2020-11-14 ENCOUNTER — Observation Stay
Admit: 2020-11-14 | Discharge: 2020-11-14 | Disposition: A | Discharge status: Home / Self Care | Payer: Medicare Other | Attending: Internal Medicine | Admitting: Internal Medicine

## 2020-11-14 ENCOUNTER — Observation Stay
Admission: EM | Admit: 2020-11-14 | Discharge: 2020-11-16 | Disposition: A | Discharge status: Home / Self Care | Payer: Medicare Other | Attending: Internal Medicine | Admitting: Internal Medicine

## 2020-11-14 ENCOUNTER — Other Ambulatory Visit: Payer: Self-pay

## 2020-11-14 ENCOUNTER — Encounter: Payer: Self-pay | Admitting: Internal Medicine

## 2020-11-14 DIAGNOSIS — I120 Hypertensive chronic kidney disease with stage 5 chronic kidney disease or end stage renal disease: Secondary | ICD-10-CM | POA: Diagnosis not present

## 2020-11-14 DIAGNOSIS — I2510 Atherosclerotic heart disease of native coronary artery without angina pectoris: Secondary | ICD-10-CM | POA: Diagnosis present

## 2020-11-14 DIAGNOSIS — F17210 Nicotine dependence, cigarettes, uncomplicated: Secondary | ICD-10-CM | POA: Diagnosis not present

## 2020-11-14 DIAGNOSIS — Z7984 Long term (current) use of oral hypoglycemic drugs: Secondary | ICD-10-CM | POA: Insufficient documentation

## 2020-11-14 DIAGNOSIS — I10 Essential (primary) hypertension: Secondary | ICD-10-CM | POA: Diagnosis present

## 2020-11-14 DIAGNOSIS — Z7982 Long term (current) use of aspirin: Secondary | ICD-10-CM | POA: Diagnosis not present

## 2020-11-14 DIAGNOSIS — N186 End stage renal disease: Secondary | ICD-10-CM | POA: Insufficient documentation

## 2020-11-14 DIAGNOSIS — Z7901 Long term (current) use of anticoagulants: Secondary | ICD-10-CM | POA: Insufficient documentation

## 2020-11-14 DIAGNOSIS — I4729 Other ventricular tachycardia: Secondary | ICD-10-CM

## 2020-11-14 DIAGNOSIS — Z992 Dependence on renal dialysis: Secondary | ICD-10-CM | POA: Insufficient documentation

## 2020-11-14 DIAGNOSIS — R1032 Left lower quadrant pain: Secondary | ICD-10-CM | POA: Insufficient documentation

## 2020-11-14 DIAGNOSIS — J45909 Unspecified asthma, uncomplicated: Secondary | ICD-10-CM | POA: Diagnosis not present

## 2020-11-14 DIAGNOSIS — E1122 Type 2 diabetes mellitus with diabetic chronic kidney disease: Secondary | ICD-10-CM | POA: Diagnosis not present

## 2020-11-14 DIAGNOSIS — Z20822 Contact with and (suspected) exposure to covid-19: Secondary | ICD-10-CM | POA: Insufficient documentation

## 2020-11-14 DIAGNOSIS — G4733 Obstructive sleep apnea (adult) (pediatric): Secondary | ICD-10-CM | POA: Diagnosis present

## 2020-11-14 DIAGNOSIS — E875 Hyperkalemia: Secondary | ICD-10-CM | POA: Insufficient documentation

## 2020-11-14 DIAGNOSIS — Z79899 Other long term (current) drug therapy: Secondary | ICD-10-CM | POA: Diagnosis not present

## 2020-11-14 DIAGNOSIS — F17200 Nicotine dependence, unspecified, uncomplicated: Secondary | ICD-10-CM | POA: Diagnosis present

## 2020-11-14 DIAGNOSIS — I472 Ventricular tachycardia, unspecified: Secondary | ICD-10-CM

## 2020-11-14 LAB — MAGNESIUM: Magnesium: 2.6 mg/dL — ABNORMAL HIGH (ref 1.7–2.4)

## 2020-11-14 LAB — RESP PANEL BY RT-PCR (FLU A&B, COVID) ARPGX2
Influenza A by PCR: NEGATIVE
Influenza B by PCR: NEGATIVE
SARS Coronavirus 2 by RT PCR: NEGATIVE

## 2020-11-14 LAB — CBC WITH DIFFERENTIAL/PLATELET
Abs Immature Granulocytes: 0.03 10*3/uL (ref 0.00–0.07)
Basophils Absolute: 0.1 10*3/uL (ref 0.0–0.1)
Basophils Relative: 1 %
Eosinophils Absolute: 0.5 10*3/uL (ref 0.0–0.5)
Eosinophils Relative: 6 %
HCT: 41 % (ref 39.0–52.0)
Hemoglobin: 13.4 g/dL (ref 13.0–17.0)
Immature Granulocytes: 0 %
Lymphocytes Relative: 25 %
Lymphs Abs: 1.9 10*3/uL (ref 0.7–4.0)
MCH: 32.5 pg (ref 26.0–34.0)
MCHC: 32.7 g/dL (ref 30.0–36.0)
MCV: 99.5 fL (ref 80.0–100.0)
Monocytes Absolute: 0.9 10*3/uL (ref 0.1–1.0)
Monocytes Relative: 12 %
Neutro Abs: 4.4 10*3/uL (ref 1.7–7.7)
Neutrophils Relative %: 56 %
Platelets: 234 10*3/uL (ref 150–400)
RBC: 4.12 MIL/uL — ABNORMAL LOW (ref 4.22–5.81)
RDW: 15.1 % (ref 11.5–15.5)
WBC: 7.7 10*3/uL (ref 4.0–10.5)
nRBC: 0.3 % — ABNORMAL HIGH (ref 0.0–0.2)

## 2020-11-14 LAB — COMPREHENSIVE METABOLIC PANEL
ALT: 13 U/L (ref 0–44)
AST: 12 U/L — ABNORMAL LOW (ref 15–41)
Albumin: 3.9 g/dL (ref 3.5–5.0)
Alkaline Phosphatase: 152 U/L — ABNORMAL HIGH (ref 38–126)
Anion gap: 15 (ref 5–15)
BUN: 61 mg/dL — ABNORMAL HIGH (ref 6–20)
CO2: 26 mmol/L (ref 22–32)
Calcium: 9.3 mg/dL (ref 8.9–10.3)
Chloride: 95 mmol/L — ABNORMAL LOW (ref 98–111)
Creatinine, Ser: 17.48 mg/dL — ABNORMAL HIGH (ref 0.61–1.24)
GFR, Estimated: 3 mL/min — ABNORMAL LOW (ref >60)
Glucose, Bld: 115 mg/dL — ABNORMAL HIGH (ref 70–99)
Potassium: 5 mmol/L (ref 3.5–5.1)
Sodium: 136 mmol/L (ref 135–145)
Total Bilirubin: 0.7 mg/dL (ref 0.3–1.2)
Total Protein: 7.6 g/dL (ref 6.5–8.1)

## 2020-11-14 LAB — CREATININE, SERUM
Creatinine, Ser: 14.54 mg/dL — ABNORMAL HIGH (ref 0.61–1.24)
GFR, Estimated: 4 mL/min — ABNORMAL LOW (ref >60)

## 2020-11-14 LAB — TROPONIN I (HIGH SENSITIVITY)
Troponin I (High Sensitivity): 24 ng/L — ABNORMAL HIGH (ref <18)
Troponin I (High Sensitivity): 20 ng/L — ABNORMAL HIGH (ref <18)

## 2020-11-14 LAB — BRAIN NATRIURETIC PEPTIDE: B Natriuretic Peptide: 9.5 pg/mL (ref 0.0–100.0)

## 2020-11-14 LAB — HEPATITIS B SURFACE ANTIBODY,QUALITATIVE: Hep B S Ab: REACTIVE — AB

## 2020-11-14 LAB — ECHOCARDIOGRAM COMPLETE
Height: 71 in
S' Lateral: 2.78 cm
Weight: 5044.12 oz

## 2020-11-14 LAB — HEPATITIS B SURFACE ANTIGEN: Hepatitis B Surface Ag: NONREACTIVE

## 2020-11-14 LAB — HIV ANTIBODY (ROUTINE TESTING W REFLEX): HIV Screen 4th Generation wRfx: NONREACTIVE

## 2020-11-14 LAB — HEMOGLOBIN A1C
Hgb A1c MFr Bld: 5.9 % — ABNORMAL HIGH (ref 4.8–5.6)
Mean Plasma Glucose: 122.63 mg/dL

## 2020-11-14 MED ORDER — CARVEDILOL 3.125 MG PO TABS: 3.1250 mg | ORAL_TABLET | Freq: Every day | ORAL | Status: DC

## 2020-11-14 MED ORDER — CALCIUM ACETATE (PHOS BINDER) 667 MG PO CAPS
2668.0000 mg | ORAL_CAPSULE | Freq: Three times a day (TID) | ORAL | Status: DC
  Administered 2020-11-14 – 2020-11-16 (×6): 2668 mg via ORAL
  Filled 2020-11-14 (×8): qty 4

## 2020-11-14 MED ORDER — FLUTICASONE PROPIONATE 50 MCG/ACT NA SUSP
1.0000 | Freq: Every day | NASAL | Status: DC | PRN
  Filled 2020-11-14: qty 16

## 2020-11-14 MED ORDER — SODIUM CHLORIDE 0.9 % IV SOLN: 250.0000 mL | INTRAVENOUS | Status: DC | PRN

## 2020-11-14 MED ORDER — SODIUM CHLORIDE 0.9% FLUSH
3.0000 mL | Freq: Two times a day (BID) | INTRAVENOUS | Status: DC
  Administered 2020-11-15 – 2020-11-16 (×2): 3 mL via INTRAVENOUS

## 2020-11-14 MED ORDER — PANTOPRAZOLE SODIUM 40 MG PO TBEC
40.0000 mg | DELAYED_RELEASE_TABLET | Freq: Every day | ORAL | Status: DC
  Administered 2020-11-15 – 2020-11-16 (×2): 40 mg via ORAL
  Filled 2020-11-14 (×2): qty 1

## 2020-11-14 MED ORDER — ACETAMINOPHEN 650 MG RE SUPP: 650.0000 mg | Freq: Four times a day (QID) | RECTAL | Status: DC | PRN

## 2020-11-14 MED ORDER — ANASTROZOLE 1 MG PO TABS
0.5000 mg | ORAL_TABLET | Freq: Every day | ORAL | Status: DC
  Administered 2020-11-15 – 2020-11-16 (×2): 0.5 mg via ORAL
  Filled 2020-11-14 (×3): qty 1

## 2020-11-14 MED ORDER — HEPARIN SODIUM (PORCINE) 5000 UNIT/ML IJ SOLN
5000.0000 [IU] | Freq: Three times a day (TID) | INTRAMUSCULAR | Status: DC
  Administered 2020-11-14 – 2020-11-16 (×5): 5000 [IU] via SUBCUTANEOUS
  Filled 2020-11-14 (×4): qty 1

## 2020-11-14 MED ORDER — ONDANSETRON HCL 4 MG PO TABS: 4.0000 mg | ORAL_TABLET | Freq: Four times a day (QID) | ORAL | Status: DC | PRN

## 2020-11-14 MED ORDER — ALBUMIN HUMAN 25 % IV SOLN
INTRAVENOUS | Status: AC
  Administered 2020-11-14: 12.5 g
  Filled 2020-11-14: qty 50

## 2020-11-14 MED ORDER — ACETAMINOPHEN 325 MG PO TABS
650.0000 mg | ORAL_TABLET | Freq: Four times a day (QID) | ORAL | Status: DC | PRN
  Administered 2020-11-16: 650 mg via ORAL
  Filled 2020-11-14: qty 2

## 2020-11-14 MED ORDER — MONTELUKAST SODIUM 10 MG PO TABS
10.0000 mg | ORAL_TABLET | Freq: Every day | ORAL | Status: DC
  Administered 2020-11-15 – 2020-11-16 (×2): 10 mg via ORAL
  Filled 2020-11-14 (×2): qty 1

## 2020-11-14 MED ORDER — DOCUSATE SODIUM 100 MG PO CAPS
100.0000 mg | ORAL_CAPSULE | Freq: Every day | ORAL | Status: DC | PRN
  Administered 2020-11-15: 100 mg via ORAL
  Filled 2020-11-14: qty 1

## 2020-11-14 MED ORDER — GABAPENTIN 100 MG PO CAPS
100.0000 mg | ORAL_CAPSULE | Freq: Three times a day (TID) | ORAL | Status: DC
  Administered 2020-11-14 – 2020-11-16 (×5): 100 mg via ORAL
  Filled 2020-11-14 (×5): qty 1

## 2020-11-14 MED ORDER — FUROSEMIDE 40 MG PO TABS
40.0000 mg | ORAL_TABLET | Freq: Every day | ORAL | Status: DC
  Administered 2020-11-15: 40 mg via ORAL
  Filled 2020-11-14: qty 1

## 2020-11-14 MED ORDER — SODIUM CHLORIDE 0.9% FLUSH
3.0000 mL | Freq: Two times a day (BID) | INTRAVENOUS | Status: DC
  Administered 2020-11-14 – 2020-11-16 (×4): 3 mL via INTRAVENOUS

## 2020-11-14 MED ORDER — SODIUM CHLORIDE 0.9% FLUSH: 3.0000 mL | INTRAVENOUS | Status: DC | PRN

## 2020-11-14 MED ORDER — ONDANSETRON HCL 4 MG/2ML IJ SOLN: 4.0000 mg | Freq: Four times a day (QID) | INTRAMUSCULAR | Status: DC | PRN

## 2020-11-14 MED ORDER — TESTOSTERONE 20.25 MG/ACT (1.62%) TD GEL: 1.0000 "application " | Freq: Every day | TRANSDERMAL | Status: DC

## 2020-11-14 MED ORDER — IOHEXOL 9 MG/ML PO SOLN
500.0000 mL | ORAL | Status: AC
Start: 2020-11-14 — End: 2020-11-14
  Administered 2020-11-14 (×2): 500 mL via ORAL

## 2020-11-14 MED ORDER — ASPIRIN EC 81 MG PO TBEC
81.0000 mg | DELAYED_RELEASE_TABLET | Freq: Every day | ORAL | Status: DC
  Administered 2020-11-15 – 2020-11-16 (×2): 81 mg via ORAL
  Filled 2020-11-14 (×2): qty 1

## 2020-11-14 MED ORDER — AMLODIPINE BESYLATE 5 MG PO TABS: 5.0000 mg | ORAL_TABLET | Freq: Every day | ORAL | Status: DC

## 2020-11-14 MED ORDER — CINACALCET HCL 30 MG PO TABS
90.0000 mg | ORAL_TABLET | ORAL | Status: DC
  Filled 2020-11-14: qty 3

## 2020-11-14 MED ORDER — TAMSULOSIN HCL 0.4 MG PO CAPS
0.8000 mg | ORAL_CAPSULE | Freq: Every day | ORAL | Status: DC
  Administered 2020-11-15 – 2020-11-16 (×2): 0.8 mg via ORAL
  Filled 2020-11-14 (×2): qty 2

## 2020-11-14 MED ORDER — CLOPIDOGREL BISULFATE 75 MG PO TABS
75.0000 mg | ORAL_TABLET | Freq: Every day | ORAL | Status: DC
  Administered 2020-11-15 – 2020-11-16 (×2): 75 mg via ORAL
  Filled 2020-11-14 (×2): qty 1

## 2020-11-14 MED ORDER — ATORVASTATIN CALCIUM 20 MG PO TABS
20.0000 mg | ORAL_TABLET | Freq: Every day | ORAL | Status: DC
  Administered 2020-11-15 – 2020-11-16 (×2): 20 mg via ORAL
  Filled 2020-11-14 (×2): qty 1

## 2020-11-14 MED ORDER — INSULIN ASPART 100 UNIT/ML IJ SOLN
0.0000 [IU] | Freq: Three times a day (TID) | INTRAMUSCULAR | Status: DC
  Filled 2020-11-14: qty 1

## 2020-11-14 NOTE — ED Notes (Signed)
Report given to dialysis RN at this time.

## 2020-11-14 NOTE — H&P (Addendum)
History and Physical    Donald Fernandez [id number] DOB: 01/08/1971 DOA: 11/14/2020  PCP: Denton Lank, MD   Patient coming from: Home  I have personally briefly reviewed patient's old medical records in [place]  Chief Complaint: Dizziness  HPI: Donald Fernandez is a 50 y.o. male with medical history significant for diabetes mellitus with complications of end-stage renal disease on hemodialysis, morbid obesity, obstructive sleep apnea, hypertension, dyslipidemia who was sent from the outpatient sleep lab to the emergency room for evaluation after he had an episode of 25 beat nonsustained V. Tach. Patient states that he was asleep when the nurse woke him up and told him that his heart had an abnormal rhythm. He had an episode of dizziness 1 day prior to his admission but notes that for the last 2 months he has not felt well and usually has episodes of dizziness mostly post his dialysis sessions.  Per patient he had discussed this with his nephrologist and was told it was probably due to excess fluid being pulled during dialysis. He complains of pain in the right lumbar area which he describes as spasm-like.  He denies having any trauma.  He denies having any hematuria, no frequency, no dysuria or nocturia.  He still makes urine at least once a day. He denies having any chest pain, no shortness of breath, no nausea, no vomiting, no fever, no chills, no cough, no headache, no dizziness, no lightheadedness, no diaphoresis, no palpitations. Labs show sodium 136, potassium 5.0, chloride 95, bicarb 26, glucose 115, BUN 61, creatinine 17, calcium 9.3, magnesium 2.6, alkaline phosphatase 152, albumin 3.9, AST 12, ALT 13, total protein 7.6, BNP 9.5, troponin 24 >> 20, white count 7.7, hemoglobin 13.4, hematocrit 41.0, MCV 99.5, RDW 15.1, platelet count 234 Respiratory viral panel is negative Twelve-lead EKG reviewed by me shows sinus rhythm with right axis deviation.   ED Course: Patient is a  50 year old male with multiple medical problems who presents to the emergency room from the outpatient sleep center after he had an episode of 25 beat nonsustained V. Tach.  He denies having any symptoms but complains of episodes of dizziness 1 day prior to his presentation. He will be referred to observation status for further evaluation.   Review of Systems: As per HPI otherwise all other systems reviewed and negative.    Past Medical History:  Diagnosis Date   Arthritis    Asthma    triggered by enviornmental allergies   BPH (benign prostatic hyperplasia)    Diabetes mellitus without complication ([place])    Dialysis patient ([place])    dialysis T-TH-SAT   GERD (gastroesophageal reflux disease)    Gout    Hyperlipidemia    Hypertension    Neuropathy    Renal disorder    CKD4; end stage renal disease   Restless legs    Sleep apnea    uses cpap    Past Surgical History:  Procedure Laterality Date   A/V FISTULAGRAM N/A 06/08/2017   Procedure: A/V FISTULAGRAM;  Surgeon: Algernon Huxley, MD;  Location: [facility];  Service: Cardiovascular;  Laterality: N/A;   ANKLE SURGERY Right    fracture repair with pins, plates, screws   AV FISTULA INSERTION W/ RF MAGNETIC GUIDANCE Left 09/02/2017   Procedure: AV FISTULA INSERTION W/RF MAGNETIC GUIDANCE;  Surgeon: Katha Cabal, MD;  Location: [facility];  Service: Cardiovascular;  Laterality: Left;   AV FISTULA PLACEMENT Left 08/04/2016   Procedure:  ARTERIOVENOUS (AV) FISTULA CREATION ( RADIOCEPHALIC );  Surgeon: Katha Cabal, MD;  Location: [facility];  Service: Vascular;  Laterality: Left;   DIALYSIS/PERMA CATHETER INSERTION N/A 06/06/2017   Procedure: DIALYSIS/PERMA CATHETER INSERTION;  Surgeon: Algernon Huxley, MD;  Location: [facility];  Service: Cardiovascular;  Laterality: N/A;   DIALYSIS/PERMA CATHETER REMOVAL N/A 05/04/2018   Procedure: DIALYSIS/PERMA CATHETER REMOVAL;  Surgeon: Algernon Huxley, MD;  Location:  [facility];  Service: Cardiovascular;  Laterality: N/A;   IR ANGIO INTRA EXTRACRAN SEL INTERNAL CAROTID UNI R MOD SED  12/17/2016   RADIOLOGY WITH ANESTHESIA N/A 12/17/2016   Procedure: Arteriogram/Venogram with right venous sinus manometry;  Surgeon: Consuella Lose, MD;  Location: [facility];  Service: Radiology;  Laterality: N/A;   REVISON OF ARTERIOVENOUS FISTULA Left 01/04/2018   Procedure: REVISON OF ARTERIOVENOUS FISTULA ( BRACHIALCEPHALIC );  Surgeon: Katha Cabal, MD;  Location: [facility];  Service: Vascular;  Laterality: Left;   UPPER EXTREMITY ANGIOGRAPHY Left 10/19/2017   Procedure: UPPER EXTREMITY ANGIOGRAPHY;  Surgeon: Katha Cabal, MD;  Location: [facility];  Service: Cardiovascular;  Laterality: Left;     reports that he has been smoking cigarettes. He has never used smokeless tobacco. He reports current alcohol use. He reports current drug use. Drug: Marijuana.  Allergies  Allergen Reactions   Accupril [Quinapril Hcl] Anaphylaxis    Angioedema   Ace Inhibitors Anaphylaxis and Other (See Comments)    Angioedema Other reaction(s): Unknown Angioedema Angioedema Other reaction(s): Unknown Angioedema Angioedema Angioedema Angioedema     Family History  Problem Relation Age of Onset   AAA (abdominal aortic aneurysm) Mother    Diabetes Mother    Heart disease Father       Prior to Admission medications   Medication Sig Start Date End Date Taking? Authorizing Provider  amLODipine (NORVASC) 5 MG tablet Take 5 mg by mouth daily.    Yes [provider]  anastrozole (ARIMIDEX) 1 MG tablet TAKE ONE-HALF TABLET BY MOUTH ONCE DAILY 03/11/20  Yes McKenzie, Candee Furbish, MD  aspirin EC 81 MG tablet Take 81 mg by mouth daily.   Yes [provider]  atorvastatin (LIPITOR) 20 MG tablet Take 20 mg by mouth daily.    Yes [provider]  calcium acetate (PHOSLO) 667 MG capsule Take 2,668 mg by mouth 3 (three) times daily with meals.    Yes [provider]  carvedilol (COREG) 3.125 MG tablet Take 3.125 mg by mouth daily. 02/16/18  Yes [provider]  clopidogrel (PLAVIX) 75 MG tablet Take 1 tablet by mouth once daily 09/15/20  Yes Kris Hartmann, NP  docusate sodium (COLACE) 100 MG capsule Take 100 mg by mouth daily as needed for mild constipation.   Yes [provider]  fluticasone (FLONASE) 50 MCG/ACT nasal spray Place 1 spray into both nostrils daily as needed for allergies or rhinitis.   Yes [provider]  furosemide (LASIX) 40 MG tablet Take 40 mg by mouth daily. (Non-dialysis days)   Yes [provider]  montelukast (SINGULAIR) 10 MG tablet Take 10 mg by mouth daily. 12/14/18  Yes [provider]  omeprazole (PRILOSEC) 40 MG capsule Take 40 mg by mouth daily.   Yes [provider]  Semaglutide 14 MG TABS Take 14 mg by mouth daily.   Yes [provider]  tadalafil (CIALIS) 20 MG tablet TAKE 1 TABLET BY MOUTH AS NEEDED 09/26/19  Yes McKenzie, Candee Furbish, MD  Testosterone (ANDROGEL PUMP) 20.25 MG/ACT (1.62%) GEL Place 1 application onto the skin daily. (Apply to shoulders)   Yes [provider]  cloNIDine (CATAPRES) 0.1 MG tablet Take 1 tablet (0.1 mg total) by mouth 3 (three) times daily. Patient not taking: No sig reported 07/11/16   [name], MD  famotidine (PEPCID) 20 MG tablet Take 20 mg by mouth 2 (two) times daily. 10/14/18   [provider]  gabapentin (NEURONTIN) 100 MG capsule Take 1 capsule (100 mg total) by mouth 3 (three) times daily. 07/11/16   [name], MD  tamsulosin (FLOMAX) 0.4 MG CAPS capsule Take 0.8 mg by mouth daily after breakfast.     [provider]    Physical Exam: Vitals:   11/14/20 0700 11/14/20 0730 11/14/20 0800 11/14/20 0830  BP: (!) 132/92 108/86 92/62 (!) 137/105  Pulse: 88 85 85 89  Resp: (!) 25     Temp:      TempSrc:      SpO2: 97% 100% 99% 93%  Weight:      Height:          Vitals:   11/14/20 0700 11/14/20 0730 11/14/20 0800 11/14/20 0830  BP: (!) 132/92 108/86 92/62 (!) 137/105  Pulse: 88 85 85 89  Resp: (!) 25     Temp:      TempSrc:      SpO2: 97% 100% 99% 93%  Weight:      Height:          Constitutional: Alert and oriented x 3 . Not in any apparent distress HEENT:      Head: Normocephalic and atraumatic.         Eyes: PERLA, EOMI, Conjunctivae are normal. Sclera is non-icteric.       Mouth/Throat: Mucous membranes are moist.       Neck: Supple with no signs of meningismus. Cardiovascular: Regular rate and rhythm. No murmurs, gallops, or rubs. 2+ symmetrical distal pulses are present . No JVD. No LE edema Respiratory: Respiratory effort normal .Lungs sounds clear bilaterally. No wheezes, crackles, or rhonchi.  Gastrointestinal: Soft, non tender, and non distended with positive bowel sounds.  Central adiposity Genitourinary: No CVA tenderness. Musculoskeletal: Nontender with normal range of motion in all extremities. No cyanosis, or erythema of extremities. Neurologic:  Face is symmetric. Moving all extremities. No gross focal neurologic deficits . Skin: Skin is warm, dry.  No rash or ulcers Psychiatric: Mood and affect are normal    Labs on Admission: I have personally reviewed following labs and imaging studies  CBC: Recent Labs  Lab 11/14/20 0521  WBC 7.7  NEUTROABS 4.4  HGB 13.4  HCT 41.0  MCV 99.5  PLT [id number]   Basic Metabolic Panel: Recent Labs  Lab 11/14/20 0521  NA 136  K 5.0  CL 95*  CO2 26  GLUCOSE 115*  BUN 61*  CREATININE 17.48*  CALCIUM 9.3  MG 2.6*   GFR: Estimated Creatinine Clearance: 7.2 mL/min (A) (by C-G formula based on SCr of 17.48 mg/dL (H)). Liver Function Tests: Recent Labs  Lab 11/14/20 0521  AST 12*  ALT 13  ALKPHOS 152*  BILITOT 0.7  PROT 7.6  ALBUMIN 3.9   No results for input(s): LIPASE, AMYLASE in the last 168 hours. No results for input(s): AMMONIA in the last 168  hours. Coagulation Profile: No results for input(s): INR, PROTIME in the last 168 hours. Cardiac Enzymes: No results for input(s): CKTOTAL, CKMB, CKMBINDEX, TROPONINI in the last 168 hours. BNP (  last 3 results) No results for input(s): PROBNP in the last 8760 hours. HbA1C: No results for input(s): HGBA1C in the last 72 hours. CBG: No results for input(s): GLUCAP in the last 168 hours. Lipid Profile: No results for input(s): CHOL, HDL, LDLCALC, TRIG, CHOLHDL, LDLDIRECT in the last 72 hours. Thyroid Function Tests: No results for input(s): TSH, T4TOTAL, FREET4, T3FREE, THYROIDAB in the last 72 hours. Anemia Panel: No results for input(s): VITAMINB12, FOLATE, FERRITIN, TIBC, IRON, RETICCTPCT in the last 72 hours. Urine analysis:    Component Value Date/Time   COLORURINE YELLOW (A) 07/09/2016 1215   APPEARANCEUR CLEAR (A) 07/09/2016 1215   LABSPEC 1.009 07/09/2016 1215   PHURINE 6.0 07/09/2016 1215   GLUCOSEU NEGATIVE 07/09/2016 1215   HGBUR NEGATIVE 07/09/2016 1215   BILIRUBINUR NEGATIVE 07/09/2016 1215   KETONESUR NEGATIVE 07/09/2016 1215   PROTEINUR 100 (A) 07/09/2016 1215   NITRITE NEGATIVE 07/09/2016 1215   LEUKOCYTESUR TRACE (A) 07/09/2016 1215    Radiological Exams on Admission: No results found.   Assessment/Plan Principal Problem:   Ventricular tachycardia (paroxysmal) (HCC) Active Problems:   Essential hypertension   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)   OSA (obstructive sleep apnea)   Morbid obesity with BMI of 45.0-49.9, adult (HCC)   CAD (coronary artery disease)   Nicotine dependence    Nonsustained V. Tach Patient was referred to the ER from outpatient sleep center after he had an episode of 25 beats of nonsustained ventricular tachycardia. He is asymptomatic but complained of dizziness earlier in the day prior to his admission. He has a significant history of coronary artery disease as well as hypertension. Will obtain 2D  echocardiogram Check electrolytes Consult cardiology    History of coronary artery disease Continue aspirin, Plavix, statins and beta-blockers    Diabetes mellitus with complications of end-stage renal disease on hemodialysis Dialysis days are M/W/F We will request nephrology consult for renal replacement therapy Place patient on a consistent carbohydrate diet Glycemic control with sliding scale insulin     Morbid obesity with complications of obstructive sleep apnea (BMI [id number] kg/m2) Complicates overall prognosis and care Will place patient on CPAP during this hospitalization     Hypertension Continue amlodipine and carvedilol    Nicotine dependence Smoking cessation has been discussed with patient in detail He declines a nicotine transdermal patch   DVT prophylaxis: Heparin Code Status: full code  Family Communication: Greater than 50% of time was spent discussing patient's condition and plan of care with him at the bedside.  All questions and concerns have been addressed.  He verbalizes understanding and agrees with the plan. Disposition Plan: Back to previous home environment Consults called: Nephrology, cardiology Status: Observation    Kayce Chismar MD [facility]     11/14/2020, 10:04 AM

## 2020-11-14 NOTE — Consult Note (Addendum)
[facility] Consultation Note  Patient ID: Donald Fernandez, MRN: [id number], DOB/AGE: 15-Oct-1970 50 y.o. Admit date: 11/14/2020   Date of Consult: 11/14/2020 Primary Physician: Denton Lank, MD Primary Cardiologist: Dr. Clayborn Bigness  Chief Complaint:  Chief Complaint  Patient presents with   vtach    Pt was doing a sleep study and had a 25 beat run of vtach.    Reason for Consult: HPI     vtach    Additional comments: Pt was doing a sleep study and had a 25 beat run of vtach.       Last edited by Okey Regal, RT on 11/14/2020  4:36 AM.    Ventricular tachycardia during sleep study  HPI: 50 y.o. male with a past medical history of hypertension, ESRD on dialysis, diabetes, hyperlipidemia, OSA who presented to the ED after reported run of ventricular tachycardia during a sleep study.  Patient states that he has seen Dr. Clayborn Bigness at [facility] for an irregular heartbeat in the past however he is unable to remember the name of the irregular heartbeat.  Patient endorses episodes of feeling dizzy, lightheaded occurring most days and not always associated with positional changes.  He endorses feelings of palpitations associated with his episodes of lightheadedness, dizziness.  Patient denies any associated chest pain, shortness of breath with these episodes.  Past Medical History:  Diagnosis Date   Arthritis    Asthma    triggered by enviornmental allergies   BPH (benign prostatic hyperplasia)    Diabetes mellitus without complication ([place])    Dialysis patient ([place])    dialysis T-TH-SAT   GERD (gastroesophageal reflux disease)    Gout    Hyperlipidemia    Hypertension    Neuropathy    Renal disorder    CKD4; end stage renal disease   Restless legs    Sleep apnea    uses cpap      Surgical History:  Past Surgical History:  Procedure Laterality Date   A/V FISTULAGRAM N/A 06/08/2017   Procedure: A/V FISTULAGRAM;  Surgeon: Algernon Huxley, MD;  Location: [place] [facility];  Service: Cardiovascular;  Laterality: N/A;   ANKLE SURGERY Right    fracture repair with pins, plates, screws   AV FISTULA INSERTION W/ RF MAGNETIC GUIDANCE Left 09/02/2017   Procedure: AV FISTULA INSERTION W/RF MAGNETIC GUIDANCE;  Surgeon: Katha Cabal, MD;  Location: [place] [facility];  Service: Cardiovascular;  Laterality: Left;   AV FISTULA PLACEMENT Left 08/04/2016   Procedure: ARTERIOVENOUS (AV) FISTULA CREATION ( RADIOCEPHALIC );  Surgeon: Katha Cabal, MD;  Location: [facility];  Service: Vascular;  Laterality: Left;   DIALYSIS/PERMA CATHETER INSERTION N/A 06/06/2017   Procedure: DIALYSIS/PERMA CATHETER INSERTION;  Surgeon: Algernon Huxley, MD;  Location: [place] [facility];  Service: Cardiovascular;  Laterality: N/A;   DIALYSIS/PERMA CATHETER REMOVAL N/A 05/04/2018   Procedure: DIALYSIS/PERMA CATHETER REMOVAL;  Surgeon: Algernon Huxley, MD;  Location: [place] [facility];  Service: Cardiovascular;  Laterality: N/A;   IR ANGIO INTRA EXTRACRAN SEL INTERNAL CAROTID UNI R MOD SED  12/17/2016   RADIOLOGY WITH ANESTHESIA N/A 12/17/2016   Procedure: Arteriogram/Venogram with right venous sinus manometry;  Surgeon: Consuella Lose, MD;  Location: [place];  Service: Radiology;  Laterality: N/A;   REVISON OF ARTERIOVENOUS FISTULA Left 01/04/2018   Procedure: REVISON OF ARTERIOVENOUS FISTULA ( BRACHIALCEPHALIC );  Surgeon: Katha Cabal, MD;  Location: [facility];  Service: Vascular;  Laterality: Left;  UPPER EXTREMITY ANGIOGRAPHY Left 10/19/2017   Procedure: UPPER EXTREMITY ANGIOGRAPHY;  Surgeon: Katha Cabal, MD;  Location: [place] [facility];  Service: Cardiovascular;  Laterality: Left;     Home Meds: Prior to Admission medications   Medication Sig Start Date End Date Taking? Authorizing Provider  amLODipine (NORVASC) 5 MG tablet Take 5 mg by mouth daily.    Yes [provider]  anastrozole (ARIMIDEX) 1 MG tablet TAKE ONE-HALF TABLET BY MOUTH ONCE DAILY  03/11/20  Yes McKenzie, Candee Furbish, MD  atorvastatin (LIPITOR) 20 MG tablet Take 20 mg by mouth daily.    Yes [provider]  calcium acetate (PHOSLO) 667 MG capsule Take 2,668 mg by mouth 3 (three) times daily with meals.   Yes [provider]  clopidogrel (PLAVIX) 75 MG tablet Take 1 tablet by mouth once daily 09/15/20  Yes Kris Hartmann, NP  fluticasone (FLONASE) 50 MCG/ACT nasal spray Place 1 spray into both nostrils daily as needed for allergies or rhinitis.   Yes [provider]  furosemide (LASIX) 40 MG tablet Take 40 mg by mouth daily. (Non-dialysis days)   Yes [provider]  tadalafil (CIALIS) 20 MG tablet TAKE 1 TABLET BY MOUTH AS NEEDED 09/26/19  Yes McKenzie, Candee Furbish, MD  aspirin EC 81 MG tablet Take 81 mg by mouth daily.    [provider]  carvedilol (COREG) 3.125 MG tablet Take 3.125 mg by mouth daily. 02/16/18   [provider]  cloNIDine (CATAPRES) 0.1 MG tablet Take 1 tablet (0.1 mg total) by mouth 3 (three) times daily. Patient not taking: No sig reported 07/11/16   [name], MD  cyclobenzaprine (FLEXERIL) 5 MG tablet Take 5 mg by mouth 3 (three) times daily.    [provider]  docusate sodium (COLACE) 100 MG capsule Take 100 mg by mouth daily as needed for mild constipation.    [provider]  famotidine (PEPCID) 20 MG tablet Take 20 mg by mouth 2 (two) times daily. 10/14/18   [provider]  gabapentin (NEURONTIN) 100 MG capsule Take 1 capsule (100 mg total) by mouth 3 (three) times daily. 07/11/16   [name], MD  loperamide (IMODIUM) 2 MG capsule Take by mouth.    [provider]  methocarbamol (ROBAXIN) 500 MG tablet Take by mouth. 02/27/19   [provider]  montelukast (SINGULAIR) 10 MG tablet Take 10 mg by mouth daily. 12/14/18   [provider]  omeprazole (PRILOSEC) 20 MG capsule Take 20 mg by mouth daily. 05/12/20   [provider]   oxyCODONE-acetaminophen (PERCOCET) 10-325 MG tablet Take 1 tablet by mouth every 12 (twelve) hours. Patient not taking: Reported on 10/23/2020 05/12/19   [provider]  Semaglutide (RYBELSUS) 3 MG TABS Take 7.5 mg by mouth daily.     [provider]  Semaglutide (RYBELSUS) 3 MG TABS TAKE 1 TABLET BY MOUTH ONCE DAILY FOR DIABETES AND OBESITY 05/26/19   [provider]  sodium bicarbonate 325 MG tablet Take by mouth. 11/30/18   [provider]  Sodium Zirconium Cyclosilicate (LOKELMA PO) Take by mouth. On non dialysis days    [provider]  tadalafil (CIALIS) 20 MG tablet Take 1 tablet by mouth as needed. 06/11/19   [provider]  tamsulosin (FLOMAX) 0.4 MG CAPS capsule Take 0.8 mg by mouth daily after breakfast.     [provider]  Testosterone 1.62 % GEL SMARTSIG:40.5 Milligram(s) Topical Every Morning 10/07/20   [provider]  Testosterone 20.25 MG/1.25GM (1.62%) GEL Place 4 Pump onto the skin daily.    [provider]  topiramate (TOPAMAX) 25 MG capsule Take by mouth.    [provider]  topiramate (TOPAMAX) 25 MG tablet Take 50 mg by mouth at bedtime.    [provider]  traZODone (DESYREL) 50 MG tablet TAKE 1 2 TO 1 (ONE HALF TO ONE) TABLET BY MOUTH NIGHTLY AT BEDTIME FOR SLEEP Patient not taking: No sig reported 06/05/19   [provider]  vitamin E 400 UNIT capsule Take by mouth.    [provider]    Inpatient Medications:   [COMPLETED] iohexol  500 mL Oral Q1 Hr x 2     Allergies:  Allergies  Allergen Reactions   Accupril [Quinapril Hcl] Anaphylaxis    Angioedema   Ace Inhibitors Anaphylaxis and Other (See Comments)    Angioedema Other reaction(s): Unknown Angioedema Angioedema Other reaction(s): Unknown Angioedema Angioedema Angioedema Angioedema     Social History   Socioeconomic History   Marital status: Single    Spouse name: Not on file    Number of children: Not on file   Years of education: Not on file   Highest education level: Not on file  Occupational History   Not on file  Tobacco Use   Smoking status: Every Day    Types: Cigarettes   Smokeless tobacco: Never   Tobacco comments:    APPROX. 1 PK/WEEK   Vaping Use   Vaping Use: Never used  Substance and Sexual Activity   Alcohol use: Yes    Comment: weekends   Drug use: Yes    Types: Marijuana    Comment: LAST TIME APPROX. 08/03/17   Sexual activity: Yes    Birth control/protection: Condom  Other Topics Concern   Not on file  Social History Narrative   Not on file   Social Determinants of Health   Financial Resource Strain: Not on file  Food Insecurity: Not on file  Transportation Needs: Not on file  Physical Activity: Not on file  Stress: Not on file  Social Connections: Not on file  Intimate Partner Violence: Not on file     Family History  Problem Relation Age of Onset   AAA (abdominal aortic aneurysm) Mother    Diabetes Mother    Heart disease Father      Review of Systems Positive for lightheaded, dizzy Negative for: General:  chills, fever, night sweats or weight changes.  Cardiovascular: PND orthopnea syncope dizziness  Dermatological skin lesions rashes Respiratory: Cough congestion Urologic: Frequent urination urination at night and hematuria Abdominal: negative for nausea, vomiting, diarrhea, bright red blood per rectum, melena, or hematemesis Neurologic: negative for visual changes, and/or hearing changes  All other systems reviewed and are otherwise negative except as noted above.  Labs: No results for input(s): CKTOTAL, CKMB, TROPONINI in the last 72 hours. Lab Results  Component Value Date   WBC 7.7 11/14/2020   HGB 13.4 11/14/2020   HCT 41.0 11/14/2020   MCV 99.5 11/14/2020   PLT 234 11/14/2020    Recent Labs  Lab 11/14/20 0521  NA 136  K 5.0  CL 95*  CO2 26  BUN 61*  CREATININE 17.48*  CALCIUM 9.3  PROT 7.6   BILITOT 0.7  ALKPHOS 152*  ALT 13  AST 12*  GLUCOSE 115*   No results found for: CHOL, HDL, LDLCALC, TRIG No results found for: DDIMER  Radiology/Studies:  VAS US DUPLEX DIALYSIS  ACCESS (AVF, AVG)  Result Date: 10/30/2020 DIALYSIS ACCESS Patient Name:  Donald Fernandez  Date of Exam:   10/23/2020 Medical Rec #: [id number]         Accession #:    [id number] Date of Birth: 1970-12-20          Patient Gender: M Patient Age:   36Y Exam Location:  [facility] Procedure:      VAS US DUPLEX DIALYSIS ACCESS (AVF, AVG) Referring Phys: [id number] [place] --------------------------------------------------------------------------------  Reason for Exam: Routine follow up. Access Site: Left Upper Extremity. Access Type: WavLinq. History: 09/02/17: Left ulnar-ulnar AVF creation using radiofrequency with          [place] system & coil embolization of left brachial vein &          radiocephalic AVF;          [id number]: Ulnar artery & anastomosis PTA;          [id number]: Left cephalic vein superficialization;. Comparison Study: 04/2020 Performing Technologist: Concha Norway RVT  Examination Guidelines: A complete evaluation includes B-mode imaging, spectral Doppler, color Doppler, and power Doppler as needed of all accessible portions of each vessel. Unilateral testing is considered an integral part of a complete examination. Limited examinations for reoccurring indications may be performed as noted.  Findings: +--------------------+----------+-----------------+--------+ AVF                 PSV (cm/s)Flow Vol (mL/min)Comments +--------------------+----------+-----------------+--------+ Native artery inflow   104          1366                +--------------------+----------+-----------------+--------+ AVF Anastomosis        144                              +--------------------+----------+-----------------+--------+  +------------+----------+-------------+----------+--------+ OUTFLOW VEINPSV  (cm/s)Diameter (cm)Depth (cm)Describe +------------+----------+-------------+----------+--------+ Confluence     403                                    +------------+----------+-------------+----------+--------+ Shoulder       172                            stent   +------------+----------+-------------+----------+--------+ Prox UA        142                                    +------------+----------+-------------+----------+--------+ Mid UA          57                                    +------------+----------+-------------+----------+--------+ Dist UA         66        1.40                        +------------+----------+-------------+----------+--------+  +---------------+------------+----------+---------+--------+------------------+                  Diameter  Depth (cm)Branching  PSV      Flow Volume                        (  cm)                        (cm/s)      (ml/min)      +---------------+------------+----------+---------+--------+------------------+ Left Rad Art                                     61                      Dis                                                                      +---------------+------------+----------+---------+--------+------------------+ Antegrade                                                                +---------------+------------+----------+---------+--------+------------------+  Summary: Patent left WavLinq AVF with no evidence of significant.  *See table(s) above for measurements and observations.  Diagnosing physician: Hortencia Pilar MD Electronically signed by Hortencia Pilar MD on 10/30/2020 at 5:10:13 PM.   --------------------------------------------------------------------------------   Final     EKG: Normal sinus rhythm at a ventricular rate of 91 bpm  Weights: Filed Weights   11/14/20 0437  Weight: (!) 143 kg     Physical Exam: Blood pressure (!) 137/105, pulse 89,  temperature 98.1 F (36.7 C), temperature source Oral, resp. rate (!) 25, height '5\' 11"'$  (1.803 m), weight (!) 143 kg, SpO2 93 %. Body mass index is 43.97 kg/m. General: Well developed, well nourished, in no acute distress. Head eyes ears nose throat: Normocephalic, atraumatic, sclera non-icteric, no xanthomas, nares are without discharge. No apparent thyromegaly and/or mass  Lungs: Normal respiratory effort.  no wheezes, no rales, no rhonchi.  Heart: RRR with normal S1 S2. no murmur gallop, no rub, PMI is normal size and placement, carotid upstroke normal without bruit, jugular venous pressure is normal Abdomen: Soft, non-tender, non-distended with normoactive bowel sounds. No hepatomegaly. No rebound/guarding. No obvious abdominal masses. Abdominal aorta is normal size without bruit Extremities: No edema. no cyanosis, no clubbing, no ulcers  Peripheral : 2+ bilateral upper extremity pulses, 2+ bilateral femoral pulses, 2+ bilateral dorsal pedal pulse Neuro: Alert and oriented. No facial asymmetry. No focal deficit. Moves all extremities spontaneously. Musculoskeletal: Normal muscle tone without kyphosis Psych:  Responds to questions appropriately with a normal affect.    Assessment: 50 year old male with a history of irregular heartbeat possible atrial fibrillation followed by Dr. Clayborn Bigness.  Patient reported to have a 25 beat run of ventricular tachycardia during his sleep study possibly symptomatic with palpitations, lightheadedness, dizziness.  Plan: 1.  Schedule echocardiogram today to assess the patient's heart structure and function for evidence of left ventricular dysfunction 2.  We will evaluate rhythm strips from sleep study if available  Signed, Jettie Booze [facility] 11/14/2020, 9:08 AM The patient has been interviewed and examined. I agree with assessment and plan above. Serafina Royals MD [facility]

## 2020-11-14 NOTE — Progress Notes (Signed)
[facility]  ROUNDING NOTE   Subjective:   [name] was admitted to [facility] on 11/14/2020 for Ventricular tachycardia (paroxysmal) ([place]) [I47.2]  Last hemodialysis treatment was Wednesday, he completed 3 hours of his treatment.   Patient was doing an overnight sleep study when he started having runs of V-tach. Patient was sent to the ED. Patient was brought for dialysis but was unable to tolerate treatment.   Patient states he has been having dizzy spells and episodes of weakness. Patient had been taken off clonidine earlier last month but symptoms persist.   Objective:  Vital signs in last 24 hours:  Temp:  [98.1 F (36.7 C)-98.6 F (37 C)] 98.2 F (36.8 C) (08/05 1545) Pulse Rate:  [85-105] 88 (08/05 1545) Resp:  [17-25] 17 (08/05 1545) BP: (66-149)/(18-105) 87/59 (08/05 1545) SpO2:  [93 %-100 %] 98 % (08/05 1545) Weight:  [143 kg] 143 kg (08/05 0437)  Weight change:  Filed Weights   11/14/20 0437  Weight: (!) 143 kg    Intake/Output: No intake/output data recorded.   Intake/Output this shift:  Total I/O In: 100 [IV Piggyback:100] Out: 802 [Other:802]  Physical Exam: General: NAD,   Head: Normocephalic, atraumatic. Moist oral mucosal membranes  Eyes: Anicteric, PERRL  Neck: Supple, trachea midline  Lungs:  Clear to auscultation  Heart: Regular rate and rhythm  Abdomen:  Soft, nontender,   Extremities:  No peripheral edema.  Neurologic: Nonfocal, moving all four extremities  Skin: No lesions  Access: Left AVF    Basic Metabolic Panel: Recent Labs  Lab 11/14/20 0521 11/14/20 1440  NA 136  --   K 5.0  --   CL 95*  --   CO2 26  --   GLUCOSE 115*  --   BUN 61*  --   CREATININE 17.48* 14.54*  CALCIUM 9.3  --   MG 2.6*  --     Liver Function Tests: Recent Labs  Lab 11/14/20 0521  AST 12*  ALT 13  ALKPHOS 152*  BILITOT 0.7  PROT 7.6  ALBUMIN 3.9   No results for input(s): LIPASE, AMYLASE in the last 168 hours. No results  for input(s): AMMONIA in the last 168 hours.  CBC: Recent Labs  Lab 11/14/20 0521  WBC 7.7  NEUTROABS 4.4  HGB 13.4  HCT 41.0  MCV 99.5  PLT 234    Cardiac Enzymes: No results for input(s): CKTOTAL, CKMB, CKMBINDEX, TROPONINI in the last 168 hours.  BNP: Invalid input(s): POCBNP  CBG: No results for input(s): GLUCAP in the last 168 hours.  Microbiology: Results for orders placed or performed during the hospital encounter of 11/14/20  Resp Panel by RT-PCR (Flu A&B, Covid) Nasopharyngeal Swab     Status: None   Collection Time: 11/14/20  6:47 AM   Specimen: Nasopharyngeal Swab; Nasopharyngeal(NP) swabs in vial transport medium  Result Value Ref Range Status   SARS Coronavirus 2 by RT PCR NEGATIVE NEGATIVE Final    Comment: (NOTE) SARS-CoV-2 target nucleic acids are NOT DETECTED.  The SARS-CoV-2 RNA is generally detectable in upper respiratory specimens during the acute phase of infection. The lowest concentration of SARS-CoV-2 viral copies this assay can detect is 138 copies/mL. A negative result does not preclude SARS-Cov-2 infection and should not be used as the sole basis for treatment or other patient management decisions. A negative result may occur with  improper specimen collection/handling, submission of specimen other than nasopharyngeal swab, presence of viral mutation(s) within the areas targeted by  this assay, and inadequate number of viral copies(<138 copies/mL). A negative result must be combined with clinical observations, patient history, and epidemiological information. The expected result is Negative.  Fact Sheet for Patients:  EntrepreneurPulse.com.au  Fact Sheet for Healthcare Providers:  IncredibleEmployment.be  This test is no t yet approved or cleared by the [place] FDA and  has been authorized for detection and/or diagnosis of SARS-CoV-2 by FDA under an Emergency Use Authorization (EUA). This EUA will  remain  in effect (meaning this test can be used) for the duration of the COVID-19 declaration under Section 564(b)(1) of the Act, 21 U.S.C.section 360bbb-3(b)(1), unless the authorization is terminated  or revoked sooner.       Influenza A by PCR NEGATIVE NEGATIVE Final   Influenza B by PCR NEGATIVE NEGATIVE Final    Comment: (NOTE) The Xpert Xpress SARS-CoV-2/FLU/RSV plus assay is intended as an aid in the diagnosis of influenza from Nasopharyngeal swab specimens and should not be used as a sole basis for treatment. Nasal washings and aspirates are unacceptable for Xpert Xpress SARS-CoV-2/FLU/RSV testing.  Fact Sheet for Patients: EntrepreneurPulse.com.au  Fact Sheet for Healthcare Providers: IncredibleEmployment.be  This test is not yet approved or cleared by the [place] FDA and has been authorized for detection and/or diagnosis of SARS-CoV-2 by FDA under an Emergency Use Authorization (EUA). This EUA will remain in effect (meaning this test can be used) for the duration of the COVID-19 declaration under Section 564(b)(1) of the Act, 21 U.S.C. section 360bbb-3(b)(1), unless the authorization is terminated or revoked.  Performed at [facility], [address] [place], [place] [address]     Coagulation Studies: No results for input(s): LABPROT, INR in the last 72 hours.  Urinalysis: No results for input(s): COLORURINE, LABSPEC, PHURINE, GLUCOSEU, HGBUR, BILIRUBINUR, KETONESUR, PROTEINUR, UROBILINOGEN, NITRITE, LEUKOCYTESUR in the last 72 hours.  Invalid input(s): APPERANCEUR    Imaging: CT ABDOMEN PELVIS WO CONTRAST  Result Date: 11/14/2020 CLINICAL DATA:  Left lower quadrant pain EXAM: CT ABDOMEN AND PELVIS WITHOUT CONTRAST TECHNIQUE: Multidetector CT imaging of the abdomen and pelvis was performed following the standard protocol without IV contrast. COMPARISON:  CT 07/09/2016 FINDINGS: Lower chest: Lung bases  demonstrate atelectasis at the right base. No acute consolidation or effusion. Coronary vascular calcification. Hepatobiliary: No focal liver abnormality is seen. No gallstones, gallbladder wall thickening, or biliary dilatation. Pancreas: Unremarkable. No pancreatic ductal dilatation or surrounding inflammatory changes. Spleen: Normal in size without focal abnormality. Adrenals/Urinary Tract: Adrenal glands are normal. Kidneys are atrophic. No hydronephrosis. Probable cyst upper pole left kidney. 2.3 cm slightly hyperdense lesion mid left kidney. The bladder is unremarkable Stomach/Bowel: Stomach is within normal limits. Appendix appears normal. No evidence of bowel wall thickening, distention, or inflammatory changes. Vascular/Lymphatic: Moderate aortic atherosclerosis. No aneurysm. No suspicious nodes. Reproductive: Prostate is unremarkable. Other: Negative for free air or free fluid Musculoskeletal: No acute osseous abnormality. Progression of bilateral SI joint arthropathy with sclerosis and lucency. Irregular endplate changes at S1, series 6, image 93, increased compared to prior. Facet degenerative changes at multiple levels. IMPRESSION: 1. No CT evidence for acute intra-abdominopelvic abnormality. 2. Atrophic kidneys. 2.3 cm hyperdense lesion within the mid left kidney, indeterminate. Consider correlation with ultrasound 3. Progression of bilateral SI joint arthropathy. Irregular endplate changes superiorly at S1. No corresponding paravertebral soft tissue abnormality. Correlate for any signs or symptoms of infection or back pain with follow-up MRI if indicated. Electronically Signed   By: Donavan Foil M.D.   On: 11/14/2020  15:30     Medications:    sodium chloride      amLODipine  5 mg Oral Daily   anastrozole  0.5 mg Oral Daily   aspirin EC  81 mg Oral Daily   atorvastatin  20 mg Oral Daily   calcium acetate  2,668 mg Oral TID WC   carvedilol  3.125 mg Oral Daily   clopidogrel  75 mg Oral  Daily   furosemide  40 mg Oral Daily   gabapentin  100 mg Oral TID   heparin  5,000 Units Subcutaneous Q8H   insulin aspart  0-6 Units Subcutaneous TID WC   montelukast  10 mg Oral Daily   pantoprazole  40 mg Oral Daily   sodium chloride flush  3 mL Intravenous Q12H   sodium chloride flush  3 mL Intravenous Q12H   tamsulosin  0.8 mg Oral QPC breakfast   Testosterone  1 application Transdermal Daily   sodium chloride, acetaminophen **OR** acetaminophen, docusate sodium, fluticasone, ondansetron **OR** ondansetron (ZOFRAN) IV, sodium chloride flush  Assessment/ Plan:  [name] is a 50 y.o. Black male with end stage renal disease on hemodialysis, hypertension, hyperlipidemia, BPH, diabetes mellitus type II, GERd, gout, sleep apnea who is admitted to [facility] on 11/14/2020 for Ventricular tachycardia (paroxysmal) ([place]) [I47.2]  [facility] Left AVF 143kg  End Stage Renal Disease: patient unable to tolerate treatment today. Patient with hypotension and moving all around. Will plan on dialysis tomorrow in a chair.  Hypotension: with history of hypertension. New onset V-tach. Appreciate cardiology input. May need to hold blood pressure agents if remains low: amlodipine, furosemide, carvedilol and tamsulosin.  Anemia with chronic kidney disease: Hemoglobin 13.4 - at goal. ESA at outpatient.  Secondary Hyperparathyroidism: Continue calcium acetate with meals and cinacalcet three times a week.    LOS: 0 Chaquita Basques 8/5/20225:12 PM

## 2020-11-14 NOTE — Progress Notes (Signed)
Spoke with Dewayne in central telelmetry at [phone] to transfer pt from ED 9 to [facility].

## 2020-11-14 NOTE — ED Provider Notes (Signed)
[facility] Emergency Department Provider Note  ____________________________________________   Event Date/Time   First MD Initiated Contact with Patient 11/14/20 [phone]     (approximate)  I have reviewed the triage vital signs and the nursing notes.   HISTORY  Chief Complaint vtach (Pt was doing a sleep study and had a 25 beat run of vtach. )    HPI Donald Fernandez is a 49 y.o. male with history of diabetes, incisional disease, hypertension, hyperlipidemia, here with episode of V. tach.  The patient was getting an outpatient sleep study test this morning.  He reportedly was feeling fine.  He developed a 25 beat run of V. tach during stage II sleep.  He was awoken and felt fine.  He does state he had an episode of lightheadedness and palpitations yesterday, but this resolved spontaneously.  He now feels back to his baseline.  Denies history of arrhythmia.  He has end-stage renal disease and is due for today but has been going to his sessions as he should.  Denies any recent medication changes.  No chest pain.  No shortness of breath.  No other complaints.    Past Medical History:  Diagnosis Date   Arthritis    Asthma    triggered by enviornmental allergies   BPH (benign prostatic hyperplasia)    Diabetes mellitus without complication ([place])    Dialysis patient ([place])    dialysis T-TH-SAT   GERD (gastroesophageal reflux disease)    Gout    Hyperlipidemia    Hypertension    Neuropathy    Renal disorder    CKD4; end stage renal disease   Restless legs    Sleep apnea    uses cpap    Patient Active Problem List   Diagnosis Date Noted   Ventricular tachycardia (paroxysmal) ([place]) 11/14/2020   Morbid obesity with BMI of 45.0-49.9, adult ([place]) 06/21/2019   Lumbar facet hypertrophy (Multilevel) (Bilateral) 03/26/2019   Lumbar foraminal stenosis (Multilevel) (Bilateral) 03/26/2019   DDD (degenerative disc disease), lumbosacral 03/26/2019   Sacroiliitis  (Bilateral) 03/26/2019   Osteoarthritis of lumbar spine 03/26/2019   Osteoarthritis of facet joint of lumbar spine 03/26/2019   Chronic pain syndrome 03/26/2019   Pharmacologic therapy 03/26/2019   Disorder of skeletal system 03/26/2019   Problems influencing health status 03/26/2019   Chronic gout 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 tophus 03/26/2019   Chronic kidney disease (CKD) Stage 5, on chronic dialysis ([place]) 03/26/2019   Chronic low back pain (Primary Area of Pain) (Bilateral) (L>R) w/o sciatica 03/26/2019   Lumbar facet syndrome (Bilateral) (L>R) 03/26/2019   Chronic lower extremity pain (Bilateral) (L>R) 03/26/2019   Spondylosis without myelopathy or radiculopathy, lumbosacral region 03/26/2019   Chronic anticoagulation (Plavix) 03/26/2019   Anemia 03/15/2019   Occlusion and stenosis of basilar artery 08/24/2018   ESRD on hemodialysis ([place]) 06/07/2018   Gastroesophageal reflux disease without esophagitis 06/07/2018   Personal history of gout 06/07/2018   Small vessel disease, cerebrovascular [id number]   Complication of vascular access for dialysis 10/31/2017   ESRD on dialysis [facility]) 07/27/2017   Angioedema 06/05/2017   Pseudotumor cerebri 12/17/2016   Essential hypertension 08/02/2016   Diabetes mellitus type 2 in obese ([place]) 08/02/2016   Papilledema 07/30/2016   Severe uncontrolled hypertension 07/30/2016   Acute on chronic renal failure ([place]) 07/09/2016   OSA (obstructive sleep apnea) 11/27/2014    Past Surgical History:  Procedure Laterality Date   A/V FISTULAGRAM N/A 06/08/2017   Procedure: A/V FISTULAGRAM;  Surgeon: Algernon Huxley, MD;  Location: [place] [facility];  Service: Cardiovascular;  Laterality: N/A;   ANKLE SURGERY Right    fracture repair with pins, plates, screws   AV FISTULA INSERTION W/ RF MAGNETIC GUIDANCE Left 09/02/2017   Procedure: AV FISTULA INSERTION W/RF MAGNETIC GUIDANCE;  Surgeon: Katha Cabal, MD;  Location: [place] [facility];   Service: Cardiovascular;  Laterality: Left;   AV FISTULA PLACEMENT Left 08/04/2016   Procedure: ARTERIOVENOUS (AV) FISTULA CREATION ( RADIOCEPHALIC );  Surgeon: Katha Cabal, MD;  Location: [facility] OR[facility];  Service: Vascular;  Laterality: Left;   DIALYSIS/PERMA CATHETER INSERTION N/A 06/06/2017   Procedure: DIALYSIS/PERMA CATHETER INSERTION;  Surgeon: Algernon Huxley, MD;  Location: [place] [facility];  Service: Cardiovascular;  Laterality: N/A;   DIALYSIS/PERMA CATHETER REMOVAL N/A 05/04/2018   Procedure: DIALYSIS/PERMA CATHETER REMOVAL;  Surgeon: Algernon Huxley, MD;  Location: [place] [facility];  Service: Cardiovascular;  Laterality: N/A;   IR ANGIO INTRA EXTRACRAN SEL INTERNAL CAROTID UNI R MOD SED  12/17/2016   RADIOLOGY WITH ANESTHESIA N/A 12/17/2016   Procedure: Arteriogram/Venogram with right venous sinus manometry;  Surgeon: Consuella Lose, MD;  Location: [place];  Service: Radiology;  Laterality: N/A;   REVISON OF ARTERIOVENOUS FISTULA Left 01/04/2018   Procedure: REVISON OF ARTERIOVENOUS FISTULA ( BRACHIALCEPHALIC );  Surgeon: Katha Cabal, MD;  Location: [facility];  Service: Vascular;  Laterality: Left;   UPPER EXTREMITY ANGIOGRAPHY Left 10/19/2017   Procedure: UPPER EXTREMITY ANGIOGRAPHY;  Surgeon: Katha Cabal, MD;  Location: [place] [facility];  Service: Cardiovascular;  Laterality: Left;    Prior to Admission medications   Medication Sig Start Date End Date Taking? Authorizing Provider  albuterol (VENTOLIN HFA) 108 (90 Base) MCG/ACT inhaler INHALE 1 PUFF BY MOUTH 4 TIMES DAILY AS NEEDED FOR WHEEZING 12/07/19   [provider]  amLODipine (NORVASC) 5 MG tablet Take 5 mg by mouth daily.     [provider]  anastrozole (ARIMIDEX) 1 MG tablet TAKE ONE-HALF TABLET BY MOUTH ONCE DAILY 03/11/20   McKenzie, Candee Furbish, MD  aspirin EC 81 MG tablet Take 81 mg by mouth daily.    [provider]  atorvastatin (LIPITOR) 20 MG tablet Take 20 mg by mouth daily.      [provider]  azelastine (ASTELIN) 0.1 % nasal spray USE 2 SPRAY(S) IN EACH NOSTRIL TWICE DAILY Patient not taking: No sig reported 04/20/18   [provider]  calcium acetate (PHOSLO) 667 MG capsule TAKE 2 CAPSULES BY MOUTH WITH MEALS AND 1 WITH SNACKS 01/26/18   [provider]  carvedilol (COREG) 3.125 MG tablet Take 3.125 mg by mouth daily. 02/16/18   [provider]  Cholecalciferol 1000 units tablet Take 1,000 Units by mouth daily.  Patient not taking: Reported on 10/23/2020    [provider]  clomiPHENE (CLOMID) 50 MG tablet SMARTSIG:.5 Tablet(s) By Mouth Daily Patient not taking: No sig reported 02/11/19   [provider]  cloNIDine (CATAPRES) 0.1 MG tablet Take 1 tablet (0.1 mg total) by mouth 3 (three) times daily. Patient not taking: Reported on 10/23/2020 07/11/16   [name], MD  clopidogrel (PLAVIX) 75 MG tablet Take 1 tablet by mouth once daily 09/15/20   Kris Hartmann, NP  cyclobenzaprine (FLEXERIL) 5 MG tablet Take 5 mg by mouth 3 (three) times daily.    [provider]  docusate sodium (COLACE) 100 MG capsule Take 100 mg by mouth daily as needed  for mild constipation.    [provider]  famotidine (PEPCID) 20 MG tablet Take 20 mg by mouth 2 (two) times daily. 10/14/18   [provider]  fluticasone (FLONASE) 50 MCG/ACT nasal spray Place 1 spray into both nostrils daily as needed for allergies or rhinitis.    [provider]  furosemide (LASIX) 40 MG tablet Take 40 mg by mouth. Takes on Tues, Thur, Sat, Sun    [provider]  gabapentin (NEURONTIN) 100 MG capsule Take 1 capsule (100 mg total) by mouth 3 (three) times daily. 07/11/16   [name], MD  loperamide (IMODIUM) 2 MG capsule Take by mouth.    [provider]  methocarbamol (ROBAXIN) 500 MG tablet Take by mouth. 02/27/19   [provider]  montelukast (SINGULAIR) 10 MG tablet Take 10 mg by mouth  daily. 12/14/18   [provider]  omeprazole (PRILOSEC) 20 MG capsule Take 20 mg by mouth daily. 05/12/20   [provider]  oxyCODONE-acetaminophen (PERCOCET) 10-325 MG tablet Take 1 tablet by mouth every 12 (twelve) hours. Patient not taking: Reported on 10/23/2020 05/12/19   [provider]  predniSONE (DELTASONE) 50 MG tablet Take one pill once per day for the next 4 days, starting on 8/29, Sunday. Patient not taking: No sig reported 12/08/19   Vladimir Crofts, MD  Semaglutide (RYBELSUS) 3 MG TABS Take 7.5 mg by mouth daily.     [provider]  Semaglutide (RYBELSUS) 3 MG TABS TAKE 1 TABLET BY MOUTH ONCE DAILY FOR DIABETES AND OBESITY 05/26/19   [provider]  sodium bicarbonate 325 MG tablet Take by mouth. 11/30/18   [provider]  Sodium Zirconium Cyclosilicate (LOKELMA PO) Take by mouth. On non dialysis days    [provider]  tadalafil (CIALIS) 20 MG tablet TAKE 1 TABLET BY MOUTH AS NEEDED 09/26/19   McKenzie, Candee Furbish, MD  tadalafil (CIALIS) 20 MG tablet Take 1 tablet by mouth as needed. 06/11/19   [provider]  tamsulosin (FLOMAX) 0.4 MG CAPS capsule Take 0.8 mg by mouth daily after breakfast.     [provider]  Testosterone 1.62 % GEL SMARTSIG:40.5 Milligram(s) Topical Every Morning 10/07/20   [provider]  Testosterone 20.25 MG/1.25GM (1.62%) GEL Place 4 Pump onto the skin daily.    [provider]  topiramate (TOPAMAX) 25 MG capsule Take by mouth.    [provider]  topiramate (TOPAMAX) 25 MG tablet Take 50 mg by mouth at bedtime.    [provider]  traZODone (DESYREL) 50 MG tablet TAKE 1 2 TO 1 (ONE HALF TO ONE) TABLET BY MOUTH NIGHTLY AT BEDTIME FOR SLEEP Patient not taking: No sig reported 06/05/19   [provider]  vitamin E 400 UNIT capsule Take by mouth.    [provider]    Allergies Accupril [quinapril hcl] and Ace inhibitors  Family  History  Problem Relation Age of Onset   AAA (abdominal aortic aneurysm) Mother    Diabetes Mother    Heart disease Father     Social History Social History   Tobacco Use   Smoking status: Every Day    Types: Cigarettes   Smokeless tobacco: Never   Tobacco comments:    APPROX. 1 PK/WEEK   Vaping Use   Vaping Use: Never used  Substance Use Topics   Alcohol use: Yes    Comment: weekends   Drug use: Yes    Types: Marijuana    Comment:  LAST TIME APPROX. 08/03/17    Review of Systems  Review of Systems  Constitutional:  Negative for chills, fatigue and fever.  HENT:  Negative for sore throat.   Respiratory:  Negative for shortness of breath.   Cardiovascular:  Negative for chest pain.  Gastrointestinal:  Negative for abdominal pain.  Genitourinary:  Negative for flank pain.  Musculoskeletal:  Negative for neck pain.  Skin:  Negative for rash and wound.  Allergic/Immunologic: Negative for immunocompromised state.  Neurological:  Negative for weakness and numbness.  Hematological:  Does not bruise/bleed easily.    ____________________________________________  PHYSICAL EXAM:      VITAL SIGNS: ED Triage Vitals  Enc Vitals Group     BP 11/14/20 0436 (!) 149/92     Pulse Rate 11/14/20 0436 91     Resp 11/14/20 0436 18     Temp 11/14/20 0436 98.1 F (36.7 C)     Temp Source 11/14/20 0436 Oral     SpO2 11/14/20 0436 100 %     Weight 11/14/20 0437 (!) 315 lb 4.1 oz (143 kg)     Height 11/14/20 0437 '5\' 11"'$  (1.803 m)     Head Circumference --      Peak Flow --      Pain Score --      Pain Loc --      Pain Edu? --      Excl. in [place]? --      Physical Exam Vitals and nursing note reviewed.  Constitutional:      General: He is not in acute distress.    Appearance: He is well-developed.  HENT:     Head: Normocephalic and atraumatic.  Eyes:     Conjunctiva/sclera: Conjunctivae normal.  Cardiovascular:     Rate and Rhythm: Normal rate and regular rhythm.     Heart  sounds: Normal heart sounds. No murmur heard.   No friction rub.  Pulmonary:     Effort: Pulmonary effort is normal. No respiratory distress.     Breath sounds: Normal breath sounds. No wheezing or rales.  Abdominal:     General: There is no distension.     Palpations: Abdomen is soft.     Tenderness: There is no abdominal tenderness.  Musculoskeletal:     Cervical back: Neck supple.  Skin:    General: Skin is warm.     Capillary Refill: Capillary refill takes less than 2 seconds.  Neurological:     Mental Status: He is alert and oriented to person, place, and time.     Motor: No abnormal muscle tone.      ____________________________________________   LABS (all labs ordered are listed, but only abnormal results are displayed)  Labs Reviewed  CBC WITH DIFFERENTIAL/PLATELET - Abnormal; Notable for the following components:      Result Value   RBC 4.12 (*)    nRBC 0.3 (*)    All other components within normal limits  COMPREHENSIVE METABOLIC PANEL - Abnormal; Notable for the following components:   Chloride 95 (*)    Glucose, Bld 115 (*)    BUN 61 (*)    Creatinine, Ser 17.48 (*)    AST 12 (*)    Alkaline Phosphatase 152 (*)    GFR, Estimated 3 (*)    All other components within normal limits  MAGNESIUM - Abnormal; Notable for the following components:   Magnesium 2.6 (*)    All other components within normal limits  TROPONIN I (HIGH SENSITIVITY) -  Abnormal; Notable for the following components:   Troponin I (High Sensitivity) 24 (*)    All other components within normal limits  TROPONIN I (HIGH SENSITIVITY) - Abnormal; Notable for the following components:   Troponin I (High Sensitivity) 20 (*)    All other components within normal limits  RESP PANEL BY RT-PCR (FLU A&B, COVID) ARPGX2  BRAIN NATRIURETIC PEPTIDE  HEPATITIS B SURFACE ANTIGEN  HEPATITIS B SURFACE ANTIBODY,QUALITATIVE    ____________________________________________  EKG: Normal sinus rhythm,  ventricular rate 91.  PR 166, QRS 88, QTc 411.  Low voltage.  Nonspecific T wave inversions in the inferolateral leads.  No acute ST elevations. ________________________________________  RADIOLOGY All imaging, including plain films, CT scans, and ultrasounds, independently reviewed by me, and interpretations confirmed via formal radiology reads.  ED MD interpretation:     Official radiology report(s): No results found.  ____________________________________________  PROCEDURES   Procedure(s) performed (including Critical Care):  Procedures  ____________________________________________  INITIAL IMPRESSION / MDM / [place] / ED COURSE  As part of my medical decision making, I reviewed the following data within the [place] notes reviewed and incorporated, Old chart reviewed, Notes from prior ED visits, and [place] Controlled Substance Database       *Donald Fernandez was evaluated in Emergency Department on 11/14/2020 for the symptoms described in the history of present illness. He was evaluated in the context of the global COVID-19 pandemic, which necessitated consideration that the patient might be at risk for infection with the SARS-CoV-2 virus that causes COVID-19. Institutional protocols and algorithms that pertain to the evaluation of patients at risk for COVID-19 are in a state of rapid change based on information released by regulatory bodies including the CDC and federal and state organizations. These policies and algorithms were followed during the patient's care in the ED.  Some ED evaluations and interventions may be delayed as a result of limited staffing during the pandemic.*     Medical Decision Making: 50 yo M here with episode of VTach on monitoring during sleep study this morning. Pt is well appearing, HDS here in ED. EKG nonischemic. No significant ectopy noted on tele. Trop mildly elevated but at baseline. Lytes acceptable for his ESRD  status. Reviewed recordings from OSA test which do show a run of 20+ ventricular, wide-complex beats. D/w Dr. Nehemiah Massed of Cardiology. Pt denies cardiac history and has not had a recent echo. Will admit for TTE, tele, obs.   ____________________________________________  FINAL CLINICAL IMPRESSION(S) / ED DIAGNOSES  Final diagnoses:  Ventricular tachycardia ([place])     MEDICATIONS GIVEN DURING THIS VISIT:  Medications  iohexol (OMNIPAQUE) 9 MG/ML oral solution 500 mL (has no administration in time range)     ED Discharge Orders     None        Note:  This document was prepared using Dragon voice recognition software and may include unintentional dictation errors.   Duffy Bruce, MD 11/14/20 [phone]

## 2020-11-14 NOTE — Progress Notes (Signed)
Albumin given by RN due to low BP doctor is aware.

## 2020-11-14 NOTE — ED Notes (Signed)
Pt arrived back from dialysis , dialysis stated pt requested to stop dialysis

## 2020-11-14 NOTE — ED Notes (Signed)
Pt refuses to stay on gurney stated he wants to sit in chair or stand

## 2020-11-14 NOTE — Progress Notes (Signed)
*  PRELIMINARY RESULTS* Echocardiogram 2D Echocardiogram has been performed.  Sherrie Sport 11/14/2020, 2:21 PM

## 2020-11-14 NOTE — Progress Notes (Signed)
UF off due to low BP per doctor, pt c\o being uncomfortable in stretcher, he is constantly moving and putting pressure on accessed arm which was explained that he could easily infiltrate .

## 2020-11-14 NOTE — Progress Notes (Signed)
Pt pre HD RN assessment complete.

## 2020-11-14 NOTE — Progress Notes (Signed)
Patient constantly c\o pain on his bottom, wanting to get out of stretcher and would not remain stil. Due to his constant moving we was unable to get a stable BP and his venus needle in his access moved causing his VP to go up to 300+. I tried to adjust venus needle multiple times and was not successful, his VP continued to remain 300+. Pt was rinsed back at his request with NS and put in transport, RN and doctor is aware. Pt will be scheduled for next day to receive his tx in a chair.

## 2020-11-14 NOTE — Progress Notes (Signed)
Hemodialysis patient known at [place] MWF 6:40am. Patient rides with [facility], discussed some dialysis concerns related to center and access. Discussed and educated on fluid restrictions. Please contact me with any dialysis placement concerns.  Elvera Bicker Dialysis Coordinator [phone]

## 2020-11-15 DIAGNOSIS — I472 Ventricular tachycardia: Secondary | ICD-10-CM | POA: Diagnosis not present

## 2020-11-15 LAB — BASIC METABOLIC PANEL
Anion gap: 17 — ABNORMAL HIGH (ref 5–15)
BUN: 61 mg/dL — ABNORMAL HIGH (ref 6–20)
CO2: 25 mmol/L (ref 22–32)
Calcium: 9 mg/dL (ref 8.9–10.3)
Chloride: 92 mmol/L — ABNORMAL LOW (ref 98–111)
Creatinine, Ser: 17.94 mg/dL — ABNORMAL HIGH (ref 0.61–1.24)
GFR, Estimated: 3 mL/min — ABNORMAL LOW (ref >60)
Glucose, Bld: 122 mg/dL — ABNORMAL HIGH (ref 70–99)
Potassium: 5.5 mmol/L — ABNORMAL HIGH (ref 3.5–5.1)
Sodium: 134 mmol/L — ABNORMAL LOW (ref 135–145)

## 2020-11-15 LAB — CBC
HCT: 39.1 % (ref 39.0–52.0)
Hemoglobin: 12.7 g/dL — ABNORMAL LOW (ref 13.0–17.0)
MCH: 31.6 pg (ref 26.0–34.0)
MCHC: 32.5 g/dL (ref 30.0–36.0)
MCV: 97.3 fL (ref 80.0–100.0)
Platelets: 225 10*3/uL (ref 150–400)
RBC: 4.02 MIL/uL — ABNORMAL LOW (ref 4.22–5.81)
RDW: 14.8 % (ref 11.5–15.5)
WBC: 6.2 10*3/uL (ref 4.0–10.5)
nRBC: 0 % (ref 0.0–0.2)

## 2020-11-15 LAB — GLUCOSE, CAPILLARY
Glucose-Capillary: 155 mg/dL — ABNORMAL HIGH (ref 70–99)
Glucose-Capillary: 120 mg/dL — ABNORMAL HIGH (ref 70–99)
Glucose-Capillary: 147 mg/dL — ABNORMAL HIGH (ref 70–99)
Glucose-Capillary: 152 mg/dL — ABNORMAL HIGH (ref 70–99)

## 2020-11-15 MED ORDER — METOPROLOL SUCCINATE ER 50 MG PO TB24
50.0000 mg | ORAL_TABLET | Freq: Every day | ORAL | Status: DC
  Administered 2020-11-15: 50 mg via ORAL
  Filled 2020-11-15: qty 1

## 2020-11-15 MED ORDER — CHLORHEXIDINE GLUCONATE CLOTH 2 % EX PADS
6.0000 | MEDICATED_PAD | Freq: Every day | CUTANEOUS | Status: DC
  Administered 2020-11-16: 6 via TOPICAL

## 2020-11-15 NOTE — Progress Notes (Signed)
[facility] Encounter Note  Patient: Donald Fernandez / Admit Date: 11/14/2020 / Date of Encounter: 11/15/2020, 7:06 AM   Subjective: Patient is feeling well today and has not had any episodes of irregularity of the heartbeat or tachycardia since admission.  Patient tolerated dialysis without difficulty.  Patient has multiple different types of issues and concerns including dizziness weakness and hypotension after dialysis, irregular heartbeat and runs of irregular heartbeat.  and weakness.  The patient did have an evaluation of sleep study with long run of wide-complex tachycardia of unknown etiology but nonsustained.  Echocardiogram showing normal LV systolic function ejection fraction of 70% and no evidence of significant valvular heart disease  Review of Systems: Positive for: Shortness of breath weakness Negative for: Vision change, hearing change, syncope, dizziness, nausea, vomiting,diarrhea, bloody stool, stomach pain, cough, congestion, diaphoresis, urinary frequency, urinary pain,skin lesions, skin rashes Others previously listed  Objective: Telemetry: Normal sinus rhythm Physical Exam: Blood pressure (!) 98/52, pulse 84, temperature 98.3 F (36.8 C), resp. rate 18, height '5\' 11"'$  (1.803 m), weight (!) 143 kg, SpO2 98 %. Body mass index is 43.97 kg/m. General: Well developed, well nourished, in no acute distress. Head: Normocephalic, atraumatic, sclera non-icteric, no xanthomas, nares are without discharge. Neck: No apparent masses Lungs: Normal respirations with no wheezes, no rhonchi, no rales , no crackles   Heart: Regular rate and rhythm, normal S1 S2, no murmur, no rub, no gallop, PMI is normal size and placement, carotid upstroke normal without bruit, jugular venous pressure normal Abdomen: Soft, non-tender, non-distended with normoactive bowel sounds. No hepatosplenomegaly. Abdominal aorta is normal size without bruit Extremities: No edema, no clubbing,  no cyanosis, no ulcers,  Peripheral: 2+ radial, 2+ femoral, 2+ dorsal pedal pulses Neuro: Alert and oriented. Moves all extremities spontaneously. Psych:  Responds to questions appropriately with a normal affect.   Intake/Output Summary (Last 24 hours) at 11/15/2020 0706 Last data filed at 11/14/2020 1237 Gross per 24 hour  Intake 100 ml  Output 802 ml  Net -702 ml    Inpatient Medications:   anastrozole  0.5 mg Oral Daily   aspirin EC  81 mg Oral Daily   atorvastatin  20 mg Oral Daily   calcium acetate  2,668 mg Oral TID WC   cinacalcet  90 mg Oral Once per day on Mon Wed Fri   clopidogrel  75 mg Oral Daily   furosemide  40 mg Oral Daily   gabapentin  100 mg Oral TID   heparin  5,000 Units Subcutaneous Q8H   insulin aspart  0-6 Units Subcutaneous TID WC   metoprolol succinate  50 mg Oral Daily   montelukast  10 mg Oral Daily   pantoprazole  40 mg Oral Daily   sodium chloride flush  3 mL Intravenous Q12H   sodium chloride flush  3 mL Intravenous Q12H   tamsulosin  0.8 mg Oral QPC breakfast   Testosterone  1 application Transdermal Daily   Infusions:   sodium chloride      Labs: Recent Labs    11/14/20 0521 11/14/20 1440 11/15/20 0525  NA 136  --  134*  K 5.0  --  5.5*  CL 95*  --  92*  CO2 26  --  25  GLUCOSE 115*  --  122*  BUN 61*  --  61*  CREATININE 17.48* 14.54* 17.94*  CALCIUM 9.3  --  9.0  MG 2.6*  --   --    Recent Labs  11/14/20 0521  AST 12*  ALT 13  ALKPHOS 152*  BILITOT 0.7  PROT 7.6  ALBUMIN 3.9   Recent Labs    11/14/20 0521 11/15/20 0525  WBC 7.7 6.2  NEUTROABS 4.4  --   HGB 13.4 12.7*  HCT 41.0 39.1  MCV 99.5 97.3  PLT 234 225   No results for input(s): CKTOTAL, CKMB, TROPONINI in the last 72 hours. Invalid input(s): POCBNP Recent Labs    11/14/20 1440  HGBA1C 5.9*     Weights: Filed Weights   11/14/20 0437 11/14/20 2015  Weight: (!) 143 kg (!) 143 kg     Radiology/Studies:  CT ABDOMEN PELVIS WO CONTRAST  Result  Date: 11/14/2020 CLINICAL DATA:  Left lower quadrant pain EXAM: CT ABDOMEN AND PELVIS WITHOUT CONTRAST TECHNIQUE: Multidetector CT imaging of the abdomen and pelvis was performed following the standard protocol without IV contrast. COMPARISON:  CT 07/09/2016 FINDINGS: Lower chest: Lung bases demonstrate atelectasis at the right base. No acute consolidation or effusion. Coronary vascular calcification. Hepatobiliary: No focal liver abnormality is seen. No gallstones, gallbladder wall thickening, or biliary dilatation. Pancreas: Unremarkable. No pancreatic ductal dilatation or surrounding inflammatory changes. Spleen: Normal in size without focal abnormality. Adrenals/Urinary Tract: Adrenal glands are normal. Kidneys are atrophic. No hydronephrosis. Probable cyst upper pole left kidney. 2.3 cm slightly hyperdense lesion mid left kidney. The bladder is unremarkable Stomach/Bowel: Stomach is within normal limits. Appendix appears normal. No evidence of bowel wall thickening, distention, or inflammatory changes. Vascular/Lymphatic: Moderate aortic atherosclerosis. No aneurysm. No suspicious nodes. Reproductive: Prostate is unremarkable. Other: Negative for free air or free fluid Musculoskeletal: No acute osseous abnormality. Progression of bilateral SI joint arthropathy with sclerosis and lucency. Irregular endplate changes at S1, series 6, image 93, increased compared to prior. Facet degenerative changes at multiple levels. IMPRESSION: 1. No CT evidence for acute intra-abdominopelvic abnormality. 2. Atrophic kidneys. 2.3 cm hyperdense lesion within the mid left kidney, indeterminate. Consider correlation with ultrasound 3. Progression of bilateral SI joint arthropathy. Irregular endplate changes superiorly at S1. No corresponding paravertebral soft tissue abnormality. Correlate for any signs or symptoms of infection or back pain with follow-up MRI if indicated. Electronically Signed   By: Donavan Foil M.D.   On:  11/14/2020 15:30   VAS US DUPLEX DIALYSIS ACCESS (AVF, AVG)  Result Date: 10/30/2020 DIALYSIS ACCESS Patient Name:  Donald Fernandez  Date of Exam:   10/23/2020 Medical Rec #: [id number]         Accession #:    [id number] Date of Birth: 29-Nov-1970          Patient Gender: M Patient Age:   70Y Exam Location:  [facility] Procedure:      VAS US DUPLEX DIALYSIS ACCESS (AVF, AVG) Referring Phys: [id number] [place] --------------------------------------------------------------------------------  Reason for Exam: Routine follow up. Access Site: Left Upper Extremity. Access Type: WavLinq. History: 09/02/17: Left ulnar-ulnar AVF creation using radiofrequency with          [place] system & coil embolization of left brachial vein &          radiocephalic AVF;          [id number]: Ulnar artery & anastomosis PTA;          [id number]: Left cephalic vein superficialization;. Comparison Study: 04/2020 Performing Technologist: Concha Norway RVT  Examination Guidelines: A complete evaluation includes B-mode imaging, spectral Doppler, color Doppler, and power Doppler as needed of all accessible portions of each vessel. Unilateral testing is  considered an integral part of a complete examination. Limited examinations for reoccurring indications may be performed as noted.  Findings: +--------------------+----------+-----------------+--------+ AVF                 PSV (cm/s)Flow Vol (mL/min)Comments +--------------------+----------+-----------------+--------+ Native artery inflow   104          1366                +--------------------+----------+-----------------+--------+ AVF Anastomosis        144                              +--------------------+----------+-----------------+--------+  +------------+----------+-------------+----------+--------+ OUTFLOW VEINPSV (cm/s)Diameter (cm)Depth (cm)Describe +------------+----------+-------------+----------+--------+ Confluence     403                                     +------------+----------+-------------+----------+--------+ Shoulder       172                            stent   +------------+----------+-------------+----------+--------+ Prox UA        142                                    +------------+----------+-------------+----------+--------+ Mid UA          57                                    +------------+----------+-------------+----------+--------+ Dist UA         66        1.40                        +------------+----------+-------------+----------+--------+  +---------------+------------+----------+---------+--------+------------------+                  Diameter  Depth (cm)Branching  PSV      Flow Volume                        (cm)                        (cm/s)      (ml/min)      +---------------+------------+----------+---------+--------+------------------+ Left Rad Art                                     61                      Dis                                                                      +---------------+------------+----------+---------+--------+------------------+ Antegrade                                                                +---------------+------------+----------+---------+--------+------------------+  Summary: Patent left WavLinq AVF with no evidence of significant.  *See table(s) above for measurements and observations.  Diagnosing physician: Hortencia Pilar MD Electronically signed by Hortencia Pilar MD on 10/30/2020 at 5:10:13 PM.   --------------------------------------------------------------------------------   Final    ECHOCARDIOGRAM COMPLETE  Result Date: 11/14/2020    ECHOCARDIOGRAM REPORT   Patient Name:   Donald Fernandez Date of Exam: 11/14/2020 Medical Rec #:  [id number]        Height:       71.0 in Accession #:    [id number]       Weight:       315.3 lb Date of Birth:  06/05/1970         BSA:          2.558 m Patient Age:    24 years         BP:            137/105 mmHg Patient Gender: M                HR:           89 bpm. Exam Location:  [facility] Procedure: 2D Echo, Cardiac Doppler and Color Doppler Indications:     CHF-acute diastolic [id number]  History:         Patient has no prior history of Echocardiogram examinations.                  Risk Factors:Diabetes and Sleep Apnea.  Sonographer:     Sherrie Sport RDCS (AE) Referring Phys:  [place] Diagnosing Phys: Serafina Royals MD  Sonographer Comments: Technically difficult study due to poor echo windows, no apical window and no subcostal window. IMPRESSIONS  1. Left ventricular ejection fraction, by estimation, is 65 to 70%. The left ventricle has normal function. The left ventricle has no regional wall motion abnormalities. Left ventricular diastolic parameters were normal.  2. Right ventricular systolic function is normal. The right ventricular size is normal.  3. The mitral valve is normal in structure. Mild mitral valve regurgitation.  4. The aortic valve is normal in structure. Aortic valve regurgitation is trivial. FINDINGS  Left Ventricle: Left ventricular ejection fraction, by estimation, is 65 to 70%. The left ventricle has normal function. The left ventricle has no regional wall motion abnormalities. The left ventricular internal cavity size was small. There is no left ventricular hypertrophy. Left ventricular diastolic parameters were normal. Right Ventricle: The right ventricular size is normal. No increase in right ventricular wall thickness. Right ventricular systolic function is normal. Left Atrium: Left atrial size was normal in size. Right Atrium: Right atrial size was normal in size. Pericardium: There is no evidence of pericardial effusion. Mitral Valve: The mitral valve is normal in structure. Mild mitral valve regurgitation. Tricuspid Valve: The tricuspid valve is normal in structure. Tricuspid valve regurgitation is mild. Aortic Valve: The aortic valve is normal in structure. Aortic  valve regurgitation is trivial. Pulmonic Valve: The pulmonic valve was normal in structure. Pulmonic valve regurgitation is trivial. Aorta: The aortic root and ascending aorta are structurally normal, with no evidence of dilitation. IAS/Shunts: No atrial level shunt detected by color flow Doppler.  LEFT VENTRICLE PLAX 2D LVIDd:         4.24 cm LVIDs:         2.78 cm LV PW:         1.72 cm LV IVS:        1.50 cm  LEFT ATRIUM  Index LA diam:    3.40 cm 1.33 cm/m                        PULMONIC VALVE AORTA                 PV Vmax:        0.69 m/s Ao Root diam: 3.80 cm PV Peak grad:   1.9 mmHg                       RVOT Peak grad: 2 mmHg  Serafina Royals MD Electronically signed by Serafina Royals MD Signature Date/Time: 11/14/2020/5:23:05 PM    Final      Assessment and Recommendation  50 y.o. male with known chronic kidney disease stage V with weakness fatigue and irregular heartbeat multifactorial in nature with normal LV function and no evidence of valvular heart disease myocardial infarction or congestive heart failure having short runs of nonsustained wide-complex tachycardia likely secondary to supraventricular tachycardia with bundle branch block needing further medical management 1.  Change carvedilol to metoprolol for better control of irregular heartbeat palpitations tachycardia at 50 mg each day 2.  Begin ambulation and start dialysis and watch for any rhythm disturbances needing further adjustments 3.  If no rhythm disturbances occur with above will be okay for discharged home with further cardiac follow-up next week for further adjustments of medication management including the possibility of adjustments of medications as well as addition of Holter monitor for further evaluation of recurrence of rhythm disturbances and treatment options 4.  No further cardiac diagnostics necessary at this time  Signed, Serafina Royals M.D. FACC

## 2020-11-15 NOTE — Progress Notes (Addendum)
PROGRESS NOTE    Donald Fernandez  [id number] DOB: 1970/04/13 DOA: 11/14/2020 PCP: Denton Lank, MD    Brief Narrative:   Donald Fernandez is a 50 y.o. male with medical history significant for diabetes mellitus with complications of end-stage renal disease on hemodialysis, morbid obesity, obstructive sleep apnea, hypertension, dyslipidemia who was sent from the outpatient sleep lab to the emergency room for evaluation after he had an episode of 25 beat nonsustained V. Tach. Patient states that he was asleep when the nurse woke him up and told him that his heart had an abnormal rhythm    Consultants:  Cardiology  Procedures:   Antimicrobials:      Subjective: Denies dizziness, chest pain, shortness of breath this AM  Objective: Vitals:   11/14/20 1914 11/14/20 2015 11/15/20 0546 11/15/20 0823  BP: 104/63 123/82 (!) 98/52 (!) 108/45  Pulse: 89 96 84 92  Resp: 17 (!) '23 18 17  '$ Temp:  98.5 F (36.9 C) 98.3 F (36.8 C) 99.4 F (37.4 C)  TempSrc:  Oral    SpO2: 98% 98% 98% 100%  Weight:  (!) 143 kg    Height:  '5\' 11"'$  (1.803 m)      Intake/Output Summary (Last 24 hours) at 11/15/2020 0833 Last data filed at 11/14/2020 1237 Gross per 24 hour  Intake 100 ml  Output 802 ml  Net -702 ml   Filed Weights   11/14/20 0437 11/14/20 2015  Weight: (!) 143 kg (!) 143 kg    Examination:  General exam: Appears calm and comfortable  Respiratory system: Clear to auscultation. Respiratory effort normal. Cardiovascular system: S1 & S2 heard, RRR. No JVD, murmurs, rubs, gallops or clicks.  Gastrointestinal system: Abdomen is nondistended, soft and nontender.  Normal bowel sounds heard. Central nervous system: Alert and oriented. No focal neurological deficits. Extremities: no edema Psychiatry: Judgement and insight appear normal. Mood & affect appropriate.     Data Reviewed: I have personally reviewed following labs and imaging studies  CBC: Recent Labs  Lab 11/14/20 0521  11/15/20 0525  WBC 7.7 6.2  NEUTROABS 4.4  --   HGB 13.4 12.7*  HCT 41.0 39.1  MCV 99.5 97.3  PLT 234 [id number]   Basic Metabolic Panel: Recent Labs  Lab 11/14/20 0521 11/14/20 1440 11/15/20 0525  NA 136  --  134*  K 5.0  --  5.5*  CL 95*  --  92*  CO2 26  --  25  GLUCOSE 115*  --  122*  BUN 61*  --  61*  CREATININE 17.48* 14.54* 17.94*  CALCIUM 9.3  --  9.0  MG 2.6*  --   --    GFR: Estimated Creatinine Clearance: 7 mL/min (A) (by C-G formula based on SCr of 17.94 mg/dL (H)). Liver Function Tests: Recent Labs  Lab 11/14/20 0521  AST 12*  ALT 13  ALKPHOS 152*  BILITOT 0.7  PROT 7.6  ALBUMIN 3.9   No results for input(s): LIPASE, AMYLASE in the last 168 hours. No results for input(s): AMMONIA in the last 168 hours. Coagulation Profile: No results for input(s): INR, PROTIME in the last 168 hours. Cardiac Enzymes: No results for input(s): CKTOTAL, CKMB, CKMBINDEX, TROPONINI in the last 168 hours. BNP (last 3 results) No results for input(s): PROBNP in the last 8760 hours. HbA1C: Recent Labs    11/14/20 1440  HGBA1C 5.9*   CBG: No results for input(s): GLUCAP in the last 168 hours. Lipid Profile: No results for input(s): CHOL,  HDL, LDLCALC, TRIG, CHOLHDL, LDLDIRECT in the last 72 hours. Thyroid Function Tests: No results for input(s): TSH, T4TOTAL, FREET4, T3FREE, THYROIDAB in the last 72 hours. Anemia Panel: No results for input(s): VITAMINB12, FOLATE, FERRITIN, TIBC, IRON, RETICCTPCT in the last 72 hours. Sepsis Labs: No results for input(s): PROCALCITON, LATICACIDVEN in the last 168 hours.  Recent Results (from the past 240 hour(s))  SARS CORONAVIRUS 2 (TAT 6-24 HRS) Nasopharyngeal Nasopharyngeal Swab     Status: None   Collection Time: 11/11/20  8:22 AM   Specimen: Nasopharyngeal Swab  Result Value Ref Range Status   SARS Coronavirus 2 NEGATIVE NEGATIVE Final    Comment: (NOTE) SARS-CoV-2 target nucleic acids are NOT DETECTED.  The SARS-CoV-2 RNA is  generally detectable in upper and lower respiratory specimens during the acute phase of infection. Negative results do not preclude SARS-CoV-2 infection, do not rule out co-infections with other pathogens, and should not be used as the sole basis for treatment or other patient management decisions. Negative results must be combined with clinical observations, patient history, and epidemiological information. The expected result is Negative.  Fact Sheet for Patients: SugarRoll.be  Fact Sheet for Healthcare Providers: https://www.woods-mathews.com/  This test is not yet approved or cleared by the [place] FDA and  has been authorized for detection and/or diagnosis of SARS-CoV-2 by FDA under an Emergency Use Authorization (EUA). This EUA will remain  in effect (meaning this test can be used) for the duration of the COVID-19 declaration under Se ction 564(b)(1) of the Act, 21 U.S.C. section 360bbb-3(b)(1), unless the authorization is terminated or revoked sooner.  Performed at [facility], [place] [address] [place], [place] [address]   Resp Panel by RT-PCR (Flu A&B, Covid) Nasopharyngeal Swab     Status: None   Collection Time: 11/14/20  6:47 AM   Specimen: Nasopharyngeal Swab; Nasopharyngeal(NP) swabs in vial transport medium  Result Value Ref Range Status   SARS Coronavirus 2 by RT PCR NEGATIVE NEGATIVE Final    Comment: (NOTE) SARS-CoV-2 target nucleic acids are NOT DETECTED.  The SARS-CoV-2 RNA is generally detectable in upper respiratory specimens during the acute phase of infection. The lowest concentration of SARS-CoV-2 viral copies this assay can detect is 138 copies/mL. A negative result does not preclude SARS-Cov-2 infection and should not be used as the sole basis for treatment or other patient management decisions. A negative result may occur with  improper specimen collection/handling, submission of specimen  other than nasopharyngeal swab, presence of viral mutation(s) within the areas targeted by this assay, and inadequate number of viral copies(<138 copies/mL). A negative result must be combined with clinical observations, patient history, and epidemiological information. The expected result is Negative.  Fact Sheet for Patients:  EntrepreneurPulse.com.au  Fact Sheet for Healthcare Providers:  IncredibleEmployment.be  This test is no t yet approved or cleared by the [place] FDA and  has been authorized for detection and/or diagnosis of SARS-CoV-2 by FDA under an Emergency Use Authorization (EUA). This EUA will remain  in effect (meaning this test can be used) for the duration of the COVID-19 declaration under Section 564(b)(1) of the Act, 21 U.S.C.section 360bbb-3(b)(1), unless the authorization is terminated  or revoked sooner.       Influenza A by PCR NEGATIVE NEGATIVE Final   Influenza B by PCR NEGATIVE NEGATIVE Final    Comment: (NOTE) The Xpert Xpress SARS-CoV-2/FLU/RSV plus assay is intended as an aid in the diagnosis of influenza from Nasopharyngeal swab specimens and should not  be used as a sole basis for treatment. Nasal washings and aspirates are unacceptable for Xpert Xpress SARS-CoV-2/FLU/RSV testing.  Fact Sheet for Patients: EntrepreneurPulse.com.au  Fact Sheet for Healthcare Providers: IncredibleEmployment.be  This test is not yet approved or cleared by the [place] FDA and has been authorized for detection and/or diagnosis of SARS-CoV-2 by FDA under an Emergency Use Authorization (EUA). This EUA will remain in effect (meaning this test can be used) for the duration of the COVID-19 declaration under Section 564(b)(1) of the Act, 21 U.S.C. section 360bbb-3(b)(1), unless the authorization is terminated or revoked.  Performed at [facility], [address]  [place], [place] [address]          Radiology Studies: CT ABDOMEN PELVIS WO CONTRAST  Result Date: 11/14/2020 CLINICAL DATA:  Left lower quadrant pain EXAM: CT ABDOMEN AND PELVIS WITHOUT CONTRAST TECHNIQUE: Multidetector CT imaging of the abdomen and pelvis was performed following the standard protocol without IV contrast. COMPARISON:  CT 07/09/2016 FINDINGS: Lower chest: Lung bases demonstrate atelectasis at the right base. No acute consolidation or effusion. Coronary vascular calcification. Hepatobiliary: No focal liver abnormality is seen. No gallstones, gallbladder wall thickening, or biliary dilatation. Pancreas: Unremarkable. No pancreatic ductal dilatation or surrounding inflammatory changes. Spleen: Normal in size without focal abnormality. Adrenals/Urinary Tract: Adrenal glands are normal. Kidneys are atrophic. No hydronephrosis. Probable cyst upper pole left kidney. 2.3 cm slightly hyperdense lesion mid left kidney. The bladder is unremarkable Stomach/Bowel: Stomach is within normal limits. Appendix appears normal. No evidence of bowel wall thickening, distention, or inflammatory changes. Vascular/Lymphatic: Moderate aortic atherosclerosis. No aneurysm. No suspicious nodes. Reproductive: Prostate is unremarkable. Other: Negative for free air or free fluid Musculoskeletal: No acute osseous abnormality. Progression of bilateral SI joint arthropathy with sclerosis and lucency. Irregular endplate changes at S1, series 6, image 93, increased compared to prior. Facet degenerative changes at multiple levels. IMPRESSION: 1. No CT evidence for acute intra-abdominopelvic abnormality. 2. Atrophic kidneys. 2.3 cm hyperdense lesion within the mid left kidney, indeterminate. Consider correlation with ultrasound 3. Progression of bilateral SI joint arthropathy. Irregular endplate changes superiorly at S1. No corresponding paravertebral soft tissue abnormality. Correlate for any signs or symptoms of infection or back  pain with follow-up MRI if indicated. Electronically Signed   By: Donavan Foil M.D.   On: 11/14/2020 15:30   ECHOCARDIOGRAM COMPLETE  Result Date: 11/14/2020    ECHOCARDIOGRAM REPORT   Patient Name:   LANARD ADAMCZYK Date of Exam: 11/14/2020 Medical Rec #:  [id number]        Height:       71.0 in Accession #:    [id number]       Weight:       315.3 lb Date of Birth:  [birth year]         BSA:          2.558 m Patient Age:    52 years         BP:           137/105 mmHg Patient Gender: M                HR:           89 bpm. Exam Location:  [facility] Procedure: 2D Echo, Cardiac Doppler and Color Doppler Indications:     CHF-acute diastolic [id number]  History:         Patient has no prior history of Echocardiogram examinations.  Risk Factors:Diabetes and Sleep Apnea.  Sonographer:     Sherrie Sport RDCS (AE) Referring Phys:  Mutual Diagnosing Phys: Serafina Royals MD  Sonographer Comments: Technically difficult study due to poor echo windows, no apical window and no subcostal window. IMPRESSIONS  1. Left ventricular ejection fraction, by estimation, is 65 to 70%. The left ventricle has normal function. The left ventricle has no regional wall motion abnormalities. Left ventricular diastolic parameters were normal.  2. Right ventricular systolic function is normal. The right ventricular size is normal.  3. The mitral valve is normal in structure. Mild mitral valve regurgitation.  4. The aortic valve is normal in structure. Aortic valve regurgitation is trivial. FINDINGS  Left Ventricle: Left ventricular ejection fraction, by estimation, is 65 to 70%. The left ventricle has normal function. The left ventricle has no regional wall motion abnormalities. The left ventricular internal cavity size was small. There is no left ventricular hypertrophy. Left ventricular diastolic parameters were normal. Right Ventricle: The right ventricular size is normal. No increase in right ventricular wall thickness. Right  ventricular systolic function is normal. Left Atrium: Left atrial size was normal in size. Right Atrium: Right atrial size was normal in size. Pericardium: There is no evidence of pericardial effusion. Mitral Valve: The mitral valve is normal in structure. Mild mitral valve regurgitation. Tricuspid Valve: The tricuspid valve is normal in structure. Tricuspid valve regurgitation is mild. Aortic Valve: The aortic valve is normal in structure. Aortic valve regurgitation is trivial. Pulmonic Valve: The pulmonic valve was normal in structure. Pulmonic valve regurgitation is trivial. Aorta: The aortic root and ascending aorta are structurally normal, with no evidence of dilitation. IAS/Shunts: No atrial level shunt detected by color flow Doppler.  LEFT VENTRICLE PLAX 2D LVIDd:         4.24 cm LVIDs:         2.78 cm LV PW:         1.72 cm LV IVS:        1.50 cm  LEFT ATRIUM         Index LA diam:    3.40 cm 1.33 cm/m                        PULMONIC VALVE AORTA                 PV Vmax:        0.69 m/s Ao Root diam: 3.80 cm PV Peak grad:   1.9 mmHg                       RVOT Peak grad: 2 mmHg  Serafina Royals MD Electronically signed by Serafina Royals MD Signature Date/Time: 11/14/2020/5:23:05 PM    Final         Scheduled Meds:  anastrozole  0.5 mg Oral Daily   aspirin EC  81 mg Oral Daily   atorvastatin  20 mg Oral Daily   calcium acetate  2,668 mg Oral TID WC   cinacalcet  90 mg Oral Once per day on Mon Wed Fri   clopidogrel  75 mg Oral Daily   furosemide  40 mg Oral Daily   gabapentin  100 mg Oral TID   heparin  5,000 Units Subcutaneous Q8H   insulin aspart  0-6 Units Subcutaneous TID WC   metoprolol succinate  50 mg Oral Daily   montelukast  10 mg Oral Daily   pantoprazole  40  mg Oral Daily   sodium chloride flush  3 mL Intravenous Q12H   sodium chloride flush  3 mL Intravenous Q12H   tamsulosin  0.8 mg Oral QPC breakfast   Testosterone  1 application Transdermal Daily   Continuous Infusions:   sodium chloride      Assessment & Plan:   Principal Problem:   Ventricular tachycardia (paroxysmal) (HCC) Active Problems:   Essential hypertension   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)   OSA (obstructive sleep apnea)   Morbid obesity with BMI of 45.0-49.9, adult (HCC)   CAD (coronary artery disease)   Nicotine dependence   Nonsustained V. Tach Patient was referred to the ER from outpatient sleep center after he had an episode of 25 beats of nonsustained ventricular tachycardia. He is asymptomatic but complained of dizziness earlier in the day prior to his admission. He has a significant history of coronary artery disease as well as hypertension. 8/6 cardiology following-change carvedilol to metoprolol Per cardiology his wide-complex tachycardia likely secondary to supraventricular tachycardia with bundle branch block  May need Holter as outpatient  Continue telemetry  Echo with normal EF and no wall motion abnormality No further cardiac diagnostic necessary at this time Continue aspirin, Plavix, statin and beta-blockers        Diabetes mellitus with complications of end-stage renal disease on hemodialysis Dialysis days are M/W/F 8/6 nephrology following BG stable Plan for dialysis tomorrow as yesterday he was unable to tolerate treatment due to hypotension and moving all around    Hypertension Was on low side  Continue Toprol-XL as needed for wide-complex tachy Continue other meds and continue monitoring BP     Morbid obesity with complications of obstructive sleep apnea (BMI [id number] kg/m2) Complicates overall prognosis and care  Will need CPAP at night        Nicotine dependence S smoking cessation was discussed during hospitalization  Patient declined nicotine patch   Hyperkalemia-mild 5.5. received lasix.  On HD Monitor levels  DVT prophylaxis: Heparin Code Status: Full Family Communication: None at bedside Disposition Plan:  Status  is: Observation  The patient remains OBS appropriate and will d/c before 2 midnights.  Dispo: The patient is from: Home              Anticipated d/c is to: Home              Patient currently is not medically stable to d/c.   Difficult to place patient No            LOS: 0 days   Time spent: 45 minutes with more than 50% on [place], MD [facility] Pager [phone]  If 7PM-7AM, please contact night-coverage 11/15/2020, 8:33 AM

## 2020-11-15 NOTE — TOC Initial Note (Signed)
Transition of Care [facility]) - Initial/Assessment Note    Patient Details  Name: Donald Fernandez MRN: [id number] Date of Birth: [birth year]  [facility]) CM/SW Contact:    Alberteen Sam, LCSW Phone Number: 11/15/2020, 9:58 AM  Clinical Narrative:                  CSW consulted for medication assistance. Spoke with patient who reports he has disability discounts, and Education officer, museum at his dialysis center was helping him work on his Insurance underwriter. Reports he has questions regarding this. CSW informed him that financial counselor Salomon Fick will be reaching out to him to answer any question regarding his medicaid application. Confirms he still see his PCP Dr. Posey Pronto.   Patient reports no other discharge needs.   CSW has Education administrator to follow up with patient regarding Medicaid application questions.   Expected Discharge Plan: Home/Self Care Barriers to Discharge: Continued Medical Work up   Patient Goals and CMS Choice Patient states their goals for this hospitalization and ongoing recovery are:: to go home CMS Medicare.gov Compare Post Acute Care list provided to:: Patient Choice offered to / list presented to : Patient  Expected Discharge Plan and Services Expected Discharge Plan: Home/Self Care       Living arrangements for the past 2 months: Single Family Home                                      Prior Living Arrangements/Services Living arrangements for the past 2 months: Single Family Home Lives with:: Self                   Activities of Daily Living Home Assistive Devices/Equipment: None ADL Screening (condition at time of admission) Patient's cognitive ability adequate to safely complete daily activities?: Yes Is the patient deaf or have difficulty hearing?: No Does the patient have difficulty seeing, even when wearing glasses/contacts?: No Does the patient have difficulty concentrating, remembering, or making decisions?:  No Patient able to express need for assistance with ADLs?: Yes Does the patient have difficulty dressing or bathing?: No Independently performs ADLs?: Yes (appropriate for developmental age) Does the patient have difficulty walking or climbing stairs?: No Weakness of Legs: None Weakness of Arms/Hands: None  Permission Sought/Granted                  Emotional Assessment   Attitude/Demeanor/Rapport: Gracious Affect (typically observed): Calm Orientation: : Oriented to Self, Oriented to Place, Oriented to  Time, Oriented to Situation Alcohol / Substance Use: Not Applicable Psych Involvement: No (comment)  Admission diagnosis:  Ventricular tachycardia ([place]) [I47.2] Ventricular tachycardia (paroxysmal) (HCC) [I47.2] Patient Active Problem List   Diagnosis Date Noted   Ventricular tachycardia (paroxysmal) ([place]) 11/14/2020   CAD (coronary artery disease) 11/14/2020   Nicotine dependence 11/14/2020   Morbid obesity with BMI of 45.0-49.9, adult ([place]) 06/21/2019   Lumbar facet hypertrophy (Multilevel) (Bilateral) 03/26/2019   Lumbar foraminal stenosis (Multilevel) (Bilateral) 03/26/2019   DDD (degenerative disc disease), lumbosacral 03/26/2019   Sacroiliitis (Bilateral) 03/26/2019   Osteoarthritis of lumbar spine 03/26/2019   Osteoarthritis of facet joint of lumbar spine 03/26/2019   Chronic pain syndrome 03/26/2019   Pharmacologic therapy 03/26/2019   Disorder of skeletal system 03/26/2019   Problems influencing health status 03/26/2019   Chronic gout 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 tophus 03/26/2019   Chronic kidney disease (CKD)  Stage 5, on chronic dialysis ([place]) 03/26/2019   Chronic low back pain (Primary Area of Pain) (Bilateral) (L>R) w/o sciatica 03/26/2019   Lumbar facet syndrome (Bilateral) (L>R) 03/26/2019   Chronic lower extremity pain (Bilateral) (L>R) 03/26/2019   Spondylosis without myelopathy or radiculopathy, lumbosacral region 03/26/2019   Chronic  anticoagulation (Plavix) 03/26/2019   Anemia 03/15/2019   Occlusion and stenosis of basilar artery 08/24/2018   ESRD on hemodialysis ([place]) 06/07/2018   Gastroesophageal reflux disease without esophagitis 06/07/2018   Personal history of gout 06/07/2018   Small vessel disease, cerebrovascular [id number]   Complication of vascular access for dialysis 10/31/2017   ESRD on dialysis ([place]) 07/27/2017   Angioedema 06/05/2017   Pseudotumor cerebri 12/17/2016   Essential hypertension 08/02/2016   Type 2 DM with hypertension and ESRD on dialysis ([place]) 08/02/2016   Papilledema 07/30/2016   Severe uncontrolled hypertension 07/30/2016   Acute on chronic renal failure ([place]) 07/09/2016   OSA (obstructive sleep apnea) 11/27/2014   PCP:  Denton Lank, MD Pharmacy:   [facility] [address]), [place] [address] [place])  [address] Phone: [phone] Fax: [phone]     Social Determinants of Health ([place]) Interventions    Readmission Risk Interventions No flowsheet data found.

## 2020-11-15 NOTE — Progress Notes (Signed)
[facility]  ROUNDING NOTE   Subjective:   [name] was admitted to [facility] on 11/14/2020 for Ventricular tachycardia [facility]) [I47.2] Ventricular tachycardia (paroxysmal) ([place]) [I47.2]  Last hemodialysis treatment was Wednesday, he completed 3 hours of his treatment.   Patient was doing an overnight sleep study when he started having runs of V-tach. Patient was sent to the ED. Patient was brought for dialysis but was unable to tolerate treatment.   Patient seen sitting up in bed Alert and oriented Tolerating meals Denies shortness of breath Complains of soreness in lower back/buttocks from sitting/laying   Objective:  Vital signs in last 24 hours:  Temp:  [98.2 F (36.8 C)-99.4 F (37.4 C)] 98.7 F (37.1 C) (08/06 1108) Pulse Rate:  [84-96] 87 (08/06 1108) Resp:  [17-23] 20 (08/06 1108) BP: (87-138)/(45-108) 138/108 (08/06 1108) SpO2:  [97 %-100 %] 97 % (08/06 1108) Weight:  [143 kg] 143 kg (08/05 2015)  Weight change: 0 kg Filed Weights   11/14/20 0437 11/14/20 2015  Weight: (!) 143 kg (!) 143 kg    Intake/Output: I/O last 3 completed shifts: In: 100 [IV Piggyback:100] Out: 802 [Other:802]   Intake/Output this shift:  No intake/output data recorded.  Physical Exam: General: NAD, sitting up in bed  Head: Normocephalic, atraumatic. Moist oral mucosal membranes  Eyes: Anicteric  Lungs:  Clear to auscultation, normal effort  Heart: Regular rate and rhythm  Abdomen:  Soft, nontender  Extremities:  No peripheral edema.  Neurologic: Nonfocal, moving all four extremities  Skin: No lesions  Access: Left AVF    Basic Metabolic Panel: Recent Labs  Lab 11/14/20 0521 11/14/20 1440 11/15/20 0525  NA 136  --  134*  K 5.0  --  5.5*  CL 95*  --  92*  CO2 26  --  25  GLUCOSE 115*  --  122*  BUN 61*  --  61*  CREATININE 17.48* 14.54* 17.94*  CALCIUM 9.3  --  9.0  MG 2.6*  --   --      Liver Function Tests: Recent Labs  Lab 11/14/20 0521   AST 12*  ALT 13  ALKPHOS 152*  BILITOT 0.7  PROT 7.6  ALBUMIN 3.9    No results for input(s): LIPASE, AMYLASE in the last 168 hours. No results for input(s): AMMONIA in the last 168 hours.  CBC: Recent Labs  Lab 11/14/20 0521 11/15/20 0525  WBC 7.7 6.2  NEUTROABS 4.4  --   HGB 13.4 12.7*  HCT 41.0 39.1  MCV 99.5 97.3  PLT 234 225     Cardiac Enzymes: No results for input(s): CKTOTAL, CKMB, CKMBINDEX, TROPONINI in the last 168 hours.  BNP: Invalid input(s): POCBNP  CBG: Recent Labs  Lab 11/15/20 0921 11/15/20 1321  GLUCAP 155* 120*    Microbiology: Results for orders placed or performed during the hospital encounter of 11/14/20  Resp Panel by RT-PCR (Flu A&B, Covid) Nasopharyngeal Swab     Status: None   Collection Time: 11/14/20  6:47 AM   Specimen: Nasopharyngeal Swab; Nasopharyngeal(NP) swabs in vial transport medium  Result Value Ref Range Status   SARS Coronavirus 2 by RT PCR NEGATIVE NEGATIVE Final    Comment: (NOTE) SARS-CoV-2 target nucleic acids are NOT DETECTED.  The SARS-CoV-2 RNA is generally detectable in upper respiratory specimens during the acute phase of infection. The lowest concentration of SARS-CoV-2 viral copies this assay can detect is 138 copies/mL. A negative result does not preclude SARS-Cov-2 infection and should  not be used as the sole basis for treatment or other patient management decisions. A negative result may occur with  improper specimen collection/handling, submission of specimen other than nasopharyngeal swab, presence of viral mutation(s) within the areas targeted by this assay, and inadequate number of viral copies(<138 copies/mL). A negative result must be combined with clinical observations, patient history, and epidemiological information. The expected result is Negative.  Fact Sheet for Patients:  EntrepreneurPulse.com.au  Fact Sheet for Healthcare Providers:   IncredibleEmployment.be  This test is no t yet approved or cleared by the [place] FDA and  has been authorized for detection and/or diagnosis of SARS-CoV-2 by FDA under an Emergency Use Authorization (EUA). This EUA will remain  in effect (meaning this test can be used) for the duration of the COVID-19 declaration under Section 564(b)(1) of the Act, 21 U.S.C.section 360bbb-3(b)(1), unless the authorization is terminated  or revoked sooner.       Influenza A by PCR NEGATIVE NEGATIVE Final   Influenza B by PCR NEGATIVE NEGATIVE Final    Comment: (NOTE) The Xpert Xpress SARS-CoV-2/FLU/RSV plus assay is intended as an aid in the diagnosis of influenza from Nasopharyngeal swab specimens and should not be used as a sole basis for treatment. Nasal washings and aspirates are unacceptable for Xpert Xpress SARS-CoV-2/FLU/RSV testing.  Fact Sheet for Patients: EntrepreneurPulse.com.au  Fact Sheet for Healthcare Providers: IncredibleEmployment.be  This test is not yet approved or cleared by the [place] FDA and has been authorized for detection and/or diagnosis of SARS-CoV-2 by FDA under an Emergency Use Authorization (EUA). This EUA will remain in effect (meaning this test can be used) for the duration of the COVID-19 declaration under Section 564(b)(1) of the Act, 21 U.S.C. section 360bbb-3(b)(1), unless the authorization is terminated or revoked.  Performed at [facility], [address] [place], [place] [address]     Coagulation Studies: No results for input(s): LABPROT, INR in the last 72 hours.  Urinalysis: No results for input(s): COLORURINE, LABSPEC, PHURINE, GLUCOSEU, HGBUR, BILIRUBINUR, KETONESUR, PROTEINUR, UROBILINOGEN, NITRITE, LEUKOCYTESUR in the last 72 hours.  Invalid input(s): APPERANCEUR    Imaging: CT ABDOMEN PELVIS WO CONTRAST  Result Date: 11/14/2020 CLINICAL DATA:  Left lower  quadrant pain EXAM: CT ABDOMEN AND PELVIS WITHOUT CONTRAST TECHNIQUE: Multidetector CT imaging of the abdomen and pelvis was performed following the standard protocol without IV contrast. COMPARISON:  CT 07/09/2016 FINDINGS: Lower chest: Lung bases demonstrate atelectasis at the right base. No acute consolidation or effusion. Coronary vascular calcification. Hepatobiliary: No focal liver abnormality is seen. No gallstones, gallbladder wall thickening, or biliary dilatation. Pancreas: Unremarkable. No pancreatic ductal dilatation or surrounding inflammatory changes. Spleen: Normal in size without focal abnormality. Adrenals/Urinary Tract: Adrenal glands are normal. Kidneys are atrophic. No hydronephrosis. Probable cyst upper pole left kidney. 2.3 cm slightly hyperdense lesion mid left kidney. The bladder is unremarkable Stomach/Bowel: Stomach is within normal limits. Appendix appears normal. No evidence of bowel wall thickening, distention, or inflammatory changes. Vascular/Lymphatic: Moderate aortic atherosclerosis. No aneurysm. No suspicious nodes. Reproductive: Prostate is unremarkable. Other: Negative for free air or free fluid Musculoskeletal: No acute osseous abnormality. Progression of bilateral SI joint arthropathy with sclerosis and lucency. Irregular endplate changes at S1, series 6, image 93, increased compared to prior. Facet degenerative changes at multiple levels. IMPRESSION: 1. No CT evidence for acute intra-abdominopelvic abnormality. 2. Atrophic kidneys. 2.3 cm hyperdense lesion within the mid left kidney, indeterminate. Consider correlation with ultrasound 3. Progression of bilateral SI joint arthropathy. Irregular  endplate changes superiorly at S1. No corresponding paravertebral soft tissue abnormality. Correlate for any signs or symptoms of infection or back pain with follow-up MRI if indicated. Electronically Signed   By: Donavan Foil M.D.   On: 11/14/2020 15:30   ECHOCARDIOGRAM  COMPLETE  Result Date: 11/14/2020    ECHOCARDIOGRAM REPORT   Patient Name:   Donald Fernandez Date of Exam: 11/14/2020 Medical Rec #:  [id number]        Height:       71.0 in Accession #:    [id number]       Weight:       315.3 lb Date of Birth:  02-Jan-1971         BSA:          2.558 m Patient Age:    38 years         BP:           137/105 mmHg Patient Gender: M                HR:           89 bpm. Exam Location:  [facility] Procedure: 2D Echo, Cardiac Doppler and Color Doppler Indications:     CHF-acute diastolic [id number]  History:         Patient has no prior history of Echocardiogram examinations.                  Risk Factors:Diabetes and Sleep Apnea.  Sonographer:     Sherrie Sport RDCS (AE) Referring Phys:  [place] Diagnosing Phys: Serafina Royals MD  Sonographer Comments: Technically difficult study due to poor echo windows, no apical window and no subcostal window. IMPRESSIONS  1. Left ventricular ejection fraction, by estimation, is 65 to 70%. The left ventricle has normal function. The left ventricle has no regional wall motion abnormalities. Left ventricular diastolic parameters were normal.  2. Right ventricular systolic function is normal. The right ventricular size is normal.  3. The mitral valve is normal in structure. Mild mitral valve regurgitation.  4. The aortic valve is normal in structure. Aortic valve regurgitation is trivial. FINDINGS  Left Ventricle: Left ventricular ejection fraction, by estimation, is 65 to 70%. The left ventricle has normal function. The left ventricle has no regional wall motion abnormalities. The left ventricular internal cavity size was small. There is no left ventricular hypertrophy. Left ventricular diastolic parameters were normal. Right Ventricle: The right ventricular size is normal. No increase in right ventricular wall thickness. Right ventricular systolic function is normal. Left Atrium: Left atrial size was normal in size. Right Atrium: Right atrial size was  normal in size. Pericardium: There is no evidence of pericardial effusion. Mitral Valve: The mitral valve is normal in structure. Mild mitral valve regurgitation. Tricuspid Valve: The tricuspid valve is normal in structure. Tricuspid valve regurgitation is mild. Aortic Valve: The aortic valve is normal in structure. Aortic valve regurgitation is trivial. Pulmonic Valve: The pulmonic valve was normal in structure. Pulmonic valve regurgitation is trivial. Aorta: The aortic root and ascending aorta are structurally normal, with no evidence of dilitation. IAS/Shunts: No atrial level shunt detected by color flow Doppler.  LEFT VENTRICLE PLAX 2D LVIDd:         4.24 cm LVIDs:         2.78 cm LV PW:         1.72 cm LV IVS:        1.50 cm  LEFT ATRIUM  Index LA diam:    3.40 cm 1.33 cm/m                        PULMONIC VALVE AORTA                 PV Vmax:        0.69 m/s Ao Root diam: 3.80 cm PV Peak grad:   1.9 mmHg                       RVOT Peak grad: 2 mmHg  Serafina Royals MD Electronically signed by Serafina Royals MD Signature Date/Time: 11/14/2020/5:23:05 PM    Final      Medications:    sodium chloride      anastrozole  0.5 mg Oral Daily   aspirin EC  81 mg Oral Daily   atorvastatin  20 mg Oral Daily   calcium acetate  2,668 mg Oral TID WC   cinacalcet  90 mg Oral Once per day on Mon Wed Fri   clopidogrel  75 mg Oral Daily   furosemide  40 mg Oral Daily   gabapentin  100 mg Oral TID   heparin  5,000 Units Subcutaneous Q8H   insulin aspart  0-6 Units Subcutaneous TID WC   metoprolol succinate  50 mg Oral Daily   montelukast  10 mg Oral Daily   pantoprazole  40 mg Oral Daily   sodium chloride flush  3 mL Intravenous Q12H   sodium chloride flush  3 mL Intravenous Q12H   tamsulosin  0.8 mg Oral QPC breakfast   sodium chloride, acetaminophen **OR** acetaminophen, docusate sodium, fluticasone, ondansetron **OR** ondansetron (ZOFRAN) IV, sodium chloride flush  Assessment/ Plan:  [name] is a 50 y.o. Black male with end stage renal disease on hemodialysis, hypertension, hyperlipidemia, BPH, diabetes mellitus type II, GERd, gout, sleep apnea who is admitted to [facility] on 11/14/2020 for Ventricular tachycardia ([place]) [I47.2] Ventricular tachycardia (paroxysmal) ([place]) [I47.2]  [facility] M[facility] Left AVF 143kg  End Stage Renal Disease: patient unable to tolerate treatment due to discomfort of stretcher and hypotension. Scheduled to receive dialysis today.   Hypotension: with history of hypertension. New onset V-tach. Appreciate cardiology input. May need to hold blood pressure agents if remains low: amlodipine, furosemide, carvedilol and tamsulosin. BP 108/45   Anemia with chronic kidney disease: Hemoglobin 12.7 - at goal. ESA at outpatient.   Secondary Hyperparathyroidism: Continue calcium acetate with meals and cinacalcet three times a week.    LOS: 0 Oriel Rumbold 8/6/20221:43 PM

## 2020-11-15 NOTE — Progress Notes (Signed)
Patient started on dialysis as ordered. Patient hypotensive, asymptomatic. Patient had 2016 removed before tubing line clotted. Dr. Candiss Norse notified. Patient stable and was transferred back to unit post treatment.

## 2020-11-16 DIAGNOSIS — I472 Ventricular tachycardia: Secondary | ICD-10-CM | POA: Diagnosis not present

## 2020-11-16 LAB — BASIC METABOLIC PANEL
Anion gap: 15 (ref 5–15)
BUN: 56 mg/dL — ABNORMAL HIGH (ref 6–20)
CO2: 25 mmol/L (ref 22–32)
Calcium: 8.9 mg/dL (ref 8.9–10.3)
Chloride: 91 mmol/L — ABNORMAL LOW (ref 98–111)
Creatinine, Ser: 16.21 mg/dL — ABNORMAL HIGH (ref 0.61–1.24)
GFR, Estimated: 3 mL/min — ABNORMAL LOW (ref >60)
Glucose, Bld: 113 mg/dL — ABNORMAL HIGH (ref 70–99)
Potassium: 5.4 mmol/L — ABNORMAL HIGH (ref 3.5–5.1)
Sodium: 131 mmol/L — ABNORMAL LOW (ref 135–145)

## 2020-11-16 LAB — GLUCOSE, CAPILLARY
Glucose-Capillary: 118 mg/dL — ABNORMAL HIGH (ref 70–99)
Glucose-Capillary: 118 mg/dL — ABNORMAL HIGH (ref 70–99)

## 2020-11-16 MED ORDER — MIDODRINE HCL 5 MG PO TABS
10.0000 mg | ORAL_TABLET | Freq: Three times a day (TID) | ORAL | Status: DC
  Administered 2020-11-16: 10 mg via ORAL
  Filled 2020-11-16: qty 2

## 2020-11-16 MED ORDER — IBUPROFEN 400 MG PO TABS: 400.0000 mg | ORAL_TABLET | Freq: Four times a day (QID) | ORAL | Status: DC | PRN

## 2020-11-16 MED ORDER — MIDODRINE HCL 10 MG PO TABS: 10.0000 mg | ORAL_TABLET | Freq: Three times a day (TID) | ORAL | 0 refills | Status: AC

## 2020-11-16 MED ORDER — METOPROLOL SUCCINATE ER 50 MG PO TB24: 50.0000 mg | ORAL_TABLET | Freq: Every day | ORAL | 0 refills | Status: DC

## 2020-11-16 MED ORDER — MIDODRINE HCL 5 MG PO TABS: 5.0000 mg | ORAL_TABLET | Freq: Three times a day (TID) | ORAL | Status: DC

## 2020-11-16 MED ORDER — CINACALCET HCL 90 MG PO TABS: 90.0000 mg | ORAL_TABLET | ORAL | 0 refills | Status: AC

## 2020-11-16 NOTE — Progress Notes (Signed)
Patient discharged to home via wheelchair, accompanied by volunteer and family. Discharge teaching completed and included: new medication regimen, instructions on where to pick up scripts, diet information, follow-up appointment instructions, and S&S to report/return with immediately. Patient verbalizes understanding of all discharge instructions and asks appropriate questions.

## 2020-11-16 NOTE — Discharge Summary (Signed)
Donald Fernandez [id number] DOB: 04/04/1971 DOA: 11/14/2020  PCP: Denton Lank, MD  Admit date: 11/14/2020 Discharge date: 11/16/2020  Admitted From: Home Disposition: Home  Recommendations for Outpatient Follow-up:  Follow up with PCP in 1 week Please obtain BMP/CBC in one week Please follow up cardiology next week for Holter monitor Follow-up with dialysis as scheduled     Discharge Condition:Stable CODE STATUS: Full Diet recommendation: Renal diet    Brief/Interim Summary: Per [id number] A Altimus is a 50 y.o. male with medical history significant for diabetes mellitus with complications of end-stage renal disease on hemodialysis, morbid obesity, obstructive sleep apnea, hypertension, dyslipidemia who was sent from the outpatient sleep lab to the emergency room for evaluation after he had an episode of 25 beat nonsustained V. Tach. Patient stated that he was asleep when the nurse woke him up and told him that his heart had an abnormal rhythm. He had an episode of dizziness 1 day prior to his admission but notes that for the last 2 months he has not felt well and usually has episodes of dizziness mostly post his dialysis sessions.  Per patient he had discussed this with his nephrologist and was told it was probably due to excess fluid being pulled during dialysis. Cardiology was consulted here.  He was placed on telemetry.  Patient had dialysis is here.  Since he had low blood pressures he was started on midodrine and his blood pressure medications were discontinued including his Lasix and only continued on metoprolol for his arrhythmia that was reported.  Was discussed with nephrology and they were okay with this.  Nonsustained  wide complex tachycardia Per cardiology likely secondary to supraventricular tachycardia with bundle branch block No rhythm disturbances occurred and cardiology was okay with  patient being discharged home with further cardiac follow-up next week for further  adjustments of medication management including the possibility of adjustments of medications as well as addition of Holter monitor for further evaluation of recurrence of rhythm disturbances and treatment options. Echo with normal EF and no wall motion abnormality No further cardiac diagnostics necessary at this time His coreg was switched to Toprol by cardiology.       Diabetes mellitus with complications of end-stage renal disease on hemodialysis Dialysis days are M/W/F nephrology following Follow up with HD tomorrow as scheduled      HTN asx can dc home Only continue beta blk as above    Morbid obesity with complications of obstructive sleep apnea (BMI [id number] kg/m2) Complicates overall prognosis and care Will need CPAP at night ..>follow up with pcp         Nicotine dependence smoking cessation was discussed during hospitalization Patient declined nicotine patch    Hyperkalemia-mild On HD    Discharge Diagnoses:  Principal Problem:   Ventricular tachycardia (paroxysmal) ([place]) Active Problems:   Essential hypertension   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)   OSA (obstructive sleep apnea)   Morbid obesity with BMI of 45.0-49.9, adult (HCC)   CAD (coronary artery disease)   Nicotine dependence    Discharge Instructions  Discharge Instructions     Call MD for:  persistant dizziness or light-headedness   Complete by: As directed    Diet - low sodium heart healthy   Complete by: As directed    Discharge instructions   Complete by: As directed    Stop all your blood pressure medications and lasix. Only take metoprolol. F/u with cardiology next week for monitor placement  Increase activity slowly   Complete by: As directed       Allergies as of 11/16/2020       Reactions   Accupril [quinapril Hcl] Anaphylaxis   Angioedema   Ace Inhibitors Anaphylaxis, Other (See Comments)   Angioedema Other reaction(s):  Unknown Angioedema Angioedema Other reaction(s): Unknown Angioedema Angioedema Angioedema Angioedema        Medication List     STOP taking these medications    amLODipine 5 MG tablet Commonly known as: NORVASC   carvedilol 3.125 MG tablet Commonly known as: COREG   cloNIDine 0.1 MG tablet Commonly known as: CATAPRES   furosemide 40 MG tablet Commonly known as: LASIX   tadalafil 20 MG tablet Commonly known as: CIALIS       TAKE these medications    anastrozole 1 MG tablet Commonly known as: ARIMIDEX TAKE ONE-HALF TABLET BY MOUTH ONCE DAILY   aspirin EC 81 MG tablet Take 81 mg by mouth daily.   atorvastatin 20 MG tablet Commonly known as: LIPITOR Take 20 mg by mouth daily.   calcium acetate 667 MG capsule Commonly known as: PHOSLO Take 2,668 mg by mouth 3 (three) times daily with meals.   cinacalcet 90 MG tablet Commonly known as: Sensipar Take 1 tablet (90 mg total) by mouth every Monday, Wednesday, and Friday. Start taking on: November 17, 2020   clopidogrel 75 MG tablet Commonly known as: PLAVIX Take 1 tablet by mouth once daily   docusate sodium 100 MG capsule Commonly known as: COLACE Take 100 mg by mouth daily as needed for mild constipation.   fluticasone 50 MCG/ACT nasal spray Commonly known as: FLONASE Place 1 spray into both nostrils daily as needed for allergies or rhinitis.   gabapentin 100 MG capsule Commonly known as: NEURONTIN Take 1 capsule (100 mg total) by mouth 3 (three) times daily.   metoprolol succinate 50 MG 24 hr tablet Commonly known as: TOPROL-XL Take 1 tablet (50 mg total) by mouth daily. Take with or immediately following a meal. Start taking on: November 17, 2020   midodrine 10 MG tablet Commonly known as: PROAMATINE Take 1 tablet (10 mg total) by mouth 3 (three) times daily with meals.   montelukast 10 MG tablet Commonly known as: SINGULAIR Take 10 mg by mouth daily.   omeprazole 40 MG capsule Commonly known  as: PRILOSEC Take 40 mg by mouth daily.   Semaglutide 14 MG Tabs Take 14 mg by mouth daily.   tamsulosin 0.4 MG Caps capsule Commonly known as: FLOMAX Take 0.8 mg by mouth daily after breakfast.   Testosterone 20.25 MG/ACT (1.62%) Gel Place 1 application onto the skin daily. (Apply to shoulders)   topiramate 25 MG tablet Commonly known as: TOPAMAX Take 50 mg by mouth daily.   traZODone 50 MG tablet Commonly known as: DESYREL Take 50 mg by mouth at bedtime.        Follow-up Information     Callwood, Dwayne D, MD Follow up in 1 week(s).   Specialties: Cardiology, Internal Medicine Contact information: [place] [address] [phone]         Denton Lank, MD Follow up in 1 week(s).   Specialty: Family Medicine Contact information: [address] [place] [address] [phone]                Allergies  Allergen Reactions   Accupril [Quinapril Hcl] Anaphylaxis    Angioedema   Ace Inhibitors Anaphylaxis and Other (See Comments)  Angioedema Other reaction(s): Unknown Angioedema Angioedema Other reaction(s): Unknown Angioedema Angioedema Angioedema Angioedema     Consultations: Nephrology and cardiology   Procedures/Studies: CT ABDOMEN PELVIS WO CONTRAST  Result Date: 11/14/2020 CLINICAL DATA:  Left lower quadrant pain EXAM: CT ABDOMEN AND PELVIS WITHOUT CONTRAST TECHNIQUE: Multidetector CT imaging of the abdomen and pelvis was performed following the standard protocol without IV contrast. COMPARISON:  CT 07/09/2016 FINDINGS: Lower chest: Lung bases demonstrate atelectasis at the right base. No acute consolidation or effusion. Coronary vascular calcification. Hepatobiliary: No focal liver abnormality is seen. No gallstones, gallbladder wall thickening, or biliary dilatation. Pancreas: Unremarkable. No pancreatic ductal dilatation or surrounding inflammatory changes. Spleen: Normal in size without focal  abnormality. Adrenals/Urinary Tract: Adrenal glands are normal. Kidneys are atrophic. No hydronephrosis. Probable cyst upper pole left kidney. 2.3 cm slightly hyperdense lesion mid left kidney. The bladder is unremarkable Stomach/Bowel: Stomach is within normal limits. Appendix appears normal. No evidence of bowel wall thickening, distention, or inflammatory changes. Vascular/Lymphatic: Moderate aortic atherosclerosis. No aneurysm. No suspicious nodes. Reproductive: Prostate is unremarkable. Other: Negative for free air or free fluid Musculoskeletal: No acute osseous abnormality. Progression of bilateral SI joint arthropathy with sclerosis and lucency. Irregular endplate changes at S1, series 6, image 93, increased compared to prior. Facet degenerative changes at multiple levels. IMPRESSION: 1. No CT evidence for acute intra-abdominopelvic abnormality. 2. Atrophic kidneys. 2.3 cm hyperdense lesion within the mid left kidney, indeterminate. Consider correlation with ultrasound 3. Progression of bilateral SI joint arthropathy. Irregular endplate changes superiorly at S1. No corresponding paravertebral soft tissue abnormality. Correlate for any signs or symptoms of infection or back pain with follow-up MRI if indicated. Electronically Signed   By: Donavan Foil M.D.   On: 11/14/2020 15:30   VAS US DUPLEX DIALYSIS ACCESS (AVF, AVG)  Result Date: 10/30/2020 DIALYSIS ACCESS Patient Name:  Donald Fernandez  Date of Exam:   10/23/2020 Medical Rec #: [id number]         Accession #:    [id number] Date of Birth: [birth year]          Patient Gender: M Patient Age:   26Y Exam Location:  [facility] Procedure:      VAS US DUPLEX DIALYSIS ACCESS (AVF, AVG) Referring Phys: [id number] [place] --------------------------------------------------------------------------------  Reason for Exam: Routine follow up. Access Site: Left Upper Extremity. Access Type: WavLinq. History: 09/02/17: Left ulnar-ulnar AVF creation  using radiofrequency with          [place] system & coil embolization of left brachial vein &          radiocephalic AVF;          [id number]: Ulnar artery & anastomosis PTA;          [id number]: Left cephalic vein superficialization;. Comparison Study: 04/2020 Performing Technologist: Concha Norway RVT  Examination Guidelines: A complete evaluation includes B-mode imaging, spectral Doppler, color Doppler, and power Doppler as needed of all accessible portions of each vessel. Unilateral testing is considered an integral part of a complete examination. Limited examinations for reoccurring indications may be performed as noted.  Findings: +--------------------+----------+-----------------+--------+ AVF                 PSV (cm/s)Flow Vol (mL/min)Comments +--------------------+----------+-----------------+--------+ Native artery inflow   104          1366                +--------------------+----------+-----------------+--------+ AVF Anastomosis        144                              +--------------------+----------+-----------------+--------+  +------------+----------+-------------+----------+--------+  OUTFLOW VEINPSV (cm/s)Diameter (cm)Depth (cm)Describe +------------+----------+-------------+----------+--------+ Confluence     403                                    +------------+----------+-------------+----------+--------+ Shoulder       172                            stent   +------------+----------+-------------+----------+--------+ Prox UA        142                                    +------------+----------+-------------+----------+--------+ Mid UA          57                                    +------------+----------+-------------+----------+--------+ Dist UA         66        1.40                        +------------+----------+-------------+----------+--------+  +---------------+------------+----------+---------+--------+------------------+                   Diameter  Depth (cm)Branching  PSV      Flow Volume                        (cm)                        (cm/s)      (ml/min)      +---------------+------------+----------+---------+--------+------------------+ Left Rad Art                                     61                      Dis                                                                      +---------------+------------+----------+---------+--------+------------------+ Antegrade                                                                +---------------+------------+----------+---------+--------+------------------+  Summary: Patent left WavLinq AVF with no evidence of significant.  *See table(s) above for measurements and observations.  Diagnosing physician: Hortencia Pilar MD Electronically signed by Hortencia Pilar MD on 10/30/2020 at 5:10:13 PM.   --------------------------------------------------------------------------------   Final    ECHOCARDIOGRAM COMPLETE  Result Date: 11/14/2020    ECHOCARDIOGRAM REPORT   Patient Name:   Donald Fernandez Date of Exam: 11/14/2020 Medical Rec #:  [id number]        Height:  71.0 in Accession #:    [id number]       Weight:       315.3 lb Date of Birth:  16-Feb-1971         BSA:          2.558 m Patient Age:    36 years         BP:           137/105 mmHg Patient Gender: M                HR:           89 bpm. Exam Location:  [facility] Procedure: 2D Echo, Cardiac Doppler and Color Doppler Indications:     CHF-acute diastolic [id number]  History:         Patient has no prior history of Echocardiogram examinations.                  Risk Factors:Diabetes and Sleep Apnea.  Sonographer:     Sherrie Sport RDCS (AE) Referring Phys:  [place] Diagnosing Phys: Serafina Royals MD  Sonographer Comments: Technically difficult study due to poor echo windows, no apical window and no subcostal window. IMPRESSIONS  1. Left ventricular ejection fraction, by estimation, is 65 to 70%. The left  ventricle has normal function. The left ventricle has no regional wall motion abnormalities. Left ventricular diastolic parameters were normal.  2. Right ventricular systolic function is normal. The right ventricular size is normal.  3. The mitral valve is normal in structure. Mild mitral valve regurgitation.  4. The aortic valve is normal in structure. Aortic valve regurgitation is trivial. FINDINGS  Left Ventricle: Left ventricular ejection fraction, by estimation, is 65 to 70%. The left ventricle has normal function. The left ventricle has no regional wall motion abnormalities. The left ventricular internal cavity size was small. There is no left ventricular hypertrophy. Left ventricular diastolic parameters were normal. Right Ventricle: The right ventricular size is normal. No increase in right ventricular wall thickness. Right ventricular systolic function is normal. Left Atrium: Left atrial size was normal in size. Right Atrium: Right atrial size was normal in size. Pericardium: There is no evidence of pericardial effusion. Mitral Valve: The mitral valve is normal in structure. Mild mitral valve regurgitation. Tricuspid Valve: The tricuspid valve is normal in structure. Tricuspid valve regurgitation is mild. Aortic Valve: The aortic valve is normal in structure. Aortic valve regurgitation is trivial. Pulmonic Valve: The pulmonic valve was normal in structure. Pulmonic valve regurgitation is trivial. Aorta: The aortic root and ascending aorta are structurally normal, with no evidence of dilitation. IAS/Shunts: No atrial level shunt detected by color flow Doppler.  LEFT VENTRICLE PLAX 2D LVIDd:         4.24 cm LVIDs:         2.78 cm LV PW:         1.72 cm LV IVS:        1.50 cm  LEFT ATRIUM         Index LA diam:    3.40 cm 1.33 cm/m                        PULMONIC VALVE AORTA                 PV Vmax:        0.69 m/s Ao Root diam: 3.80 cm PV Peak grad:   1.9 mmHg  RVOT Peak grad: 2 mmHg   Serafina Royals MD Electronically signed by Serafina Royals MD Signature Date/Time: 11/14/2020/5:23:05 PM    Final       Subjective: Walked in the room this afternoon with no dizziness, lighheadedness. Bp low this am, but pt reported bp low on that arm always, asx, and bp on legs and other arm were higher.  Discharge Exam: Vitals:   11/16/20 1256 11/16/20 1300  BP: 114/80 116/83  Pulse: 91   Resp:    Temp:    SpO2:     Vitals:   11/16/20 0621 11/16/20 0734 11/16/20 1256 11/16/20 1300  BP: (!) 89/54 (!) 97/51 114/80 116/83  Pulse: 82 84 91   Resp: 16 19    Temp: 98.4 F (36.9 C) 97.8 F (36.6 C)    TempSrc: Oral     SpO2: 98% 99%    Weight:      Height:        General: Pt is alert, awake, not in acute distress Cardiovascular: RRR, S1/S2 +, no rubs, no gallops Respiratory: CTA bilaterally, no wheezing, no rhonchi Abdominal: Soft, NT, ND, bowel sounds + Extremities: no edema    The results of significant diagnostics from this hospitalization (including imaging, microbiology, ancillary and laboratory) are listed below for reference.     Microbiology: Recent Results (from the past 240 hour(s))  SARS CORONAVIRUS 2 (TAT 6-24 HRS) Nasopharyngeal Nasopharyngeal Swab     Status: None   Collection Time: 11/11/20  8:22 AM   Specimen: Nasopharyngeal Swab  Result Value Ref Range Status   SARS Coronavirus 2 NEGATIVE NEGATIVE Final    Comment: (NOTE) SARS-CoV-2 target nucleic acids are NOT DETECTED.  The SARS-CoV-2 RNA is generally detectable in upper and lower respiratory specimens during the acute phase of infection. Negative results do not preclude SARS-CoV-2 infection, do not rule out co-infections with other pathogens, and should not be used as the sole basis for treatment or other patient management decisions. Negative results must be combined with clinical observations, patient history, and epidemiological information. The expected result is Negative.  Fact Sheet for  Patients: SugarRoll.be  Fact Sheet for Healthcare Providers: https://www.woods-mathews.com/  This test is not yet approved or cleared by the [place] FDA and  has been authorized for detection and/or diagnosis of SARS-CoV-2 by FDA under an Emergency Use Authorization (EUA). This EUA will remain  in effect (meaning this test can be used) for the duration of the COVID-19 declaration under Se ction 564(b)(1) of the Act, 21 U.S.C. section 360bbb-3(b)(1), unless the authorization is terminated or revoked sooner.  Performed at [facility], [place] [address], [place], [place] [address]   Resp Panel by RT-PCR (Flu A&B, Covid) Nasopharyngeal Swab     Status: None   Collection Time: 11/14/20  6:47 AM   Specimen: Nasopharyngeal Swab; Nasopharyngeal(NP) swabs in vial transport medium  Result Value Ref Range Status   SARS Coronavirus 2 by RT PCR NEGATIVE NEGATIVE Final    Comment: (NOTE) SARS-CoV-2 target nucleic acids are NOT DETECTED.  The SARS-CoV-2 RNA is generally detectable in upper respiratory specimens during the acute phase of infection. The lowest concentration of SARS-CoV-2 viral copies this assay can detect is 138 copies/mL. A negative result does not preclude SARS-Cov-2 infection and should not be used as the sole basis for treatment or other patient management decisions. A negative result may occur with  improper specimen collection/handling, submission of specimen other than nasopharyngeal swab, presence of viral mutation(s) within the  areas targeted by this assay, and inadequate number of viral copies(<138 copies/mL). A negative result must be combined with clinical observations, patient history, and epidemiological information. The expected result is Negative.  Fact Sheet for Patients:  EntrepreneurPulse.com.au  Fact Sheet for Healthcare Providers:  IncredibleEmployment.be  This test is  no t yet approved or cleared by the [place] FDA and  has been authorized for detection and/or diagnosis of SARS-CoV-2 by FDA under an Emergency Use Authorization (EUA). This EUA will remain  in effect (meaning this test can be used) for the duration of the COVID-19 declaration under Section 564(b)(1) of the Act, 21 U.S.C.section 360bbb-3(b)(1), unless the authorization is terminated  or revoked sooner.       Influenza A by PCR NEGATIVE NEGATIVE Final   Influenza B by PCR NEGATIVE NEGATIVE Final    Comment: (NOTE) The Xpert Xpress SARS-CoV-2/FLU/RSV plus assay is intended as an aid in the diagnosis of influenza from Nasopharyngeal swab specimens and should not be used as a sole basis for treatment. Nasal washings and aspirates are unacceptable for Xpert Xpress SARS-CoV-2/FLU/RSV testing.  Fact Sheet for Patients: EntrepreneurPulse.com.au  Fact Sheet for Healthcare Providers: IncredibleEmployment.be  This test is not yet approved or cleared by the [place] FDA and has been authorized for detection and/or diagnosis of SARS-CoV-2 by FDA under an Emergency Use Authorization (EUA). This EUA will remain in effect (meaning this test can be used) for the duration of the COVID-19 declaration under Section 564(b)(1) of the Act, 21 U.S.C. section 360bbb-3(b)(1), unless the authorization is terminated or revoked.  Performed at [facility], [address] [place], [place] [address]      Labs: BNP (last 3 results) Recent Labs    11/14/20 0521  BNP 9.5   Basic Metabolic Panel: Recent Labs  Lab 11/14/20 0521 11/14/20 1440 11/15/20 0525 11/16/20 0615  NA 136  --  134* 131*  K 5.0  --  5.5* 5.4*  CL 95*  --  92* 91*  CO2 26  --  25 25  GLUCOSE 115*  --  122* 113*  BUN 61*  --  61* 56*  CREATININE 17.48* 14.54* 17.94* 16.21*  CALCIUM 9.3  --  9.0 8.9  MG 2.6*  --   --   --    Liver Function Tests: Recent Labs  Lab  11/14/20 0521  AST 12*  ALT 13  ALKPHOS 152*  BILITOT 0.7  PROT 7.6  ALBUMIN 3.9   No results for input(s): LIPASE, AMYLASE in the last 168 hours. No results for input(s): AMMONIA in the last 168 hours. CBC: Recent Labs  Lab 11/14/20 0521 11/15/20 0525  WBC 7.7 6.2  NEUTROABS 4.4  --   HGB 13.4 12.7*  HCT 41.0 39.1  MCV 99.5 97.3  PLT 234 225   Cardiac Enzymes: No results for input(s): CKTOTAL, CKMB, CKMBINDEX, TROPONINI in the last 168 hours. BNP: Invalid input(s): POCBNP CBG: Recent Labs  Lab 11/15/20 1321 11/15/20 1815 11/15/20 2020 11/16/20 0732 11/16/20 1115  GLUCAP 120* 147* 152* 118* 118*   D-Dimer No results for input(s): DDIMER in the last 72 hours. Hgb A1c Recent Labs    11/14/20 1440  HGBA1C 5.9*   Lipid Profile No results for input(s): CHOL, HDL, LDLCALC, TRIG, CHOLHDL, LDLDIRECT in the last 72 hours. Thyroid function studies No results for input(s): TSH, T4TOTAL, T3FREE, THYROIDAB in the last 72 hours.  Invalid input(s): FREET3 Anemia work up No results for input(s): VITAMINB12, FOLATE, FERRITIN, TIBC, IRON,  RETICCTPCT in the last 72 hours. Urinalysis    Component Value Date/Time   COLORURINE YELLOW (A) 07/09/2016 1215   APPEARANCEUR CLEAR (A) 07/09/2016 1215   LABSPEC 1.009 07/09/2016 1215   PHURINE 6.0 07/09/2016 1215   GLUCOSEU NEGATIVE 07/09/2016 1215   HGBUR NEGATIVE 07/09/2016 1215   BILIRUBINUR NEGATIVE 07/09/2016 1215   KETONESUR NEGATIVE 07/09/2016 1215   PROTEINUR 100 (A) 07/09/2016 1215   NITRITE NEGATIVE 07/09/2016 1215   LEUKOCYTESUR TRACE (A) 07/09/2016 1215   Sepsis Labs Invalid input(s): PROCALCITONIN,  WBC,  LACTICIDVEN Microbiology Recent Results (from the past 240 hour(s))  SARS CORONAVIRUS 2 (TAT 6-24 HRS) Nasopharyngeal Nasopharyngeal Swab     Status: None   Collection Time: 11/11/20  8:22 AM   Specimen: Nasopharyngeal Swab  Result Value Ref Range Status   SARS Coronavirus 2 NEGATIVE NEGATIVE Final     Comment: (NOTE) SARS-CoV-2 target nucleic acids are NOT DETECTED.  The SARS-CoV-2 RNA is generally detectable in upper and lower respiratory specimens during the acute phase of infection. Negative results do not preclude SARS-CoV-2 infection, do not rule out co-infections with other pathogens, and should not be used as the sole basis for treatment or other patient management decisions. Negative results must be combined with clinical observations, patient history, and epidemiological information. The expected result is Negative.  Fact Sheet for Patients: SugarRoll.be  Fact Sheet for Healthcare Providers: https://www.woods-mathews.com/  This test is not yet approved or cleared by the [place] FDA and  has been authorized for detection and/or diagnosis of SARS-CoV-2 by FDA under an Emergency Use Authorization (EUA). This EUA will remain  in effect (meaning this test can be used) for the duration of the COVID-19 declaration under Se ction 564(b)(1) of the Act, 21 U.S.C. section 360bbb-3(b)(1), unless the authorization is terminated or revoked sooner.  Performed at [facility], [place] [address], [place], [place] [address]   Resp Panel by RT-PCR (Flu A&B, Covid) Nasopharyngeal Swab     Status: None   Collection Time: 11/14/20  6:47 AM   Specimen: Nasopharyngeal Swab; Nasopharyngeal(NP) swabs in vial transport medium  Result Value Ref Range Status   SARS Coronavirus 2 by RT PCR NEGATIVE NEGATIVE Final    Comment: (NOTE) SARS-CoV-2 target nucleic acids are NOT DETECTED.  The SARS-CoV-2 RNA is generally detectable in upper respiratory specimens during the acute phase of infection. The lowest concentration of SARS-CoV-2 viral copies this assay can detect is 138 copies/mL. A negative result does not preclude SARS-Cov-2 infection and should not be used as the sole basis for treatment or other patient management decisions. A negative  result may occur with  improper specimen collection/handling, submission of specimen other than nasopharyngeal swab, presence of viral mutation(s) within the areas targeted by this assay, and inadequate number of viral copies(<138 copies/mL). A negative result must be combined with clinical observations, patient history, and epidemiological information. The expected result is Negative.  Fact Sheet for Patients:  EntrepreneurPulse.com.au  Fact Sheet for Healthcare Providers:  IncredibleEmployment.be  This test is no t yet approved or cleared by the [place] FDA and  has been authorized for detection and/or diagnosis of SARS-CoV-2 by FDA under an Emergency Use Authorization (EUA). This EUA will remain  in effect (meaning this test can be used) for the duration of the COVID-19 declaration under Section 564(b)(1) of the Act, 21 U.S.C.section 360bbb-3(b)(1), unless the authorization is terminated  or revoked sooner.       Influenza A by PCR NEGATIVE NEGATIVE Final  Influenza B by PCR NEGATIVE NEGATIVE Final    Comment: (NOTE) The Xpert Xpress SARS-CoV-2/FLU/RSV plus assay is intended as an aid in the diagnosis of influenza from Nasopharyngeal swab specimens and should not be used as a sole basis for treatment. Nasal washings and aspirates are unacceptable for Xpert Xpress SARS-CoV-2/FLU/RSV testing.  Fact Sheet for Patients: EntrepreneurPulse.com.au  Fact Sheet for Healthcare Providers: IncredibleEmployment.be  This test is not yet approved or cleared by the [place] FDA and has been authorized for detection and/or diagnosis of SARS-CoV-2 by FDA under an Emergency Use Authorization (EUA). This EUA will remain in effect (meaning this test can be used) for the duration of the COVID-19 declaration under Section 564(b)(1) of the Act, 21 U.S.C. section 360bbb-3(b)(1), unless the authorization is  terminated or revoked.  Performed at [facility], [address], [place], [place] [address]      Time coordinating discharge: Over 30 minutes  SIGNED:   Nolberto Hanlon, MD  [facility] 11/16/2020, 4:59 PM Pager   If 7PM-7AM, please contact night-coverage [email or web address] Password [email or web address]

## 2020-11-16 NOTE — Progress Notes (Signed)
[facility] Encounter Note  Patient: Donald Fernandez / Admit Date: 11/14/2020 / Date of Encounter: 11/16/2020, 8:24 AM   Subjective: 8/6.  Patient is feeling well today and has not had any episodes of irregularity of the heartbeat or tachycardia since admission.  Patient tolerated dialysis without difficulty.  Patient has multiple different types of issues and concerns including dizziness weakness and hypotension after dialysis, irregular heartbeat and runs of irregular heartbeat.  and weakness.  The patient did have an evaluation of sleep study with long run of wide-complex tachycardia of unknown etiology but nonsustained.  8/7.  Patient tolerated dialysis well although continuing to have concerns of low blood pressure at the end of dialysis.  Patient is dizzy and weak.  This may need further address as per nephrology.  No evidence of rhythm disturbances since the patient has been admitted to the hospital with telemetry continuing to show normal sinus rhythm.  Adjustments of medication management including discontinuation of amlodipine for concerns of hypotension and increasing dosages or changing to different beta-blocker of metoprolol for treatment of possible rapid rate and palpitations.  Patient has ambulated with no evidence of concerns of rapid rate at this time.  Echocardiogram showing normal LV systolic function ejection fraction of 70% and no evidence of significant valvular heart disease  Review of Systems: Positive for: Shortness of breath weakness Negative for: Vision change, hearing change, syncope, dizziness, nausea, vomiting,diarrhea, bloody stool, stomach pain, cough, congestion, diaphoresis, urinary frequency, urinary pain,skin lesions, skin rashes Others previously listed  Objective: Telemetry: Normal sinus rhythm Physical Exam: Blood pressure (!) 97/51, pulse 84, temperature 97.8 F (36.6 C), resp. rate 19, height '5\' 11"'$  (1.803 m), weight (!) 143.7 kg,  SpO2 99 %. Body mass index is 44.18 kg/m. General: Well developed, well nourished, in no acute distress. Head: Normocephalic, atraumatic, sclera non-icteric, no xanthomas, nares are without discharge. Neck: No apparent masses Lungs: Normal respirations with no wheezes, no rhonchi, no rales , no crackles   Heart: Regular rate and rhythm, normal S1 S2, no murmur, no rub, no gallop, PMI is normal size and placement, carotid upstroke normal without bruit, jugular venous pressure normal Abdomen: Soft, non-tender, non-distended with normoactive bowel sounds. No hepatosplenomegaly. Abdominal aorta is normal size without bruit Extremities: No edema, no clubbing, no cyanosis, no ulcers,  Peripheral: 2+ radial, 2+ femoral, 2+ dorsal pedal pulses Neuro: Alert and oriented. Moves all extremities spontaneously. Psych:  Responds to questions appropriately with a normal affect.   Intake/Output Summary (Last 24 hours) at 11/16/2020 0824 Last data filed at 11/15/2020 1715 Gross per 24 hour  Intake --  Output 2716 ml  Net -2716 ml     Inpatient Medications:   anastrozole  0.5 mg Oral Daily   aspirin EC  81 mg Oral Daily   atorvastatin  20 mg Oral Daily   calcium acetate  2,668 mg Oral TID WC   Chlorhexidine Gluconate Cloth  6 each Topical Daily   cinacalcet  90 mg Oral Once per day on Mon Wed Fri   clopidogrel  75 mg Oral Daily   furosemide  40 mg Oral Daily   gabapentin  100 mg Oral TID   heparin  5,000 Units Subcutaneous Q8H   insulin aspart  0-6 Units Subcutaneous TID WC   metoprolol succinate  50 mg Oral Daily   montelukast  10 mg Oral Daily   pantoprazole  40 mg Oral Daily   sodium chloride flush  3 mL Intravenous Q12H  sodium chloride flush  3 mL Intravenous Q12H   tamsulosin  0.8 mg Oral QPC breakfast   Infusions:   sodium chloride      Labs: Recent Labs    11/14/20 0521 11/14/20 1440 11/15/20 0525 11/16/20 0615  NA 136  --  134* 131*  K 5.0  --  5.5* 5.4*  CL 95*  --  92*  91*  CO2 26  --  25 25  GLUCOSE 115*  --  122* 113*  BUN 61*  --  61* 56*  CREATININE 17.48*   < > 17.94* 16.21*  CALCIUM 9.3  --  9.0 8.9  MG 2.6*  --   --   --    < > = values in this interval not displayed.    Recent Labs    11/14/20 0521  AST 12*  ALT 13  ALKPHOS 152*  BILITOT 0.7  PROT 7.6  ALBUMIN 3.9    Recent Labs    11/14/20 0521 11/15/20 0525  WBC 7.7 6.2  NEUTROABS 4.4  --   HGB 13.4 12.7*  HCT 41.0 39.1  MCV 99.5 97.3  PLT 234 225    No results for input(s): CKTOTAL, CKMB, TROPONINI in the last 72 hours. Invalid input(s): POCBNP Recent Labs    11/14/20 1440  HGBA1C 5.9*      Weights: Filed Weights   11/14/20 0437 11/14/20 2015 11/16/20 0600  Weight: (!) 143 kg (!) 143 kg (!) 143.7 kg     Radiology/Studies:  CT ABDOMEN PELVIS WO CONTRAST  Result Date: 11/14/2020 CLINICAL DATA:  Left lower quadrant pain EXAM: CT ABDOMEN AND PELVIS WITHOUT CONTRAST TECHNIQUE: Multidetector CT imaging of the abdomen and pelvis was performed following the standard protocol without IV contrast. COMPARISON:  CT 07/09/2016 FINDINGS: Lower chest: Lung bases demonstrate atelectasis at the right base. No acute consolidation or effusion. Coronary vascular calcification. Hepatobiliary: No focal liver abnormality is seen. No gallstones, gallbladder wall thickening, or biliary dilatation. Pancreas: Unremarkable. No pancreatic ductal dilatation or surrounding inflammatory changes. Spleen: Normal in size without focal abnormality. Adrenals/Urinary Tract: Adrenal glands are normal. Kidneys are atrophic. No hydronephrosis. Probable cyst upper pole left kidney. 2.3 cm slightly hyperdense lesion mid left kidney. The bladder is unremarkable Stomach/Bowel: Stomach is within normal limits. Appendix appears normal. No evidence of bowel wall thickening, distention, or inflammatory changes. Vascular/Lymphatic: Moderate aortic atherosclerosis. No aneurysm. No suspicious nodes. Reproductive:  Prostate is unremarkable. Other: Negative for free air or free fluid Musculoskeletal: No acute osseous abnormality. Progression of bilateral SI joint arthropathy with sclerosis and lucency. Irregular endplate changes at S1, series 6, image 93, increased compared to prior. Facet degenerative changes at multiple levels. IMPRESSION: 1. No CT evidence for acute intra-abdominopelvic abnormality. 2. Atrophic kidneys. 2.3 cm hyperdense lesion within the mid left kidney, indeterminate. Consider correlation with ultrasound 3. Progression of bilateral SI joint arthropathy. Irregular endplate changes superiorly at S1. No corresponding paravertebral soft tissue abnormality. Correlate for any signs or symptoms of infection or back pain with follow-up MRI if indicated. Electronically Signed   By: Donavan Foil M.D.   On: 11/14/2020 15:30   VAS US DUPLEX DIALYSIS ACCESS (AVF, AVG)  Result Date: 10/30/2020 DIALYSIS ACCESS Patient Name:  MARKIES OSBEY  Date of Exam:   10/23/2020 Medical Rec #: [id number]         Accession #:    [id number] Date of Birth: March 13, 1971          Patient Gender: M Patient Age:  78Y Exam Location:  [facility] Procedure:      VAS US DUPLEX DIALYSIS ACCESS (AVF, AVG) Referring Phys: [id number] [place] --------------------------------------------------------------------------------  Reason for Exam: Routine follow up. Access Site: Left Upper Extremity. Access Type: WavLinq. History: 09/02/17: Left ulnar-ulnar AVF creation using radiofrequency with          [place] system & coil embolization of left brachial vein &          radiocephalic AVF;          [id number]: Ulnar artery & anastomosis PTA;          [id number]: Left cephalic vein superficialization;. Comparison Study: 04/2020 Performing Technologist: Concha Norway RVT  Examination Guidelines: A complete evaluation includes B-mode imaging, spectral Doppler, color Doppler, and power Doppler as needed of all accessible portions of each vessel.  Unilateral testing is considered an integral part of a complete examination. Limited examinations for reoccurring indications may be performed as noted.  Findings: +--------------------+----------+-----------------+--------+ AVF                 PSV (cm/s)Flow Vol (mL/min)Comments +--------------------+----------+-----------------+--------+ Native artery inflow   104          1366                +--------------------+----------+-----------------+--------+ AVF Anastomosis        144                              +--------------------+----------+-----------------+--------+  +------------+----------+-------------+----------+--------+ OUTFLOW VEINPSV (cm/s)Diameter (cm)Depth (cm)Describe +------------+----------+-------------+----------+--------+ Confluence     403                                    +------------+----------+-------------+----------+--------+ Shoulder       172                            stent   +------------+----------+-------------+----------+--------+ Prox UA        142                                    +------------+----------+-------------+----------+--------+ Mid UA          57                                    +------------+----------+-------------+----------+--------+ Dist UA         66        1.40                        +------------+----------+-------------+----------+--------+  +---------------+------------+----------+---------+--------+------------------+                  Diameter  Depth (cm)Branching  PSV      Flow Volume                        (cm)                        (cm/s)      (ml/min)      +---------------+------------+----------+---------+--------+------------------+ Left Rad Art  61                      Dis                                                                      +---------------+------------+----------+---------+--------+------------------+ Antegrade                                                                 +---------------+------------+----------+---------+--------+------------------+  Summary: Patent left WavLinq AVF with no evidence of significant.  *See table(s) above for measurements and observations.  Diagnosing physician: Hortencia Pilar MD Electronically signed by Hortencia Pilar MD on 10/30/2020 at 5:10:13 PM.   --------------------------------------------------------------------------------   Final    ECHOCARDIOGRAM COMPLETE  Result Date: 11/14/2020    ECHOCARDIOGRAM REPORT   Patient Name:   [name] Date of Exam: 11/14/2020 Medical Rec #:  [id number]        Height:       71.0 in Accession #:    [id number]       Weight:       315.3 lb Date of Birth:  1970/12/07         BSA:          2.558 m Patient Age:    50 years         BP:           137/105 mmHg Patient Gender: M                HR:           89 bpm. Exam Location:  [facility] Procedure: 2D Echo, Cardiac Doppler and Color Doppler Indications:     CHF-acute diastolic [id number]  History:         Patient has no prior history of Echocardiogram examinations.                  Risk Factors:Diabetes and Sleep Apnea.  Sonographer:     Sherrie Sport RDCS (AE) Referring Phys:  [place] Diagnosing Phys: Serafina Royals MD  Sonographer Comments: Technically difficult study due to poor echo windows, no apical window and no subcostal window. IMPRESSIONS  1. Left ventricular ejection fraction, by estimation, is 65 to 70%. The left ventricle has normal function. The left ventricle has no regional wall motion abnormalities. Left ventricular diastolic parameters were normal.  2. Right ventricular systolic function is normal. The right ventricular size is normal.  3. The mitral valve is normal in structure. Mild mitral valve regurgitation.  4. The aortic valve is normal in structure. Aortic valve regurgitation is trivial. FINDINGS  Left Ventricle: Left ventricular ejection fraction, by estimation, is 65  to 70%. The left ventricle has normal function. The left ventricle has no regional wall motion abnormalities. The left ventricular internal cavity size was small. There is no left ventricular hypertrophy. Left ventricular diastolic parameters were normal. Right Ventricle: The right ventricular size is normal. No increase in right ventricular wall thickness. Right ventricular systolic function is normal.  Left Atrium: Left atrial size was normal in size. Right Atrium: Right atrial size was normal in size. Pericardium: There is no evidence of pericardial effusion. Mitral Valve: The mitral valve is normal in structure. Mild mitral valve regurgitation. Tricuspid Valve: The tricuspid valve is normal in structure. Tricuspid valve regurgitation is mild. Aortic Valve: The aortic valve is normal in structure. Aortic valve regurgitation is trivial. Pulmonic Valve: The pulmonic valve was normal in structure. Pulmonic valve regurgitation is trivial. Aorta: The aortic root and ascending aorta are structurally normal, with no evidence of dilitation. IAS/Shunts: No atrial level shunt detected by color flow Doppler.  LEFT VENTRICLE PLAX 2D LVIDd:         4.24 cm LVIDs:         2.78 cm LV PW:         1.72 cm LV IVS:        1.50 cm  LEFT ATRIUM         Index LA diam:    3.40 cm 1.33 cm/m                        PULMONIC VALVE AORTA                 PV Vmax:        0.69 m/s Ao Root diam: 3.80 cm PV Peak grad:   1.9 mmHg                       RVOT Peak grad: 2 mmHg  Serafina Royals MD Electronically signed by Serafina Royals MD Signature Date/Time: 11/14/2020/5:23:05 PM    Final      Assessment and Recommendation  50 y.o. male with known chronic kidney disease stage V with weakness fatigue and irregular heartbeat multifactorial in nature with normal LV function and no evidence of valvular heart disease myocardial infarction or congestive heart failure having short runs of nonsustained wide-complex tachycardia likely secondary to  supraventricular tachycardia with bundle branch block needing further medical management 1.  Continuation of metoprolol at current dose which appears to be working quite well and no evidence of rhythm disturbances 2.  Continuation of ambulation and dialysis without restriction  3.  If no rhythm disturbances occur with above will be okay for discharged home with further cardiac follow-up next week for further adjustments of medication management including the possibility of adjustments of medications as well as addition of Holter monitor for further evaluation of recurrence of rhythm disturbances and treatment options 4.  No further cardiac diagnostics necessary at this time 5.  Call if further questions  Signed, Serafina Royals M.D. FACC

## 2020-11-16 NOTE — Progress Notes (Addendum)
[facility]  ROUNDING NOTE   Subjective:   [name] was admitted to [facility] on 11/14/2020 for Ventricular tachycardia [facility]) [I47.2] Ventricular tachycardia (paroxysmal) ([place]) [I47.2]  Last hemodialysis treatment was Wednesday, he completed 3 hours of his treatment.   Patient was doing an overnight sleep study when he started having runs of V-tach. Patient was sent to the ED. Patient was brought for dialysis but was unable to tolerate treatment.   Patient seen laying in bed Alert States he was dizzy and weak after dialysis Has not gotten out of bed today Concerned about going home with this  Continues to complain of lower back pain   Objective:  Vital signs in last 24 hours:  Temp:  [97.7 F (36.5 C)-98.4 F (36.9 C)] 97.8 F (36.6 C) (08/07 0734) Pulse Rate:  [54-94] 91 (08/07 1256) Resp:  [16-26] 19 (08/07 0734) BP: (66-161)/(35-108) 116/83 (08/07 1300) SpO2:  [78 %-100 %] 99 % (08/07 0734) Weight:  [143.7 kg] 143.7 kg (08/07 0600)  Weight change: 0.7 kg Filed Weights   11/14/20 0437 11/14/20 2015 11/16/20 0600  Weight: (!) 143 kg (!) 143 kg (!) 143.7 kg    Intake/Output: I/O last 3 completed shifts: In: -  Out: 2716 [Urine:1200; Other:1516]   Intake/Output this shift:  No intake/output data recorded.  Physical Exam: General: NAD, laying in bed  Head: Normocephalic, atraumatic. Moist oral mucosal membranes  Eyes: Anicteric  Lungs:  Clear to auscultation, normal effort, Cpap hs  Heart: Regular rate and rhythm  Abdomen:  Soft, nontender  Extremities:  No peripheral edema.  Neurologic: Nonfocal, moving all four extremities  Skin: No lesions  Access: Left AVF    Basic Metabolic Panel: Recent Labs  Lab 11/14/20 0521 11/14/20 1440 11/15/20 0525 11/16/20 0615  NA 136  --  134* 131*  K 5.0  --  5.5* 5.4*  CL 95*  --  92* 91*  CO2 26  --  25 25  GLUCOSE 115*  --  122* 113*  BUN 61*  --  61* 56*  CREATININE 17.48* 14.54* 17.94*  16.21*  CALCIUM 9.3  --  9.0 8.9  MG 2.6*  --   --   --      Liver Function Tests: Recent Labs  Lab 11/14/20 0521  AST 12*  ALT 13  ALKPHOS 152*  BILITOT 0.7  PROT 7.6  ALBUMIN 3.9    No results for input(s): LIPASE, AMYLASE in the last 168 hours. No results for input(s): AMMONIA in the last 168 hours.  CBC: Recent Labs  Lab 11/14/20 0521 11/15/20 0525  WBC 7.7 6.2  NEUTROABS 4.4  --   HGB 13.4 12.7*  HCT 41.0 39.1  MCV 99.5 97.3  PLT 234 225     Cardiac Enzymes: No results for input(s): CKTOTAL, CKMB, CKMBINDEX, TROPONINI in the last 168 hours.  BNP: Invalid input(s): POCBNP  CBG: Recent Labs  Lab 11/15/20 1321 11/15/20 1815 11/15/20 2020 11/16/20 0732 11/16/20 1115  GLUCAP 120* 147* 152* 118* 118*     Microbiology: Results for orders placed or performed during the hospital encounter of 11/14/20  Resp Panel by RT-PCR (Flu A&B, Covid) Nasopharyngeal Swab     Status: None   Collection Time: 11/14/20  6:47 AM   Specimen: Nasopharyngeal Swab; Nasopharyngeal(NP) swabs in vial transport medium  Result Value Ref Range Status   SARS Coronavirus 2 by RT PCR NEGATIVE NEGATIVE Final    Comment: (NOTE) SARS-CoV-2 target nucleic acids are NOT DETECTED.  The SARS-CoV-2 RNA is generally detectable in upper respiratory specimens during the acute phase of infection. The lowest concentration of SARS-CoV-2 viral copies this assay can detect is 138 copies/mL. A negative result does not preclude SARS-Cov-2 infection and should not be used as the sole basis for treatment or other patient management decisions. A negative result may occur with  improper specimen collection/handling, submission of specimen other than nasopharyngeal swab, presence of viral mutation(s) within the areas targeted by this assay, and inadequate number of viral copies(<138 copies/mL). A negative result must be combined with clinical observations, patient history, and  epidemiological information. The expected result is Negative.  Fact Sheet for Patients:  EntrepreneurPulse.com.au  Fact Sheet for Healthcare Providers:  IncredibleEmployment.be  This test is no t yet approved or cleared by the [place] FDA and  has been authorized for detection and/or diagnosis of SARS-CoV-2 by FDA under an Emergency Use Authorization (EUA). This EUA will remain  in effect (meaning this test can be used) for the duration of the COVID-19 declaration under Section 564(b)(1) of the Act, 21 U.S.C.section 360bbb-3(b)(1), unless the authorization is terminated  or revoked sooner.       Influenza A by PCR NEGATIVE NEGATIVE Final   Influenza B by PCR NEGATIVE NEGATIVE Final    Comment: (NOTE) The Xpert Xpress SARS-CoV-2/FLU/RSV plus assay is intended as an aid in the diagnosis of influenza from Nasopharyngeal swab specimens and should not be used as a sole basis for treatment. Nasal washings and aspirates are unacceptable for Xpert Xpress SARS-CoV-2/FLU/RSV testing.  Fact Sheet for Patients: EntrepreneurPulse.com.au  Fact Sheet for Healthcare Providers: IncredibleEmployment.be  This test is not yet approved or cleared by the [place] FDA and has been authorized for detection and/or diagnosis of SARS-CoV-2 by FDA under an Emergency Use Authorization (EUA). This EUA will remain in effect (meaning this test can be used) for the duration of the COVID-19 declaration under Section 564(b)(1) of the Act, 21 U.S.C. section 360bbb-3(b)(1), unless the authorization is terminated or revoked.  Performed at [facility], [address] [place], [place] [address]     Coagulation Studies: No results for input(s): LABPROT, INR in the last 72 hours.  Urinalysis: No results for input(s): COLORURINE, LABSPEC, PHURINE, GLUCOSEU, HGBUR, BILIRUBINUR, KETONESUR, PROTEINUR, UROBILINOGEN,  NITRITE, LEUKOCYTESUR in the last 72 hours.  Invalid input(s): APPERANCEUR    Imaging: CT ABDOMEN PELVIS WO CONTRAST  Result Date: 11/14/2020 CLINICAL DATA:  Left lower quadrant pain EXAM: CT ABDOMEN AND PELVIS WITHOUT CONTRAST TECHNIQUE: Multidetector CT imaging of the abdomen and pelvis was performed following the standard protocol without IV contrast. COMPARISON:  CT 07/09/2016 FINDINGS: Lower chest: Lung bases demonstrate atelectasis at the right base. No acute consolidation or effusion. Coronary vascular calcification. Hepatobiliary: No focal liver abnormality is seen. No gallstones, gallbladder wall thickening, or biliary dilatation. Pancreas: Unremarkable. No pancreatic ductal dilatation or surrounding inflammatory changes. Spleen: Normal in size without focal abnormality. Adrenals/Urinary Tract: Adrenal glands are normal. Kidneys are atrophic. No hydronephrosis. Probable cyst upper pole left kidney. 2.3 cm slightly hyperdense lesion mid left kidney. The bladder is unremarkable Stomach/Bowel: Stomach is within normal limits. Appendix appears normal. No evidence of bowel wall thickening, distention, or inflammatory changes. Vascular/Lymphatic: Moderate aortic atherosclerosis. No aneurysm. No suspicious nodes. Reproductive: Prostate is unremarkable. Other: Negative for free air or free fluid Musculoskeletal: No acute osseous abnormality. Progression of bilateral SI joint arthropathy with sclerosis and lucency. Irregular endplate changes at S1, series 6, image 93, increased compared to prior.  Facet degenerative changes at multiple levels. IMPRESSION: 1. No CT evidence for acute intra-abdominopelvic abnormality. 2. Atrophic kidneys. 2.3 cm hyperdense lesion within the mid left kidney, indeterminate. Consider correlation with ultrasound 3. Progression of bilateral SI joint arthropathy. Irregular endplate changes superiorly at S1. No corresponding paravertebral soft tissue abnormality. Correlate for any  signs or symptoms of infection or back pain with follow-up MRI if indicated. Electronically Signed   By: Donavan Foil M.D.   On: 11/14/2020 15:30   ECHOCARDIOGRAM COMPLETE  Result Date: 11/14/2020    ECHOCARDIOGRAM REPORT   Patient Name:   Donald Fernandez Date of Exam: 11/14/2020 Medical Rec #:  [id number]        Height:       71.0 in Accession #:    [id number]       Weight:       315.3 lb Date of Birth:  08/14/1970         BSA:          2.558 m Patient Age:    1 years         BP:           137/105 mmHg Patient Gender: M                HR:           89 bpm. Exam Location:  [facility] Procedure: 2D Echo, Cardiac Doppler and Color Doppler Indications:     CHF-acute diastolic [id number]  History:         Patient has no prior history of Echocardiogram examinations.                  Risk Factors:Diabetes and Sleep Apnea.  Sonographer:     Sherrie Sport RDCS (AE) Referring Phys:  [place] Diagnosing Phys: Serafina Royals MD  Sonographer Comments: Technically difficult study due to poor echo windows, no apical window and no subcostal window. IMPRESSIONS  1. Left ventricular ejection fraction, by estimation, is 65 to 70%. The left ventricle has normal function. The left ventricle has no regional wall motion abnormalities. Left ventricular diastolic parameters were normal.  2. Right ventricular systolic function is normal. The right ventricular size is normal.  3. The mitral valve is normal in structure. Mild mitral valve regurgitation.  4. The aortic valve is normal in structure. Aortic valve regurgitation is trivial. FINDINGS  Left Ventricle: Left ventricular ejection fraction, by estimation, is 65 to 70%. The left ventricle has normal function. The left ventricle has no regional wall motion abnormalities. The left ventricular internal cavity size was small. There is no left ventricular hypertrophy. Left ventricular diastolic parameters were normal. Right Ventricle: The right ventricular size is normal. No increase in  right ventricular wall thickness. Right ventricular systolic function is normal. Left Atrium: Left atrial size was normal in size. Right Atrium: Right atrial size was normal in size. Pericardium: There is no evidence of pericardial effusion. Mitral Valve: The mitral valve is normal in structure. Mild mitral valve regurgitation. Tricuspid Valve: The tricuspid valve is normal in structure. Tricuspid valve regurgitation is mild. Aortic Valve: The aortic valve is normal in structure. Aortic valve regurgitation is trivial. Pulmonic Valve: The pulmonic valve was normal in structure. Pulmonic valve regurgitation is trivial. Aorta: The aortic root and ascending aorta are structurally normal, with no evidence of dilitation. IAS/Shunts: No atrial level shunt detected by color flow Doppler.  LEFT VENTRICLE PLAX 2D LVIDd:  4.24 cm LVIDs:         2.78 cm LV PW:         1.72 cm LV IVS:        1.50 cm  LEFT ATRIUM         Index LA diam:    3.40 cm 1.33 cm/m                        PULMONIC VALVE AORTA                 PV Vmax:        0.69 m/s Ao Root diam: 3.80 cm PV Peak grad:   1.9 mmHg                       RVOT Peak grad: 2 mmHg  Serafina Royals MD Electronically signed by Serafina Royals MD Signature Date/Time: 11/14/2020/5:23:05 PM    Final      Medications:    sodium chloride      anastrozole  0.5 mg Oral Daily   aspirin EC  81 mg Oral Daily   atorvastatin  20 mg Oral Daily   calcium acetate  2,668 mg Oral TID WC   Chlorhexidine Gluconate Cloth  6 each Topical Daily   cinacalcet  90 mg Oral Once per day on Mon Wed Fri   clopidogrel  75 mg Oral Daily   gabapentin  100 mg Oral TID   heparin  5,000 Units Subcutaneous Q8H   insulin aspart  0-6 Units Subcutaneous TID WC   metoprolol succinate  50 mg Oral Daily   midodrine  10 mg Oral TID WC   montelukast  10 mg Oral Daily   pantoprazole  40 mg Oral Daily   sodium chloride flush  3 mL Intravenous Q12H   sodium chloride flush  3 mL Intravenous Q12H    tamsulosin  0.8 mg Oral QPC breakfast   sodium chloride, acetaminophen **OR** acetaminophen, docusate sodium, fluticasone, ibuprofen, ondansetron **OR** ondansetron (ZOFRAN) IV, sodium chloride flush  Assessment/ Plan:  [name] is a 50 y.o. Black male with end stage renal disease on hemodialysis, hypertension, hyperlipidemia, BPH, diabetes mellitus type II, GERd, gout, sleep apnea who is admitted to [facility] on 11/14/2020 for Ventricular tachycardia ([place]) [I47.2] Ventricular tachycardia (paroxysmal) ([place]) [I47.2]  [facility] MWF [facility] Left AVF 143kg  End Stage Renal Disease: Received dialysis yesterday. Tolerated well  Next treatment scheduled for Monday, if remains inpatient.   Hypotension: with history of hypertension. New onset V-tach. Appreciate cardiology input. May need to hold blood pressure agents if remains low: amlodipine, furosemide, carvedilol and tamsulosin. BP 116/83   Anemia with chronic kidney disease: Hemoglobin 12.7 - at goal. ESA at outpatient.   Secondary Hyperparathyroidism: Continue calcium acetate with meals and cinacalcet three times a week.   Low back pain positional. Ibuprofen for pain.    LOS: 0 Bethanny Toelle 8/7/20221:49 PM

## 2020-11-16 NOTE — Plan of Care (Signed)

## 2020-11-17 LAB — GLUCOSE, CAPILLARY: Glucose-Capillary: 130 mg/dL — ABNORMAL HIGH (ref 70–99)

## 2020-11-26 ENCOUNTER — Telehealth (INDEPENDENT_AMBULATORY_CARE_PROVIDER_SITE_OTHER): Payer: Self-pay | Admitting: Vascular Surgery

## 2020-11-26 NOTE — Telephone Encounter (Signed)
Called stating that he is experiencing numbness in feet/hands (bil). Patient states that symptoms have been going on for 2 weeks. Patient sates that he went to [facility] about 2 weeks ago and he was admitted but nothing was done about his hands/feet. (Hands/feet not cold or discolored)Patient would like to come in to be seen. Patient was last seen 10/23/20 with HDA (FB).  Please advise.

## 2020-11-26 NOTE — Telephone Encounter (Signed)
The patient truly needs to reach out to his PCP.  While he has a dialysis access, that generally would not make his hands and feet numb.  We can look to see if there are vascular issues causing this but if it is not a vascular issue, we wont be able to treat it.  Therefore he needs to see his PCP as well so that they can evaluate him too. He can come in with ABIS and an HDA to see GS

## 2020-11-26 NOTE — Telephone Encounter (Signed)
Patient was made aware with medical recommendations and will need to be schedule for appointment on a Tuesday or Thursday

## 2020-11-27 ENCOUNTER — Other Ambulatory Visit (INDEPENDENT_AMBULATORY_CARE_PROVIDER_SITE_OTHER): Payer: Self-pay | Admitting: Vascular Surgery

## 2020-11-27 ENCOUNTER — Other Ambulatory Visit: Payer: Self-pay

## 2020-11-27 ENCOUNTER — Encounter (INDEPENDENT_AMBULATORY_CARE_PROVIDER_SITE_OTHER): Payer: Medicare Other

## 2020-11-27 ENCOUNTER — Ambulatory Visit (INDEPENDENT_AMBULATORY_CARE_PROVIDER_SITE_OTHER): Payer: Medicare Other | Admitting: Vascular Surgery

## 2020-11-27 ENCOUNTER — Ambulatory Visit (INDEPENDENT_AMBULATORY_CARE_PROVIDER_SITE_OTHER): Payer: Medicare Other

## 2020-11-27 ENCOUNTER — Encounter (INDEPENDENT_AMBULATORY_CARE_PROVIDER_SITE_OTHER): Payer: Self-pay | Admitting: Vascular Surgery

## 2020-11-27 VITALS — BP 133/82 | HR 88 | Resp 16 | Wt 319.4 lb

## 2020-11-27 DIAGNOSIS — I2510 Atherosclerotic heart disease of native coronary artery without angina pectoris: Secondary | ICD-10-CM

## 2020-11-27 DIAGNOSIS — N186 End stage renal disease: Secondary | ICD-10-CM | POA: Diagnosis not present

## 2020-11-27 DIAGNOSIS — I10 Essential (primary) hypertension: Secondary | ICD-10-CM

## 2020-11-27 DIAGNOSIS — R200 Anesthesia of skin: Secondary | ICD-10-CM | POA: Diagnosis not present

## 2020-11-27 DIAGNOSIS — Z992 Dependence on renal dialysis: Secondary | ICD-10-CM | POA: Diagnosis not present

## 2020-11-27 DIAGNOSIS — I25118 Atherosclerotic heart disease of native coronary artery with other forms of angina pectoris: Secondary | ICD-10-CM

## 2020-11-27 DIAGNOSIS — I120 Hypertensive chronic kidney disease with stage 5 chronic kidney disease or end stage renal disease: Secondary | ICD-10-CM

## 2020-11-27 DIAGNOSIS — M5137 Other intervertebral disc degeneration, lumbosacral region: Secondary | ICD-10-CM

## 2020-11-27 DIAGNOSIS — E1122 Type 2 diabetes mellitus with diabetic chronic kidney disease: Secondary | ICD-10-CM

## 2020-11-27 NOTE — Telephone Encounter (Signed)
Called and scheduled patient

## 2020-11-28 NOTE — Telephone Encounter (Signed)
The pt called and left a message on the nurses line saying that during dialysis today his HR was 133 and he got very dizzy he would like to know is this normal please advise.

## 2020-11-29 NOTE — Telephone Encounter (Signed)
The patient needs to dscuss this with his nephrologist.  We control his fistula but not the actual dialysis process.  When there is something that actually goes wrong during dialysis it is the nephrologist that heart healthy dialysis and having that is ongoing with the dialysis process.  He needs to contact them

## 2020-12-01 ENCOUNTER — Encounter (INDEPENDENT_AMBULATORY_CARE_PROVIDER_SITE_OTHER): Payer: Self-pay | Admitting: Vascular Surgery

## 2020-12-01 NOTE — Progress Notes (Signed)
MRN : [id number]  Donald Fernandez is a 50 y.o. (Sep 08, 1970) male who presents with chief complaint of pain in my hands and both feet.  History of Present Illness:   The patient is seen sooner than expected for c/o increasing pain in both his hands as well as both feet.  Patient notes the pain is variable and not always associated with activity.  In fact his hands wake him up at night and seems to be associated with neck pain.   The pain is somewhat consistent day to day occurring on most days. The patient notes the pain also occurs with standing and routinely seems worse as the day wears on. The pain has been progressive over the past several years. The patient states these symptoms are causing  a profound negative impact on quality of life and daily activities.  As a secondary issue during a follow-up sleep study per the patient he had a 38-second pause in his heartbeat.  He was sent to the hospital and evaluated and eventually sent home and asked to follow-up with cardiology but he is not yet arranged a follow-up visit  The patient denies rest pain or dangling of an extremity off the side of the bed during the night for relief. No open wounds or sores at this time. No history of DVT or phlebitis. No prior interventions or surgeries.  There is a  history of back problems and DJD of the lumbar and cervical spine.    ABI's are essentially normal with triphasic Doppler signals at both ankles  Current Meds  Medication Sig   anastrozole (ARIMIDEX) 1 MG tablet TAKE ONE-HALF TABLET BY MOUTH ONCE DAILY   aspirin EC 81 MG tablet Take 81 mg by mouth daily.   atorvastatin (LIPITOR) 20 MG tablet Take 20 mg by mouth daily.    calcium acetate (PHOSLO) 667 MG capsule Take 2,668 mg by mouth 3 (three) times daily with meals.   cinacalcet (SENSIPAR) 90 MG tablet Take 1 tablet (90 mg total) by mouth every Monday, Wednesday, and Friday.   clopidogrel (PLAVIX) 75 MG tablet Take 1 tablet by mouth once  daily   docusate sodium (COLACE) 100 MG capsule Take 100 mg by mouth daily as needed for mild constipation.   fluticasone (FLONASE) 50 MCG/ACT nasal spray Place 1 spray into both nostrils daily as needed for allergies or rhinitis.   gabapentin (NEURONTIN) 100 MG capsule Take 1 capsule (100 mg total) by mouth 3 (three) times daily.   metoprolol succinate (TOPROL-XL) 50 MG 24 hr tablet Take 1 tablet (50 mg total) by mouth daily. Take with or immediately following a meal.   midodrine (PROAMATINE) 10 MG tablet Take 1 tablet (10 mg total) by mouth 3 (three) times daily with meals.   montelukast (SINGULAIR) 10 MG tablet Take 10 mg by mouth daily.   omeprazole (PRILOSEC) 40 MG capsule Take 40 mg by mouth daily.   Semaglutide 14 MG TABS Take 14 mg by mouth daily.   tamsulosin (FLOMAX) 0.4 MG CAPS capsule Take 0.8 mg by mouth daily after breakfast.    Testosterone 20.25 MG/ACT (1.62%) GEL Place 1 application onto the skin daily. (Apply to shoulders)   topiramate (TOPAMAX) 25 MG tablet Take 50 mg by mouth daily.   traZODone (DESYREL) 50 MG tablet Take 50 mg by mouth at bedtime.    Past Medical History:  Diagnosis Date   Arthritis    Asthma    triggered by enviornmental allergies   BPH (  benign prostatic hyperplasia)    Diabetes mellitus without complication ([place])    Dialysis patient ([place])    dialysis T-TH-SAT   GERD (gastroesophageal reflux disease)    Gout    Hyperlipidemia    Hypertension    Neuropathy    Renal disorder    CKD4; end stage renal disease   Restless legs    Sleep apnea    uses cpap    Past Surgical History:  Procedure Laterality Date   A/V FISTULAGRAM N/A 06/08/2017   Procedure: A/V FISTULAGRAM;  Surgeon: Algernon Huxley, MD;  Location: [place] [facility];  Service: Cardiovascular;  Laterality: N/A;   ANKLE SURGERY Right    fracture repair with pins, plates, screws   AV FISTULA INSERTION W/ RF MAGNETIC GUIDANCE Left 09/02/2017   Procedure: AV FISTULA INSERTION W/RF  MAGNETIC GUIDANCE;  Surgeon: Katha Cabal, MD;  Location: [place] [facility];  Service: Cardiovascular;  Laterality: Left;   AV FISTULA PLACEMENT Left 08/04/2016   Procedure: ARTERIOVENOUS (AV) FISTULA CREATION ( RADIOCEPHALIC );  Surgeon: Katha Cabal, MD;  Location: [facility];  Service: Vascular;  Laterality: Left;   DIALYSIS/PERMA CATHETER INSERTION N/A 06/06/2017   Procedure: DIALYSIS/PERMA CATHETER INSERTION;  Surgeon: Algernon Huxley, MD;  Location: [place] [facility];  Service: Cardiovascular;  Laterality: N/A;   DIALYSIS/PERMA CATHETER REMOVAL N/A 05/04/2018   Procedure: DIALYSIS/PERMA CATHETER REMOVAL;  Surgeon: Algernon Huxley, MD;  Location: [place] [facility];  Service: Cardiovascular;  Laterality: N/A;   IR ANGIO INTRA EXTRACRAN SEL INTERNAL CAROTID UNI R MOD SED  12/17/2016   RADIOLOGY WITH ANESTHESIA N/A 12/17/2016   Procedure: Arteriogram/Venogram with right venous sinus manometry;  Surgeon: Consuella Lose, MD;  Location: [place];  Service: Radiology;  Laterality: N/A;   REVISON OF ARTERIOVENOUS FISTULA Left 01/04/2018   Procedure: REVISON OF ARTERIOVENOUS FISTULA ( BRACHIALCEPHALIC );  Surgeon: Katha Cabal, MD;  Location: [facility];  Service: Vascular;  Laterality: Left;   UPPER EXTREMITY ANGIOGRAPHY Left 10/19/2017   Procedure: UPPER EXTREMITY ANGIOGRAPHY;  Surgeon: Katha Cabal, MD;  Location: [place] [facility];  Service: Cardiovascular;  Laterality: Left;    Social History Social History   Tobacco Use   Smoking status: Every Day    Types: Cigarettes   Smokeless tobacco: Never   Tobacco comments:    APPROX. 1 PK/WEEK   Vaping Use   Vaping Use: Never used  Substance Use Topics   Alcohol use: Yes    Comment: weekends   Drug use: Yes    Types: Marijuana    Comment: LAST TIME APPROX. 08/03/17    Family History Family History  Problem Relation Age of Onset   AAA (abdominal aortic aneurysm) Mother    Diabetes Mother    Heart disease Father      Allergies  Allergen Reactions   Accupril [Quinapril Hcl] Anaphylaxis    Angioedema   Ace Inhibitors Anaphylaxis and Other (See Comments)    Angioedema Other reaction(s): Unknown Angioedema Angioedema Other reaction(s): Unknown Angioedema Angioedema Angioedema Angioedema      REVIEW OF SYSTEMS (Negative unless checked)  Constitutional: '[]'$ Weight loss  '[]'$ Fever  '[]'$ Chills Cardiac: '[]'$ Chest pain   '[]'$ Chest pressure   '[]'$ Palpitations   '[]'$ Shortness of breath when laying flat   '[]'$ Shortness of breath with exertion. Vascular:  '[x]'$ Pain in legs with walking   '[x]'$ Pain in legs at rest  '[]'$ History of DVT   '[]'$ Phlebitis   '[]'$ Swelling in legs   '[]'$ Varicose veins   '[]'$ Non-healing  ulcers Pulmonary:   '[]'$ Uses home oxygen   '[]'$ Productive cough   '[]'$ Hemoptysis   '[]'$ Wheeze  '[]'$ COPD   '[]'$ Asthma Neurologic:  '[]'$ Dizziness   '[]'$ Seizures   '[x]'$ History of stroke   '[]'$ History of TIA  '[]'$ Aphasia   '[]'$ Vissual changes   '[]'$ Weakness or numbness in arm   '[]'$ Weakness or numbness in leg Musculoskeletal:   '[]'$ Joint swelling   '[x]'$ Joint pain   '[x]'$ Low back pain Hematologic:  '[]'$ Easy bruising  '[]'$ Easy bleeding   '[]'$ Hypercoagulable state   '[]'$ Anemic Gastrointestinal:  '[]'$ Diarrhea   '[]'$ Vomiting  '[]'$ Gastroesophageal reflux/heartburn   '[]'$ Difficulty swallowing. Genitourinary:  '[x]'$ Chronic kidney disease   '[]'$ Difficult urination  '[]'$ Frequent urination   '[]'$ Blood in urine Skin:  '[]'$ Rashes   '[]'$ Ulcers  Psychological:  '[]'$ History of anxiety   '[]'$  History of major depression.  Physical Examination  Vitals:   11/27/20 1153  BP: 133/82  Pulse: 88  Resp: 16  Weight: (!) 319 lb 6.4 oz (144.9 kg)   Body mass index is 44.55 kg/m. Gen: WD/WN, NAD Head: [place]/AT, No temporalis wasting.  Ear/Nose/Throat: Hearing grossly intact, nares w/o erythema or drainage Eyes: PER, EOMI, sclera nonicteric.  Neck: Supple, no masses.  No bruit or JVD.  Pulmonary:  Good air movement, no audible wheezing, no use of accessory muscles.  Cardiac: RRR, normal S1, S2, no  Murmurs. Vascular:   left arm access good thrill good bruit Vessel Right Left  Radial Palpable Palpable  Carotid Palpable Palpable  PT Palpable Palpable  DP Palpable Palpable  Gastrointestinal: soft, non-distended. No guarding/no peritoneal signs.  Musculoskeletal: M/S 5/5 throughout.  Arthritic deformity multiple joints.  Neurologic: CN 2-12 intact. Pain and light touch intact in extremities.  Symmetrical.  Speech is fluent. Motor exam as listed above. Psychiatric: Judgment intact, Mood & affect appropriate for pt's clinical situation. Dermatologic: No rashes or ulcers noted.  No changes consistent with cellulitis.   CBC Lab Results  Component Value Date   WBC 6.2 11/15/2020   HGB 12.7 (L) 11/15/2020   HCT 39.1 11/15/2020   MCV 97.3 11/15/2020   PLT 225 11/15/2020    BMET    Component Value Date/Time   NA 131 (L) 11/16/2020 0615   NA 145 12/31/2011 1850   K 5.4 (H) 11/16/2020 0615   K 3.5 12/31/2011 1850   CL 91 (L) 11/16/2020 0615   CL 108 (H) 12/31/2011 1850   CO2 25 11/16/2020 0615   CO2 28 12/31/2011 1850   GLUCOSE 113 (H) 11/16/2020 0615   GLUCOSE 63 (L) 12/31/2011 1850   BUN 56 (H) 11/16/2020 0615   BUN 14 12/31/2011 1850   CREATININE 16.21 (H) 11/16/2020 0615   CREATININE 1.72 (H) 12/31/2011 1850   CALCIUM 8.9 11/16/2020 0615   CALCIUM 8.1 (L) 06/06/2017 2038   GFRNONAA 3 (L) 11/16/2020 0615   GFRNONAA 48 (L) 12/31/2011 1850   GFRAA 5 (L) 12/08/2019 0633   GFRAA 55 (L) 12/31/2011 1850   Estimated Creatinine Clearance: 7.8 mL/min (A) (by C-G formula based on SCr of 16.21 mg/dL (H)).  COAG Lab Results  Component Value Date   INR 0.88 12/14/2017   INR 0.90 09/02/2017   INR 0.91 08/08/2017    Radiology CT ABDOMEN PELVIS WO CONTRAST  Result Date: 11/14/2020 CLINICAL DATA:  Left lower quadrant pain EXAM: CT ABDOMEN AND PELVIS WITHOUT CONTRAST TECHNIQUE: Multidetector CT imaging of the abdomen and pelvis was performed following the standard protocol  without IV contrast. COMPARISON:  CT 07/09/2016 FINDINGS: Lower chest: Lung bases demonstrate atelectasis at the right base.  No acute consolidation or effusion. Coronary vascular calcification. Hepatobiliary: No focal liver abnormality is seen. No gallstones, gallbladder wall thickening, or biliary dilatation. Pancreas: Unremarkable. No pancreatic ductal dilatation or surrounding inflammatory changes. Spleen: Normal in size without focal abnormality. Adrenals/Urinary Tract: Adrenal glands are normal. Kidneys are atrophic. No hydronephrosis. Probable cyst upper pole left kidney. 2.3 cm slightly hyperdense lesion mid left kidney. The bladder is unremarkable Stomach/Bowel: Stomach is within normal limits. Appendix appears normal. No evidence of bowel wall thickening, distention, or inflammatory changes. Vascular/Lymphatic: Moderate aortic atherosclerosis. No aneurysm. No suspicious nodes. Reproductive: Prostate is unremarkable. Other: Negative for free air or free fluid Musculoskeletal: No acute osseous abnormality. Progression of bilateral SI joint arthropathy with sclerosis and lucency. Irregular endplate changes at S1, series 6, image 93, increased compared to prior. Facet degenerative changes at multiple levels. IMPRESSION: 1. No CT evidence for acute intra-abdominopelvic abnormality. 2. Atrophic kidneys. 2.3 cm hyperdense lesion within the mid left kidney, indeterminate. Consider correlation with ultrasound 3. Progression of bilateral SI joint arthropathy. Irregular endplate changes superiorly at S1. No corresponding paravertebral soft tissue abnormality. Correlate for any signs or symptoms of infection or back pain with follow-up MRI if indicated. Electronically Signed   By: Donavan Foil M.D.   On: 11/14/2020 15:30   VAS [place] ABI WITH/WO TBI  Result Date: 11/27/2020  LOWER EXTREMITY DOPPLER STUDY Patient Name:  Donald Fernandez  Date of Exam:   11/27/2020 Medical Rec #: [id number]         Accession #:     [id number] Date of Birth: [birth year]          Patient Gender: M Patient Age:   89 years Exam Location:  [facility] Procedure:      VAS [place] ABI WITH/WO TBI Referring Phys: Hortencia Pilar --------------------------------------------------------------------------------   Performing Technologist: Almira Coaster RVS  Examination Guidelines: A complete evaluation includes at minimum, Doppler waveform signals and systolic blood pressure reading at the level of bilateral brachial, anterior tibial, and posterior tibial arteries, when vessel segments are accessible. Bilateral testing is considered an integral part of a complete examination. Photoelectric Plethysmograph (PPG) waveforms and toe systolic pressure readings are included as required and additional duplex testing as needed. Limited examinations for reoccurring indications may be performed as noted.  ABI Findings: +---------+------------------+-----+---------+--------+ Right    Rt Pressure (mmHg)IndexWaveform Comment  +---------+------------------+-----+---------+--------+ Brachial 67                                       +---------+------------------+-----+---------+--------+ ATA      110                    triphasic         +---------+------------------+-----+---------+--------+ PTA      105               1.57 triphasic         +---------+------------------+-----+---------+--------+ Great Toe63                0.94 Normal            +---------+------------------+-----+---------+--------+ +---------+------------------+-----+---------+-------+ Left     Lt Pressure (mmHg)IndexWaveform Comment +---------+------------------+-----+---------+-------+ ATA      250                    triphasicNC      +---------+------------------+-----+---------+-------+ PTA      149  2.22 triphasic        +---------+------------------+-----+---------+-------+ Great Toe62                0.93 Normal            +---------+------------------+-----+---------+-------+ +-------+-----------+-----------+------------+------------+ ABI/TBIToday's ABIToday's TBIPrevious ABIPrevious TBI +-------+-----------+-----------+------------+------------+ Right  1.64       .94                                 +-------+-----------+-----------+------------+------------+ Left   >1.0 [place]    .93                                 +-------+-----------+-----------+------------+------------+  Summary: Right: Resting right ankle-brachial index is within normal range. No evidence of significant right lower extremity arterial disease. The right toe-brachial index is normal. Upper Digits obtained no significant decreases seen. Left: Resting left ankle-brachial index indicates noncompressible left lower extremity arteries. The left toe-brachial index is normal. Upper Digits obtained no significant decreases seen.  *See table(s) above for measurements and observations.  Electronically signed by Hortencia Pilar MD on 11/27/2020 at 5:14:56 PM.    Final    ECHOCARDIOGRAM COMPLETE  Result Date: 11/14/2020    ECHOCARDIOGRAM REPORT   Patient Name:   Donald Fernandez Date of Exam: 11/14/2020 Medical Rec #:  [id number]        Height:       71.0 in Accession #:    [id number]       Weight:       315.3 lb Date of Birth:  1970-12-20         BSA:          2.558 m Patient Age:    54 years         BP:           137/105 mmHg Patient Gender: M                HR:           89 bpm. Exam Location:  [facility] Procedure: 2D Echo, Cardiac Doppler and Color Doppler Indications:     CHF-acute diastolic [id number]  History:         Patient has no prior history of Echocardiogram examinations.                  Risk Factors:Diabetes and Sleep Apnea.  Sonographer:     Sherrie Sport RDCS (AE) Referring Phys:  [place] Diagnosing Phys: Serafina Royals MD  Sonographer Comments: Technically difficult study due to poor echo windows, no apical window and no subcostal window.  IMPRESSIONS  1. Left ventricular ejection fraction, by estimation, is 65 to 70%. The left ventricle has normal function. The left ventricle has no regional wall motion abnormalities. Left ventricular diastolic parameters were normal.  2. Right ventricular systolic function is normal. The right ventricular size is normal.  3. The mitral valve is normal in structure. Mild mitral valve regurgitation.  4. The aortic valve is normal in structure. Aortic valve regurgitation is trivial. FINDINGS  Left Ventricle: Left ventricular ejection fraction, by estimation, is 65 to 70%. The left ventricle has normal function. The left ventricle has no regional wall motion abnormalities. The left ventricular internal cavity size was small. There is no left ventricular hypertrophy. Left ventricular diastolic parameters were normal. Right Ventricle: The right ventricular size is normal.  No increase in right ventricular wall thickness. Right ventricular systolic function is normal. Left Atrium: Left atrial size was normal in size. Right Atrium: Right atrial size was normal in size. Pericardium: There is no evidence of pericardial effusion. Mitral Valve: The mitral valve is normal in structure. Mild mitral valve regurgitation. Tricuspid Valve: The tricuspid valve is normal in structure. Tricuspid valve regurgitation is mild. Aortic Valve: The aortic valve is normal in structure. Aortic valve regurgitation is trivial. Pulmonic Valve: The pulmonic valve was normal in structure. Pulmonic valve regurgitation is trivial. Aorta: The aortic root and ascending aorta are structurally normal, with no evidence of dilitation. IAS/Shunts: No atrial level shunt detected by color flow Doppler.  LEFT VENTRICLE PLAX 2D LVIDd:         4.24 cm LVIDs:         2.78 cm LV PW:         1.72 cm LV IVS:        1.50 cm  LEFT ATRIUM         Index LA diam:    3.40 cm 1.33 cm/m                        PULMONIC VALVE AORTA                 PV Vmax:        0.69 m/s Ao  Root diam: 3.80 cm PV Peak grad:   1.9 mmHg                       RVOT Peak grad: 2 mmHg  Serafina Royals MD Electronically signed by Serafina Royals MD Signature Date/Time: 11/14/2020/5:23:05 PM    Final    SLEEP STUDY DOCUMENTS  Result Date: 11/27/2020 Ordered by an unspecified provider.  SLEEP STUDY DOCUMENTS  Result Date: 11/17/2020 Ordered by an unspecified provider.    Assessment/Plan 1. DDD (degenerative disc disease), lumbosacral Recommend:  I do not find evidence of Vascular pathology that would explain the patient's symptoms  The patient has atypical pain symptoms for vascular disease  I do not find evidence of Vascular pathology that would explain the patient's symptoms and I suspect the patient is c/o pseudoclaudication.  Patient should have an evaluation of his LS spine which I defer to the primary service.  Noninvasive studies including venous ultrasound of the legs do not identify vascular problems  The patient should continue walking and begin a more formal exercise program. The patient should continue his antiplatelet therapy and aggressive treatment of the lipid abnormalities. The patient should begin wearing graduated compression socks 15-20 mmHg strength to control her mild edema.  Further work-up of her lower extremity pain is deferred to Spine Service and I have placed a consult    - Ambulatory referral to Neurosurgery  2. ESRD on dialysis [facility]) Recommend:  The patient is doing well and currently has adequate dialysis access. The patient's dialysis center is not reporting any access issues. Flow pattern is stable when compared to the prior ultrasound.  The patient should have a duplex ultrasound of the dialysis access in 6 months. The patient will follow-up with me in the office after each ultrasound     3. Essential hypertension Continue antihypertensive medications as already ordered, these medications have been reviewed and there are no changes at this  time.   4. Type 2 DM with hypertension and ESRD on dialysis [facility]) Continue hypoglycemic medications as already  ordered, these medications have been reviewed and there are no changes at this time.  Hgb A1C to be monitored as already arranged by primary service   5. Coronary artery disease of native artery of native heart with stable angina pectoris [facility]) Given his cardiac pause I will place a consult to get him back on track for follow up - Ambulatory referral to Cardiology    Hortencia Pilar, MD  12/01/2020 8:21 AM

## 2020-12-02 NOTE — Telephone Encounter (Signed)
I called the pt and made him aware of the NP's instructions.

## 2020-12-07 ENCOUNTER — Other Ambulatory Visit: Payer: Self-pay | Admitting: Urology

## 2020-12-17 ENCOUNTER — Other Ambulatory Visit (INDEPENDENT_AMBULATORY_CARE_PROVIDER_SITE_OTHER): Payer: Self-pay | Admitting: Vascular Surgery

## 2020-12-17 DIAGNOSIS — N186 End stage renal disease: Secondary | ICD-10-CM

## 2020-12-17 DIAGNOSIS — Z992 Dependence on renal dialysis: Secondary | ICD-10-CM

## 2020-12-18 ENCOUNTER — Ambulatory Visit (INDEPENDENT_AMBULATORY_CARE_PROVIDER_SITE_OTHER): Payer: Medicare Other | Admitting: Vascular Surgery

## 2020-12-18 ENCOUNTER — Encounter (INDEPENDENT_AMBULATORY_CARE_PROVIDER_SITE_OTHER): Payer: Medicare Other

## 2020-12-23 ENCOUNTER — Other Ambulatory Visit: Payer: Self-pay

## 2020-12-23 ENCOUNTER — Other Ambulatory Visit
Admission: RE | Admit: 2020-12-23 | Discharge: 2020-12-23 | Disposition: A | Discharge status: Home / Self Care | Payer: Medicare Other | Source: Ambulatory Visit | Attending: Family Medicine | Admitting: Family Medicine

## 2020-12-23 DIAGNOSIS — Z20822 Contact with and (suspected) exposure to covid-19: Secondary | ICD-10-CM | POA: Diagnosis not present

## 2020-12-23 DIAGNOSIS — Z01812 Encounter for preprocedural laboratory examination: Secondary | ICD-10-CM | POA: Insufficient documentation

## 2020-12-23 LAB — SARS CORONAVIRUS 2 (TAT 6-24 HRS): SARS Coronavirus 2: NEGATIVE

## 2020-12-24 ENCOUNTER — Other Ambulatory Visit: Payer: Medicare Other

## 2020-12-25 ENCOUNTER — Ambulatory Visit: Payer: Medicare Other | Attending: Neurology

## 2020-12-25 DIAGNOSIS — N186 End stage renal disease: Secondary | ICD-10-CM | POA: Diagnosis not present

## 2020-12-25 DIAGNOSIS — G4733 Obstructive sleep apnea (adult) (pediatric): Secondary | ICD-10-CM | POA: Insufficient documentation

## 2020-12-26 ENCOUNTER — Other Ambulatory Visit: Payer: Self-pay

## 2021-01-01 ENCOUNTER — Other Ambulatory Visit: Payer: Self-pay | Admitting: Neurosurgery

## 2021-01-01 DIAGNOSIS — M542 Cervicalgia: Secondary | ICD-10-CM

## 2021-01-01 DIAGNOSIS — R202 Paresthesia of skin: Secondary | ICD-10-CM

## 2021-01-13 ENCOUNTER — Other Ambulatory Visit: Payer: Self-pay

## 2021-01-13 ENCOUNTER — Ambulatory Visit
Admission: RE | Admit: 2021-01-13 | Discharge: 2021-01-13 | Disposition: A | Discharge status: Home / Self Care | Payer: Medicare Other | Source: Ambulatory Visit | Attending: Neurosurgery | Admitting: Neurosurgery

## 2021-01-13 DIAGNOSIS — M542 Cervicalgia: Secondary | ICD-10-CM | POA: Insufficient documentation

## 2021-01-13 DIAGNOSIS — R202 Paresthesia of skin: Secondary | ICD-10-CM | POA: Diagnosis present

## 2021-01-13 DIAGNOSIS — R200 Anesthesia of skin: Secondary | ICD-10-CM | POA: Diagnosis present

## 2021-03-14 IMAGING — MR MR LUMBAR SPINE W/O CM
5 series · 31 of 48 positions shown · non-contrast
Comparison: Lumbar radiographs 02/05/2019.

CLINICAL DATA: 48-year-old male with 2 years of low back pain
radiating to the bilateral hips and legs. No known injury.

EXAM:
MRI LUMBAR SPINE WITHOUT CONTRAST
TECHNIQUE: Multiplanar, multisequence MR imaging of the lumbar spine was
performed. No intravenous contrast was administered.

[Series 5: T2 · sagittal · 4.0mm · 0.81mm/px · 7 of 17 slices shown (1 of 2)]
[im 1/17]
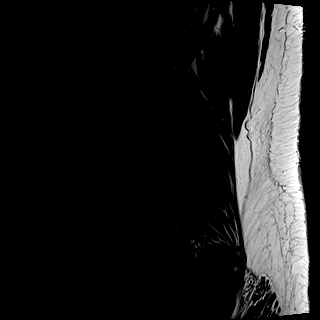
[im 3/17]
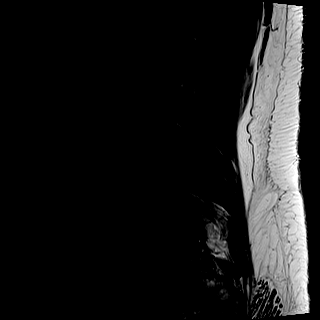
[im 6/17]
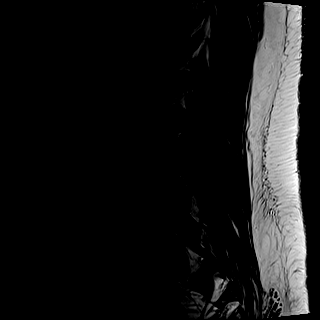
[im 9/17]
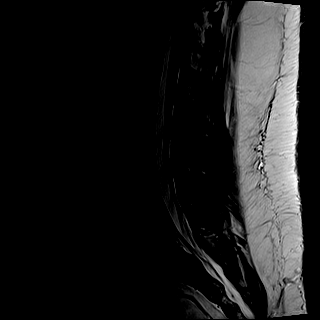
[im 11/17]
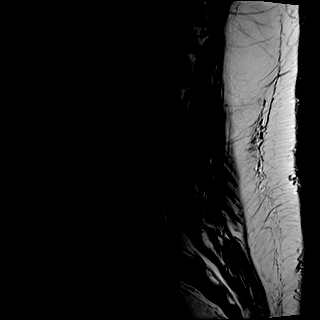
[im 14/17]
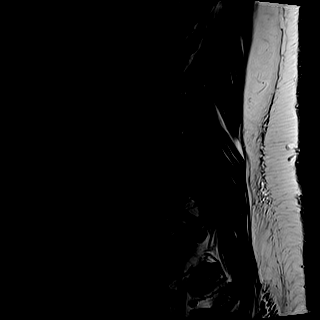
[im 17/17]
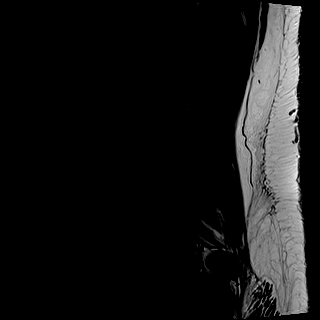

[Series 6: T1 · sagittal · 4.0mm · 0.81mm/px · 7 of 17 slices shown (1 of 2)]
[im 1/17]
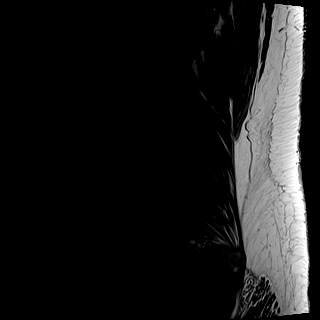
[im 3/17]
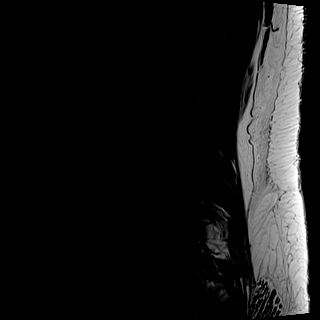
[im 6/17]
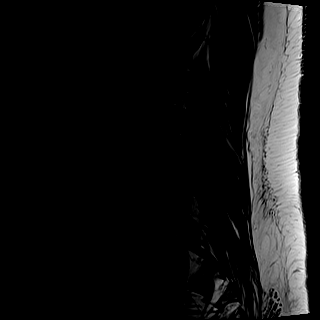
[im 9/17]
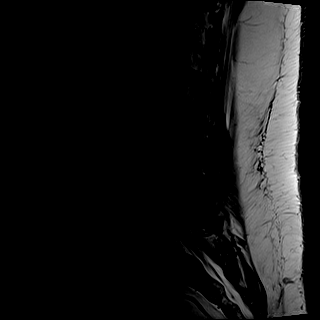
[im 11/17]
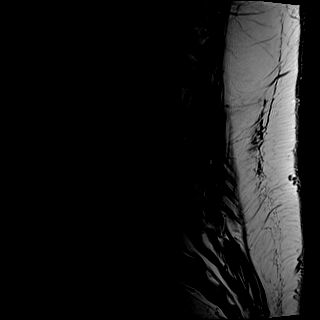
[im 14/17]
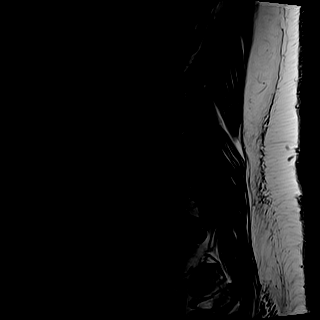
[im 17/17]
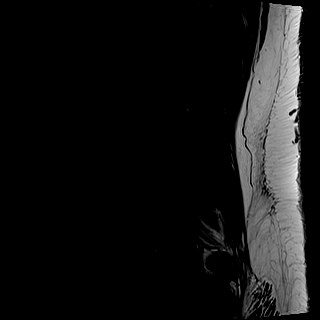

[Series 7: STIR · sagittal · 4.0mm · 0.41mm/px · 1 of 17 slices shown]
[im 1/17]
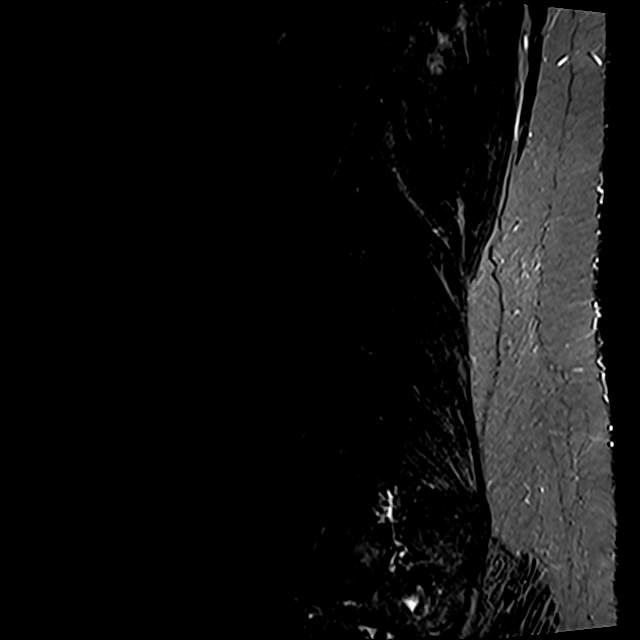

[Series 8: T2 · axial · 4.0mm · 0.78mm/px · z∈[-117,+105]mm · 8 of 38 slices shown (2 of 2)]
[im 1/38]
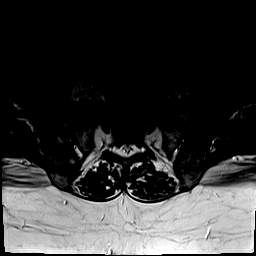
[im 6/38]
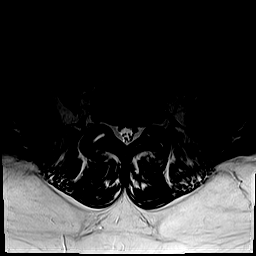
[im 12/38]
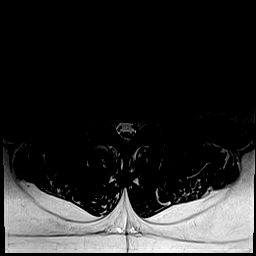
[im 18/38]
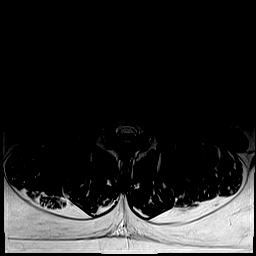
[im 20/38]
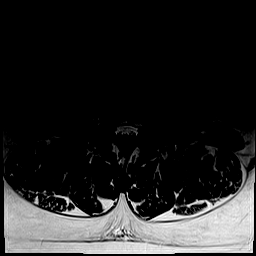
[im 26/38]
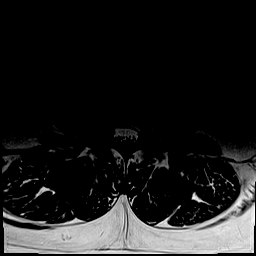
[im 32/38]
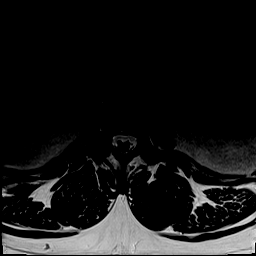
[im 38/38]
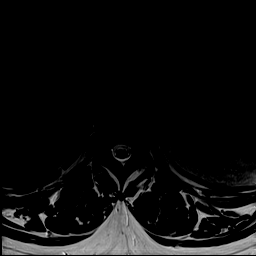

[Series 9: T1 · axial · 4.0mm · 0.39mm/px · z∈[-117,+105]mm · 8 of 38 slices shown (2 of 2)]
[im 1/38]
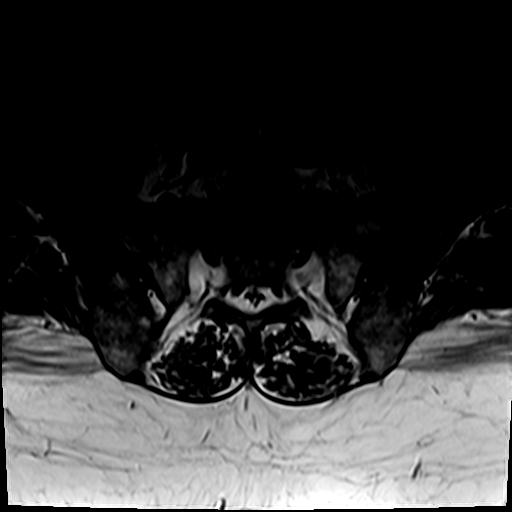
[im 6/38]
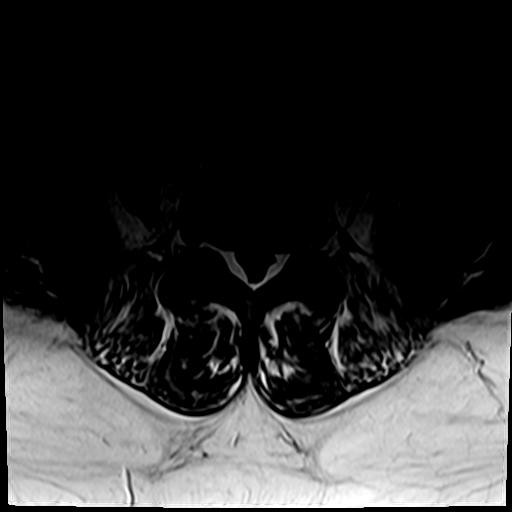
[im 12/38]
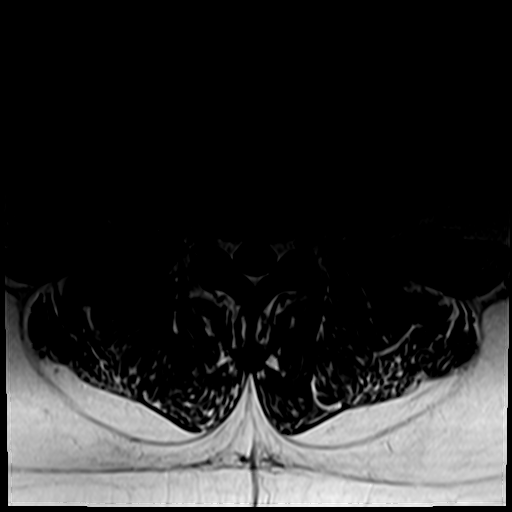
[im 18/38]
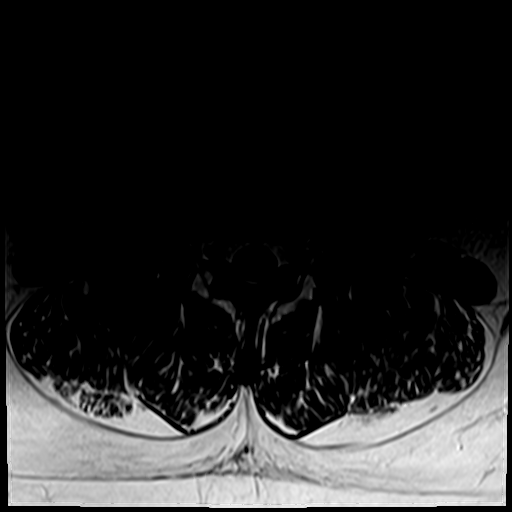
[im 20/38]
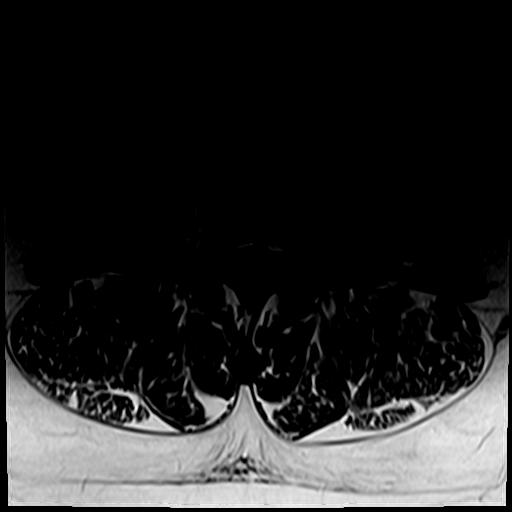
[im 26/38]
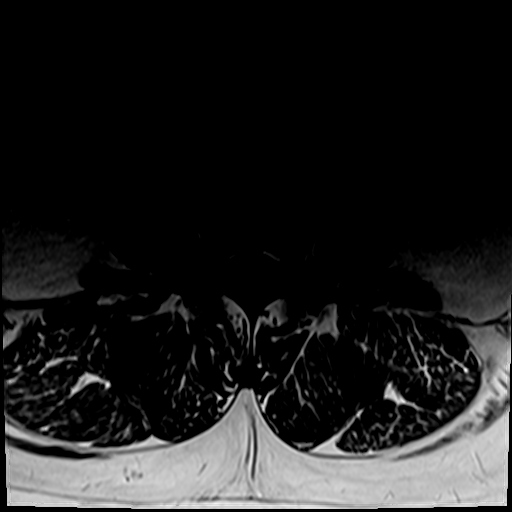
[im 32/38]
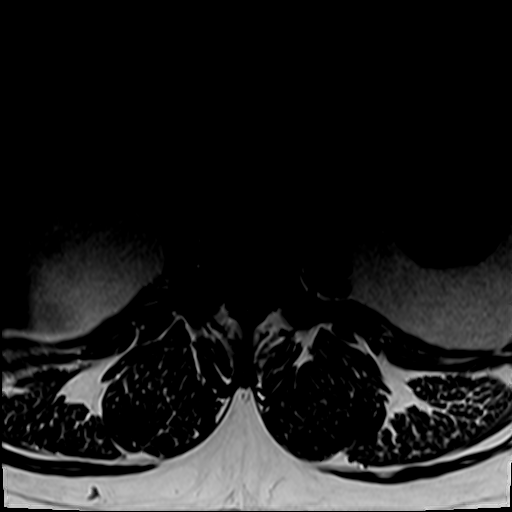
[im 38/38]
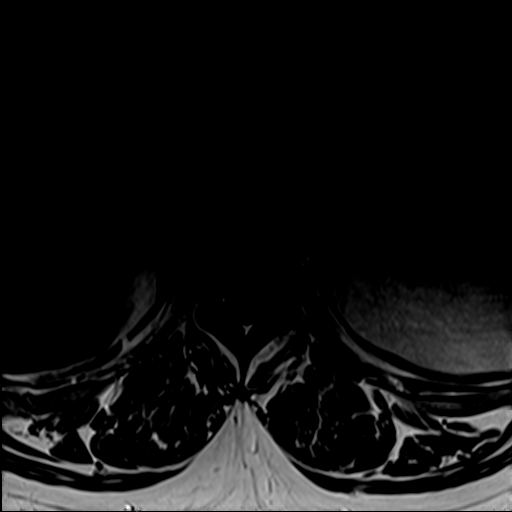

[31 of 48 positions shown; findings below may reference images not displayed]

FINDINGS: Segmentation:  Normal on the comparison radiographs.

Alignment: Preserved lumbar lordosis. Subtle retrolisthesis of L5 on
S1.

Vertebrae: Degenerative superior endplate Schmorl's nodes associated
with mild marrow edema at the L3, L4 and L5 endplates (series 7
image 9). Similar mild inferior endplate Schmorl's node edema at
T12.

Normal background bone marrow signal. Intact visible sacrum and SI
joints. No other acute osseous abnormality identified.

Conus medullaris and cauda equina: Conus extends to the L1-L2 level.
No lower spinal cord or conus signal abnormality.

Paraspinal and other soft tissues: A degree of bilateral renal
atrophy is suspected (series 8, image 11). Ectatic abdominal aorta
up to 25 millimeters diameter. Other visualized abdominal viscera
and paraspinal soft tissues are within normal limits.

Disc levels:

T12-L1:  Mild disc space loss with minimal disc bulge. No stenosis.

L1-L2:  Negative.

L2-L3: Mild mostly far lateral disc bulging and endplate spurring.
Borderline to mild posterior element hypertrophy. No stenosis.

L3-L4: Mild far lateral disc bulge and endplate spurring. Mild
posterior element hypertrophy. Borderline to mild bilateral L3
foraminal stenosis.

L4-L5: Mild far lateral disc bulging and endplate spurring. Mild
posterior element hypertrophy. Borderline to mild bilateral L4
foraminal stenosis.

L5-S1: Mild circumferential disc bulge and endplate spurring.
Moderate facet and mild ligament flavum hypertrophy. Degenerative
facet joint fluid greater on the right. Mild epidural lipomatosis.
No spinal or lateral recess stenosis. Mild to moderate left and mild
right L5 foraminal stenosis.
IMPRESSION: 1. Degenerative vertebral edema associated with superior endplate
Schmorl's nodes at T12, L3, L4 and L5. No other acute osseous
abnormality.
2. Mild for age lumbar spine degeneration with no spinal or lateral
recess stenosis. Up to mild multilevel neural foraminal stenosis
related to disc bulging and posterior element hypertrophy. Moderate
left L5 neural foraminal stenosis in part due to moderate facet
arthropathy.
3. Ectatic abdominal aorta at risk for aneurysm development.
Recommend followup by ultrasound in 5 years. This recommendation
follows ACR consensus guidelines: White Paper of the ACR Incidental
Aortic aneurysm NOS (AGNNL-9YP.6)

## 2021-05-21 ENCOUNTER — Ambulatory Visit (INDEPENDENT_AMBULATORY_CARE_PROVIDER_SITE_OTHER): Payer: Medicare Other | Admitting: Vascular Surgery

## 2021-05-21 ENCOUNTER — Encounter (INDEPENDENT_AMBULATORY_CARE_PROVIDER_SITE_OTHER): Payer: Medicare Other

## 2021-07-09 ENCOUNTER — Ambulatory Visit (INDEPENDENT_AMBULATORY_CARE_PROVIDER_SITE_OTHER): Payer: Medicare Other | Admitting: Vascular Surgery

## 2021-07-09 ENCOUNTER — Encounter (INDEPENDENT_AMBULATORY_CARE_PROVIDER_SITE_OTHER): Payer: Medicare Other

## 2021-07-13 ENCOUNTER — Encounter (INDEPENDENT_AMBULATORY_CARE_PROVIDER_SITE_OTHER): Payer: Medicare Other

## 2021-07-14 ENCOUNTER — Encounter (INDEPENDENT_AMBULATORY_CARE_PROVIDER_SITE_OTHER): Payer: Medicare Other

## 2021-07-14 ENCOUNTER — Ambulatory Visit (INDEPENDENT_AMBULATORY_CARE_PROVIDER_SITE_OTHER): Payer: Medicare Other | Admitting: Nurse Practitioner

## 2021-07-29 ENCOUNTER — Other Ambulatory Visit: Payer: Self-pay | Admitting: Gastroenterology

## 2021-07-29 DIAGNOSIS — R1011 Right upper quadrant pain: Secondary | ICD-10-CM

## 2021-08-04 ENCOUNTER — Ambulatory Visit: Payer: Medicare Other

## 2021-08-11 ENCOUNTER — Ambulatory Visit
Admission: RE | Admit: 2021-08-11 | Discharge: 2021-08-11 | Disposition: A | Discharge status: Home / Self Care | Payer: Medicare Other | Source: Ambulatory Visit | Attending: Gastroenterology | Admitting: Gastroenterology

## 2021-08-11 ENCOUNTER — Other Ambulatory Visit: Payer: Self-pay | Admitting: Urology

## 2021-08-11 DIAGNOSIS — R1011 Right upper quadrant pain: Secondary | ICD-10-CM

## 2021-08-20 ENCOUNTER — Ambulatory Visit
Admission: RE | Admit: 2021-08-20 | Discharge: 2021-08-20 | Disposition: A | Discharge status: Home / Self Care | Payer: Medicare Other | Source: Ambulatory Visit | Attending: Gastroenterology | Admitting: Gastroenterology

## 2021-08-20 DIAGNOSIS — R1011 Right upper quadrant pain: Secondary | ICD-10-CM | POA: Diagnosis not present

## 2021-08-25 ENCOUNTER — Other Ambulatory Visit: Payer: Self-pay

## 2021-08-25 ENCOUNTER — Ambulatory Visit: Payer: Medicare Other | Admitting: Gastroenterology

## 2021-08-29 ENCOUNTER — Other Ambulatory Visit: Payer: Self-pay | Admitting: Urology

## 2021-09-17 ENCOUNTER — Ambulatory Visit (INDEPENDENT_AMBULATORY_CARE_PROVIDER_SITE_OTHER): Payer: Medicare Other | Admitting: Nurse Practitioner

## 2021-09-17 ENCOUNTER — Encounter (INDEPENDENT_AMBULATORY_CARE_PROVIDER_SITE_OTHER): Payer: Medicare Other

## 2021-09-20 ENCOUNTER — Other Ambulatory Visit: Payer: Self-pay

## 2021-09-20 ENCOUNTER — Emergency Department
Admission: EM | Admit: 2021-09-20 | Discharge: 2021-09-21 | Disposition: A | Discharge status: Home / Self Care | Payer: Medicare Other | Attending: Emergency Medicine | Admitting: Emergency Medicine

## 2021-09-20 DIAGNOSIS — N186 End stage renal disease: Secondary | ICD-10-CM | POA: Diagnosis not present

## 2021-09-20 DIAGNOSIS — E1122 Type 2 diabetes mellitus with diabetic chronic kidney disease: Secondary | ICD-10-CM | POA: Insufficient documentation

## 2021-09-20 DIAGNOSIS — L299 Pruritus, unspecified: Secondary | ICD-10-CM | POA: Diagnosis present

## 2021-09-20 DIAGNOSIS — I2510 Atherosclerotic heart disease of native coronary artery without angina pectoris: Secondary | ICD-10-CM | POA: Diagnosis not present

## 2021-09-20 DIAGNOSIS — I120 Hypertensive chronic kidney disease with stage 5 chronic kidney disease or end stage renal disease: Secondary | ICD-10-CM | POA: Diagnosis not present

## 2021-09-20 MED ORDER — DIPHENHYDRAMINE HCL 12.5 MG/5ML PO ELIX
50.0000 mg | ORAL_SOLUTION | Freq: Once | ORAL | Status: AC
  Administered 2021-09-21: 50 mg via ORAL
  Filled 2021-09-20: qty 20

## 2021-09-20 NOTE — ED Provider Triage Note (Signed)
Emergency Medicine Provider Triage Evaluation Note  HEZAKIAH CHAMPEAU , a 51 y.o. male  was evaluated in triage.  Pt complains of itching to the lower extremity.  Review of Systems  Positive: Itching Negative: Hives  Physical Exam  Pulse (!) 121   Temp 98.3 F (36.8 C) (Oral)   Resp 20   Ht 1.753 m (5\' 9" )   Wt 136.1 kg   SpO2 95%   BMI 44.30 kg/m  Gen:   Awake, no distress   Resp:  Normal effort  MSK:   Moves extremities without difficulty  Other:  No evidence of bug bite  Medical Decision Making  Medically screening exam initiated at 9:59 PM.  Appropriate orders placed.  [name] was informed that the remainder of the evaluation will be completed by another provider, this initial triage assessment does not replace that evaluation, and the importance of remaining in the ED until their evaluation is complete.  Patient seem to think that he was bitten by multiple bugs but no evidence of bug bites, suspicious for possible allergic reaction, no oral complaints, no shortness of breath   Lavonia Drafts, MD 09/20/21 2238

## 2021-09-20 NOTE — ED Triage Notes (Signed)
C/o possible allergic reaction this evening. States bug came out of [facility] after car was worked on today. Dr. Corky Downs in triage at this time for pt eval. Pt c/o itching over entire body

## 2021-09-20 NOTE — ED Provider Notes (Signed)
[facility] Provider Note    Event Date/Time   First MD Initiated Contact with Patient 09/20/21 2321     (approximate)   History   Allergic Reaction   HPI  Donald Fernandez is a 51 y.o. male who presents to the ED for evaluation of Allergic Reaction   I reviewed cardiology clinic visit from 4/27.  Dialysis patient on MWF ESRD, stroke, CAD, DM, HTN and obesity.  Patient reports he missed dialysis this past Friday due to his presenting complaints.  Since Friday, patient reports diffuse pruritus and concerns for recurrent bug bites.  He reports seeing bugs, and the events of his car this past Friday and have been "all over" since that time.  Denies any recreational drugs such as methamphetamines, other stimulants.  No one else affected.  Has not tried any medications at home.  Physical Exam   Triage Vital Signs: ED Triage Vitals  Enc Vitals Group     BP 09/20/21 2201 (!) 155/79     Pulse Rate 09/20/21 2155 (!) 121     Resp 09/20/21 2155 20     Temp 09/20/21 2155 98.3 F (36.8 C)     Temp Source 09/20/21 2155 Oral     SpO2 09/20/21 2155 95 %     Weight 09/20/21 2155 300 lb (136.1 kg)     Height 09/20/21 2155 5\' 9"  (1.753 m)     Head Circumference --      Peak Flow --      Pain Score 09/20/21 2200 0     Pain Loc --      Pain Edu? --      Excl. in [place]? --     Most recent vital signs: Vitals:   09/20/21 2201 09/21/21 0213  BP: (!) 155/79 (!) 189/109  Pulse:  95  Resp:  17  Temp:    SpO2:  100%    General: Awake, no distress.  Frequently scratching all over his body.  No hives or rash noted.  No bugs or bug bites noted. He has linear thought processes. CV:  Good peripheral perfusion.  Resp:  Normal effort.  Abd:  No distention.  MSK:  No deformity noted.  Neuro:  No focal deficits appreciated. Other:     ED Results / Procedures / Treatments   Labs (all labs ordered are listed, but only abnormal results are displayed) Labs  Reviewed  BASIC METABOLIC PANEL - Abnormal; Notable for the following components:      Result Value   BUN 73 (*)    Creatinine, Ser 21.05 (*)    Calcium 8.7 (*)    GFR, Estimated 2 (*)    Anion gap 16 (*)    All other components within normal limits  CBC WITH DIFFERENTIAL/PLATELET - Abnormal; Notable for the following components:   RBC 3.83 (*)    Hemoglobin 11.8 (*)    HCT 36.8 (*)    RDW 16.2 (*)    All other components within normal limits    EKG   RADIOLOGY   Official radiology report(s): No results found.  PROCEDURES and INTERVENTIONS:  Procedures  Medications  diphenhydrAMINE (BENADRYL) 12.5 MG/5ML elixir 50 mg (50 mg Oral Given 09/21/21 0035)     IMPRESSION / MDM / ASSESSMENT AND PLAN / ED COURSE  I reviewed the triage vital signs and the nursing notes.  Differential diagnosis includes, but is not limited to, bug bites, hives, methamphetamine or other drug use, acute psychosis, hyperuricemia  Dialysis patient presents with pruritus and concerns for recurrent bug bites, resolving with Benadryl and suitable for outpatient management.  He is very concerned about bugs, but I did not see anything or any signs of bug bites on his skin.  Due to this I told him about my concerns for hyperuricemia and other serum pathologies that can contribute to his symptoms.  He has linear thought processes and has no other signs of acute psychoses.  Notifications for IVC or emergent psychiatric evaluation.  Symptoms improved and essentially resolved with oral Benadryl.  His blood work shows an elevated BUN in the 70s, but I do not believe he emergently needs dialysis.  He has an appointment in a couple hours for his typical outpatient dialysis and I think it is suitable for him to go to this appointment.  CBC with chronic anemia and otherwise unremarkable.  He initially refused blood work, but ultimately relented and was agreeable to this.  He slept for the majority of his time here and  ambulated out of the ED without distress or complaints.  Agreeing to go to dialysis in a couple hours.  Clinical Course as of 09/21/21 0314  Mon Sep 21, 2021  0018 Reassessed after he has brought back to a hallway bed, and I initially saw him in the waiting room.  He reports feeling better and is requesting discharge.  I recommended metabolic panel to check for the need for emergent dialysis, but he declines this saying he has to get out of here.  Reports he has dialysis first thing in the morning at 5 AM and plans to go to this.  He acknowledges the risk of undiagnosed pathology.  We discussed return precautions. [DS]  0140 Reassessed.  Comfortably asleep. [DS]  0158 reassessed [DS]    Clinical Course User Index [DS] Vladimir Crofts, MD     FINAL CLINICAL IMPRESSION(S) / ED DIAGNOSES   Final diagnoses:  Pruritus     Rx / DC Orders   ED Discharge Orders     None        Note:  This document was prepared using Dragon voice recognition software and may include unintentional dictation errors.   Vladimir Crofts, MD 09/21/21 [phone]

## 2021-09-21 LAB — BASIC METABOLIC PANEL
Anion gap: 16 — ABNORMAL HIGH (ref 5–15)
BUN: 73 mg/dL — ABNORMAL HIGH (ref 6–20)
CO2: 22 mmol/L (ref 22–32)
Calcium: 8.7 mg/dL — ABNORMAL LOW (ref 8.9–10.3)
Chloride: 102 mmol/L (ref 98–111)
Creatinine, Ser: 21.05 mg/dL — ABNORMAL HIGH (ref 0.61–1.24)
GFR, Estimated: 2 mL/min — ABNORMAL LOW (ref >60)
Glucose, Bld: 98 mg/dL (ref 70–99)
Potassium: 5 mmol/L (ref 3.5–5.1)
Sodium: 140 mmol/L (ref 135–145)

## 2021-09-21 LAB — CBC WITH DIFFERENTIAL/PLATELET
Abs Immature Granulocytes: 0.03 10*3/uL (ref 0.00–0.07)
Basophils Absolute: 0 10*3/uL (ref 0.0–0.1)
Basophils Relative: 1 %
Eosinophils Absolute: 0.2 10*3/uL (ref 0.0–0.5)
Eosinophils Relative: 3 %
HCT: 36.8 % — ABNORMAL LOW (ref 39.0–52.0)
Hemoglobin: 11.8 g/dL — ABNORMAL LOW (ref 13.0–17.0)
Immature Granulocytes: 0 %
Lymphocytes Relative: 14 %
Lymphs Abs: 1.3 10*3/uL (ref 0.7–4.0)
MCH: 30.8 pg (ref 26.0–34.0)
MCHC: 32.1 g/dL (ref 30.0–36.0)
MCV: 96.1 fL (ref 80.0–100.0)
Monocytes Absolute: 0.8 10*3/uL (ref 0.1–1.0)
Monocytes Relative: 9 %
Neutro Abs: 6.5 10*3/uL (ref 1.7–7.7)
Neutrophils Relative %: 73 %
Platelets: 265 10*3/uL (ref 150–400)
RBC: 3.83 MIL/uL — ABNORMAL LOW (ref 4.22–5.81)
RDW: 16.2 % — ABNORMAL HIGH (ref 11.5–15.5)
WBC: 8.9 10*3/uL (ref 4.0–10.5)
nRBC: 0 % (ref 0.0–0.2)

## 2021-09-21 NOTE — Discharge Instructions (Addendum)
Take Benadryl at home for your itching.  Make sure you go to your dialysis

## 2022-02-08 ENCOUNTER — Encounter (INDEPENDENT_AMBULATORY_CARE_PROVIDER_SITE_OTHER): Payer: Self-pay

## 2022-08-19 ENCOUNTER — Ambulatory Visit (INDEPENDENT_AMBULATORY_CARE_PROVIDER_SITE_OTHER): Payer: 59 | Admitting: Dermatology

## 2022-08-19 DIAGNOSIS — N19 Unspecified kidney failure: Secondary | ICD-10-CM

## 2022-08-19 DIAGNOSIS — T07XXXA Unspecified multiple injuries, initial encounter: Secondary | ICD-10-CM

## 2022-08-19 DIAGNOSIS — R21 Rash and other nonspecific skin eruption: Secondary | ICD-10-CM | POA: Diagnosis not present

## 2022-08-19 DIAGNOSIS — L299 Pruritus, unspecified: Secondary | ICD-10-CM | POA: Diagnosis not present

## 2022-08-19 DIAGNOSIS — L280 Lichen simplex chronicus: Secondary | ICD-10-CM

## 2022-08-19 MED ORDER — CLINDAMYCIN PHOSPHATE 1 % EX GEL: Freq: Two times a day (BID) | CUTANEOUS | 1 refills | Status: DC

## 2022-08-19 MED ORDER — CLOBETASOL PROPIONATE 0.05 % EX SOLN: 1.0000 | Freq: Two times a day (BID) | CUTANEOUS | 1 refills | Status: DC

## 2022-08-19 NOTE — Patient Instructions (Addendum)
Eczema Skin Care  Buy TWO 16oz jars of CeraVe moisturizing cream  [facility], [facility], [facility] (no prescription needed)  Costs about $15 per jar   Jar #1: Use as a moisturizer as needed. Can be applied to any area of the body. Use twice daily to unaffected areas. Jar #2: Pour one 50ml bottle of clobetasol 0.05% solution into jar, mix well. Label this jar to indicate the medication has been added. Use twice daily to affected areas. Do not apply to face, groin or underarms.  Start clindamycin gel 1-2 times daily to bumps.   Due to recent changes in healthcare laws, you may see results of your pathology and/or laboratory studies on MyChart before the doctors have had a chance to review them. We understand that in some cases there may be results that are confusing or concerning to you. Please understand that not all results are received at the same time and often the doctors may need to interpret multiple results in order to provide you with the best plan of care or course of treatment. Therefore, we ask that you please give [place] 2 business days to thoroughly review all your results before contacting the office for clarification. Should we see a critical lab result, you will be contacted sooner.   If You Need Anything After Your Visit  If you have any questions or concerns for your doctor, please call our main line at [phone] and press option 4 to reach your doctor's medical assistant. If no one answers, please leave a voicemail as directed and we will return your call as soon as possible. Messages left after 4 pm will be answered the following business day.   You may also send [place] a message via MyChart. We typically respond to MyChart messages within 1-2 business days.  For prescription refills, please ask your pharmacy to contact our office. Our fax number is [phone].  If you have an urgent issue when the clinic is closed that cannot wait until the next business day, you can page your doctor at  the number below.    Please note that while we do our best to be available for urgent issues outside of office hours, we are not available 24/7.   If you have an urgent issue and are unable to reach [place], you may choose to seek medical care at your doctor's office, retail clinic, urgent care center, or emergency room.  If you have a medical emergency, please immediately call 911 or go to the emergency department.  Pager Numbers  - Dr. Gwen Pounds: [phone]  - Dr. Neale Burly: [phone]  - Dr. Roseanne Reno: [phone]  In the event of inclement weather, please call our main line at [phone] for an update on the status of any delays or closures.  Dermatology Medication Tips: Please keep the boxes that topical medications come in in order to help keep track of the instructions about where and how to use these. Pharmacies typically print the medication instructions only on the boxes and not directly on the medication tubes.   If your medication is too expensive, please contact our office at [phone] option 4 or send [place] a message through MyChart.   We are unable to tell what your co-pay for medications will be in advance as this is different depending on your insurance coverage. However, we may be able to find a substitute medication at lower cost or fill out paperwork to get insurance to cover a needed medication.   If a prior authorization is required  to get your medication covered by your insurance company, please allow [place] 1-2 business days to complete this process.  Drug prices often vary depending on where the prescription is filled and some pharmacies may offer cheaper prices.  The website www.goodrx.com contains coupons for medications through different pharmacies. The prices here do not account for what the cost may be with help from insurance (it may be cheaper with your insurance), but the website can give you the price if you did not use any insurance.  - You can print the  associated coupon and take it with your prescription to the pharmacy.  - You may also stop by our office during regular business hours and pick up a GoodRx coupon card.  - If you need your prescription sent electronically to a different pharmacy, notify our office through [facility] or by phone at [phone] option 4.

## 2022-08-19 NOTE — Progress Notes (Signed)
   New Patient Visit   Subjective  Donald Fernandez is a 52 y.o. male who presents for the following: itching like bugs are at shoulders and back. Patient first noticed while having dialysis about 8 months ago. He was given permethrin from PCP but does not know what it was. Patient said that he does have little white things that bite and look like a grain of sand. Worse when he's having dialysis.   Patient advises he is itching all over and it is worse at night. He has lost 100 lbs and needs to lose another 60 lbs then is hoping to get a new kidney.   The following portions of the chart were reviewed this encounter and updated as appropriate: medications, allergies, medical history  Review of Systems:  No other skin or systemic complaints except as noted in HPI or Assessment and Plan.  Objective  Well appearing patient in no apparent distress; mood and affect are within normal limits.  A focused examination was performed of the following areas: Trunk, arms, hands, legs, face and scalp  Relevant exam findings are noted in the Assessment and Plan.   Assessment & Plan   GENERALIZED ITCH WITH RASH, LIKELY SECONDARY TO ITCH Exam: Xerosis and excoriations, few lichenified papules. No signs on exam of scabies/body lice.   Differential diagnosis:  Uremic kidney disease/uremic pruritus > other cause of generalized pruritus > reactive perforating collagenosis (not appreciated today on exam)  Chronic and persistent condition with duration or expected duration over one year. Condition is bothersome/symptomatic for patient. Currently flared.  Treatment Plan: Will order CBC with differential and CMP, ferritin, TSH with reflex to T4, and parathyroid hormone Will plan to start nbUVB twice weekly. Consider a biopsy if indicated.  Recommend OTC menthol cream as needed He is already on gabapentin Could consider NAC supplementation if nephrology says ok  Start Eczema Skin Care  Buy TWO 16oz  jars of CeraVe moisturizing cream  [facility], [facility], [facility] (no prescription needed)  Costs about $15 per jar   Jar #1: Use as a moisturizer as needed. Can be applied to any area of the body. Use twice daily to unaffected areas. Jar #2: Pour one 50ml bottle of clobetasol 0.05% solution into jar, mix well. Label this jar to indicate the medication has been added. Use twice daily to affected areas. Do not apply to face, groin or underarms.  Moisturizer may burn or sting initially. Try for at least 4 weeks.   Start clindamycin gel 1-2 times daily to bumps.  Patient advised that nerves can be oversensitive with kidney disease causing the feeling of bugs on the skin   Return in about 3 weeks (around 09/09/2022) for Rash.  Anise Salvo, RMA, am acting as scribe for Darden Dates, MD .   Documentation: I have reviewed the above documentation for accuracy and completeness, and I agree with the above.  Darden Dates, MD

## 2022-08-25 ENCOUNTER — Telehealth: Payer: Self-pay | Admitting: Dermatology

## 2022-08-25 NOTE — Telephone Encounter (Signed)
Please put lab slips at the front desk and let him know they are there for him to pick up and take to the lab. Please also recommend OTC menthol cream like sarna as needed for itch. Thank you!

## 2022-09-09 ENCOUNTER — Ambulatory Visit (INDEPENDENT_AMBULATORY_CARE_PROVIDER_SITE_OTHER): Payer: 59 | Admitting: Dermatology

## 2022-09-09 DIAGNOSIS — L299 Pruritus, unspecified: Secondary | ICD-10-CM

## 2022-09-09 DIAGNOSIS — R21 Rash and other nonspecific skin eruption: Secondary | ICD-10-CM

## 2022-09-09 NOTE — Progress Notes (Signed)
   Follow-Up Visit   Subjective  Donald Fernandez is a 52 y.o. male who presents for the following: 3 week rash follow up. Patient just got rx for clobetasol and clindamycin mix last week. Patient said that he had some of the little white things that look like match heads that are coming out of his skin but forgot to bring them with him. He said that when he blows his nose now they come out of his nose. Bumps have gone down since using the prescriptions.    The following portions of the chart were reviewed this encounter and updated as appropriate: medications, allergies, medical history  Review of Systems:  No other skin or systemic complaints except as noted in HPI or Assessment and Plan.  Objective  Well appearing patient in no apparent distress; mood and affect are within normal limits.   A focused examination was performed of the following areas: Arms, legs, back  Relevant exam findings are noted in the Assessment and Plan.    Assessment & Plan   GENERALIZED ITCH WITH REPORTS OF PULLING WHITE THINGS OUT OF HIS SKIN  Exam: Xerosis but otherwise clear in examined areas today. Improved from prior.    Differential diagnosis:  Uremic kidney disease/uremic pruritus > other cause of generalized pruritus > reactive perforating collagenosis (not appreciated today on exam)  Patient also very likely with Morgellon's disease, but this can sometimes clear if an underlying cause of itch or sensory disturbance is resolved   He was not able to get his blood work yet as he did not get the voicemail to pick up lab slip.   Chronic and persistent condition with duration or expected duration over one year. Condition is bothersome/symptomatic for patient. Currently flared.    Treatment Plan: Continue clobetasol/CeraVe mix twice daily to affected areas as needed Continue clindamycin 1-2 times daily to bumps.  Patient to get CBC with differential and CMP, ferritin, TSH with reflex to T4, and  parathyroid hormone. Order given to patient today.  Will plan to start nbUVB twice weekly.  Consider a biopsy if not improving.   Recommend OTC menthol cream as needed  He is already on gabapentin  Will reach out to nephrology and inquire if NAC supplementation would be okay.   Discussed if pulling white things out of skin not improving consider referring to psychiatry for discussion of pimozide.   If lab work does not support uremic pruritus or if pruritus not improving, consider further evaluation for generalized pruritus including malignancy evaluation  Return in about 3 months (around 12/10/2022) for Dr. Katrinka Blazing.  Anise Salvo, RMA, am acting as scribe for Darden Dates, MD .   Documentation: I have reviewed the above documentation for accuracy and completeness, and I agree with the above.  Darden Dates, MD

## 2022-09-09 NOTE — Patient Instructions (Addendum)
Continue Eczema Skin Care -  Jar #1: Use as a moisturizer as needed. Can be applied to any area of the body. Use twice daily to unaffected areas. Jar #2: Pour one 50ml bottle of clobetasol 0.05% solution into jar, mix well. Label this jar to indicate the medication has been added. Use twice daily to affected areas. Do not apply to face, groin or underarms.  Moisturizer may burn or sting initially. Try for at least 4 weeks.   Continue clindamycin gel 1-2 times daily to bumps.  Due to recent changes in healthcare laws, you may see results of your pathology and/or laboratory studies on MyChart before the doctors have had a chance to review them. We understand that in some cases there may be results that are confusing or concerning to you. Please understand that not all results are received at the same time and often the doctors may need to interpret multiple results in order to provide you with the best plan of care or course of treatment. Therefore, we ask that you please give [place] 2 business days to thoroughly review all your results before contacting the office for clarification. Should we see a critical lab result, you will be contacted sooner.   If You Need Anything After Your Visit  If you have any questions or concerns for your doctor, please call our main line at [phone] and press option 4 to reach your doctor's medical assistant. If no one answers, please leave a voicemail as directed and we will return your call as soon as possible. Messages left after 4 pm will be answered the following business day.   You may also send [place] a message via MyChart. We typically respond to MyChart messages within 1-2 business days.  For prescription refills, please ask your pharmacy to contact our office. Our fax number is [phone].  If you have an urgent issue when the clinic is closed that cannot wait until the next business day, you can page your doctor at the number below.    Please note that  while we do our best to be available for urgent issues outside of office hours, we are not available 24/7.   If you have an urgent issue and are unable to reach [place], you may choose to seek medical care at your doctor's office, retail clinic, urgent care center, or emergency room.  If you have a medical emergency, please immediately call 911 or go to the emergency department.  Pager Numbers  - Dr. Gwen Pounds: [phone]  - Dr. Neale Burly: [phone]  - Dr. Roseanne Reno: [phone]  In the event of inclement weather, please call our main line at [phone] for an update on the status of any delays or closures.  Dermatology Medication Tips: Please keep the boxes that topical medications come in in order to help keep track of the instructions about where and how to use these. Pharmacies typically print the medication instructions only on the boxes and not directly on the medication tubes.   If your medication is too expensive, please contact our office at [phone] option 4 or send [place] a message through MyChart.   We are unable to tell what your co-pay for medications will be in advance as this is different depending on your insurance coverage. However, we may be able to find a substitute medication at lower cost or fill out paperwork to get insurance to cover a needed medication.   If a prior authorization is required to get your medication covered by your insurance  company, please allow [place] 1-2 business days to complete this process.  Drug prices often vary depending on where the prescription is filled and some pharmacies may offer cheaper prices.  The website www.goodrx.com contains coupons for medications through different pharmacies. The prices here do not account for what the cost may be with help from insurance (it may be cheaper with your insurance), but the website can give you the price if you did not use any insurance.  - You can print the associated coupon and take it with your  prescription to the pharmacy.  - You may also stop by our office during regular business hours and pick up a GoodRx coupon card.  - If you need your prescription sent electronically to a different pharmacy, notify our office through [facility] or by phone at [phone] option 4.

## 2022-09-13 ENCOUNTER — Encounter: Payer: Self-pay | Admitting: *Deleted

## 2022-09-14 ENCOUNTER — Other Ambulatory Visit: Payer: Self-pay | Admitting: Dermatology

## 2022-09-16 ENCOUNTER — Telehealth: Payer: Self-pay

## 2022-09-16 ENCOUNTER — Telehealth: Payer: Self-pay | Admitting: Dermatology

## 2022-09-16 NOTE — Telephone Encounter (Signed)
Please call patient and let him know Dr. Thedore Mins said it is okay for [place] to add the N-acetylcysteine supplement 600 mg 3 times a day for him. I believe this will help him with his condition. It is not covered by insurance. They have it at [employer] and online.   Thank you!

## 2022-09-16 NOTE — Telephone Encounter (Signed)
Discussed Dr. Onnie Boer recommendation and sent patient a Mychart message with supplement information.

## 2022-09-17 ENCOUNTER — Other Ambulatory Visit: Payer: Self-pay | Admitting: Dermatology

## 2022-09-21 ENCOUNTER — Telehealth: Payer: Self-pay

## 2022-09-21 NOTE — Telephone Encounter (Signed)
Patient called to let us know his insurance will not cover light box treatment, Dr Neale Burly had recommended light treatment at his last office visit.    Pt also lost his lab order and would like a new lab order, discussed with pt a new lab order will be left upfront office for him to come pick up.

## 2022-09-22 NOTE — Telephone Encounter (Signed)
Thank you. Recommend continued management as per his last note and the N-acetylcysteine (NAC) 600 mg supplement three times per day if he has not started this. Thank you!

## 2022-11-03 LAB — CBC WITH DIFFERENTIAL/PLATELET
Basophils Absolute: 0.1 10*3/uL (ref 0.0–0.2)
Basos: 1 %
EOS (ABSOLUTE): 0.3 10*3/uL (ref 0.0–0.4)
Eos: 4 %
Hematocrit: 32.4 % — ABNORMAL LOW (ref 37.5–51.0)
Hemoglobin: 10.4 g/dL — ABNORMAL LOW (ref 13.0–17.7)
Immature Grans (Abs): 0 10*3/uL (ref 0.0–0.1)
Immature Granulocytes: 0 %
Lymphocytes Absolute: 0.6 10*3/uL — ABNORMAL LOW (ref 0.7–3.1)
Lymphs: 8 %
MCH: 29.1 pg (ref 26.6–33.0)
MCHC: 32.1 g/dL (ref 31.5–35.7)
MCV: 91 fL (ref 79–97)
Monocytes Absolute: 0.6 10*3/uL (ref 0.1–0.9)
Monocytes: 8 %
Neutrophils Absolute: 5.8 10*3/uL (ref 1.4–7.0)
Neutrophils: 79 %
Platelets: 229 10*3/uL (ref 150–450)
RBC: 3.57 x10E6/uL — ABNORMAL LOW (ref 4.14–5.80)
RDW: 15.7 % — ABNORMAL HIGH (ref 11.6–15.4)
WBC: 7.3 10*3/uL (ref 3.4–10.8)

## 2022-11-03 LAB — COMPREHENSIVE METABOLIC PANEL
ALT: 10 IU/L (ref 0–44)
AST: 12 IU/L (ref 0–40)
Albumin: 4.3 g/dL (ref 3.8–4.9)
Alkaline Phosphatase: 54 IU/L (ref 44–121)
BUN/Creatinine Ratio: 4 — ABNORMAL LOW (ref 9–20)
BUN: 44 mg/dL — ABNORMAL HIGH (ref 6–24)
Bilirubin Total: 0.3 mg/dL (ref 0.0–1.2)
CO2: 25 mmol/L (ref 20–29)
Calcium: 9.4 mg/dL (ref 8.7–10.2)
Chloride: 101 mmol/L (ref 96–106)
Creatinine, Ser: 11.19 mg/dL — ABNORMAL HIGH (ref 0.76–1.27)
Globulin, Total: 2.4 g/dL (ref 1.5–4.5)
Glucose: 109 mg/dL — ABNORMAL HIGH (ref 70–99)
Potassium: 4.9 mmol/L (ref 3.5–5.2)
Sodium: 143 mmol/L (ref 134–144)
Total Protein: 6.7 g/dL (ref 6.0–8.5)
eGFR: 5 mL/min/{1.73_m2} — ABNORMAL LOW (ref >59)

## 2022-11-03 LAB — TSH RFX ON ABNORMAL TO FREE T4: TSH: 2.42 u[IU]/mL (ref 0.450–4.500)

## 2022-11-03 LAB — FERRITIN: Ferritin: 1244 ng/mL — ABNORMAL HIGH (ref 30–400)

## 2022-11-03 LAB — PARATHYROID HORMONE, INTACT (NO CA): PTH: 376 pg/mL — ABNORMAL HIGH (ref 15–65)

## 2022-12-09 ENCOUNTER — Encounter: Payer: Self-pay | Admitting: Dermatology

## 2022-12-09 ENCOUNTER — Ambulatory Visit (INDEPENDENT_AMBULATORY_CARE_PROVIDER_SITE_OTHER): Payer: 59 | Admitting: Dermatology

## 2022-12-09 DIAGNOSIS — Z992 Dependence on renal dialysis: Secondary | ICD-10-CM

## 2022-12-09 DIAGNOSIS — L299 Pruritus, unspecified: Secondary | ICD-10-CM

## 2022-12-09 DIAGNOSIS — N186 End stage renal disease: Secondary | ICD-10-CM | POA: Diagnosis not present

## 2022-12-09 NOTE — Progress Notes (Signed)
   Follow-Up Visit   Subjective  Donald Fernandez is a 52 y.o. male who presents for the following: 3 month follow up for itch. Patient advises he can rub "things" off of his face, head and body right after getting out of the shower and washing real good. He washes his sheets and in the mornings there are specks on the sheets. Also notices little white things coming out of his nose when he blows it. Feels like something is biting him.   Patient is using clindamycin gel and clobetasol solution once daily. Patient also advises he has been using permethrin a few times monthly from the neck down and leaves on over night.   Patient started dialysis 5-6 years ago and is scheduled this coming Monday to have new port put in.   The patient has spots, moles and lesions to be evaluated, some may be new or changing and the patient may have concern these could be cancer.   The following portions of the chart were reviewed this encounter and updated as appropriate: medications, allergies, medical history  Review of Systems:  No other skin or systemic complaints except as noted in HPI or Assessment and Plan.  Objective  Well appearing patient in no apparent distress; mood and affect are within normal limits.   A focused examination was performed of the following areas: Chest, abdomen, arms, legs, back, face, scalp  Relevant exam findings are noted in the Assessment and Plan.    Assessment & Plan   GENERALIZED ITCH with report of insect bites and white material coming out of the skin, chronic, persistent, not at patient goal  Exam: clinically normal skin on head, neck, trunk, exposed extremities  Patient brought a few samples of what he said has been coming out of his skin. Samples were looked at under the microscope with mineral oil. Patient was advised that what he brought in is unclear but they are not arthropods or do not appear to be related to arthropods (shells, eggs, etc).  Try Dermeleve  for itch, apply to affected areas as needed discussed that patients with ESRD can have chronic pruritus even while on dialysis "White material" coming out of the skin could be calcinosis cutis, but there is no evidence of this on exam  Return in about 6 months (around 06/10/2023) for itch.  Anise Salvo, RMA, am acting as scribe for Elie Goody, MD .   Documentation: I have reviewed the above documentation for accuracy and completeness, and I agree with the above.  Elie Goody, MD

## 2022-12-09 NOTE — Patient Instructions (Addendum)

## 2022-12-20 ENCOUNTER — Telehealth: Payer: Self-pay

## 2022-12-20 DIAGNOSIS — R21 Rash and other nonspecific skin eruption: Secondary | ICD-10-CM

## 2022-12-20 NOTE — Telephone Encounter (Signed)
Patient called and asked for referral to [facility] for second opinion.

## 2022-12-22 NOTE — Telephone Encounter (Signed)
Referral faxed to [facility] at [phone].

## 2022-12-27 ENCOUNTER — Telehealth: Payer: Self-pay

## 2022-12-27 NOTE — Telephone Encounter (Signed)
Patient called and stated that his referral to [facility] was not until November 25. He would like a referral scheduled somewhere else. Patient will send a MyChart message with preference, [facility] vs [facility] vs other.

## 2022-12-28 ENCOUNTER — Telehealth: Payer: Self-pay

## 2022-12-28 DIAGNOSIS — Z967 Presence of other bone and tendon implants: Secondary | ICD-10-CM | POA: Insufficient documentation

## 2022-12-28 DIAGNOSIS — G8929 Other chronic pain: Secondary | ICD-10-CM | POA: Insufficient documentation

## 2022-12-28 NOTE — Telephone Encounter (Signed)
Patient advised of information per Dr. Katrinka Blazing. The referral to [facility] was 1 year out. Patient is asking can we send referral to another dermatologist?

## 2022-12-28 NOTE — Telephone Encounter (Signed)
Patient called and left nurse voicemail that he showed a friend the things he pulled out of his skin and nose. He states his friend's son had the same thing and diagnosed with demodex and treated with salicylic acid shampoo 3%.  Can you please advise me how to respond to patent?

## 2022-12-29 NOTE — Telephone Encounter (Signed)
Patient advised that he can call himself and schedule dermatology appointment and we will send medical records to new office. aw

## 2023-02-07 ENCOUNTER — Emergency Department
Admission: EM | Admit: 2023-02-07 | Discharge: 2023-02-07 | Disposition: A | Discharge status: Home / Self Care | Payer: 59 | Attending: Emergency Medicine | Admitting: Emergency Medicine

## 2023-02-07 ENCOUNTER — Emergency Department: Payer: 59

## 2023-02-07 DIAGNOSIS — R7989 Other specified abnormal findings of blood chemistry: Secondary | ICD-10-CM | POA: Diagnosis not present

## 2023-02-07 DIAGNOSIS — R0602 Shortness of breath: Secondary | ICD-10-CM | POA: Insufficient documentation

## 2023-02-07 DIAGNOSIS — N186 End stage renal disease: Secondary | ICD-10-CM | POA: Insufficient documentation

## 2023-02-07 DIAGNOSIS — Z992 Dependence on renal dialysis: Secondary | ICD-10-CM | POA: Insufficient documentation

## 2023-02-07 LAB — COMPREHENSIVE METABOLIC PANEL
ALT: 17 U/L (ref 0–44)
AST: 15 U/L (ref 15–41)
Albumin: 3.7 g/dL (ref 3.5–5.0)
Alkaline Phosphatase: 42 U/L (ref 38–126)
Anion gap: 16 — ABNORMAL HIGH (ref 5–15)
BUN: 71 mg/dL — ABNORMAL HIGH (ref 6–20)
CO2: 25 mmol/L (ref 22–32)
Calcium: 9.1 mg/dL (ref 8.9–10.3)
Chloride: 101 mmol/L (ref 98–111)
Creatinine, Ser: 15.16 mg/dL — ABNORMAL HIGH (ref 0.61–1.24)
GFR, Estimated: 3 mL/min — ABNORMAL LOW (ref >60)
Glucose, Bld: 139 mg/dL — ABNORMAL HIGH (ref 70–99)
Potassium: 5.4 mmol/L — ABNORMAL HIGH (ref 3.5–5.1)
Sodium: 142 mmol/L (ref 135–145)
Total Bilirubin: 0.8 mg/dL (ref 0.3–1.2)
Total Protein: 6.9 g/dL (ref 6.5–8.1)

## 2023-02-07 LAB — TROPONIN I (HIGH SENSITIVITY)
Troponin I (High Sensitivity): 45 ng/L — ABNORMAL HIGH (ref <18)
Troponin I (High Sensitivity): 48 ng/L — ABNORMAL HIGH (ref <18)

## 2023-02-07 LAB — CBC WITH DIFFERENTIAL/PLATELET
Abs Immature Granulocytes: 0.06 10*3/uL (ref 0.00–0.07)
Basophils Absolute: 0.1 10*3/uL (ref 0.0–0.1)
Basophils Relative: 0 %
Eosinophils Absolute: 0.4 10*3/uL (ref 0.0–0.5)
Eosinophils Relative: 3 %
HCT: 30.8 % — ABNORMAL LOW (ref 39.0–52.0)
Hemoglobin: 10 g/dL — ABNORMAL LOW (ref 13.0–17.0)
Immature Granulocytes: 0 %
Lymphocytes Relative: 8 %
Lymphs Abs: 1 10*3/uL (ref 0.7–4.0)
MCH: 29.2 pg (ref 26.0–34.0)
MCHC: 32.5 g/dL (ref 30.0–36.0)
MCV: 90.1 fL (ref 80.0–100.0)
Monocytes Absolute: 0.7 10*3/uL (ref 0.1–1.0)
Monocytes Relative: 5 %
Neutro Abs: 11.3 10*3/uL — ABNORMAL HIGH (ref 1.7–7.7)
Neutrophils Relative %: 84 %
Platelets: 208 10*3/uL (ref 150–400)
RBC: 3.42 MIL/uL — ABNORMAL LOW (ref 4.22–5.81)
RDW: 16.6 % — ABNORMAL HIGH (ref 11.5–15.5)
WBC: 13.6 10*3/uL — ABNORMAL HIGH (ref 4.0–10.5)
nRBC: 0 % (ref 0.0–0.2)

## 2023-02-07 LAB — MAGNESIUM: Magnesium: 2.2 mg/dL (ref 1.7–2.4)

## 2023-02-07 LAB — HEPATITIS B SURFACE ANTIGEN: Hepatitis B Surface Ag: NONREACTIVE

## 2023-02-07 MED ORDER — PENTAFLUOROPROP-TETRAFLUOROETH EX AERO
1.0000 | INHALATION_SPRAY | CUTANEOUS | Status: DC | PRN
Start: 2023-02-07 — End: 2023-02-07

## 2023-02-07 MED ORDER — HEPARIN SODIUM (PORCINE) 1000 UNIT/ML DIALYSIS: 1000.0000 [IU] | INTRAMUSCULAR | Status: DC | PRN

## 2023-02-07 MED ORDER — HEPARIN SODIUM (PORCINE) 1000 UNIT/ML DIALYSIS: 50.0000 [IU]/kg | INTRAMUSCULAR | Status: DC | PRN

## 2023-02-07 MED ORDER — ALTEPLASE 2 MG IJ SOLR: 2.0000 mg | Freq: Once | INTRAMUSCULAR | Status: DC | PRN

## 2023-02-07 MED ORDER — ANTICOAGULANT SODIUM CITRATE 4% (200MG/5ML) IV SOLN: 5.0000 mL | Status: DC | PRN

## 2023-02-07 MED ORDER — NITROGLYCERIN 2 % TD OINT
1.0000 [in_us] | TOPICAL_OINTMENT | Freq: Once | TRANSDERMAL | Status: AC
  Administered 2023-02-07: 1 [in_us] via TOPICAL
  Filled 2023-02-07: qty 1

## 2023-02-07 MED ORDER — LIDOCAINE-PRILOCAINE 2.5-2.5 % EX CREA: 1.0000 | TOPICAL_CREAM | CUTANEOUS | Status: DC | PRN

## 2023-02-07 MED ORDER — LIDOCAINE HCL (PF) 1 % IJ SOLN
5.0000 mL | INTRAMUSCULAR | Status: DC | PRN
Start: 2023-02-07 — End: 2023-02-07

## 2023-02-07 MED ORDER — NEPRO/CARBSTEADY PO LIQD
237.0000 mL | ORAL | Status: DC | PRN
Start: 2023-02-07 — End: 2023-02-07

## 2023-02-07 MED ORDER — PENTAFLUOROPROP-TETRAFLUOROETH EX AERO
INHALATION_SPRAY | CUTANEOUS | Status: AC
  Filled 2023-02-07: qty 30

## 2023-02-07 NOTE — Progress Notes (Addendum)
Hemodialysis note  Received patient in bed to unit. Alert and oriented.  Informed consent signed and in chart.  Treatment initiated: 1001 Treatment completed: 1419  Patient tolerated well. Transported back to room, alert without acute distress.  Report given to patient's RN.   Access used: LUA AVF Access issues: none  Total UF removed: 4L Medication(s) given:  none  Post HD weight: 145.2 kg   [facility]

## 2023-02-07 NOTE — ED Triage Notes (Signed)
Pt BIB from home by [facility] for [facility] upon waking up. No home O2, CPAP @ night, woke up SHOB than normal, EMS found RA sat of 58%. 98% on EMS CPAP. EMS reports bilateral diminished lower lobes, and upper rt lobe. Dialysis appt this am. VS 191/126, HR 80.

## 2023-02-07 NOTE — ED Provider Notes (Signed)
[facility] Provider Note    Event Date/Time   First MD Initiated Contact with Patient 02/07/23 0600     (approximate)   History   Shortness of Breath   HPI  Donald Fernandez is a 52 y.o. male who presents to the ED for evaluation of Shortness of Breath   I reviewed nephrology clinic visit from January.  ESRD on hemodialysis, left AVF, MWF.   Who presents to the ED from home via EMS for evaluation of shortness of breath.  Reports compliance with dialysis last week, no recent illnesses or acute events over the weekend but awoke from sleep this morning short of breath, worsening when he took off his home CPAP for OSA.  EMS reports finding him with sats in the 50s-60s, improving with EMS CPAP.   Here in the ED he reports shortness of breath without chest pain or recent illnesses.  No fevers  Physical Exam   Triage Vital Signs: ED Triage Vitals  Encounter Vitals Group     BP      Systolic BP Percentile      Diastolic BP Percentile      Pulse      Resp      Temp      Temp src      SpO2      Weight      Height      Head Circumference      Peak Flow      Pain Score      Pain Loc      Pain Education      Exclude from Growth Chart     Most recent vital signs: Vitals:   02/07/23 0630 02/07/23 0642  BP: (!) 145/98   Pulse: 79   Resp: 18   Temp:  97.9 F (36.6 C)  SpO2: 98%     General: Awake, no distress.  Sitting upright CV:  Good peripheral perfusion.  Resp:  Mild tachypnea but no distress.  No wheezing. Abd:  No distention.  MSK:  No deformity noted.  Neuro:  No focal deficits appreciated. Other:     ED Results / Procedures / Treatments   Labs (all labs ordered are listed, but only abnormal results are displayed) Labs Reviewed  COMPREHENSIVE METABOLIC PANEL - Abnormal; Notable for the following components:      Result Value   Potassium 5.4 (*)    Glucose, Bld 139 (*)    BUN 71 (*)    Creatinine, Ser 15.16 (*)    GFR,  Estimated 3 (*)    Anion gap 16 (*)    All other components within normal limits  CBC WITH DIFFERENTIAL/PLATELET - Abnormal; Notable for the following components:   WBC 13.6 (*)    RBC 3.42 (*)    Hemoglobin 10.0 (*)    HCT 30.8 (*)    RDW 16.6 (*)    Neutro Abs 11.3 (*)    All other components within normal limits  TROPONIN I (HIGH SENSITIVITY) - Abnormal; Notable for the following components:   Troponin I (High Sensitivity) 45 (*)    All other components within normal limits  MAGNESIUM    EKG Sinus rhythm with a rate of 89 bpm.  Normal axis and intervals.  No STEMI.  RADIOLOGY 1 view CXR with pulm vascular congestion, reviewed by me  Official radiology report(s): DG Chest Portable 1 View  Result Date: 02/07/2023 CLINICAL DATA:  52 year old male dialysis patient, shortness of  breath. EXAM: PORTABLE CHEST 1 VIEW COMPARISON:  Portable chest 12/08/2019. CT Abdomen and Pelvis 11/14/2020. FINDINGS: Portable AP semi upright view at 0636 hours. New circumscribed round hyperdense mass projects over the right carina and right mediastinum near the hilum, about 4.4 cm diameter. Etiology and significance are unclear. Underlying tracheal air column otherwise maintained and normal. Lower lung volumes. Other mediastinal contours are stable. Generalized indistinct pulmonary vasculature now compared to 2021. No pneumothorax. No consolidation. Small pleural effusions difficult to exclude. New left subclavian vascular stent. No acute osseous abnormality identified. IMPRESSION: 1. New since 2022 round and circumscribed 4.4 cm mass projecting at the right mediastinum partially over the carina. Recommend checking the patient for external artifact, repeat portable chest if necessary. And if the lesion persists then Chest CT recommended to further characterize. 2. Evidence of pulmonary interstitial edema. Small pleural effusions difficult to exclude. Electronically Signed   By: Odessa Fleming M.D.   On: 02/07/2023  06:57    PROCEDURES and INTERVENTIONS:  .Critical Care  Performed by: Delton Prairie, MD Authorized by: Delton Prairie, MD   Critical care provider statement:    Critical care time (minutes):  30   Critical care time was exclusive of:  Separately billable procedures and treating other patients   Critical care was necessary to treat or prevent imminent or life-threatening deterioration of the following conditions:  Respiratory failure   Critical care was time spent personally by me on the following activities:  Development of treatment plan with patient or surrogate, discussions with consultants, evaluation of patient's response to treatment, examination of patient, ordering and review of laboratory studies, ordering and review of radiographic studies, ordering and performing treatments and interventions, pulse oximetry, re-evaluation of patient's condition and review of old charts .1-3 Lead EKG Interpretation  Performed by: Delton Prairie, MD Authorized by: Delton Prairie, MD     Interpretation: normal     ECG rate:  80   ECG rate assessment: normal     Rhythm: sinus rhythm     Ectopy: none     Conduction: normal     Medications  nitroGLYCERIN (NITROGLYN) 2 % ointment 1 inch (1 inch Topical Given 02/07/23 0617)     IMPRESSION / MDM / ASSESSMENT AND PLAN / ED COURSE  I reviewed the triage vital signs and the nursing notes.  Differential diagnosis includes, but is not limited to, ACS, PTX, PNA, muscle strain/spasm, PE, dissection, anxiety, pleural effusion  {Patient presents with symptoms of an acute illness or injury that is potentially life-threatening.  Dialysis patient presents to the ED with evidence of volume overload causing hypoxic respiratory failure.  Improving clinical picture and he looks well after being placed on BiPAP and nitro paste applied to his chest.  EKG is nonischemic and first troponin is only mildly elevated, we will trend this but I doubt ACS, most likely volume  overload.  Normocytic anemia without indications for transfusions, no symptoms of bleeding.  I consult with nephrology, as below, who agrees to assist with dialysis.  He is signed out to oncoming ED physician, after hemodialysis if this is well-tolerated he may be suitable for subsequent outpatient management.  Clinical Course as of 02/07/23 0716  Mon Feb 07, 2023  0709 I consult with Dr. Wynelle Link, he will work on getting patient dialyzed today [DS]  0713 1 view CXR as noted I go to the bedside and reassessed the patient.  He does have a circular stone necklace on.  I discussed with the patient repeat  x-ray and he is agreeable [DS]    Clinical Course User Index [DS] Delton Prairie, MD     FINAL CLINICAL IMPRESSION(S) / ED DIAGNOSES   Final diagnoses:  ESRD (end stage renal disease) on dialysis [facility])     Rx / DC Orders   ED Discharge Orders     None        Note:  This document was prepared using Dragon voice recognition software and may include unintentional dictation errors.   Delton Prairie, MD 02/07/23 [phone]

## 2023-02-07 NOTE — ED Provider Notes (Signed)
Has come back from dialysis and is feeling very well.  Breathing comfortably.  Normal vital signs.  Discharge.   Pilar Jarvis, MD 02/07/23 [phone]

## 2023-02-07 NOTE — Progress Notes (Signed)
[facility]  ROUNDING NOTE   Subjective:   Donald Fernandez is a 52 y.o. male with end stage renal disease on hemodialysis, hypertension, hyperlipidemia, BPH, diabetes mellitus type II, GERd, gout, sleep apnea. Patient presents to ED with shortness of breath and requires Bi pap.   Patient is known to our clinic and receives outpatient dialysis treatments at [facility] on a MWF. States he did not consume excess fluids or sodium this weekend. Chest xray shows pulmonary interstitial edema. Potassium 5.4 on arrival.  We have been consulted to manage dialysis.    Objective:  Vital signs in last 24 hours:  Temp:  [97.8 F (36.6 C)-98 F (36.7 C)] 97.8 F (36.6 C) (10/28 0938) Pulse Rate:  [69-92] 78 (10/28 1200) Resp:  [15-28] 18 (10/28 1200) BP: (136-181)/(94-108) 149/108 (10/28 1200) SpO2:  [97 %-100 %] 97 % (10/28 1200) FiO2 (%):  [35 %] 35 % (10/28 1200) Weight:  [147 kg] 147 kg (10/28 0602)  Weight change:  Filed Weights   02/07/23 0602  Weight: (!) 147 kg    Intake/Output: No intake/output data recorded.   Intake/Output this shift:  No intake/output data recorded.  Physical Exam: General: NAD  Head: Normocephalic, atraumatic.   Eyes: Anicteric  Neck: Supple  Lungs:  Crackles in bases, Bipap in place  Heart: Regular rate and rhythm  Abdomen:  Soft, nontender, obese  Extremities:  No peripheral edema.  Neurologic: Alert, moving all four extremities  Skin: No lesions  Access: Lt AVF    Basic Metabolic Panel: Recent Labs  Lab 02/07/23 0606  NA 142  K 5.4*  CL 101  CO2 25  GLUCOSE 139*  BUN 71*  CREATININE 15.16*  CALCIUM 9.1  MG 2.2    Liver Function Tests: Recent Labs  Lab 02/07/23 0606  AST 15  ALT 17  ALKPHOS 42  BILITOT 0.8  PROT 6.9  ALBUMIN 3.7   No results for input(s): "LIPASE", "AMYLASE" in the last 168 hours. No results for input(s): "AMMONIA" in the last 168 hours.  CBC: Recent Labs  Lab 02/07/23 0606  WBC  13.6*  NEUTROABS 11.3*  HGB 10.0*  HCT 30.8*  MCV 90.1  PLT 208    Cardiac Enzymes: No results for input(s): "CKTOTAL", "CKMB", "CKMBINDEX", "TROPONINI" in the last 168 hours.  BNP: Invalid input(s): "POCBNP"  CBG: No results for input(s): "GLUCAP" in the last 168 hours.  Microbiology: Results for orders placed or performed during the hospital encounter of 12/23/20  SARS CORONAVIRUS 2 (TAT 6-24 HRS) Nasopharyngeal Nasopharyngeal Swab     Status: None   Collection Time: 12/23/20  8:50 AM   Specimen: Nasopharyngeal Swab  Result Value Ref Range Status   SARS Coronavirus 2 NEGATIVE NEGATIVE Final    Comment: (NOTE) SARS-CoV-2 target nucleic acids are NOT DETECTED.  The SARS-CoV-2 RNA is generally detectable in upper and lower respiratory specimens during the acute phase of infection. Negative results do not preclude SARS-CoV-2 infection, do not rule out co-infections with other pathogens, and should not be used as the sole basis for treatment or other patient management decisions. Negative results must be combined with clinical observations, patient history, and epidemiological information. The expected result is Negative.  Fact Sheet for Patients: HairSlick.no  Fact Sheet for Healthcare Providers: quierodirigir.com  This test is not yet approved or cleared by the [place] FDA and  has been authorized for detection and/or diagnosis of SARS-CoV-2 by FDA under an Emergency Use Authorization (EUA). This EUA will  remain  in effect (meaning this test can be used) for the duration of the COVID-19 declaration under Se ction 564(b)(1) of the Act, 21 U.S.C. section 360bbb-3(b)(1), unless the authorization is terminated or revoked sooner.  Performed at [facility], [address] [place], [place] [address]     Coagulation Studies: No results for input(s): "LABPROT", "INR" in the last 72  hours.  Urinalysis: No results for input(s): "COLORURINE", "LABSPEC", "PHURINE", "GLUCOSEU", "HGBUR", "BILIRUBINUR", "KETONESUR", "PROTEINUR", "UROBILINOGEN", "NITRITE", "LEUKOCYTESUR" in the last 72 hours.  Invalid input(s): "APPERANCEUR"    Imaging: DG Chest Portable 1 View  Result Date: 02/07/2023 CLINICAL DATA:  repeat with his necklace moved to the side to help rule out mediastinal mass. EXAM: PORTABLE CHEST 1 VIEW COMPARISON:  February 07 2023 FINDINGS: The cardiomediastinal silhouette is unchanged in contour.Previously described oval mass is no longer visualized, consistent with external artifact. No large pleural effusion. No pneumothorax. Perihilar vascular congestion with vascular indistinctness and mild interstitial markings, similar in comparison to prior. IMPRESSION: 1. Previously described oval mass is no longer visualized, consistent with external artifact. 2. Perihilar vascular congestion with vascular indistinctness and mild interstitial markings, similar in comparison to prior. Findings are favored to reflect mild pulmonary edema. Electronically Signed   By: Meda Klinefelter M.D.   On: 02/07/2023 07:40   DG Chest Portable 1 View  Result Date: 02/07/2023 CLINICAL DATA:  52 year old male dialysis patient, shortness of breath. EXAM: PORTABLE CHEST 1 VIEW COMPARISON:  Portable chest 12/08/2019. CT Abdomen and Pelvis 11/14/2020. FINDINGS: Portable AP semi upright view at 0636 hours. New circumscribed round hyperdense mass projects over the right carina and right mediastinum near the hilum, about 4.4 cm diameter. Etiology and significance are unclear. Underlying tracheal air column otherwise maintained and normal. Lower lung volumes. Other mediastinal contours are stable. Generalized indistinct pulmonary vasculature now compared to 2021. No pneumothorax. No consolidation. Small pleural effusions difficult to exclude. New left subclavian vascular stent. No acute osseous abnormality  identified. IMPRESSION: 1. New since 2022 round and circumscribed 4.4 cm mass projecting at the right mediastinum partially over the carina. Recommend checking the patient for external artifact, repeat portable chest if necessary. And if the lesion persists then Chest CT recommended to further characterize. 2. Evidence of pulmonary interstitial edema. Small pleural effusions difficult to exclude. Electronically Signed   By: Odessa Fleming M.D.   On: 02/07/2023 06:57     Medications:    anticoagulant sodium citrate      alteplase, anticoagulant sodium citrate, feeding supplement (NEPRO CARB STEADY), heparin, heparin, lidocaine (PF), lidocaine-prilocaine, pentafluoroprop-tetrafluoroeth  Assessment/ Plan:  [name] is a 52 y.o.  male with end stage renal disease on hemodialysis, hypertension, hyperlipidemia, BPH, diabetes mellitus type II, GERd, gout, sleep apnea. Patient presents to ED with shortness of breath and requires Bipap.   [facility]/MWF/Lt AVF  Hyperkalemia with end stage renal disease on hemodialysis. No missed treatments. Potassium 5.4.  Will dialyze on 2K bath for this treatment to treat potassium.   2. Acute respiratory failure, requiring bipap.Chest xray shows pulmonary congestion.  Will provide urgent dialysis, UF goal 4L as tolerated. Will attempt to wean bipap during treatment.  3. Anemia of chronic kidney disease Lab Results  Component Value Date   HGB 10.0 (L) 02/07/2023    Hgb within desired range. Vella Kohler at outpatient clinic.   4. Secondary Hyperparathyroidism: with outpatient labs: PTH 170, phosphorus 5.3, calcium 9.4 on 01/24/23.   Lab Results  Component  Value Date   PTH 376 (H) 11/02/2022   CALCIUM 9.1 02/07/2023   PHOS 5.3 (H) 06/07/2017    Will continue to monitor bone minerals during this admission.      LOS: 0 Caoimhe Damron 10/28/202412:21 PM

## 2023-02-08 LAB — HEPATITIS B SURFACE ANTIBODY, QUANTITATIVE: Hep B S AB Quant (Post): 77.3 m[IU]/mL

## 2023-03-18 ENCOUNTER — Encounter: Payer: Self-pay | Admitting: Vascular Surgery

## 2023-03-18 ENCOUNTER — Other Ambulatory Visit: Payer: Self-pay | Admitting: Vascular Surgery

## 2023-03-18 DIAGNOSIS — N138 Other obstructive and reflux uropathy: Secondary | ICD-10-CM

## 2023-03-18 DIAGNOSIS — R3121 Asymptomatic microscopic hematuria: Secondary | ICD-10-CM

## 2023-03-24 ENCOUNTER — Ambulatory Visit
Admission: RE | Admit: 2023-03-24 | Discharge: 2023-03-24 | Disposition: A | Discharge status: Home / Self Care | Payer: 59 | Source: Ambulatory Visit | Attending: Vascular Surgery | Admitting: Vascular Surgery

## 2023-03-24 DIAGNOSIS — N138 Other obstructive and reflux uropathy: Secondary | ICD-10-CM

## 2023-03-24 DIAGNOSIS — R3121 Asymptomatic microscopic hematuria: Secondary | ICD-10-CM

## 2023-03-24 MED ORDER — IOPAMIDOL (ISOVUE-370) INJECTION 76%
100.0000 mL | Freq: Once | INTRAVENOUS | Status: AC | PRN
  Administered 2023-03-24: 80 mL via INTRAVENOUS

## 2023-04-11 ENCOUNTER — Other Ambulatory Visit: Payer: Self-pay | Admitting: Dermatology

## 2023-04-15 ENCOUNTER — Other Ambulatory Visit: Payer: Self-pay

## 2023-04-15 ENCOUNTER — Emergency Department: Payer: 59

## 2023-04-15 ENCOUNTER — Emergency Department
Admission: EM | Admit: 2023-04-15 | Discharge: 2023-04-16 | Disposition: A | Discharge status: Home / Self Care | Payer: 59 | Attending: Emergency Medicine | Admitting: Emergency Medicine

## 2023-04-15 DIAGNOSIS — G4733 Obstructive sleep apnea (adult) (pediatric): Secondary | ICD-10-CM | POA: Diagnosis present

## 2023-04-15 DIAGNOSIS — N186 End stage renal disease: Secondary | ICD-10-CM

## 2023-04-15 DIAGNOSIS — E8770 Fluid overload, unspecified: Secondary | ICD-10-CM | POA: Insufficient documentation

## 2023-04-15 DIAGNOSIS — R0602 Shortness of breath: Secondary | ICD-10-CM | POA: Insufficient documentation

## 2023-04-15 DIAGNOSIS — E1122 Type 2 diabetes mellitus with diabetic chronic kidney disease: Secondary | ICD-10-CM

## 2023-04-15 DIAGNOSIS — M1A30X0 Chronic gout due to renal impairment, unspecified site, without tophus (tophi): Secondary | ICD-10-CM | POA: Diagnosis present

## 2023-04-15 DIAGNOSIS — Z992 Dependence on renal dialysis: Secondary | ICD-10-CM | POA: Diagnosis not present

## 2023-04-15 DIAGNOSIS — I2510 Atherosclerotic heart disease of native coronary artery without angina pectoris: Secondary | ICD-10-CM | POA: Diagnosis present

## 2023-04-15 DIAGNOSIS — F17200 Nicotine dependence, unspecified, uncomplicated: Secondary | ICD-10-CM | POA: Diagnosis present

## 2023-04-15 LAB — CBC WITH DIFFERENTIAL/PLATELET
Abs Immature Granulocytes: 0.02 10*3/uL (ref 0.00–0.07)
Basophils Absolute: 0 10*3/uL (ref 0.0–0.1)
Basophils Relative: 0 %
Eosinophils Absolute: 0.3 10*3/uL (ref 0.0–0.5)
Eosinophils Relative: 4 %
HCT: 32.5 % — ABNORMAL LOW (ref 39.0–52.0)
Hemoglobin: 10.6 g/dL — ABNORMAL LOW (ref 13.0–17.0)
Immature Granulocytes: 0 %
Lymphocytes Relative: 14 %
Lymphs Abs: 1 10*3/uL (ref 0.7–4.0)
MCH: 30.3 pg (ref 26.0–34.0)
MCHC: 32.6 g/dL (ref 30.0–36.0)
MCV: 92.9 fL (ref 80.0–100.0)
Monocytes Absolute: 0.5 10*3/uL (ref 0.1–1.0)
Monocytes Relative: 8 %
Neutro Abs: 5.1 10*3/uL (ref 1.7–7.7)
Neutrophils Relative %: 74 %
Platelets: 221 10*3/uL (ref 150–400)
RBC: 3.5 MIL/uL — ABNORMAL LOW (ref 4.22–5.81)
RDW: 15.8 % — ABNORMAL HIGH (ref 11.5–15.5)
WBC: 7 10*3/uL (ref 4.0–10.5)
nRBC: 0 % (ref 0.0–0.2)

## 2023-04-15 LAB — COMPREHENSIVE METABOLIC PANEL
ALT: 12 U/L (ref 0–44)
AST: 14 U/L — ABNORMAL LOW (ref 15–41)
Albumin: 3.6 g/dL (ref 3.5–5.0)
Alkaline Phosphatase: 44 U/L (ref 38–126)
Anion gap: 18 — ABNORMAL HIGH (ref 5–15)
BUN: 81 mg/dL — ABNORMAL HIGH (ref 6–20)
CO2: 24 mmol/L (ref 22–32)
Calcium: 9 mg/dL (ref 8.9–10.3)
Chloride: 99 mmol/L (ref 98–111)
Creatinine, Ser: 18.34 mg/dL — ABNORMAL HIGH (ref 0.61–1.24)
GFR, Estimated: 3 mL/min — ABNORMAL LOW (ref >60)
Glucose, Bld: 110 mg/dL — ABNORMAL HIGH (ref 70–99)
Potassium: 4.4 mmol/L (ref 3.5–5.1)
Sodium: 141 mmol/L (ref 135–145)
Total Bilirubin: 0.4 mg/dL (ref 0.0–1.2)
Total Protein: 6.8 g/dL (ref 6.5–8.1)

## 2023-04-15 LAB — BRAIN NATRIURETIC PEPTIDE: B Natriuretic Peptide: 424.6 pg/mL — ABNORMAL HIGH (ref 0.0–100.0)

## 2023-04-15 LAB — CBG MONITORING, ED: Glucose-Capillary: 115 mg/dL — ABNORMAL HIGH (ref 70–99)

## 2023-04-15 MED ORDER — INSULIN ASPART 100 UNIT/ML IJ SOLN: 0.0000 [IU] | Freq: Every day | INTRAMUSCULAR | Status: DC

## 2023-04-15 MED ORDER — ALBUTEROL SULFATE HFA 108 (90 BASE) MCG/ACT IN AERS
2.0000 | INHALATION_SPRAY | RESPIRATORY_TRACT | Status: DC | PRN
Start: 2023-04-15 — End: 2023-04-16

## 2023-04-15 MED ORDER — INSULIN ASPART 100 UNIT/ML IJ SOLN: 0.0000 [IU] | Freq: Three times a day (TID) | INTRAMUSCULAR | Status: DC

## 2023-04-15 MED ORDER — CHLORHEXIDINE GLUCONATE CLOTH 2 % EX PADS
6.0000 | MEDICATED_PAD | Freq: Every day | CUTANEOUS | Status: DC
Start: 2023-04-16 — End: 2023-04-16
  Filled 2023-04-15: qty 6

## 2023-04-15 NOTE — Progress Notes (Signed)
 Referring Provider: No ref. provider found Primary Care Physician:  Tobie Domino, MD Primary Nephrologist:  Dr.   [place] for Consultation:    HPI: 53 year old male with history of hypertension, coronary artery disease, diabetes, peripheral vascular disease, hyperlipidemia, end-stage renal disease on dialysis now comes to the hospital after missing dialysis for 2 treatments.  He gives history of some shortness of breath and dyspnea on exertion.  He denies any history of fever, chills or headaches.  Past Medical History:  Diagnosis Date   Arthritis    Asthma    triggered by enviornmental allergies   BPH (benign prostatic hyperplasia)    Diabetes mellitus without complication (HCC)    Dialysis patient (HCC)    dialysis T-TH-SAT   GERD (gastroesophageal reflux disease)    Gout    Hyperlipidemia    Hypertension    Neuropathy    Renal disorder    CKD4; end stage renal disease   Restless legs    Sleep apnea    uses cpap    Past Surgical History:  Procedure Laterality Date   A/V FISTULAGRAM N/A 06/08/2017   Procedure: A/V FISTULAGRAM;  Surgeon: Marea Selinda RAMAN, MD;  Location: [facility];  Service: Cardiovascular;  Laterality: N/A;   ANKLE SURGERY Right    fracture repair with pins, plates, screws   AV FISTULA INSERTION W/ RF MAGNETIC GUIDANCE Left 09/02/2017   Procedure: AV FISTULA INSERTION W/RF MAGNETIC GUIDANCE;  Surgeon: Jama Cordella MATSU, MD;  Location: [facility];  Service: Cardiovascular;  Laterality: Left;   AV FISTULA PLACEMENT Left 08/04/2016   Procedure: ARTERIOVENOUS (AV) FISTULA CREATION ( RADIOCEPHALIC );  Surgeon: Cordella MATSU Jama, MD;  Location: [facility];  Service: Vascular;  Laterality: Left;   DIALYSIS/PERMA CATHETER INSERTION N/A 06/06/2017   Procedure: DIALYSIS/PERMA CATHETER INSERTION;  Surgeon: Marea Selinda RAMAN, MD;  Location: [facility];  Service: Cardiovascular;  Laterality: N/A;   DIALYSIS/PERMA CATHETER REMOVAL N/A 05/04/2018    Procedure: DIALYSIS/PERMA CATHETER REMOVAL;  Surgeon: Marea Selinda RAMAN, MD;  Location: [facility];  Service: Cardiovascular;  Laterality: N/A;   IR ANGIO INTRA EXTRACRAN SEL INTERNAL CAROTID UNI R MOD SED  12/17/2016   RADIOLOGY WITH ANESTHESIA N/A 12/17/2016   Procedure: Arteriogram/Venogram with right venous sinus manometry;  Surgeon: Lanis Pupa, MD;  Location: [facility];  Service: Radiology;  Laterality: N/A;   REVISON OF ARTERIOVENOUS FISTULA Left 01/04/2018   Procedure: REVISON OF ARTERIOVENOUS FISTULA ( BRACHIALCEPHALIC );  Surgeon: Jama Cordella MATSU, MD;  Location: [facility];  Service: Vascular;  Laterality: Left;   UPPER EXTREMITY ANGIOGRAPHY Left 10/19/2017   Procedure: UPPER EXTREMITY ANGIOGRAPHY;  Surgeon: Jama Cordella MATSU, MD;  Location: [facility];  Service: Cardiovascular;  Laterality: Left;    Prior to Admission medications   Medication Sig Start Date End Date Taking? Authorizing Provider  amLODipine  (NORVASC ) 10 MG tablet Take 10 mg by mouth daily. 06/05/21   [provider]  anastrozole  (ARIMIDEX ) 1 MG tablet TAKE ONE-HALF TABLET BY MOUTH ONCE DAILY 09/02/21   McKenzie, Belvie CROME, MD  aspirin  EC 81 MG tablet Take 81 mg by mouth daily.    [provider]  atorvastatin  (LIPITOR) 20 MG tablet Take 20 mg by mouth daily.  Patient not taking: Reported on 12/09/2022    [provider]  calcium  acetate (PHOSLO ) 667 MG capsule Take 2,668 mg by mouth 3 (three) times daily with meals.    [provider]  clindamycin  (CLINDAGEL) 1 %  gel APPLY TOPICALLY TO BUMPS ON SKIN TWO TIMES DAILY 04/11/23   Claudene Lehmann, MD  clobetasol  (TEMOVATE ) 0.05 % external solution MIX ENTIRE BOTTLE INTO A JAR OF CERAVE CREAM & APPLY TOPICALLY TO AFFECTED AREAS 2X/DAY, AVOID FACE/GROIN/AXILLA; LONG-TERM USE CAN CAUSE THINNING OF SKIN 09/20/22   Jackquline Sawyer, MD  cloNIDine  (CATAPRES ) 0.1 MG tablet Take by mouth.    [provider]  fluticasone   (FLONASE ) 50 MCG/ACT nasal spray Place 1 spray into both nostrils daily as needed for allergies or rhinitis.    [provider]  gabapentin  (NEURONTIN ) 100 MG capsule Take 1 capsule (100 mg total) by mouth 3 (three) times daily. 07/11/16   Sherial Bail, MD  metoprolol  succinate (TOPROL -XL) 50 MG 24 hr tablet Take 1 tablet (50 mg total) by mouth daily. Take with or immediately following a meal. 11/17/20 12/17/20  Dickie Begun, MD  omeprazole (PRILOSEC) 40 MG capsule Take 40 mg by mouth daily.    [provider]  tamsulosin  (FLOMAX ) 0.4 MG CAPS capsule Take 0.8 mg by mouth daily after breakfast.     [provider]  Testosterone  20.25 MG/ACT (1.62%) GEL Place 1 application onto the skin daily. (Apply to shoulders)    [provider]  topiramate  (TOPAMAX ) 25 MG tablet Take 50 mg by mouth daily.    [provider]  traZODone  (DESYREL ) 50 MG tablet Take 50 mg by mouth at bedtime.    [provider]    Current Facility-Administered Medications  Medication Dose Route Frequency Provider Last Rate Last Admin   albuterol  (VENTOLIN  HFA) 108 (90 Base) MCG/ACT inhaler 2 puff  2 puff Inhalation Q2H PRN Malvina Alm DASEN, MD       [START ON 04/16/2023] Chlorhexidine  Gluconate Cloth 2 % PADS 6 each  6 each Topical Q0600 Baily Serpe, MD       insulin  aspart (novoLOG ) injection 0-5 Units  0-5 Units Subcutaneous QHS Crosley, Debby, MD       [START ON 04/16/2023] insulin  aspart (novoLOG ) injection 0-9 Units  0-9 Units Subcutaneous TID WC Crosley, Debby, MD       Current Outpatient Medications  Medication Sig Dispense Refill   amLODipine  (NORVASC ) 10 MG tablet Take 10 mg by mouth daily.     anastrozole  (ARIMIDEX ) 1 MG tablet TAKE ONE-HALF TABLET BY MOUTH ONCE DAILY 45 tablet 0   aspirin  EC 81 MG tablet Take 81 mg by mouth daily.     atorvastatin  (LIPITOR) 20 MG tablet Take 20 mg by mouth daily.  (Patient not taking: Reported on 12/09/2022)     calcium  acetate  (PHOSLO ) 667 MG capsule Take 2,668 mg by mouth 3 (three) times daily with meals.  0   clindamycin  (CLINDAGEL) 1 % gel APPLY TOPICALLY TO BUMPS ON SKIN TWO TIMES DAILY 60 g 10   clobetasol  (TEMOVATE ) 0.05 % external solution MIX ENTIRE BOTTLE INTO A JAR OF CERAVE CREAM & APPLY TOPICALLY TO AFFECTED AREAS 2X/DAY, AVOID FACE/GROIN/AXILLA; LONG-TERM USE CAN CAUSE THINNING OF SKIN 50 mL 10   cloNIDine  (CATAPRES ) 0.1 MG tablet Take by mouth.     fluticasone  (FLONASE ) 50 MCG/ACT nasal spray Place 1 spray into both nostrils daily as needed for allergies or rhinitis.     gabapentin  (NEURONTIN ) 100 MG capsule Take 1 capsule (100 mg total) by mouth 3 (three) times daily. 90 capsule 2   metoprolol  succinate (TOPROL -XL) 50 MG 24 hr tablet Take 1 tablet (50 mg total) by mouth daily. Take with or immediately following a meal.  30 tablet 0   omeprazole (PRILOSEC) 40 MG capsule Take 40 mg by mouth daily.     tamsulosin  (FLOMAX ) 0.4 MG CAPS capsule Take 0.8 mg by mouth daily after breakfast.      Testosterone  20.25 MG/ACT (1.62%) GEL Place 1 application onto the skin daily. (Apply to shoulders)     topiramate  (TOPAMAX ) 25 MG tablet Take 50 mg by mouth daily.     traZODone  (DESYREL ) 50 MG tablet Take 50 mg by mouth at bedtime.      Allergies as of 04/15/2023 - Review Complete 04/15/2023  Allergen Reaction Noted   Accupril [quinapril hcl] Anaphylaxis 06/05/2017   Ace inhibitors Anaphylaxis and Other (See Comments) 06/05/2017    Family History  Problem Relation Age of Onset   AAA (abdominal aortic aneurysm) Mother    Diabetes Mother    Heart disease Father     Social History   Socioeconomic History   Marital status: Single    Spouse name: Not on file   Number of children: Not on file   Years of education: Not on file   Highest education level: Not on file  Occupational History   Not on file  Tobacco Use   Smoking status: Every Day    Types: Cigarettes   Smokeless tobacco: Never   Tobacco comments:     APPROX. 1 PK/WEEK   Vaping Use   Vaping status: Never Used  Substance and Sexual Activity   Alcohol use: Yes    Comment: weekends   Drug use: Yes    Types: Marijuana    Comment: LAST TIME APPROX. 08/03/17   Sexual activity: Yes    Birth control/protection: Condom  Other Topics Concern   Not on file  Social History Narrative   Not on file   Social Drivers of Health   Financial Resource Strain: Not on file  Food Insecurity: Not on file  Transportation Needs: Not on file  Physical Activity: Not on file  Stress: Not on file  Social Connections: Not on file  Intimate Partner Violence: Not on file    Physical Exam: Vital signs in last 24 hours: Temp:  [99.4 F (37.4 C)] 99.4 F (37.4 C) (01/03 1611) Pulse Rate:  [87] 87 (01/03 1611) Resp:  [20] 20 (01/03 1611) BP: (133)/(85) 133/85 (01/03 1611) SpO2:  [97 %] 97 % (01/03 1611) Weight:  [147 kg] 147 kg (01/03 1610)   General:   Alert,  Well-developed, well-nourished, pleasant and cooperative in NAD Head:  Normocephalic and atraumatic. Eyes:  Sclera clear, no icterus.   Conjunctiva pink. Ears:  Normal auditory acuity. Nose:  No deformity, discharge,  or lesions. Lungs:  Clear throughout to auscultation.   No wheezes, crackles, or rhonchi. No acute distress. Heart:  Regular rate and rhythm; no murmurs, clicks, rubs,  or gallops. Abdomen:  Soft, nontender and nondistended. No masses, hepatosplenomegaly or hernias noted. Normal bowel sounds, without guarding, and without rebound.   Extremities:  Without clubbing or edema.  Intake/Output from previous day: No intake/output data recorded. Intake/Output this shift: No intake/output data recorded.  Lab Results: Recent Labs    04/15/23 1615  WBC 7.0  HGB 10.6*  HCT 32.5*  PLT 221   BMET Recent Labs    04/15/23 1615  NA 141  K 4.4  CL 99  CO2 24  GLUCOSE 110*  BUN 81*  CREATININE 18.34*  CALCIUM  9.0   LFT Recent Labs    04/15/23 1615  PROT 6.8  ALBUMIN   3.6  AST 14*  ALT 12  ALKPHOS 44  BILITOT 0.4   PT/INR No results for input(s): LABPROT, INR in the last 72 hours. Hepatitis Panel No results for input(s): HEPBSAG, HCVAB, HEPAIGM, HEPBIGM in the last 72 hours.  Studies/Results: DG Chest 2 View Result Date: 04/15/2023 CLINICAL DATA:  Shortness of breath.  Last dialysis 4 days ago. EXAM: CHEST - 2 VIEW COMPARISON:  Radiographs 02/07/2023 and 12/08/2019. FINDINGS: Lordotic positioning on the AP view. Both views are limited by body habitus. The heart size and mediastinal contours are stable. No evidence of edema, confluent airspace opacity, pneumothorax or significant pleural effusion. The bones appear unchanged. IMPRESSION: No evidence of acute cardiopulmonary process. Electronically Signed   By: Elsie Perone M.D.   On: 04/15/2023 18:21    Assessment/Plan:  53 year old male with history of hypertension, coronary artery disease, diabetes, peripheral vascular disease, hyperlipidemia, end-stage renal disease on dialysis now comes to the hospital after missing dialysis for 2 treatments.  He gives history of some shortness of breath and dyspnea on exertion.  He denies any history of fever, chills or headaches  ESRD: Will dialyze emergently today and attempt to remove 3 L of fluid today.  ANEMIA: Will continue to follow anemia protocol.  MBD: Continue calcium  acetate.  HTN/VOL: Continue clonidine , metoprolol  and amlodipine .  Patient is advised on the diet.  Patient can be discharged home after dialysis if stable. Labs and medications reviewed. Will continue to monitor closely.    LOS: 0 Pinkey Edman, MD [facility] @TODAY @10 :02 PM

## 2023-04-15 NOTE — ED Provider Notes (Signed)
 [facility] Provider Note    Event Date/Time   First MD Initiated Contact with Patient 04/15/23 2114     (approximate)   History   Shortness of Breath   HPI  Donald Fernandez is a 53 y.o. male with history of ESRD on dialysis who presents to the emergency department today because of concerns for shortness of breath.  The patient has missed his last 2 dialysis sessions.  He had a prostate procedure which caused him to miss.  He has developed increasing shortness of breath.  He says it reminds him of when he is fluid overloaded.  The patient denies any chest pain.  Denies any fevers.     Physical Exam   Triage Vital Signs: ED Triage Vitals  Encounter Vitals Group     BP 04/15/23 1611 133/85     Systolic BP Percentile --      Diastolic BP Percentile --      Pulse Rate 04/15/23 1611 87     Resp 04/15/23 1611 20     Temp 04/15/23 1611 99.4 F (37.4 C)     Temp Source 04/15/23 1611 Oral     SpO2 04/15/23 1611 97 %     Weight 04/15/23 1610 (!) 324 lb 1.2 oz (147 kg)     Height 04/15/23 1610 5' 10 (1.778 m)     Head Circumference --      Peak Flow --      Pain Score 04/15/23 1611 0     Pain Loc --      Pain Education --      Exclude from Growth Chart --     Most recent vital signs: Vitals:   04/15/23 1611  BP: 133/85  Pulse: 87  Resp: 20  Temp: 99.4 F (37.4 C)  SpO2: 97%   General: Awake, alert, oriented. CV:  Good peripheral perfusion. Regular rate and rhythm. Resp:  Normal effort. Lungs clear. Abd:  No distention.    ED Results / Procedures / Treatments   Labs (all labs ordered are listed, but only abnormal results are displayed) Labs Reviewed  BRAIN NATRIURETIC PEPTIDE - Abnormal; Notable for the following components:      Result Value   B Natriuretic Peptide 424.6 (*)    All other components within normal limits  COMPREHENSIVE METABOLIC PANEL - Abnormal; Notable for the following components:   Glucose, Bld 110 (*)    BUN 81  (*)    Creatinine, Ser 18.34 (*)    AST 14 (*)    GFR, Estimated 3 (*)    Anion gap 18 (*)    All other components within normal limits  CBC WITH DIFFERENTIAL/PLATELET - Abnormal; Notable for the following components:   RBC 3.50 (*)    Hemoglobin 10.6 (*)    HCT 32.5 (*)    RDW 15.8 (*)    All other components within normal limits     EKG  I, Guadalupe Eagles, attending physician, personally viewed and interpreted this EKG  EKG Time: 1617 Rate: 82 Rhythm: normal sinus rhythm Axis: normal Intervals: qtc 399 QRS: narrow, q waves v1, v2, v3 ST changes: no st elevation Impression: abnormal ekg   RADIOLOGY I independently interpreted and visualized the CXR. My interpretation: No pneumonia Radiology interpretation:  IMPRESSION:  No evidence of acute cardiopulmonary process.      PROCEDURES:  Critical Care performed: No    MEDICATIONS ORDERED IN ED: Medications  albuterol  (VENTOLIN  HFA) 108 (90 Base)  MCG/ACT inhaler 2 puff (has no administration in time range)     IMPRESSION / MDM / ASSESSMENT AND PLAN / ED COURSE  I reviewed the triage vital signs and the nursing notes.                              Differential diagnosis includes, but is not limited to, fluid overload, pneumonia  Patient's presentation is most consistent with acute presentation with potential threat to life or bodily function.   The patient is on the cardiac monitor to evaluate for evidence of arrhythmia and/or significant heart rate changes.  Patient presented to the emergency department today because of concerns for shortness of breath.  Patient does have history of end-stage renal disease.  Chest x-ray without any significant edema.  Patient's BNP is elevated.  I discussed with Dr. Dominica with nephrology. He will put in orders for dialysis. Anticipate discharge after dialysis.       FINAL CLINICAL IMPRESSION(S) / ED DIAGNOSES   Final diagnoses:  Shortness of breath  Hypervolemia,  unspecified hypervolemia type     Note:  This document was prepared using Dragon voice recognition software and may include unintentional dictation errors.    Floy Roberts, MD 04/15/23 2215

## 2023-04-15 NOTE — ED Triage Notes (Signed)
 Patient states his last dialysis was Monday; reports shortness of breath and fluid overload.

## 2023-04-15 NOTE — ED Provider Notes (Addendum)
 11:45 PM  Assumed care at shift change.  Patient here with shortness of breath after missed dialysis.  Chest x-ray clear.  No hypoxia.  Normal blood pressure.  Normal potassium.  Patient to get dialyzed here in the emergency department and then discharged home.  3:20 AM  Pt getting dialysis.  His blood pressures however continue to rise.  He is in no distress and has no acute complaints.  He is on metoprolol , clonidine , amlodipine , hydralazine  at home.  Will give doses of his home medications here in the ED.  5:33 AM Pt's BP improving.  Patient on 3 L nasal cannula for comfort.  Does not wear oxygen chronically.  Not hypoxic here.  Will wean him off of oxygen.  6:48 AM  Pt's dialysis session has been completed.  He is not having any pain.  No shortness of breath.  No hypoxia.  Chest x-ray clear.  Potassium level prior to dialysis was normal.  I feel he is safe to be discharged and follow-up for his normally scheduled dialysis on Monday.  At this time, I do not feel there is any life-threatening condition present. I reviewed all nursing notes, vitals, pertinent previous records.  All lab and urine results, EKGs, imaging ordered have been independently reviewed and interpreted by myself.  I reviewed all available radiology reports from any imaging ordered this visit.  Based on my assessment, I feel the patient is safe to be discharged home without further emergent workup and can continue workup as an outpatient as needed. Discussed all findings, treatment plan as well as usual and customary return precautions.  They verbalize understanding and are comfortable with this plan.  Outpatient follow-up has been provided as needed.  All questions have been answered.     Fields Oros, Josette SAILOR, DO 04/16/23 [phone]

## 2023-04-15 NOTE — ED Provider Triage Note (Signed)
 Emergency Medicine Provider Triage Evaluation Note  Donald Fernandez , a 53 y.o. male  was evaluated in triage.  Pt complains of shortness of breath.  Patient last dialysis was Monday.  Patient states had prostate surgery.  Patient states today he could not have dialysis because did not have any power  Review of Systems  Positive: Negative:   Physical Exam  BP 133/85   Pulse 87   Temp 99.4 F (37.4 C) (Oral)   Resp 20   Ht 5' 10 (1.778 m)   Wt (!) 147 kg   SpO2 97%   BMI 46.50 kg/m  Gen:   Awake, no distress   Resp:  Normal effort  MSK:   Moves extremities without difficulty  Other:    Medical Decision Making  Medically screening exam initiated at 4:13 PM.  Appropriate orders placed.  Ozell DELENA Penner was informed that the remainder of the evaluation will be completed by another provider, this initial triage assessment does not replace that evaluation, and the importance of remaining in the ED until their evaluation is complete.     Janit Kast, PA-C 04/15/23 1614

## 2023-04-16 LAB — HEPATITIS B SURFACE ANTIGEN: Hepatitis B Surface Ag: NONREACTIVE

## 2023-04-16 MED ORDER — AMLODIPINE BESYLATE 5 MG PO TABS
5.0000 mg | ORAL_TABLET | Freq: Once | ORAL | Status: AC
  Administered 2023-04-16: 5 mg via ORAL
  Filled 2023-04-16: qty 1

## 2023-04-16 MED ORDER — METOPROLOL SUCCINATE ER 50 MG PO TB24: 50.0000 mg | ORAL_TABLET | Freq: Every day | ORAL | Status: DC

## 2023-04-16 MED ORDER — CLONIDINE HCL 0.1 MG PO TABS
0.1000 mg | ORAL_TABLET | Freq: Once | ORAL | Status: AC
Start: 2023-04-16 — End: 2023-04-16
  Administered 2023-04-16: 0.1 mg via ORAL
  Filled 2023-04-16: qty 1

## 2023-04-16 MED ORDER — AMLODIPINE BESYLATE 5 MG PO TABS: 10.0000 mg | ORAL_TABLET | Freq: Once | ORAL | Status: DC

## 2023-04-16 MED ORDER — HYDRALAZINE HCL 50 MG PO TABS
100.0000 mg | ORAL_TABLET | Freq: Once | ORAL | Status: AC
Start: 2023-04-16 — End: 2023-04-16
  Administered 2023-04-16: 100 mg via ORAL
  Filled 2023-04-16: qty 2

## 2023-04-16 NOTE — Discharge Instructions (Signed)
 Please complete dialysis on Monday as scheduled.

## 2023-04-16 NOTE — Progress Notes (Signed)
 Received patient in bed to unit.  Alert and oriented.  Informed consent signed and in chart.   TX duration:3 hours  Patient tolerated well. Goal up from 3000 to 4000 per request of the patient. And md approved.  Alert, without acute distress.  Hand-off given to patient's nurse.   Access used: AVF Access issues: none  Total UF removed: 4000 Medication(s) given: none Post HD VS: see table below Post HD weight: 148.4kg    04/16/23 0603  Vitals  Temp 98.6 F (37 C)  Temp Source Oral  BP 137/85  MAP (mmHg) 101  BP Location Right Wrist  BP Method Automatic  Patient Position (if appropriate) Lying  Pulse Rate 81  Pulse Rate Source Monitor  ECG Heart Rate 87  Resp 16  Oxygen Therapy  SpO2 100 %  O2 Device Nasal Cannula  O2 Flow Rate (L/min) 3 L/min  Patient Activity (if Appropriate) In bed  Pulse Oximetry Type Continuous  During Treatment Monitoring  Blood Flow Rate (mL/min) 0 mL/min  Arterial Pressure (mmHg) -2.22 mmHg  Venous Pressure (mmHg) -1.41 mmHg  TMP (mmHg) 18.58 mmHg  Ultrafiltration Rate (mL/min) 1609 mL/min  Dialysate Flow Rate (mL/min) 300 ml/min  Dialysate Potassium Concentration 3  Dialysate Calcium  Concentration 2.5  Duration of HD Treatment -hour(s) 3 hour(s)  Cumulative Fluid Removed (mL) per Treatment  4000.39  Post Treatment  Dialyzer Clearance Lightly streaked  Hemodialysis Intake (mL) 0 mL  Liters Processed 72  Fluid Removed (mL) 4000 mL  Tolerated HD Treatment Yes  Post-Hemodialysis Comments goal met  AVG/AVF Arterial Site Held (minutes) 9 minutes  AVG/AVF Venous Site Held (minutes) 9 minutes  Fistula / Graft Left Forearm Arteriovenous fistula  Placement Date/Time: 08/04/16 1128   Orientation: Left  Access Location: Forearm  Access Type: Arteriovenous fistula  Site Condition No complications  Fistula / Graft Assessment Present;Thrill;Aneurysm present;Bruit  Status Patent      [facility]

## 2023-04-19 LAB — HEPATITIS B SURFACE ANTIBODY, QUANTITATIVE: Hep B S AB Quant (Post): 51.3 m[IU]/mL

## 2023-06-07 DIAGNOSIS — E785 Hyperlipidemia, unspecified: Secondary | ICD-10-CM | POA: Insufficient documentation

## 2023-06-07 DIAGNOSIS — M5459 Other low back pain: Secondary | ICD-10-CM | POA: Insufficient documentation

## 2023-06-07 DIAGNOSIS — J449 Chronic obstructive pulmonary disease, unspecified: Secondary | ICD-10-CM | POA: Insufficient documentation

## 2023-06-07 NOTE — Progress Notes (Unsigned)
 PROVIDER NOTE: Information contained herein reflects review and annotations entered in association with encounter. Interpretation of such information and data should be left to medically-trained personnel. Information provided to patient can be located elsewhere in the medical record under "Patient Instructions". Document created using STT-dictation technology, any transcriptional errors that may result from process are unintentional.    Patient: Donald Fernandez  Service Category: E/M  Provider: Oswaldo Done, MD  DOB: 31-Jul-1970  DOS: 06/08/2023  Referring Provider: Hillery Aldo, MD  MRN: [id number]  Specialty: Interventional Pain Management  PCP: Hillery Aldo, MD  Type: Established Patient  Setting: Ambulatory outpatient    Location: Office  Delivery: Face-to-face     HPI  Donald Fernandez, a 53 y.o. year old male, is here today because of his Chronic bilateral low back pain without sciatica [M54.50, G89.29]. [name] primary complain today is No chief complaint on file.  Pertinent problems: [name] has Personal history of gout; Lumbar facet hypertrophy (Multilevel) (Bilateral); Lumbar foraminal stenosis (Multilevel) (Bilateral); DDD (degenerative disc disease), lumbosacral; Sacroiliitis (Bilateral); Osteoarthritis of lumbar spine; Osteoarthritis of facet joint of lumbar spine; Chronic pain syndrome; Chronic gout 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 tophus; Chronic low back pain (Primary Area of Pain) (Bilateral) (L>R) w/o sciatica; Lumbar facet syndrome (Bilateral) (L>R); Chronic lower extremity pain (Bilateral) (L>R); Spondylosis without myelopathy or radiculopathy, lumbosacral region; and Lumbar facet joint pain on their pertinent problem list. Pain Assessment: Severity of   is reported as a  /10. Location:    / . Onset:  . Quality:  . Timing:  . Modifying factor(s):  Marland Kitchen Vitals:  vitals were not taken for this visit.  BMI: Estimated body mass index is 46.94 kg/m as calculated from the  following:   Height as of 04/15/23: 5\' 10"  (1.778 m).   Weight as of 04/16/23: 327 lb 2.6 oz (148.4 kg). Last encounter: Visit date not found. Last procedure: Visit date not found.  Reason for encounter: evaluation of worsening, or previously known (established) problem. ***  Discussed the use of AI scribe software for clinical note transcription with the patient, who gave verbal consent to proceed.  History of Present Illness           Pharmacotherapy Assessment  Analgesic: No opioid analgesics prescribed by our practice. Oxycodone/APAP 5/325 1 tab PO 4 hours (Written by Oneita Kras, PA-C) Highest recorded MME/day: 50 mg/day MME/day: 50 mg/day   Monitoring: [place] PMP: PDMP reviewed during this encounter.       Pharmacotherapy: No side-effects or adverse reactions reported. Compliance: No problems identified. Effectiveness: Clinically acceptable.  No notes on file  No results found for: "CBDTHCR" No results found for: "D8THCCBX" No results found for: "D9THCCBX"  UDS:  No results found for: "SUMMARY"    ROS  Constitutional: Denies any fever or chills Gastrointestinal: No reported hemesis, hematochezia, vomiting, or acute GI distress Musculoskeletal: Denies any acute onset joint swelling, redness, loss of ROM, or weakness Neurological: No reported episodes of acute onset apraxia, aphasia, dysarthria, agnosia, amnesia, paralysis, loss of coordination, or loss of consciousness  Medication Review  Testosterone, amLODipine, anastrozole, aspirin EC, atorvastatin, calcium acetate, clindamycin, cloNIDine, clobetasol, fluticasone, gabapentin, metoprolol succinate, omeprazole, tamsulosin, topiramate, and traZODone  History Review  Allergy: [name] is allergic to accupril [quinapril hcl] and ace inhibitors. Drug: [name]  reports current drug use. Drug: Marijuana. Alcohol:  reports current alcohol use. Tobacco:  reports that he has been smoking cigarettes. He has never used  smokeless tobacco.  Social: [name]  reports that he has been smoking cigarettes. He has never used smokeless tobacco. He reports current alcohol use. He reports current drug use. Drug: Marijuana. Medical:  has a past medical history of Arthritis, Asthma, BPH (benign prostatic hyperplasia), Diabetes mellitus without complication (HCC), Dialysis patient (HCC), GERD (gastroesophageal reflux disease), Gout, Hyperlipidemia, Hypertension, Neuropathy, Renal disorder, Restless legs, and Sleep apnea. Surgical: [name]  has a past surgical history that includes Ankle surgery (Right); AV fistula placement (Left, 08/04/2016); IR ANGIO INTRA EXTRACRAN SEL INTERNAL CAROTID UNI R MOD SED (12/17/2016); Radiology with anesthesia (N/A, 12/17/2016); DIALYSIS/PERMA CATHETER INSERTION (N/A, 06/06/2017); A/V Fistulagram (N/A, 06/08/2017); AV Fistula Insertion w/RF Magnetic Guidance (Left, 09/02/2017); Upper Extremity Angiography (Left, 10/19/2017); Revison of arteriovenous fistula (Left, 01/04/2018); and DIALYSIS/PERMA CATHETER REMOVAL (N/A, 05/04/2018). Family: family history includes AAA (abdominal aortic aneurysm) in his mother; Diabetes in his mother; Heart disease in his father.  Laboratory Chemistry Profile   Renal Lab Results  Component Value Date   BUN 81 (H) 04/15/2023   CREATININE 18.34 (H) 04/15/2023   BCR 4 (L) 11/02/2022   GFRAA 5 (L) 12/08/2019   GFRNONAA 3 (L) 04/15/2023    Hepatic Lab Results  Component Value Date   AST 14 (L) 04/15/2023   ALT 12 04/15/2023   ALBUMIN 3.6 04/15/2023   ALKPHOS 44 04/15/2023   HCVAB <0.1 06/08/2017    Electrolytes Lab Results  Component Value Date   NA 141 04/15/2023   K 4.4 04/15/2023   CL 99 04/15/2023   CALCIUM 9.0 04/15/2023   MG 2.2 02/07/2023   PHOS 5.3 (H) 06/07/2017    Bone No results found for: "VD25OH", "VD125OH2TOT", "AT5573UK0", "UR4270WC3", "25OHVITD1", "25OHVITD2", "25OHVITD3", "TESTOFREE", "TESTOSTERONE"  Inflammation (CRP: Acute Phase) (ESR:  Chronic Phase) Lab Results  Component Value Date   LATICACIDVEN 0.7 07/09/2016         Note: Above Lab results reviewed.  Recent Imaging Review  DG Chest 2 View CLINICAL DATA:  Shortness of breath.  Last dialysis 4 days ago.  EXAM: CHEST - 2 VIEW  COMPARISON:  Radiographs 02/07/2023 and 12/08/2019.  FINDINGS: Lordotic positioning on the AP view. Both views are limited by body habitus. The heart size and mediastinal contours are stable. No evidence of edema, confluent airspace opacity, pneumothorax or significant pleural effusion. The bones appear unchanged.  IMPRESSION: No evidence of acute cardiopulmonary process.  Electronically Signed   By: Carey Bullocks M.D.   On: 04/15/2023 18:21 Note: Reviewed        Physical Exam  General appearance: Well nourished, well developed, and well hydrated. In no apparent acute distress Mental status: Alert, oriented x 3 (person, place, & time)       Respiratory: No evidence of acute respiratory distress Eyes: PERLA Vitals: There were no vitals taken for this visit. BMI: Estimated body mass index is 46.94 kg/m as calculated from the following:   Height as of 04/15/23: 5\' 10"  (1.778 m).   Weight as of 04/16/23: 327 lb 2.6 oz (148.4 kg). Ideal: Patient weight not recorded  Assessment   Diagnosis Status  1. Chronic low back pain (Primary Area of Pain) (Bilateral) (L>R) w/o sciatica   2. Chronic lower extremity pain (Bilateral) (L>R)   3. Lumbar facet joint pain   4. Lumbar facet hypertrophy (Multilevel) (Bilateral)   5. Lumbar facet syndrome (Bilateral) (L>R)   6. Osteoarthritis of facet joint of lumbar spine   7. ESRD on dialysis (HCC)   8. Chronic anticoagulation (Plavix)  Controlled Controlled Controlled   Updated Problems: Problem  Lumbar Facet Joint Pain  Morbid Obesity With Bmi of 45.0-49.9, Adult (Hcc)  Esrd On Hemodialysis (Hcc)  Esrd On Dialysis (Hcc)  Osa (Obstructive Sleep Apnea)  Chronic Obstructive Lung  Disease (Hcc)  Hyperlipidemia  Fixation Hardware in Leg  Chronic Pain of Right Ankle    Plan of Care  Problem-specific:  Assessment and Plan            Mr. DANTON PALMATEER has a current medication list which includes the following long-term medication(s): atorvastatin, fluticasone, gabapentin, metoprolol succinate, and omeprazole.  Pharmacotherapy (Medications Ordered): No orders of the defined types were placed in this encounter.  Orders:  No orders of the defined types were placed in this encounter.  Follow-up plan:   No follow-ups on file.      Interventional Therapies  Risk Factors  Considerations  Medical Comorbidities:  PLAVIX ANTICOAGULATION (Stop: 7-10 days  Re-start: 2 hrs)  MO (327 lbs) (BMI>35)     Planned  Pending:      Under consideration:   Diagnostic bilateral SI joint injection  Diagnostic bilateral IA hip joint injections  Palliative bilateral knee joint injections  Note: Eventually when the patient brings his BMI below 35, we may be able to offer him radiofrequency ablation of the lumbar facets.  (No RFA until BMI<35)   Completed:   Diagnostic/therapeutic bilateral lumbar facet (L2-S1) MBB x3 (06/21/2019) (75/75/100/100)    Therapeutic  Palliative (PRN) options:   Diagnostic/palliative bilateral lumbar facet block #4    Completed by other providers:   Prior patient of Merri Ray, DO     Recent Visits No visits were found meeting these conditions. Showing recent visits within past 90 days and meeting all other requirements Future Appointments Date Type Provider Dept  06/08/23 Appointment Delano Metz, MD [facility]  Showing future appointments within next 90 days and meeting all other requirements  I discussed the assessment and treatment plan with the patient. The patient was provided an opportunity to ask questions and all were answered. The patient agreed with the plan and demonstrated an understanding of the  instructions.  Patient advised to call back or seek an in-person evaluation if the symptoms or condition worsens.  Duration of encounter: *** minutes.  Total time on encounter, as per AMA guidelines included both the face-to-face and non-face-to-face time personally spent by the physician and/or other qualified health care professional(s) on the day of the encounter (includes time in activities that require the physician or other qualified health care professional and does not include time in activities normally performed by clinical staff). Physician's time may include the following activities when performed: Preparing to see the patient (e.g., pre-charting review of records, searching for previously ordered imaging, lab work, and nerve conduction tests) Review of prior analgesic pharmacotherapies. Reviewing PMP Interpreting ordered tests (e.g., lab work, imaging, nerve conduction tests) Performing post-procedure evaluations, including interpretation of diagnostic procedures Obtaining and/or reviewing separately obtained history Performing a medically appropriate examination and/or evaluation Counseling and educating the patient/family/caregiver Ordering medications, tests, or procedures Referring and communicating with other health care professionals (when not separately reported) Documenting clinical information in the electronic or other health record Independently interpreting results (not separately reported) and communicating results to the patient/ family/caregiver Care coordination (not separately reported)  Note by: Oswaldo Done, MD Date: 06/08/2023; Time: 7:43 AM

## 2023-06-08 ENCOUNTER — Encounter: Payer: Self-pay | Admitting: Pain Medicine

## 2023-06-08 ENCOUNTER — Ambulatory Visit: Payer: 59 | Attending: Pain Medicine | Admitting: Pain Medicine

## 2023-06-08 VITALS — BP 119/94 | HR 81 | Temp 97.2°F | Resp 16 | Ht 69.5 in | Wt 321.9 lb

## 2023-06-08 DIAGNOSIS — Z992 Dependence on renal dialysis: Secondary | ICD-10-CM | POA: Diagnosis present

## 2023-06-08 DIAGNOSIS — G8929 Other chronic pain: Secondary | ICD-10-CM

## 2023-06-08 DIAGNOSIS — M79604 Pain in right leg: Secondary | ICD-10-CM | POA: Diagnosis present

## 2023-06-08 DIAGNOSIS — M47817 Spondylosis without myelopathy or radiculopathy, lumbosacral region: Secondary | ICD-10-CM

## 2023-06-08 DIAGNOSIS — M79605 Pain in left leg: Secondary | ICD-10-CM | POA: Diagnosis present

## 2023-06-08 DIAGNOSIS — Z7901 Long term (current) use of anticoagulants: Secondary | ICD-10-CM | POA: Diagnosis present

## 2023-06-08 DIAGNOSIS — M47816 Spondylosis without myelopathy or radiculopathy, lumbar region: Secondary | ICD-10-CM

## 2023-06-08 DIAGNOSIS — M545 Low back pain, unspecified: Secondary | ICD-10-CM | POA: Diagnosis not present

## 2023-06-08 DIAGNOSIS — N186 End stage renal disease: Secondary | ICD-10-CM

## 2023-06-08 DIAGNOSIS — M5459 Other low back pain: Secondary | ICD-10-CM

## 2023-06-08 NOTE — Progress Notes (Deleted)
 Patient: Donald Fernandez  Service Category: E/M  Provider: Oswaldo Done, MD  DOB: [birth year]  DOS: 06/08/2023  Referring Provider: Hillery Aldo, MD  MRN: [id number]  Setting: Ambulatory outpatient  PCP: Hillery Aldo, MD  Type: New Patient  Specialty: Interventional Pain Management    Location: Office  Delivery: Face-to-face     Primary Reason(s) for Visit: Encounter for initial evaluation of one or more chronic problems (new to examiner) potentially causing chronic pain, and posing a threat to normal musculoskeletal function. (Level of risk: High) CC: Back Pain (Mid to lower bilateral ) and Hip Pain (R>L)  HPI  [name] is a 53 y.o. year old, male patient, who comes for the first time to our practice referred by Hillery Aldo, MD for our initial evaluation of his chronic pain. He has Acute on chronic renal failure (HCC); Essential hypertension; Type 2 DM with hypertension and ESRD on dialysis [facility]); Pseudotumor cerebri; Angioedema; ESRD on dialysis (HCC); Complication of vascular access for dialysis; Occlusion and stenosis of basilar artery; Anemia; ESRD on hemodialysis (HCC); Gastroesophageal reflux disease without esophagitis; OSA (obstructive sleep apnea); Papilledema; Personal history of gout; Severe uncontrolled hypertension; Small vessel disease, cerebrovascular; Lumbar facet hypertrophy (Multilevel) (Bilateral); Lumbar foraminal stenosis (Multilevel) (Bilateral); DDD (degenerative disc disease), lumbosacral; Sacroiliitis (Bilateral); Osteoarthritis of lumbar spine; Osteoarthritis of facet joint of lumbar spine; Chronic pain syndrome; Pharmacologic therapy; Disorder of skeletal system; Problems influencing health status; Chronic gout 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 tophus; Chronic kidney disease (CKD) Stage 5, on chronic dialysis (HCC); Chronic low back pain (1ry area of Pain) (Bilateral) (L>R) w/o sciatica; Lumbar facet syndrome (Bilateral) (L>R); Chronic lower extremity pain (Bilateral) (L>R);  Spondylosis without myelopathy or radiculopathy, lumbosacral region; Chronic anticoagulation (Plavix); Morbid obesity with BMI of 45.0-49.9, adult (HCC); Ventricular tachycardia (paroxysmal) (HCC); CAD (coronary artery disease); Nicotine dependence; Lumbar facet joint pain; Chronic obstructive lung disease (HCC); Fixation hardware in leg; Hyperlipidemia; Chronic pain of right ankle; and Chronic low back pain radiating to both legs on their problem list. Today he comes in for evaluation of his Back Pain (Mid to lower bilateral ) and Hip Pain (R>L)  Pain Assessment: Location: Mid, Lower, Right, Left Back Radiating: radiates down to right hip Onset: More than a month ago Duration: Chronic pain Quality: Sharp, Constant, Aching, Throbbing Severity: 6 /10 (subjective, self-reported pain score)  Effect on ADL: Limtis ADLs and difficult to sleep Timing: Constant Modifying factors: Heating pad BP: (!) 119/94  HR: 81  Onset and Duration: Present longer than 3 months Cause of pain: Unknown Severity: Getting worse, NAS-11 at its worse: 10/10, NAS-11 at its best: 2/10, NAS-11 now: 6/10, and NAS-11 on the average: 5/10 Timing: Not influenced by the time of the day, During activity or exercise, and After activity or exercise Aggravating Factors: Bending, Climbing, Motion, Prolonged sitting, Prolonged standing, Walking, Walking uphill, and Working Alleviating Factors: Hot packs, Resting, and Sitting Associated Problems: Pain that does not allow patient to sleep Quality of Pain: Aching, Constant, Sharp, and Throbbing Previous Examinations or Tests: X-rays Previous Treatments: Facet blocks  Donald Fernandez is being evaluated for possible interventional pain management therapies for the treatment of his chronic pain.  Discussed the use of AI scribe software for clinical note transcription with the patient, who gave verbal consent to proceed.  History of Present Illness   Donald Fernandez is a 53 year old male  who presents with lower back pain.  He has been experiencing lower back pain since his last visit.  The pain is primarily located in the lower back, affecting both sides, with the right side being more severe. It radiates to the buttocks on both sides but does not extend down the legs. The pain is particularly noticeable while lying in bed.  No numbness or weakness in the legs. He denies taking any blood thinners.  He recalls significant relief from a previous bilateral lumbar facet block, which lasted three to four years.      Donald Fernandez has been informed that this initial visit was an evaluation only.  On the follow up appointment I will go over the results, including ordered tests and available interventional therapies. At that time he will have the opportunity to decide whether to proceed with offered therapies or not. In the event that Donald Fernandez prefers avoiding interventional options, this will conclude our involvement in the case.  Medication management recommendations may be provided upon request.  Patient informed that diagnostic tests may be ordered to assist in identifying underlying causes, narrow the list of differential diagnoses and aid in determining candidacy for (or contraindications to) planned therapeutic interventions.  Historic Controlled Substance Pharmacotherapy Review  PMP and historical list of controlled substances: ***  Most recently prescribed opioid analgesics:   *** MME/day: *** mg/day  Historical Monitoring: The patient  reports current drug use. Drug: Marijuana. List of prior UDS Testing: Lab Results  Component Value Date   MDMA NONE DETECTED 01/04/2018   MDMA NONE DETECTED 09/02/2017   MDMA NONE DETECTED 08/17/2017   COCAINSCRNUR NONE DETECTED 01/04/2018   COCAINSCRNUR NONE DETECTED 09/02/2017   COCAINSCRNUR POSITIVE (A) 08/17/2017   PCPSCRNUR NONE DETECTED 01/04/2018   PCPSCRNUR NONE DETECTED 09/02/2017   PCPSCRNUR NONE DETECTED 08/17/2017   THCU NONE  DETECTED 01/04/2018   THCU NONE DETECTED 09/02/2017   THCU NONE DETECTED 08/17/2017   Historical Background Evaluation: [place] PMP: PDMP reviewed during this encounter. Review of the past 49-months conducted.             PMP NARX Score Report:  Narcotic: *** Sedative: *** Stimulant: *** [place] Department of public safety, offender search: Engineer, mining Information) Non-contributory Risk Assessment Profile: Aberrant behavior: None observed or detected today Risk factors for fatal opioid overdose: None identified today PMP NARX Overdose Risk Score: *** Fatal overdose hazard ratio (HR): Calculation deferred Non-fatal overdose hazard ratio (HR): Calculation deferred Risk of opioid abuse or dependence: 0.7-3.0% with doses <= 36 MME/day and 6.1-26% with doses >= 120 MME/day. Substance use disorder (SUD) risk level: See below Personal History of Substance Abuse (SUD-Substance use disorder):  Alcohol:    Illegal Drugs:    Rx Drugs:    ORT Risk Level calculation:    ORT Scoring interpretation table:  Score <3 = Low Risk for SUD  Score between 4-7 = Moderate Risk for SUD  Score >8 = High Risk for Opioid Abuse   PHQ-2 Depression Scale:  Total score: 1  PHQ-2 Scoring interpretation table: (Score and probability of major depressive disorder)  Score 0 = No depression  Score 1 = 15.4% Probability  Score 2 = 21.1% Probability  Score 3 = 38.4% Probability  Score 4 = 45.5% Probability  Score 5 = 56.4% Probability  Score 6 = 78.6% Probability   PHQ-9 Depression Scale:  Total score: 1  PHQ-9 Scoring interpretation table:  Score 0-4 = No depression  Score 5-9 = Mild depression  Score 10-14 = Moderate depression  Score 15-19 = Moderately severe depression  Score 20-27 = Severe depression (2.4 times  higher risk of SUD and 2.89 times higher risk of overuse)   Pharmacologic Plan: As per protocol, I have not taken over any controlled substance management, pending the results of ordered tests and/or  consults.            Initial impression: Pending review of available data and ordered tests.  Meds   Current Outpatient Medications:    amLODipine (NORVASC) 10 MG tablet, Take 10 mg by mouth daily., Disp: , Rfl:    ammonium lactate (AMLACTIN) 12 % cream, Apply topically., Disp: , Rfl:    anastrozole (ARIMIDEX) 1 MG tablet, TAKE ONE-HALF TABLET BY MOUTH ONCE DAILY, Disp: 45 tablet, Rfl: 0   aspirin EC 81 MG tablet, Take 81 mg by mouth daily., Disp: , Rfl:    calcium acetate (PHOSLO) 667 MG capsule, Take 2,668 mg by mouth 3 (three) times daily with meals., Disp: , Rfl: 0   clindamycin (CLINDAGEL) 1 % gel, APPLY TOPICALLY TO BUMPS ON SKIN TWO TIMES DAILY, Disp: 60 g, Rfl: 10   clobetasol (TEMOVATE) 0.05 % external solution, MIX ENTIRE BOTTLE INTO A JAR OF CERAVE CREAM & APPLY TOPICALLY TO AFFECTED AREAS 2X/DAY, AVOID FACE/GROIN/AXILLA; LONG-TERM USE CAN CAUSE THINNING OF SKIN, Disp: 50 mL, Rfl: 10   cloNIDine (CATAPRES) 0.1 MG tablet, Take by mouth., Disp: , Rfl:    fluticasone (FLONASE) 50 MCG/ACT nasal spray, Place 1 spray into both nostrils daily as needed for allergies or rhinitis., Disp: , Rfl:    gabapentin (NEURONTIN) 100 MG capsule, Take 1 capsule (100 mg total) by mouth 3 (three) times daily., Disp: 90 capsule, Rfl: 2   hydrOXYzine (ATARAX) 25 MG tablet, Take 25 mg by mouth 2 (two) times daily., Disp: , Rfl:    omeprazole (PRILOSEC) 40 MG capsule, Take 40 mg by mouth daily., Disp: , Rfl:    pantoprazole (PROTONIX) 40 MG tablet, Take by mouth., Disp: , Rfl:    tamsulosin (FLOMAX) 0.4 MG CAPS capsule, Take 0.8 mg by mouth daily after breakfast. , Disp: , Rfl:    Testosterone 20.25 MG/ACT (1.62%) GEL, Place 1 application onto the skin daily. (Apply to shoulders), Disp: , Rfl:    topiramate (TOPAMAX) 25 MG tablet, Take 50 mg by mouth daily., Disp: , Rfl:    traZODone (DESYREL) 50 MG tablet, Take 50 mg by mouth at bedtime., Disp: , Rfl:    metoprolol succinate (TOPROL-XL) 50 MG 24 hr tablet,  Take 1 tablet (50 mg total) by mouth daily. Take with or immediately following a meal., Disp: 30 tablet, Rfl: 0  Imaging Review  Cervical Imaging: Cervical MR wo contrast: Results for orders placed during the hospital encounter of 01/13/21  MR CERVICAL SPINE WO CONTRAST  Narrative CLINICAL DATA:  Neck pain. Numbness and tingling. Cervicalgia. Additional history provided by scanning technologist: Patient reports pain radiating down both arms to fingertips, numbness and tingling, symptoms for 3 months.  EXAM: MRI CERVICAL SPINE WITHOUT CONTRAST  TECHNIQUE: Multiplanar, multisequence MR imaging of the cervical spine was performed. No intravenous contrast was administered.  COMPARISON:  CT of the cervical spine 12/30/2011.  FINDINGS: Mild intermittent motion degradation, most notably affecting the axial T2 TSE sequence.  Alignment: Straightening of the expected cervical lordosis. No significant spondylolisthesis.  Vertebrae: Vertebral body height is maintained. No significant marrow edema or focal suspicious osseous lesion. Ventral osteophytes at C5-C6 and C6-C7.  Cord: Within the limitations of motion degradation, no spinal cord signal abnormality is identified.  Posterior Fossa, vertebral arteries, paraspinal tissues: No abnormality  identified within included portions of the posterior fossa. Flow voids preserved within the imaged cervical vertebral arteries. Paraspinal soft tissues unremarkable.  Disc levels:  Mild-to-moderate disc degeneration at C5-C6 and C6-C7. No more than mild disc degeneration at the remaining levels.  C2-C3: No significant disc herniation or stenosis.  C3-C4: Minimal uncovertebral hypertrophy on the right. No significant disc herniation or stenosis.  C4-C5: No significant disc herniation or stenosis.  C5-C6: Disc bulge with right greater than left uncovertebral hypertrophy. Minimal partial effacement of the ventral thecal sac (without  spinal cord mass effect). No significant foraminal stenosis.  C6-C7: Disc bulge. Mild bilateral uncovertebral hypertrophy. Minimal partial effacement of the ventral thecal sac (without spinal cord mass effect). No significant foraminal stenosis.  C7-T1: No significant disc herniation or stenosis.  IMPRESSION: Mildly motion degraded exam.  Cervical spondylosis, as outlined. At C5-C6 and C6-C7, shallow disc bulges minimally efface the ventral thecal sac (without spinal cord mass effect).  No significant spinal canal at the remaining levels. No significant foraminal stenosis within the cervical spine.  Straightening of the expected cervical lordosis.   Electronically Signed By: Jackey Loge D.O. On: 01/13/2021 16:09  Cervical MR wo contrast: No results found for this or any previous visit.  Cervical MR w/wo contrast: No results found for this or any previous visit.  Cervical MR w contrast: No results found for this or any previous visit.  Cervical CT wo contrast: No results found for this or any previous visit.  Cervical CT w/wo contrast: No results found for this or any previous visit.  Cervical CT w/wo contrast: No results found for this or any previous visit.  Cervical CT w contrast: No results found for this or any previous visit.  Cervical CT outside: No results found for this or any previous visit.  Cervical DG 1 view: No results found for this or any previous visit.  Cervical DG 2-3 views: No results found for this or any previous visit.  Cervical DG F/E views: No results found for this or any previous visit.  Cervical DG 2-3 clearing views: No results found for this or any previous visit.  Cervical DG Bending/F/E views: No results found for this or any previous visit.  Cervical DG complete: No results found for this or any previous visit.  Cervical DG Myelogram views: No results found for this or any previous visit.  Cervical DG Myelogram views: No results  found for this or any previous visit.  Cervical Discogram views: No results found for this or any previous visit.   Shoulder Imaging: Shoulder-R MR w contrast: No results found for this or any previous visit.  Shoulder-L MR w contrast: No results found for this or any previous visit.  Shoulder-R MR w/wo contrast: No results found for this or any previous visit.  Shoulder-L MR w/wo contrast: No results found for this or any previous visit.  Shoulder-R MR wo contrast: No results found for this or any previous visit.  Shoulder-L MR wo contrast: No results found for this or any previous visit.  Shoulder-R CT w contrast: No results found for this or any previous visit.  Shoulder-L CT w contrast: No results found for this or any previous visit.  Shoulder-R CT w/wo contrast: No results found for this or any previous visit.  Shoulder-L CT w/wo contrast: No results found for this or any previous visit.  Shoulder-R CT wo contrast: No results found for this or any previous visit.  Shoulder-L CT wo contrast: No results  found for this or any previous visit.  Shoulder-R DG Arthrogram: No results found for this or any previous visit.  Shoulder-L DG Arthrogram: No results found for this or any previous visit.  Shoulder-R DG 1 view: No results found for this or any previous visit.  Shoulder-L DG 1 view: No results found for this or any previous visit.  Shoulder-R DG: No results found for this or any previous visit.  Shoulder-L DG: No results found for this or any previous visit.   Thoracic Imaging: Thoracic MR wo contrast: No results found for this or any previous visit.  Thoracic MR wo contrast: No results found for this or any previous visit.  Thoracic MR w/wo contrast: No results found for this or any previous visit.  Thoracic MR w contrast: No results found for this or any previous visit.  Thoracic CT wo contrast: No results found for this or any previous visit.  Thoracic CT  w/wo contrast: No results found for this or any previous visit.  Thoracic CT w/wo contrast: No results found for this or any previous visit.  Thoracic CT w contrast: No results found for this or any previous visit.  Thoracic DG 2-3 views: No results found for this or any previous visit.  Thoracic DG 4 views: No results found for this or any previous visit.  Thoracic DG: No results found for this or any previous visit.  Thoracic DG w/swimmers view: No results found for this or any previous visit.  Thoracic DG Myelogram views: No results found for this or any previous visit.  Thoracic DG Myelogram views: No results found for this or any previous visit.   Lumbosacral Imaging: Lumbar MR wo contrast: Results for orders placed during the hospital encounter of 03/06/19  MR LUMBAR SPINE WO CONTRAST  Narrative CLINICAL DATA:  53 year old male with 2 years of low back pain radiating to the bilateral hips and legs. No known injury.  EXAM: MRI LUMBAR SPINE WITHOUT CONTRAST  TECHNIQUE: Multiplanar, multisequence MR imaging of the lumbar spine was performed. No intravenous contrast was administered.  COMPARISON:  Lumbar radiographs 02/05/2019.  FINDINGS: Segmentation:  Normal on the comparison radiographs.  Alignment: Preserved lumbar lordosis. Subtle retrolisthesis of L5 on S1.  Vertebrae: Degenerative superior endplate Schmorl's nodes associated with mild marrow edema at the L3, L4 and L5 endplates (series 7 image 9). Similar mild inferior endplate Schmorl's node edema at T12.  Normal background bone marrow signal. Intact visible sacrum and SI joints. No other acute osseous abnormality identified.  Conus medullaris and cauda equina: Conus extends to the L1-L2 level. No lower spinal cord or conus signal abnormality.  Paraspinal and other soft tissues: A degree of bilateral renal atrophy is suspected (series 8, image 11). Ectatic abdominal aorta up to 25 millimeters  diameter. Other visualized abdominal viscera and paraspinal soft tissues are within normal limits.  Disc levels:  T12-L1:  Mild disc space loss with minimal disc bulge. No stenosis.  L1-L2:  Negative.  L2-L3: Mild mostly far lateral disc bulging and endplate spurring. Borderline to mild posterior element hypertrophy. No stenosis.  L3-L4: Mild far lateral disc bulge and endplate spurring. Mild posterior element hypertrophy. Borderline to mild bilateral L3 foraminal stenosis.  L4-L5: Mild far lateral disc bulging and endplate spurring. Mild posterior element hypertrophy. Borderline to mild bilateral L4 foraminal stenosis.  L5-S1: Mild circumferential disc bulge and endplate spurring. Moderate facet and mild ligament flavum hypertrophy. Degenerative facet joint fluid greater on the right. Mild epidural lipomatosis. No spinal  or lateral recess stenosis. Mild to moderate left and mild right L5 foraminal stenosis.  IMPRESSION: 1. Degenerative vertebral edema associated with superior endplate Schmorl's nodes at T12, L3, L4 and L5. No other acute osseous abnormality. 2. Mild for age lumbar spine degeneration with no spinal or lateral recess stenosis. Up to mild multilevel neural foraminal stenosis related to disc bulging and posterior element hypertrophy. Moderate left L5 neural foraminal stenosis in part due to moderate facet arthropathy. 3. Ectatic abdominal aorta at risk for aneurysm development. Recommend followup by ultrasound in 5 years. This recommendation follows ACR consensus guidelines: White Paper of the ACR Incidental Findings Committee II on Vascular Findings. J Am Coll Radiol 2013; 10:789-794. Aortic aneurysm NOS (ICD10-I71.9)   Electronically Signed By: Odessa Fleming M.D. On: 03/06/2019 10:29  Lumbar MR wo contrast: No results found for this or any previous visit.  Lumbar MR w/wo contrast: No results found for this or any previous visit.  Lumbar MR w/wo contrast:  No results found for this or any previous visit.  Lumbar MR w contrast: No results found for this or any previous visit.  Lumbar CT wo contrast: No results found for this or any previous visit.  Lumbar CT w/wo contrast: No results found for this or any previous visit.  Lumbar CT w/wo contrast: No results found for this or any previous visit.  Lumbar CT w contrast: No results found for this or any previous visit.  Lumbar DG 1V: No results found for this or any previous visit.  Lumbar DG 1V (Clearing): No results found for this or any previous visit.  Lumbar DG 2-3V (Clearing): No results found for this or any previous visit.  Lumbar DG 2-3 views: Results for orders placed during the hospital encounter of 02/05/19  DG Lumbar Spine 2-3 Views  Narrative CLINICAL DATA:  Low back pain, no injury, pain to buttocks bilaterally up to mid back, history end-stage renal disease on dialysis, diabetes mellitus, hypertension, GERD, gout, neuropathy  EXAM: LUMBAR SPINE - 2-3 VIEW  COMPARISON:  None  FINDINGS: 5 non-rib-bearing lumbar vertebra.  Vertebral body and disc space heights maintained.  Osseous mineralization grossly normal.  No fracture, subluxation, bone destruction or spondylolysis.  SI joints appear expanded, sclerotic and ill-defined consistent with sacroiliitis.  Aortic atherosclerosis.  IMPRESSION: No acute lumbar spine abnormalities.  BILATERAL sacroiliitis.   Electronically Signed By: Ulyses Southward M.D. On: 02/05/2019 18:32  Lumbar DG (Complete) 4+V: No results found for this or any previous visit.        Lumbar DG F/E views: No results found for this or any previous visit.        Lumbar DG Bending views: No results found for this or any previous visit.        Lumbar DG Myelogram views: No results found for this or any previous visit.  Lumbar DG Myelogram: No results found for this or any previous visit.  Lumbar DG Myelogram: No results found for this  or any previous visit.  Lumbar DG Myelogram: No results found for this or any previous visit.  Lumbar DG Myelogram Lumbosacral: No results found for this or any previous visit.  Lumbar DG Diskogram views: No results found for this or any previous visit.  Lumbar DG Diskogram views: No results found for this or any previous visit.  Lumbar DG Epidurogram OP: No results found for this or any previous visit.  Lumbar DG Epidurogram IP: No results found for this or any previous visit.  Sacroiliac Joint Imaging: Sacroiliac Joint DG: No results found for this or any previous visit.  Sacroiliac Joint MR w/wo contrast: No results found for this or any previous visit.  Sacroiliac Joint MR wo contrast: No results found for this or any previous visit.   Spine Imaging: Whole Spine DG Myelogram views: No results found for this or any previous visit.  Whole Spine MR Mets screen: No results found for this or any previous visit.  Whole Spine MR Mets screen: No results found for this or any previous visit.  Whole Spine MR w/wo: No results found for this or any previous visit.  MRA Spinal Canal w/ cm: No results found for this or any previous visit.  MRA Spinal Canal wo/ cm: No results found for this or any previous visit.  MRA Spinal Canal w/wo cm: No results found for this or any previous visit.  Spine Outside MR Films: No results found for this or any previous visit.  Spine Outside CT Films: No results found for this or any previous visit.  CT-Guided Biopsy: No results found for this or any previous visit.  CT-Guided Needle Placement: No results found for this or any previous visit.  DG Spine outside: No results found for this or any previous visit.  IR Spine outside: No results found for this or any previous visit.  NM Spine outside: No results found for this or any previous visit.   Hip Imaging: Hip-R MR w contrast: No results found for this or any previous visit.  Hip-L MR w  contrast: No results found for this or any previous visit.  Hip-R MR w/wo contrast: No results found for this or any previous visit.  Hip-L MR w/wo contrast: No results found for this or any previous visit.  Hip-R MR wo contrast: No results found for this or any previous visit.  Hip-L MR wo contrast: No results found for this or any previous visit.  Hip-R CT w contrast: No results found for this or any previous visit.  Hip-L CT w contrast: No results found for this or any previous visit.  Hip-R CT w/wo contrast: No results found for this or any previous visit.  Hip-L CT w/wo contrast: No results found for this or any previous visit.  Hip-R CT wo contrast: No results found for this or any previous visit.  Hip-L CT wo contrast: No results found for this or any previous visit.  Hip-R DG 2-3 views: No results found for this or any previous visit.  Hip-L DG 2-3 views: No results found for this or any previous visit.  Hip-R DG Arthrogram: No results found for this or any previous visit.  Hip-L DG Arthrogram: No results found for this or any previous visit.  Hip-B DG Bilateral: No results found for this or any previous visit.  Hip-B DG Bilateral (5V): No results found for this or any previous visit.   Knee Imaging: Knee-R MR w contrast: No results found for this or any previous visit.  Knee-L MR w/o contrast: No results found for this or any previous visit.  Knee-R MR w/wo contrast: No results found for this or any previous visit.  Knee-L MR w/wo contrast: No results found for this or any previous visit.  Knee-R MR wo contrast: No results found for this or any previous visit.  Knee-L MR wo contrast: No results found for this or any previous visit.  Knee-R CT w contrast: No results found for this or any previous visit.  Knee-L  CT w contrast: No results found for this or any previous visit.  Knee-R CT w/wo contrast: No results found for this or any previous visit.  Knee-L  CT w/wo contrast: No results found for this or any previous visit.  Knee-R CT wo contrast: No results found for this or any previous visit.  Knee-L CT wo contrast: No results found for this or any previous visit.  Knee-R DG 1-2 views: No results found for this or any previous visit.  Knee-L DG 1-2 views: No results found for this or any previous visit.  Knee-R DG 3 views: No results found for this or any previous visit.  Knee-L DG 3 views: No results found for this or any previous visit.  Knee-R DG 4 views: No results found for this or any previous visit.  Knee-L DG 4 views: No results found for this or any previous visit.  Knee-R DG Arthrogram: No results found for this or any previous visit.  Knee-L DG Arthrogram: No results found for this or any previous visit.   Ankle Imaging: Ankle-R DG Complete: No results found for this or any previous visit.  Ankle-L DG Complete: No results found for this or any previous visit.   Foot Imaging: Foot-R DG Complete: No results found for this or any previous visit.  Foot-L DG Complete: No results found for this or any previous visit.   Elbow Imaging: Elbow-R DG Complete: No results found for this or any previous visit.  Elbow-L DG Complete: No results found for this or any previous visit.   Wrist Imaging: Wrist-R DG Complete: No results found for this or any previous visit.  Wrist-L DG Complete: No results found for this or any previous visit.   Hand Imaging: Hand-R DG Complete: No results found for this or any previous visit.  Hand-L DG Complete: No results found for this or any previous visit.   Complexity Note: Imaging results reviewed.                         ROS  Cardiovascular: High blood pressure Pulmonary or Respiratory: No reported pulmonary signs or symptoms such as wheezing and difficulty taking a deep full breath (Asthma), difficulty blowing air out (Emphysema), coughing up mucus (Bronchitis), persistent dry  cough, or temporary stoppage of breathing during sleep Neurological: No reported neurological signs or symptoms such as seizures, abnormal skin sensations, urinary and/or fecal incontinence, being born with an abnormal open spine and/or a tethered spinal cord Psychological-Psychiatric: No reported psychological or psychiatric signs or symptoms such as difficulty sleeping, anxiety, depression, delusions or hallucinations (schizophrenial), mood swings (bipolar disorders) or suicidal ideations or attempts Gastrointestinal: Reflux or heatburn Genitourinary: Kidney disease, Difficulty producing urine (Renal failure), and Dialysis Hematological: No reported hematological signs or symptoms such as prolonged bleeding, low or poor functioning platelets, bruising or bleeding easily, hereditary bleeding problems, low energy levels due to low hemoglobin or being anemic Endocrine: High blood sugar controlled without the use of insulin (NIDDM) Rheumatologic: No reported rheumatological signs and symptoms such as fatigue, joint pain, tenderness, swelling, redness, heat, stiffness, decreased range of motion, with or without associated rash Musculoskeletal: Negative for myasthenia gravis, muscular dystrophy, multiple sclerosis or malignant hyperthermia Work History: Disabled  Allergies  Donald Fernandez is allergic to accupril [quinapril hcl] and ace inhibitors.  Laboratory Chemistry Profile   Renal Lab Results  Component Value Date   BUN 81 (H) 04/15/2023   CREATININE 18.34 (H) 04/15/2023   BCR 4 (  L) 11/02/2022   GFRAA 5 (L) 12/08/2019   GFRNONAA 3 (L) 04/15/2023   PROTEINUR 100 (A) 07/09/2016     Electrolytes Lab Results  Component Value Date   NA 141 04/15/2023   K 4.4 04/15/2023   CL 99 04/15/2023   CALCIUM 9.0 04/15/2023   MG 2.2 02/07/2023   PHOS 5.3 (H) 06/07/2017     Hepatic Lab Results  Component Value Date   AST 14 (L) 04/15/2023   ALT 12 04/15/2023   ALBUMIN 3.6 04/15/2023   ALKPHOS 44  04/15/2023     ID Lab Results  Component Value Date   HIV Non Reactive 11/14/2020   SARSCOV2NAA NEGATIVE 12/23/2020   STAPHAUREUS NEGATIVE 12/17/2016   MRSAPCR POSITIVE (A) 06/06/2017   HCVAB <0.1 06/08/2017     Bone No results found for: "VD25OH", "VD125OH2TOT", "VD3125OH2", "VD2125OH2", "25OHVITD1", "25OHVITD2", "25OHVITD3", "TESTOFREE", "TESTOSTERONE"   Endocrine Lab Results  Component Value Date   GLUCOSE 110 (H) 04/15/2023   GLUCOSEU NEGATIVE 07/09/2016   HGBA1C 5.9 (H) 11/14/2020   TSH 2.420 11/02/2022     Neuropathy Lab Results  Component Value Date   HGBA1C 5.9 (H) 11/14/2020   HIV Non Reactive 11/14/2020     CNS Lab Results  Component Value Date   COLORCSF COLORLESS 08/30/2016   APPEARCSF CLEAR (A) 08/30/2016   RBCCOUNTCSF 6 (H) 08/30/2016   WBCCSF 0 08/30/2016   POLYSCSF 0 08/30/2016   LYMPHSCSF 0 08/30/2016   EOSCSF 0 08/30/2016   PROTEINCSF 27 08/30/2016   GLUCCSF 70 08/30/2016     Inflammation (CRP: Acute  ESR: Chronic) Lab Results  Component Value Date   LATICACIDVEN 0.7 07/09/2016     Rheumatology No results found for: "RF", "ANA", "LABURIC", "URICUR", "LYMEIGGIGMAB", "LYMEABIGMQN", "HLAB27"   Coagulation Lab Results  Component Value Date   INR 0.88 12/14/2017   LABPROT 11.9 12/14/2017   APTT 29 12/14/2017   PLT 221 04/15/2023     Cardiovascular Lab Results  Component Value Date   BNP 424.6 (H) 04/15/2023   CKTOTAL 415 (H) 12/31/2011   CKMB 2.9 12/31/2011   TROPONINI 0.06 (H) 12/31/2011   HGB 10.6 (L) 04/15/2023   HCT 32.5 (L) 04/15/2023     Screening Lab Results  Component Value Date   SARSCOV2NAA NEGATIVE 12/23/2020   STAPHAUREUS NEGATIVE 12/17/2016   MRSAPCR POSITIVE (A) 06/06/2017   HCVAB <0.1 06/08/2017   HIV Non Reactive 11/14/2020     Cancer No results found for: "CEA", "CA125", "LABCA2"   Allergens No results found for: "ALMOND", "APPLE", "ASPARAGUS", "AVOCADO", "BANANA", "BARLEY", "BASIL", "BAYLEAF",  "GREENBEAN", "LIMABEAN", "WHITEBEAN", "BEEFIGE", "REDBEET", "BLUEBERRY", "BROCCOLI", "CABBAGE", "MELON", "CARROT", "CASEIN", "CASHEWNUT", "CAULIFLOWER", "CELERY"     Note: Lab results reviewed.  PFSH  Drug: Donald Fernandez  reports current drug use. Drug: Marijuana. Alcohol:  reports current alcohol use. Tobacco:  reports that he has been smoking cigarettes. He has never used smokeless tobacco. Medical:  has a past medical history of Arthritis, Asthma, BPH (benign prostatic hyperplasia), Diabetes mellitus without complication (HCC), Dialysis patient (HCC), GERD (gastroesophageal reflux disease), Gout, Hyperlipidemia, Hypertension, Neuropathy, Renal disorder, Restless legs, and Sleep apnea. Family: family history includes AAA (abdominal aortic aneurysm) in his mother; Diabetes in his mother; Heart disease in his father.  Past Surgical History:  Procedure Laterality Date   A/V FISTULAGRAM N/A 06/08/2017   Procedure: A/V FISTULAGRAM;  Surgeon: Annice Needy, MD;  Location: [facility];  Service: Cardiovascular;  Laterality: N/A;   ANKLE SURGERY Right    fracture  repair with pins, plates, screws   AV FISTULA INSERTION W/ RF MAGNETIC GUIDANCE Left 09/02/2017   Procedure: AV FISTULA INSERTION W/RF MAGNETIC GUIDANCE;  Surgeon: Renford Dills, MD;  Location: [facility];  Service: Cardiovascular;  Laterality: Left;   AV FISTULA PLACEMENT Left 08/04/2016   Procedure: ARTERIOVENOUS (AV) FISTULA CREATION ( RADIOCEPHALIC );  Surgeon: Renford Dills, MD;  Location: [facility];  Service: Vascular;  Laterality: Left;   DIALYSIS/PERMA CATHETER INSERTION N/A 06/06/2017   Procedure: DIALYSIS/PERMA CATHETER INSERTION;  Surgeon: Annice Needy, MD;  Location: [facility];  Service: Cardiovascular;  Laterality: N/A;   DIALYSIS/PERMA CATHETER REMOVAL N/A 05/04/2018   Procedure: DIALYSIS/PERMA CATHETER REMOVAL;  Surgeon: Annice Needy, MD;  Location: [facility];  Service: Cardiovascular;   Laterality: N/A;   IR ANGIO INTRA EXTRACRAN SEL INTERNAL CAROTID UNI R MOD SED  12/17/2016   RADIOLOGY WITH ANESTHESIA N/A 12/17/2016   Procedure: Arteriogram/Venogram with right venous sinus manometry;  Surgeon: Lisbeth Renshaw, MD;  Location: [facility];  Service: Radiology;  Laterality: N/A;   REVISON OF ARTERIOVENOUS FISTULA Left 01/04/2018   Procedure: REVISON OF ARTERIOVENOUS FISTULA ( BRACHIALCEPHALIC );  Surgeon: Renford Dills, MD;  Location: [facility];  Service: Vascular;  Laterality: Left;   UPPER EXTREMITY ANGIOGRAPHY Left 10/19/2017   Procedure: UPPER EXTREMITY ANGIOGRAPHY;  Surgeon: Renford Dills, MD;  Location: [facility];  Service: Cardiovascular;  Laterality: Left;   Active Ambulatory Problems    Diagnosis Date Noted   Acute on chronic renal failure (HCC) 07/09/2016   Essential hypertension 08/02/2016   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) 08/02/2016   Pseudotumor cerebri 12/17/2016   Angioedema 06/05/2017   ESRD on dialysis (HCC) 07/27/2017   Complication of vascular access for dialysis 10/31/2017   Occlusion and stenosis of basilar artery 08/24/2018   Anemia 03/15/2019   ESRD on hemodialysis (HCC) 06/07/2018   Gastroesophageal reflux disease without esophagitis 06/07/2018   OSA (obstructive sleep apnea) 11/27/2014   Papilledema 07/30/2016   Personal history of gout 06/07/2018   Severe uncontrolled hypertension 07/30/2016   Small vessel disease, cerebrovascular 06/07/2018   Lumbar facet hypertrophy (Multilevel) (Bilateral) 03/26/2019   Lumbar foraminal stenosis (Multilevel) (Bilateral) 03/26/2019   DDD (degenerative disc disease), lumbosacral 03/26/2019   Sacroiliitis (Bilateral) 03/26/2019   Osteoarthritis of lumbar spine 03/26/2019   Osteoarthritis of facet joint of lumbar spine 03/26/2019   Chronic pain syndrome 03/26/2019   Pharmacologic therapy 03/26/2019   Disorder of skeletal system 03/26/2019   Problems influencing health  status 03/26/2019   Chronic gout 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 tophus 03/26/2019   Chronic kidney disease (CKD) Stage 5, on chronic dialysis (HCC) 03/26/2019   Chronic low back pain (1ry area of Pain) (Bilateral) (L>R) w/o sciatica 03/26/2019   Lumbar facet syndrome (Bilateral) (L>R) 03/26/2019   Chronic lower extremity pain (Bilateral) (L>R) 03/26/2019   Spondylosis without myelopathy or radiculopathy, lumbosacral region 03/26/2019   Chronic anticoagulation (Plavix) 03/26/2019   Morbid obesity with BMI of 45.0-49.9, adult (HCC) 06/21/2019   Ventricular tachycardia (paroxysmal) (HCC) 11/14/2020   CAD (coronary artery disease) 11/14/2020   Nicotine dependence 11/14/2020   Lumbar facet joint pain 06/07/2023   Chronic obstructive lung disease (HCC) 06/07/2023   Fixation hardware in leg 12/28/2022   Hyperlipidemia 06/07/2023   Chronic pain of right ankle 12/28/2022   Chronic low back pain radiating to both legs 06/08/2023   Resolved Ambulatory Problems    Diagnosis  Date Noted   No Resolved Ambulatory Problems   Past Medical History:  Diagnosis Date   Arthritis    Asthma    BPH (benign prostatic hyperplasia)    Diabetes mellitus without complication (HCC)    Dialysis patient (HCC)    GERD (gastroesophageal reflux disease)    Gout    Hypertension    Neuropathy    Renal disorder    Restless legs    Sleep apnea    Constitutional Exam  General appearance: Well nourished, well developed, and well hydrated. In no apparent acute distress Vitals:   06/08/23 1209  BP: (!) 119/94  Pulse: 81  Resp: 16  Temp: (!) 97.2 F (36.2 C)  TempSrc: Temporal  SpO2: 98%  Weight: (!) 321 lb 14 oz (146 kg)  Height: 5' 9.5" (1.765 m)   BMI Assessment: Estimated body mass index is 46.85 kg/m as calculated from the following:   Height as of this encounter: 5' 9.5" (1.765 m).   Weight as of this encounter: 321 lb 14 oz (146 kg).  BMI interpretation table: BMI level Category Range  association with higher incidence of chronic pain  <18 kg/m2 Underweight   18.5-24.9 kg/m2 Ideal body weight   25-29.9 kg/m2 Overweight Increased incidence by 20%  30-34.9 kg/m2 Obese (Class I) Increased incidence by 68%  35-39.9 kg/m2 Severe obesity (Class II) Increased incidence by 136%  >40 kg/m2 Extreme obesity (Class III) Increased incidence by 254%   Patient's current BMI Ideal Body weight  Body mass index is 46.85 kg/m. Ideal body weight: 71.8 kg (158 lb 6.4 oz) Adjusted ideal body weight: 101.5 kg (223 lb 12.6 oz)   BMI Readings from Last 4 Encounters:  06/08/23 46.85 kg/m  04/16/23 46.94 kg/m  02/07/23 47.27 kg/m  09/20/21 44.30 kg/m   Wt Readings from Last 4 Encounters:  06/08/23 (!) 321 lb 14 oz (146 kg)  04/16/23 (!) 327 lb 2.6 oz (148.4 kg)  02/07/23 (!) 320 lb 1.7 oz (145.2 kg)  09/20/21 300 lb (136.1 kg)    Psych/Mental status: Alert, oriented x 3 (person, place, & time)       Eyes: PERLA Respiratory: No evidence of acute respiratory distress  Assessment  Primary Diagnosis & Pertinent Problem List: The primary encounter diagnosis was Chronic low back pain (1ry area of Pain) (Bilateral) (L>R) w/o sciatica. Diagnoses of Chronic low back pain radiating to both legs, Lumbar facet joint pain, Lumbar facet hypertrophy (Multilevel) (Bilateral), Lumbar facet syndrome (Bilateral) (L>R), Osteoarthritis of facet joint of lumbar spine, Spondylosis without myelopathy or radiculopathy, lumbosacral region, ESRD on dialysis (HCC), and Chronic anticoagulation (Plavix) were also pertinent to this visit.  Visit Diagnosis (New problems to examiner): 1. Chronic low back pain (1ry area of Pain) (Bilateral) (L>R) w/o sciatica   2. Chronic low back pain radiating to both legs   3. Lumbar facet joint pain   4. Lumbar facet hypertrophy (Multilevel) (Bilateral)   5. Lumbar facet syndrome (Bilateral) (L>R)   6. Osteoarthritis of facet joint of lumbar spine   7. Spondylosis without  myelopathy or radiculopathy, lumbosacral region   8. ESRD on dialysis (HCC)   9. Chronic anticoagulation (Plavix)    Plan of Care (Initial workup plan)  Note: Donald Fernandez was reminded that as per protocol, today's visit has been an evaluation only. We have not taken over the patient's controlled substance management.  Problem-specific plan: Assessment and Plan    Chronic Lower Back Pain with Referred Pain to Buttocks Chronic lower back pain  with bilateral referred pain to the buttocks is more severe on the right side. The pain is consistent with previous episodes that were effectively managed with bilateral lumbar facet blocks. There is no numbness or weakness in the legs, and he is not on anticoagulants. After discussing the risks, benefits, and alternatives of bilateral lumbar facet block with sedation, he prefers the same treatment, which previously provided relief for 3-4 years. Hospital regulations require a driver post-procedure. Order a bilateral lumbar facet block with sedation and ensure a driver is available. Schedule the procedure on a Tuesday or Thursday after insurance approval.  Dialysis Schedule Coordination Dialysis occurs on Monday and Wednesday. Coordinate the lumbar facet block procedure with this schedule, ensuring it aligns with the Tuesday or Thursday procedure days.       Lab Orders  No laboratory test(s) ordered today   Imaging Orders  No imaging studies ordered today   Referral Orders  No referral(s) requested today   Procedure Orders         LUMBAR FACET(MEDIAL BRANCH NERVE BLOCK) MBNB    Pharmacotherapy (current): Medications ordered:  No orders of the defined types were placed in this encounter.  Medications administered during this visit: Donald Fernandez had no medications administered during this visit.   Analgesic Pharmacotherapy:  Opioid Analgesics: For patients currently taking or requesting to take opioid analgesics, in accordance with [employer] Guidelines, we will assess their risks and indications for the use of these substances. After completing our evaluation, we may offer recommendations, but we no longer take patients for medication management. The prescribing physician will ultimately decide, based on his/her training and level of comfort whether to adopt any of the recommendations, including whether or not to prescribe such medicines.  Membrane stabilizer: To be determined at a later time  Muscle relaxant: To be determined at a later time  NSAID: To be determined at a later time  Other analgesic(s): To be determined at a later time   Interventional management options: Donald Fernandez was informed that there is no guarantee that he would be a candidate for interventional therapies. The decision will be based on the results of diagnostic studies, as well as Donald Fernandez's risk profile.  Procedure(s) under consideration:  Pending results of ordered studies      Interventional Therapies  Risk Factors  Considerations  Medical Comorbidities:     Planned  Pending:      Under consideration:   Pending   Completed:   None at this time   Therapeutic  Palliative (PRN) options:   None established   Completed by other providers:   None reported       Provider-requested follow-up: Return for (ECT): (B) L-FCT Blk #4 (1st of 2025).  Future Appointments  Date Time Provider Department Center  06/08/2023  1:40 PM Delano Metz, MD [facility] None  06/09/2023 11:00 AM Elie Goody, MD [facility] None  06/28/2023  8:00 AM Delano Metz, MD [facility] None    Duration of encounter: *** minutes.  Total time on encounter, as per AMA guidelines included both the face-to-face and non-face-to-face time personally spent by the physician and/or other qualified health care professional(s) on the day of the encounter (includes time in activities that require the physician or other qualified health care  professional and does not include time in activities normally performed by clinical staff). Physician's time may include the following activities when performed: Preparing to see the patient (e.g., pre-charting review of records, searching for previously  ordered imaging, lab work, and nerve conduction tests) Review of prior analgesic pharmacotherapies. Reviewing PMP Interpreting ordered tests (e.g., lab work, imaging, nerve conduction tests) Performing post-procedure evaluations, including interpretation of diagnostic procedures Obtaining and/or reviewing separately obtained history Performing a medically appropriate examination and/or evaluation Counseling and educating the patient/family/caregiver Ordering medications, tests, or procedures Referring and communicating with other health care professionals (when not separately reported) Documenting clinical information in the electronic or other health record Independently interpreting results (not separately reported) and communicating results to the patient/ family/caregiver Care coordination (not separately reported)  Note by: Oswaldo Done, MD (AI and TTS technology used. I apologize for any typographical errors that were not detected and corrected.) Date: 06/08/2023; Time: 1:06 PM

## 2023-06-08 NOTE — Progress Notes (Signed)
 Safety precautions to be maintained throughout the outpatient stay will include: orient to surroundings, keep bed in low position, maintain call bell within reach at all times, provide assistance with transfer out of bed and ambulation.

## 2023-06-08 NOTE — Patient Instructions (Addendum)
 DO NOT EAT OR DRINK FOR 6 HOURS PRIOR TO PROCEDURE   EXPLAINED THAT HE CAN'T USE PUBLIC TRANSPORTATION. STATES UNDERSTANDING MUST BRING A RESPONSIBLE DRIVER TO ACCEPT RESPONSIBILITY FOR HIM  TAKE BLOOD PRESSURE MEDICAITON WITH A SMALL SIP OF WATER THE MORNING OF THE PROCEDURE.  ______________________________________________________________________    Blood Thinners  IMPORTANT NOTICE:  If you take any of these, make sure to notify the nursing staff.  Failure to do so may result in serious injury.  Recommended time intervals to stop and restart blood-thinners, before & after invasive procedures  Generic Name Brand Name Pre-procedure: Stop medication for this amount of time before your procedure: Post-procedure: Wait this amount of time after the procedure before restarting your medication:  Abciximab Reopro 15 days 2 hrs  Alteplase Activase 10 days 10 days  Anagrelide Agrylin    Apixaban Eliquis 3 days 6 hrs  Cilostazol Pletal 3 days 5 hrs  Clopidogrel Plavix 7-10 days 2 hrs  Dabigatran Pradaxa 5 days 6 hrs  Dalteparin Fragmin 24 hours 4 hrs  Dipyridamole Aggrenox 11days 2 hrs  Edoxaban Lixiana; Savaysa 3 days 2 hrs  Enoxaparin  Lovenox 24 hours 4 hrs  Eptifibatide Integrillin 8 hours 2 hrs  Fondaparinux  Arixtra 72 hours 12 hrs  Hydroxychloroquine Plaquenil 11 days   Prasugrel Effient 7-10 days 6 hrs  Reteplase Retavase 10 days 10 days  Rivaroxaban Xarelto 3 days 6 hrs  Ticagrelor Brilinta 5-7 days 6 hrs  Ticlopidine Ticlid 10-14 days 2 hrs  Tinzaparin Innohep 24 hours 4 hrs  Tirofiban Aggrastat 8 hours 2 hrs  Warfarin Coumadin 5 days 2 hrs   Other medications with blood-thinning effects  Product indications Generic (Brand) names Note  Cholesterol Lipitor Stop 4 days before procedure  Blood thinner (injectable) Heparin (LMW or LMWH Heparin) Stop 24 hours before procedure  Cancer Ibrutinib (Imbruvica) Stop 7 days before procedure  Malaria/Rheumatoid Hydroxychloroquine  (Plaquenil) Stop 11 days before procedure  Thrombolytics  10 days before or after procedures   Over-the-counter (OTC) Products with blood-thinning effects  Product Common names Stop Time  Aspirin > 325 mg Goody Powders, Excedrin, etc. 11 days  Aspirin <= 81 mg  7 days  Fish oil  4 days  Garlic supplements  7 days  Ginkgo biloba  36 hours  Ginseng  24 hours  NSAIDs Ibuprofen, Naprosyn, etc. 3 days  Vitamin E  4 days   ______________________________________________________________________      ______________________________________________________________________    Procedure instructions  Stop blood-thinners  Do not eat or drink fluids (other than water) for 6 hours before your procedure  No water for 2 hours before your procedure  Take your blood pressure medicine with a sip of water  Arrive 30 minutes before your appointment  If sedation is planned, bring suitable driver. Pennie Banter, Benedetto Goad, & public transportation are NOT APPROVED)  Carefully read the "Preparing for your procedure" detailed instructions  If you have questions call us at [phone]  Procedure appointments are for procedures only. NO medication refills or new problem evaluations.   ______________________________________________________________________      ______________________________________________________________________    Preparing for your procedure  Appointments: If you think you may not be able to keep your appointment, call 24-48 hours in advance to cancel. We need time to make it available to others.  Procedure visits are for procedures only. During your procedure appointment there will be: NO Prescription Refills*. NO medication changes or discussions*. NO discussion of disability issues*. NO unrelated pain problem evaluations*. NO  evaluations to order other pain procedures*. *These will be addressed at a separate and distinct evaluation encounter on the provider's evaluation schedule  and not during procedure days.  Instructions: Food intake: Avoid eating anything solid for at least 8 hours prior to your procedure. Clear liquid intake: You may take clear liquids such as water up to 2 hours prior to your procedure. (No carbonated drinks. No soda.) Transportation: Unless otherwise stated by your physician, bring a driver. (Driver cannot be a Market researcher, Pharmacist, community, or any other form of public transportation.) Morning Medicines: Except for blood thinners, take all of your other morning medications with a sip of water. Make sure to take your heart and blood pressure medicines. If your blood pressure's lower number is above 100, the case will be rescheduled. Blood thinners: Make sure to stop your blood thinners as instructed.  If you take a blood thinner, but were not instructed to stop it, call our office [phone] and ask to talk to a nurse. Not stopping a blood thinner prior to certain procedures could lead to serious complications. Diabetics on insulin: Notify the staff so that you can be scheduled 1st case in the morning. If your diabetes requires high dose insulin, take only  of your normal insulin dose the morning of the procedure and notify the staff that you have done so. Preventing infections: Shower with an antibacterial soap the morning of your procedure.  Build-up your immune system: Take 1000 mg of Vitamin C with every meal (3 times a day) the day prior to your procedure. Antibiotics: Inform the nursing staff if you are taking any antibiotics or if you have any conditions that may require antibiotics prior to procedures. (Example: recent joint implants)   Pregnancy: If you are pregnant make sure to notify the nursing staff. Not doing so may result in injury to the fetus, including death.  Sickness: If you have a cold, fever, or any active infections, call and cancel or reschedule your procedure. Receiving steroids while having an infection may result in complications. Arrival:  You must be in the facility at least 30 minutes prior to your scheduled procedure. Tardiness: Your scheduled time is also the cutoff time. If you do not arrive at least 15 minutes prior to your procedure, you will be rescheduled.  Children: Do not bring any children with you. Make arrangements to keep them home. Dress appropriately: There is always a possibility that your clothing may get soiled. Avoid long dresses. Valuables: Do not bring any jewelry or valuables.  Reasons to call and reschedule or cancel your procedure: (Following these recommendations will minimize the risk of a serious complication.) Surgeries: Avoid having procedures within 2 weeks of any surgery. (Avoid for 2 weeks before or after any surgery). Flu Shots: Avoid having procedures within 2 weeks of a flu shots or . (Avoid for 2 weeks before or after immunizations). Barium: Avoid having a procedure within 7-10 days after having had a radiological study involving the use of radiological contrast. (Myelograms, Barium swallow or enema study). Heart attacks: Avoid any elective procedures or surgeries for the initial 6 months after a "Myocardial Infarction" (Heart Attack). Blood thinners: It is imperative that you stop these medications before procedures. Let us know if you if you take any blood thinner.  Infection: Avoid procedures during or within two weeks of an infection (including chest colds or gastrointestinal problems). Symptoms associated with infections include: Localized redness, fever, chills, night sweats or profuse sweating, burning sensation when voiding, cough, congestion,  stuffiness, runny nose, sore throat, diarrhea, nausea, vomiting, cold or Flu symptoms, recent or current infections. It is specially important if the infection is over the area that we intend to treat. Heart and lung problems: Symptoms that may suggest an active cardiopulmonary problem include: cough, chest pain, breathing difficulties or shortness of  breath, dizziness, ankle swelling, uncontrolled high or unusually low blood pressure, and/or palpitations. If you are experiencing any of these symptoms, cancel your procedure and contact your primary care physician for an evaluation.  Remember:  Regular Business hours are:  Monday to Thursday 8:00 AM to 4:00 PM  Provider's Schedule: Delano Metz, MD:  Procedure days: Tuesday and Thursday 7:30 AM to 4:00 PM  Edward Jolly, MD:  Procedure days: Monday and Wednesday 7:30 AM to 4:00 PM Last  Updated: 03/22/2023 ______________________________________________________________________      ______________________________________________________________________    General Risks and Possible Complications  Patient Responsibilities: It is important that you read this as it is part of your informed consent. It is our duty to inform you of the risks and possible complications associated with treatments offered to you. It is your responsibility as a patient to read this and to ask questions about anything that is not clear or that you believe was not covered in this document.  Patient's Rights: You have the right to refuse treatment. You also have the right to change your mind, even after initially having agreed to have the treatment done. However, under this last option, if you wait until the last second to change your mind, you may be charged for the materials used up to that point.  Introduction: Medicine is not an Visual merchandiser. Everything in Medicine, including the lack of treatment(s), carries the potential for danger, harm, or loss (which is by definition: Risk). In Medicine, a complication is a secondary problem, condition, or disease that can aggravate an already existing one. All treatments carry the risk of possible complications. The fact that a side effects or complications occurs, does not imply that the treatment was conducted incorrectly. It must be clearly understood that these can  happen even when everything is done following the highest safety standards.  No treatment: You can choose not to proceed with the proposed treatment alternative. The "PRO(s)" would include: avoiding the risk of complications associated with the therapy. The "CON(s)" would include: not getting any of the treatment benefits. These benefits fall under one of three categories: diagnostic; therapeutic; and/or palliative. Diagnostic benefits include: getting information which can ultimately lead to improvement of the disease or symptom(s). Therapeutic benefits are those associated with the successful treatment of the disease. Finally, palliative benefits are those related to the decrease of the primary symptoms, without necessarily curing the condition (example: decreasing the pain from a flare-up of a chronic condition, such as incurable terminal cancer).  General Risks and Complications: These are associated to most interventional treatments. They can occur alone, or in combination. They fall under one of the following six (6) categories: no benefit or worsening of symptoms; bleeding; infection; nerve damage; allergic reactions; and/or death. No benefits or worsening of symptoms: In Medicine there are no guarantees, only probabilities. No healthcare provider can ever guarantee that a medical treatment will work, they can only state the probability that it may. Furthermore, there is always the possibility that the condition may worsen, either directly, or indirectly, as a consequence of the treatment. Bleeding: This is more common if the patient is taking a blood thinner, either prescription or over the  counter (example: Goody Powders, Fish oil, Aspirin, Garlic, etc.), or if suffering a condition associated with impaired coagulation (example: Hemophilia, cirrhosis of the liver, low platelet counts, etc.). However, even if you do not have one on these, it can still happen. If you have any of these conditions, or  take one of these drugs, make sure to notify your treating physician. Infection: This is more common in patients with a compromised immune system, either due to disease (example: diabetes, cancer, human immunodeficiency virus [HIV], etc.), or due to medications or treatments (example: therapies used to treat cancer and rheumatological diseases). However, even if you do not have one on these, it can still happen. If you have any of these conditions, or take one of these drugs, make sure to notify your treating physician. Nerve Damage: This is more common when the treatment is an invasive one, but it can also happen with the use of medications, such as those used in the treatment of cancer. The damage can occur to small secondary nerves, or to large primary ones, such as those in the spinal cord and brain. This damage may be temporary or permanent and it may lead to impairments that can range from temporary numbness to permanent paralysis and/or brain death. Allergic Reactions: Any time a substance or material comes in contact with our body, there is the possibility of an allergic reaction. These can range from a mild skin rash (contact dermatitis) to a severe systemic reaction (anaphylactic reaction), which can result in death. Death: In general, any medical intervention can result in death, most of the time due to an unforeseen complication. ______________________________________________________________________

## 2023-06-09 ENCOUNTER — Ambulatory Visit: Payer: 59 | Admitting: Dermatology

## 2023-06-10 ENCOUNTER — Emergency Department: Payer: 59

## 2023-06-10 ENCOUNTER — Emergency Department
Admission: EM | Admit: 2023-06-10 | Discharge: 2023-06-10 | Disposition: A | Discharge status: Home / Self Care | Payer: 59 | Attending: Emergency Medicine | Admitting: Emergency Medicine

## 2023-06-10 ENCOUNTER — Other Ambulatory Visit: Payer: Self-pay

## 2023-06-10 DIAGNOSIS — I2510 Atherosclerotic heart disease of native coronary artery without angina pectoris: Secondary | ICD-10-CM | POA: Diagnosis not present

## 2023-06-10 DIAGNOSIS — M545 Low back pain, unspecified: Secondary | ICD-10-CM | POA: Insufficient documentation

## 2023-06-10 DIAGNOSIS — G8929 Other chronic pain: Secondary | ICD-10-CM | POA: Diagnosis not present

## 2023-06-10 DIAGNOSIS — Z992 Dependence on renal dialysis: Secondary | ICD-10-CM | POA: Diagnosis not present

## 2023-06-10 DIAGNOSIS — E1122 Type 2 diabetes mellitus with diabetic chronic kidney disease: Secondary | ICD-10-CM | POA: Diagnosis not present

## 2023-06-10 DIAGNOSIS — I120 Hypertensive chronic kidney disease with stage 5 chronic kidney disease or end stage renal disease: Secondary | ICD-10-CM | POA: Diagnosis not present

## 2023-06-10 DIAGNOSIS — N186 End stage renal disease: Secondary | ICD-10-CM | POA: Diagnosis not present

## 2023-06-10 DIAGNOSIS — J449 Chronic obstructive pulmonary disease, unspecified: Secondary | ICD-10-CM | POA: Insufficient documentation

## 2023-06-10 LAB — BASIC METABOLIC PANEL
Anion gap: 15 (ref 5–15)
BUN: 52 mg/dL — ABNORMAL HIGH (ref 6–20)
CO2: 26 mmol/L (ref 22–32)
Calcium: 10.1 mg/dL (ref 8.9–10.3)
Chloride: 94 mmol/L — ABNORMAL LOW (ref 98–111)
Creatinine, Ser: 14.69 mg/dL — ABNORMAL HIGH (ref 0.61–1.24)
GFR, Estimated: 4 mL/min — ABNORMAL LOW (ref >60)
Glucose, Bld: 118 mg/dL — ABNORMAL HIGH (ref 70–99)
Potassium: 4 mmol/L (ref 3.5–5.1)
Sodium: 135 mmol/L (ref 135–145)

## 2023-06-10 LAB — CBC WITH DIFFERENTIAL/PLATELET
Abs Immature Granulocytes: 0.02 10*3/uL (ref 0.00–0.07)
Basophils Absolute: 0 10*3/uL (ref 0.0–0.1)
Basophils Relative: 0 %
Eosinophils Absolute: 0.2 10*3/uL (ref 0.0–0.5)
Eosinophils Relative: 3 %
HCT: 32.9 % — ABNORMAL LOW (ref 39.0–52.0)
Hemoglobin: 10.9 g/dL — ABNORMAL LOW (ref 13.0–17.0)
Immature Granulocytes: 0 %
Lymphocytes Relative: 7 %
Lymphs Abs: 0.5 10*3/uL — ABNORMAL LOW (ref 0.7–4.0)
MCH: 31.1 pg (ref 26.0–34.0)
MCHC: 33.1 g/dL (ref 30.0–36.0)
MCV: 93.7 fL (ref 80.0–100.0)
Monocytes Absolute: 1.2 10*3/uL — ABNORMAL HIGH (ref 0.1–1.0)
Monocytes Relative: 17 %
Neutro Abs: 5 10*3/uL (ref 1.7–7.7)
Neutrophils Relative %: 73 %
Platelets: 233 10*3/uL (ref 150–400)
RBC: 3.51 MIL/uL — ABNORMAL LOW (ref 4.22–5.81)
RDW: 14.6 % (ref 11.5–15.5)
WBC: 7 10*3/uL (ref 4.0–10.5)
nRBC: 0 % (ref 0.0–0.2)

## 2023-06-10 MED ORDER — LIDOCAINE 5 % EX PTCH: 1.0000 | MEDICATED_PATCH | Freq: Two times a day (BID) | CUTANEOUS | 0 refills | Status: DC

## 2023-06-10 MED ORDER — LIDOCAINE 5 % EX PTCH
1.0000 | MEDICATED_PATCH | CUTANEOUS | Status: DC
  Administered 2023-06-10: 1 via TRANSDERMAL
  Filled 2023-06-10: qty 1

## 2023-06-10 MED ORDER — OXYCODONE-ACETAMINOPHEN 5-325 MG PO TABS
1.0000 | ORAL_TABLET | Freq: Once | ORAL | Status: AC
  Administered 2023-06-10: 1 via ORAL
  Filled 2023-06-10: qty 1

## 2023-06-10 NOTE — ED Provider Notes (Signed)
 [facility] Provider Note    Event Date/Time   First MD Initiated Contact with Patient 06/10/23 0710     (approximate)   History   Chief Complaint Back Pain   HPI  Donald Fernandez is a 52 y.o. male with past medical history of hypertension, diabetes, CAD, COPD, ESRD on HD (MWF), and chronic pain syndrome who presents to the ED complaining of back pain.  Patient reports that he has had intermittent issues with back pain over multiple years, has had a flareup of pain in his lower back over the past 1 to 2 weeks.  He describes pain over both sides of his lower back radiating down the back of both legs.  He has not had any numbness or weakness in his legs and denies any numbness in his groin.  He denies any bowel or bladder incontinence, has been walking without difficulty until this morning, when he states his back "locked up."  He was unable to make it to his dialysis appointment this morning due to this, has not taken anything for pain this morning.  He has been following with pain management for this issue, states he has injection scheduled for his back next month.  He denies any recent trauma to his back, but does report frequent "popping" feeling in his back.     Physical Exam   Triage Vital Signs: ED Triage Vitals  Encounter Vitals Group     BP 06/10/23 0556 114/80     Systolic BP Percentile --      Diastolic BP Percentile --      Pulse Rate 06/10/23 0556 91     Resp 06/10/23 0556 18     Temp 06/10/23 0556 98.5 F (36.9 C)     Temp src --      SpO2 06/10/23 0556 96 %     Weight 06/10/23 0555 (!) 321 lb 14 oz (146 kg)     Height 06/10/23 0555 5\' 10"  (1.778 m)     Head Circumference --      Peak Flow --      Pain Score 06/10/23 0555 9     Pain Loc --      Pain Education --      Exclude from Growth Chart --     Most recent vital signs: Vitals:   06/10/23 0556  BP: 114/80  Pulse: 91  Resp: 18  Temp: 98.5 F (36.9 C)  SpO2: 96%     Constitutional: Alert and oriented. Eyes: Conjunctivae are normal. Head: Atraumatic. Nose: No congestion/rhinnorhea. Mouth/Throat: Mucous membranes are moist.  Cardiovascular: Normal rate, regular rhythm. Grossly normal heart sounds.  2+ radial and DP pulses bilaterally. Respiratory: Normal respiratory effort.  No retractions. Lungs CTAB. Gastrointestinal: Soft and nontender. No distention. Musculoskeletal: No lower extremity tenderness nor edema.  Midline tenderness to palpation over the lumbar spine along with paraspinal tenderness. Neurologic:  Normal speech and language. No gross focal neurologic deficits are appreciated.    ED Results / Procedures / Treatments   Labs (all labs ordered are listed, but only abnormal results are displayed) Labs Reviewed  BASIC METABOLIC PANEL - Abnormal; Notable for the following components:      Result Value   Chloride 94 (*)    Glucose, Bld 118 (*)    BUN 52 (*)    Creatinine, Ser 14.69 (*)    GFR, Estimated 4 (*)    All other components within normal limits  CBC WITH DIFFERENTIAL/PLATELET -  Abnormal; Notable for the following components:   RBC 3.51 (*)    Hemoglobin 10.9 (*)    HCT 32.9 (*)    Lymphs Abs 0.5 (*)    Monocytes Absolute 1.2 (*)    All other components within normal limits     RADIOLOGY Lumbar spine x-ray reviewed and interpreted by me with no fracture or dislocation.  PROCEDURES:  Critical Care performed: No  Procedures   MEDICATIONS ORDERED IN ED: Medications  lidocaine (LIDODERM) 5 % 1 patch (1 patch Transdermal Patch Applied 06/10/23 0749)  oxyCODONE-acetaminophen (PERCOCET/ROXICET) 5-325 MG per tablet 1 tablet (1 tablet Oral Given 06/10/23 0750)     IMPRESSION / MDM / ASSESSMENT AND PLAN / ED COURSE  I reviewed the triage vital signs and the nursing notes.                              53 y.o. male with past medical history of hypertension, diabetes, CAD, COPD, chronic pain syndrome, and ESRD on HD  who presents to the ED complaining of acute on chronic lower back pain this morning.  Patient's presentation is most consistent with acute complicated illness / injury requiring diagnostic workup.  Differential diagnosis includes, but is not limited to, lumbar strain, lumbar radiculopathy, cauda equina.  Patient nontoxic-appearing and in no acute distress, vital signs are unremarkable.  He is neurovascularly intact to his bilateral lower extremities with no features concerning for cauda equina.  With his described popping sensation, will further assess with x-ray.  We will treat with Percocet and Lidoderm patch, patient did miss his dialysis appointment earlier this morning but no symptoms concerning for hyperkalemia or pulmonary edema.  Lumbar spine x-ray is unremarkable, labs consistent with known ESRD but no acute electrolyte abnormality, anemia, or leukocytosis noted.  Patient reports he does not have transportation to get to dialysis today, nephrology team contacted and dialysis coordinator was able to get patient an appointment for later this morning.  Patient provided with Voucher to dialysis center, was counseled to follow-up with pain management and to return to the ED for new or worsening symptoms.  Patient agrees with plan.      FINAL CLINICAL IMPRESSION(S) / ED DIAGNOSES   Final diagnoses:  Chronic bilateral low back pain without sciatica  ESRD on hemodialysis (HCC)     Rx / DC Orders   ED Discharge Orders          Ordered    lidocaine (LIDODERM) 5 %  Every 12 hours        06/10/23 0957             Note:  This document was prepared using Dragon voice recognition software and may include unintentional dictation errors.   Chesley Noon, MD 06/10/23 [phone]

## 2023-06-10 NOTE — TOC CM/SW Note (Signed)
 Cab voucher provided to get to HD center in [place]. [facility] Neurosurgeon. HD center will coordinate getting patient home after HD.  Charlynn Court, CSW [phone]

## 2023-06-10 NOTE — ED Triage Notes (Signed)
 Pt reports lower back pain that began last night, pt is a dialysis pt states when he woke up this morning he got up pain was worse. Pain goes in to right leg, pt has hx chronic back pain.

## 2023-06-10 NOTE — ED Notes (Signed)
 See triage note  Presents with lower back pain.  States pain is in mid back and radiates into both legs  Denies any injury

## 2023-06-14 ENCOUNTER — Ambulatory Visit: Attending: Pain Medicine | Admitting: Pain Medicine

## 2023-06-14 ENCOUNTER — Encounter: Payer: Self-pay | Admitting: Pain Medicine

## 2023-06-14 ENCOUNTER — Ambulatory Visit
Admission: RE | Admit: 2023-06-14 | Discharge: 2023-06-14 | Disposition: A | Discharge status: Home / Self Care | Source: Ambulatory Visit | Attending: Pain Medicine | Admitting: Pain Medicine

## 2023-06-14 VITALS — BP 196/104 | HR 87 | Temp 97.3°F | Resp 16 | Ht 70.0 in | Wt 321.9 lb

## 2023-06-14 DIAGNOSIS — Z6841 Body Mass Index (BMI) 40.0 and over, adult: Secondary | ICD-10-CM | POA: Insufficient documentation

## 2023-06-14 DIAGNOSIS — M47816 Spondylosis without myelopathy or radiculopathy, lumbar region: Secondary | ICD-10-CM

## 2023-06-14 DIAGNOSIS — M79604 Pain in right leg: Secondary | ICD-10-CM | POA: Diagnosis present

## 2023-06-14 DIAGNOSIS — M545 Low back pain, unspecified: Secondary | ICD-10-CM

## 2023-06-14 DIAGNOSIS — G8929 Other chronic pain: Secondary | ICD-10-CM | POA: Diagnosis not present

## 2023-06-14 DIAGNOSIS — M79605 Pain in left leg: Secondary | ICD-10-CM | POA: Diagnosis present

## 2023-06-14 DIAGNOSIS — M47817 Spondylosis without myelopathy or radiculopathy, lumbosacral region: Secondary | ICD-10-CM | POA: Diagnosis present

## 2023-06-14 DIAGNOSIS — M5459 Other low back pain: Secondary | ICD-10-CM | POA: Diagnosis present

## 2023-06-14 MED ORDER — FENTANYL CITRATE (PF) 100 MCG/2ML IJ SOLN
25.0000 ug | INTRAMUSCULAR | Status: DC | PRN
  Administered 2023-06-14: 50 ug via INTRAVENOUS

## 2023-06-14 MED ORDER — ROPIVACAINE HCL 2 MG/ML IJ SOLN
18.0000 mL | Freq: Once | INTRAMUSCULAR | Status: AC
  Administered 2023-06-14: 18 mL via PERINEURAL

## 2023-06-14 MED ORDER — TRIAMCINOLONE ACETONIDE 40 MG/ML IJ SUSP
80.0000 mg | Freq: Once | INTRAMUSCULAR | Status: AC
  Administered 2023-06-14: 80 mg

## 2023-06-14 MED ORDER — MIDAZOLAM HCL 5 MG/5ML IJ SOLN
0.5000 mg | Freq: Once | INTRAMUSCULAR | Status: AC
  Administered 2023-06-14: 2 mg via INTRAVENOUS

## 2023-06-14 MED ORDER — TRIAMCINOLONE ACETONIDE 40 MG/ML IJ SUSP
INTRAMUSCULAR | Status: AC
  Filled 2023-06-14: qty 2

## 2023-06-14 MED ORDER — LIDOCAINE HCL 2 % IJ SOLN
20.0000 mL | Freq: Once | INTRAMUSCULAR | Status: AC
  Administered 2023-06-14: 400 mg

## 2023-06-14 MED ORDER — FENTANYL CITRATE (PF) 100 MCG/2ML IJ SOLN
INTRAMUSCULAR | Status: AC
  Filled 2023-06-14: qty 2

## 2023-06-14 MED ORDER — PENTAFLUOROPROP-TETRAFLUOROETH EX AERO
INHALATION_SPRAY | Freq: Once | CUTANEOUS | Status: AC
  Administered 2023-06-14: 30 via TOPICAL

## 2023-06-14 MED ORDER — ROPIVACAINE HCL 2 MG/ML IJ SOLN
INTRAMUSCULAR | Status: AC
  Filled 2023-06-14: qty 20

## 2023-06-14 MED ORDER — MIDAZOLAM HCL 5 MG/5ML IJ SOLN
INTRAMUSCULAR | Status: AC
  Filled 2023-06-14: qty 5

## 2023-06-14 MED ORDER — LIDOCAINE HCL 2 % IJ SOLN
INTRAMUSCULAR | Status: AC
  Filled 2023-06-14: qty 20

## 2023-06-14 NOTE — Progress Notes (Signed)
 Safety precautions to be maintained throughout the outpatient stay will include: orient to surroundings, keep bed in low position, maintain call bell within reach at all times, provide assistance with transfer out of bed and ambulation.

## 2023-06-14 NOTE — Patient Instructions (Signed)
 ______________________________________________________________________    Post-Procedure Discharge Instructions  Instructions: Apply ice:  Purpose: This will minimize any swelling and discomfort after procedure.  When: Day of procedure, as soon as you get home. How: Fill a plastic sandwich bag with crushed ice. Cover it with a small towel and apply to injection site. How long: (15 min on, 15 min off) Apply for 15 minutes then remove x 15 minutes.  Repeat sequence on day of procedure, until you go to bed. Apply heat:  Purpose: To treat any soreness and discomfort from the procedure. When: Starting the next day after the procedure. How: Apply heat to procedure site starting the day following the procedure. How long: May continue to repeat daily, until discomfort goes away. Food intake: Start with clear liquids (like water) and advance to regular food, as tolerated.  Physical activities: Keep activities to a minimum for the first 8 hours after the procedure. After that, then as tolerated. Driving: If you have received any sedation, be responsible and do not drive. You are not allowed to drive for 24 hours after having sedation. Blood thinner: (Applies only to those taking blood thinners) You may restart your blood thinner 6 hours after your procedure. Insulin: (Applies only to Diabetic patients taking insulin) As soon as you can eat, you may resume your normal dosing schedule. Infection prevention: Keep procedure site clean and dry. Shower daily and clean area with soap and water. Post-procedure Pain Diary: Extremely important that this be done correctly and accurately. Recorded information will be used to determine the next step in treatment. For the purpose of accuracy, follow these rules: Evaluate only the area treated. Do not report or include pain from an untreated area. For the purpose of this evaluation, ignore all other areas of pain, except for the treated area. After your procedure,  avoid taking a long nap and attempting to complete the pain diary after you wake up. Instead, set your alarm clock to go off every hour, on the hour, for the initial 8 hours after the procedure. Document the duration of the numbing medicine, and the relief you are getting from it. Do not go to sleep and attempt to complete it later. It will not be accurate. If you received sedation, it is likely that you were given a medication that may cause amnesia. Because of this, completing the diary at a later time may cause the information to be inaccurate. This information is needed to plan your care. Follow-up appointment: Keep your post-procedure follow-up evaluation appointment after the procedure (usually 2 weeks for most procedures, 6 weeks for radiofrequencies). DO NOT FORGET to bring you pain diary with you.   Expect: (What should I expect to see with my procedure?) From numbing medicine (AKA: Local Anesthetics): Numbness or decrease in pain. You may also experience some weakness, which if present, could last for the duration of the local anesthetic. Onset: Full effect within 15 minutes of injected. Duration: It will depend on the type of local anesthetic used. On the average, 1 to 8 hours.  From steroids (Applies only if steroids were used): Decrease in swelling or inflammation. Once inflammation is improved, relief of the pain will follow. Onset of benefits: Depends on the amount of swelling present. The more swelling, the longer it will take for the benefits to be seen. In some cases, up to 10 days. Duration: Steroids will stay in the system x 2 weeks. Duration of benefits will depend on multiple posibilities including persistent irritating factors. Side-effects: If  present, they may typically last 2 weeks (the duration of the steroids). Frequent: Cramps (if they occur, drink Gatorade and take over-the-counter Magnesium 450-500 mg once to twice a day); water retention with temporary weight gain;  increases in blood sugar; decreased immune system response; increased appetite. Occasional: Facial flushing (red, warm cheeks); mood swings; menstrual changes. Uncommon: Long-term decrease or suppression of natural hormones; bone thinning. (These are more common with higher doses or more frequent use. This is why we prefer that our patients avoid having any injection therapies in other practices.)  Very Rare: Severe mood changes; psychosis; aseptic necrosis. From procedure: Some discomfort is to be expected once the numbing medicine wears off. This should be minimal if ice and heat are applied as instructed.  Call if: (When should I call?) You experience numbness and weakness that gets worse with time, as opposed to wearing off. New onset bowel or bladder incontinence. (Applies only to procedures done in the spine)  Emergency Numbers: Durning business hours (Monday - Thursday, 8:00 AM - 4:00 PM) (Friday, 9:00 AM - 12:00 Noon): [phone] After hours: [phone] NOTE: If you are having a problem and are unable connect with, or to talk to a provider, then go to your nearest urgent care or emergency department. If the problem is serious and urgent, please call 911. ______________________________________________________________________      ______________________________________________________________________    Patient information on: Body mass index (BMI) and Weight Management  Dear [name] you are receiving this information because your weight may be adversely affecting your health.   Your current Estimated body mass index is 46.18 kg/m as calculated from the following:   Height as of 06/10/23: 5\' 10"  (1.778 m).   Weight as of 06/10/23: 321 lb 14 oz (146 kg).  We recommend you talk to your primary care physician about providing or referring you to a supervised weight management program.  Here is some information about weight and the body mass index (BMI) classification:  BMI  is a measure of obesity that's calculated by dividing a person's weight in kilograms by their height in meters squared. A person can use an online calculator to determine their BMI. Body mass index (BMI) is a common tool for deciding whether a person has an appropriate body weight.  It measures a person's weight in relation to their height.  According to the [facility] of health (NIH): A BMI of less than 18.5 means that a person is underweight. A BMI of between 18.5 and 24.9 is ideal. A BMI of between 25 and 29.9 is overweight. A BMI over 30 indicates obesity.  Body Mass Index (BMI) Classification BMI level (kg/m2) Category Associated incidence of chronic pain  <18  Underweight   18.5-24.9 Ideal body weight   25-29.9 Overweight  20%  30-34.9 Obese (Class I)  68%  35-39.9 Severe obesity (Class II)  136%  >40 Extreme obesity (Class III)  254%    Morbidly Obese Classification: You will be considered to be "Morbidly Obese" if your BMI is above 30 and you have one or more of the following conditions caused or associated to obesity: 1.    Type 2 Diabetes (Leading to cardiovascular diseases (CVD), stroke, peripheral vascular diseases (PVD), retinopathy, nephropathy, and neuropathy) 2.    Cardiovascular Disease (High Blood Pressure; Congestive Heart Failure; High Cholesterol; Coronary Artery Disease; Angina; Arrhythmias, Dysrhythmias, or Heart Attacks) 3.    Breathing problems (Asthma; obesity-hypoventilation syndrome; obstructive sleep apnea; chronic inflammatory airway disease; reactive airway disease;  or shortness of breath) 4.    Chronic kidney disease 5.    Liver disease (nonalcoholic fatty liver disease) 6.    High blood pressure 7.    Acid reflux (gastroesophageal reflux disease; heartburn) 8.    Osteoarthritis (OA) (affecting the hip(s), the knee(s) and/or the lower back) (usually requiring knee and/or hip replacements, as well as back surgeries) 9.    Low back pain (Lumbar Facet  Syndrome; and/or Degenerative Disc Disease) 10.  Hip pain (Osteoarthritis of hip) (For every 1 lbs of added body weight, there is a 2 lbs increase in pressure inside of each hip articulation. 1:2 mechanical relationship) 11.  Knee pain (Osteoarthritis of knee) (For every 1 lbs of added body weight, there is a 4 lbs increase in pressure inside of each knee articulation. 1:4 mechanical relationship) (patients with a BMI>30 kg/m2 were 6.8 times more likely to develop knee OA than normal-weight individuals) 12.  Cancer: Epidemiological studies have shown that obesity is a risk factor for: post-menopausal breast cancer; cancers of the endometrium, colon and kidney cancer; malignant adenomas of the esophagus. Obese subjects have an approximately 1.5-3.5-fold increased risk of developing these cancers compared with normal-weight subjects, and it has been estimated that between 15 and 45% of these cancers can be attributed to overweight. More recent studies suggest that obesity may also increase the risk of other types of cancer, including pancreatic, hepatic and gallbladder cancer. (Ref: Obesity and cancer. Pischon T, Nthlings U, [employer] H. Proc Nutr Soc. 2008 May;67(2):128-45. doi: 10.1017/S0029665108006976.) The International Agency for Research on Cancer (IARC) has identified 13 cancers associated with overweight and obesity: meningioma, multiple myeloma, adenocarcinoma of the esophagus, and cancers of the thyroid, postmenopausal breast cancer, gallbladder, stomach, liver, pancreas, kidney, ovaries, uterus, colon and rectal (colorectal) cancers. 55 percent of all cancers diagnosed in women and 24 percent of those diagnosed in men are associated with overweight and obesity.  Recommendation: If you have any of the above conditions it is urgent that you take a step back and concentrate in losing weight. Dedicate 100% of your efforts on this task. Nothing else will improve your health more than bringing your weight  down and your BMI to less than 30.   Nutritionist and/or supervised weight-management program: We are aware that most chronic pain patients are unable to exercise secondary to their pain. For this reason, you must rely on proper nutrition and diet in order to lose the weight. We recommend you talk to a nutritionist.   Bariatric surgery: A person might be considered a candidate for bariatric surgery if they meet one of the following BMI criteria:  BMI of 40 or higher: This is considered extreme obesity (Class III). BMI of 35-39.9: This is considered obesity, and the person might also have a serious weight-related health condition, such as high blood pressure, type 2 diabetes, or severe sleep apnea  BMI of 30-34.9: This might be considered if the person has serious weight-related health problems and hasn't had substantial weight loss or improvement in co-morbidities through other methods   On your own: A realistic goal is to lose 10% of your body weight over a period of 12 months.  If over a period of six (6) months you have unsuccessfully tried to lose weight, then it is time for you to seek professional help and to enter a medically supervised weight management program, and/or undergo bariatric surgery.   Pain management considerations and possible limitations:  1.    Pharmacological Problems: Be advised that  the use of opioid analgesics (oxycodone; hydrocodone; morphine; methadone; codeine; and all of their derivatives) have been associated with decreased metabolism and weight gain.  For this reason, should we see that you are unable to lose weight while taking these medications, it may become necessary for [place] to taper down and indefinitely discontinue them.  2.    Technical Problems: The incidence of successful interventional therapies decreases as the patient's BMI increases. It is much more difficult to accomplish a safe and effective interventional therapy on a patient with a BMI above 35. 3.     Radiation Exposure Problems: The x-rays machine, used to accomplish injection therapies, will automatically increase their x-ray output in order to capture an appropriate bone image. This means that radiation exposure increases exponentially with the patient's BMI. (The higher the BMI, the higher the radiation exposure.) Although the level of radiation used at a given time is still safe to the patient, it is not for the physician and/or assisting staff. Unfortunately, radiation exposure is accumulative. Because physicians and the staff have to do procedures and be exposed on a daily basis, this can result in health problems such as cancer and radiation burns. Radiation exposure to the staff is monitored by the radiation batches that they wear. The exposure levels are reported back to the staff on a quarterly basis. Depending on levels of exposure, physicians and staff may be obligated by law to decrease this exposure. This means that they have the right and obligation to refuse providing therapies where they may be overexposed to radiation. For this reason, physicians may decline to offer therapies such as radiofrequency ablation or implants to patients with a BMI above 40. 4.    Current Trends: Be advised that the current trend is to no longer offer certain therapies to patients with a BMI equal to, or above 35, due to increase perioperative risks, increased technical procedural difficulties, and excessive radiation exposure to healthcare personnel.  Last updated: 01/03/2023 ______________________________________________________________________

## 2023-06-14 NOTE — Progress Notes (Signed)
 PROVIDER NOTE: Interpretation of information contained herein should be left to medically-trained personnel. Specific patient instructions are provided elsewhere under "Patient Instructions" section of medical record. This document was created in part using STT-dictation technology, any transcriptional errors that may result from this process are unintentional.  Patient: Donald Fernandez Type: Established DOB: 20-Dec-1970 MRN: [id number] PCP: Hillery Aldo, MD  Service: Procedure DOS: 06/14/2023 Setting: Ambulatory Location: Ambulatory outpatient facility Delivery: Face-to-face Provider: Oswaldo Done, MD Specialty: Interventional Pain Management Specialty designation: 09 Location: Outpatient facility Ref. Prov.: Hillery Aldo, MD       Interventional Therapy   Type: Lumbar Facet, Medial Branch Block(s) (w/ fluoroscopic mapping) #4  Laterality: Bilateral  Level: L2, L3, L4, L5, and S1 Medial Branch Level(s). Injecting these levels blocks the L3-4, L4-5, and L5-S1 lumbar facet joints. Imaging: Fluoroscopic guidance Spinal ([id number]) Anesthesia: Local anesthesia (1-2% Lidocaine) Anxiolysis: IV Versed         Sedation: Moderate Sedation Fentanyl         DOS: 06/14/2023 Performed by: Oswaldo Done, MD  Primary Purpose: Diagnostic/Therapeutic Indications: Low back pain severe enough to impact quality of life or function. 1. Chronic low back pain (1ry area of Pain) (Bilateral) (L>R) w/o sciatica   2. Chronic low back pain radiating to legs (Bilateral)   3. Lumbar facet joint pain   4. Lumbar facet hypertrophy (Multilevel) (Bilateral)   5. Lumbar facet syndrome (Bilateral) (L>R)   6. Spondylosis without myelopathy or radiculopathy, lumbosacral region   7. Morbid obesity with BMI of 45.0-49.9, adult (HCC)    NAS-11 Pain score:   Pre-procedure: 6 /10   Post-procedure: 3 /10     Position / Prep / Materials:  Position: Prone  Prep solution: ChloraPrep (2% chlorhexidine gluconate  and 70% isopropyl alcohol) Area Prepped: Posterolateral Lumbosacral Spine (Wide prep: From the lower border of the scapula down to the end of the tailbone and from flank to flank.)  Materials:  Tray: Block Needle(s):  Type: Spinal  Gauge (G): 22  Length: 5-in Qty: 4      H&P (Pre-op Assessment):  Donald Fernandez is a 53 y.o. (year old), male patient, seen today for interventional treatment. He  has a past surgical history that includes Ankle surgery (Right); AV fistula placement (Left, 08/04/2016); IR ANGIO INTRA EXTRACRAN SEL INTERNAL CAROTID UNI R MOD SED (12/17/2016); Radiology with anesthesia (N/A, 12/17/2016); DIALYSIS/PERMA CATHETER INSERTION (N/A, 06/06/2017); A/V Fistulagram (N/A, 06/08/2017); AV Fistula Insertion w/RF Magnetic Guidance (Left, 09/02/2017); Upper Extremity Angiography (Left, 10/19/2017); Revison of arteriovenous fistula (Left, 01/04/2018); and DIALYSIS/PERMA CATHETER REMOVAL (N/A, 05/04/2018). Donald Fernandez has a current medication list which includes the following prescription(s): amlodipine, ammonium lactate, anastrozole, aspirin ec, calcium acetate, clindamycin, clobetasol, clonidine, fluticasone, gabapentin, hydroxyzine, lidocaine, omeprazole, pantoprazole, tamsulosin, testosterone, topiramate, trazodone, and metoprolol succinate, and the following Facility-Administered Medications: fentanyl. His primarily concern today is the Back Pain  Initial Vital Signs:  Pulse/HCG Rate: 87ECG Heart Rate: 94 (nsr) Temp: (!) 97.3 F (36.3 C) Resp: 20 BP: (!) 163/99 SpO2: 100 %  BMI: Estimated body mass index is 46.18 kg/m as calculated from the following:   Height as of this encounter: 5\' 10"  (1.778 m).   Weight as of this encounter: 321 lb 14 oz (146 kg).  Risk Assessment: Allergies: Reviewed. He is allergic to accupril [quinapril hcl] and ace inhibitors.  Allergy Precautions: None required Coagulopathies: Reviewed. None identified.  Blood-thinner therapy: None at this time Active  Infection(s): Reviewed. None identified. Donald Fernandez is afebrile  Site Confirmation:  Donald Fernandez was asked to confirm the procedure and laterality before marking the site Procedure checklist: Completed Consent: Before the procedure and under the influence of no sedative(s), amnesic(s), or anxiolytics, the patient was informed of the treatment options, risks and possible complications. To fulfill our ethical and legal obligations, as recommended by the American Medical Association's Code of Ethics, I have informed the patient of my clinical impression; the nature and purpose of the treatment or procedure; the risks, benefits, and possible complications of the intervention; the alternatives, including doing nothing; the risk(s) and benefit(s) of the alternative treatment(s) or procedure(s); and the risk(s) and benefit(s) of doing nothing. The patient was provided information about the general risks and possible complications associated with the procedure. These may include, but are not limited to: failure to achieve desired goals, infection, bleeding, organ or nerve damage, allergic reactions, paralysis, and death. In addition, the patient was informed of those risks and complications associated to Spine-related procedures, such as failure to decrease pain; infection (i.e.: Meningitis, epidural or intraspinal abscess); bleeding (i.e.: epidural hematoma, subarachnoid hemorrhage, or any other type of intraspinal or peri-dural bleeding); organ or nerve damage (i.e.: Any type of peripheral nerve, nerve root, or spinal cord injury) with subsequent damage to sensory, motor, and/or autonomic systems, resulting in permanent pain, numbness, and/or weakness of one or several areas of the body; allergic reactions; (i.e.: anaphylactic reaction); and/or death. Furthermore, the patient was informed of those risks and complications associated with the medications. These include, but are not limited to: allergic reactions (i.e.:  anaphylactic or anaphylactoid reaction(s)); adrenal axis suppression; blood sugar elevation that in diabetics may result in ketoacidosis or comma; water retention that in patients with history of congestive heart failure may result in shortness of breath, pulmonary edema, and decompensation with resultant heart failure; weight gain; swelling or edema; medication-induced neural toxicity; particulate matter embolism and blood vessel occlusion with resultant organ, and/or nervous system infarction; and/or aseptic necrosis of one or more joints. Finally, the patient was informed that Medicine is not an exact science; therefore, there is also the possibility of unforeseen or unpredictable risks and/or possible complications that may result in a catastrophic outcome. The patient indicated having understood very clearly. We have given the patient no guarantees and we have made no promises. Enough time was given to the patient to ask questions, all of which were answered to the patient's satisfaction. Mr. Lottman has indicated that he wanted to continue with the procedure. Attestation: I, the ordering provider, attest that I have discussed with the patient the benefits, risks, side-effects, alternatives, likelihood of achieving goals, and potential problems during recovery for the procedure that I have provided informed consent. Date  Time: 06/14/2023  7:56 AM   Pre-Procedure Preparation:  Monitoring: As per clinic protocol. Respiration, ETCO2, SpO2, BP, heart rate and rhythm monitor placed and checked for adequate function Safety Precautions: Patient was assessed for positional comfort and pressure points before starting the procedure. Time-out: I initiated and conducted the "Time-out" before starting the procedure, as per protocol. The patient was asked to participate by confirming the accuracy of the "Time Out" information. Verification of the correct person, site, and procedure were performed and confirmed by me,  the nursing staff, and the patient. "Time-out" conducted as per Joint Commission's Universal Protocol (UP.01.01.01). Time: 0829 Start Time: 0829 hrs.  Description of Procedure:          Laterality: (see above) Targeted Levels: (see above)  Safety Precautions: Aspiration looking for blood return was  conducted prior to all injections. At no point did we inject any substances, as a needle was being advanced. Before injecting, the patient was told to immediately notify me if he was experiencing any new onset of "ringing in the ears, or metallic taste in the mouth". No attempts were made at seeking any paresthesias. Safe injection practices and needle disposal techniques used. Medications properly checked for expiration dates. SDV (single dose vial) medications used. After the completion of the procedure, all disposable equipment used was discarded in the proper designated medical waste containers. Local Anesthesia: Protocol guidelines were followed. The patient was positioned over the fluoroscopy table. The area was prepped in the usual manner. The time-out was completed. The target area was identified using fluoroscopy. A 12-in long, straight, sterile hemostat was used with fluoroscopic guidance to locate the targets for each level blocked. Once located, the skin was marked with an approved surgical skin marker. Once all sites were marked, the skin (epidermis, dermis, and hypodermis), as well as deeper tissues (fat, connective tissue and muscle) were infiltrated with a small amount of a short-acting local anesthetic, loaded on a 10cc syringe with a 25G, 1.5-in  Needle. An appropriate amount of time was allowed for local anesthetics to take effect before proceeding to the next step. Local Anesthetic: Lidocaine 2.0% The unused portion of the local anesthetic was discarded in the proper designated containers. Technical description of process:  L2 Medial Branch Nerve Block (MBB): The target area for the L2  medial branch is at the junction of the postero-lateral aspect of the superior articular process and the superior, posterior, and medial edge of the transverse process of L3. Under fluoroscopic guidance, a Quincke needle was inserted until contact was made with os over the superior postero-lateral aspect of the pedicular shadow (target area). After negative aspiration for blood, 0.5 mL of the nerve block solution was injected without difficulty or complication. The needle was removed intact. L3 Medial Branch Nerve Block (MBB): The target area for the L3 medial branch is at the junction of the postero-lateral aspect of the superior articular process and the superior, posterior, and medial edge of the transverse process of L4. Under fluoroscopic guidance, a Quincke needle was inserted until contact was made with os over the superior postero-lateral aspect of the pedicular shadow (target area). After negative aspiration for blood, 0.5 mL of the nerve block solution was injected without difficulty or complication. The needle was removed intact. L4 Medial Branch Nerve Block (MBB): The target area for the L4 medial branch is at the junction of the postero-lateral aspect of the superior articular process and the superior, posterior, and medial edge of the transverse process of L5. Under fluoroscopic guidance, a Quincke needle was inserted until contact was made with os over the superior postero-lateral aspect of the pedicular shadow (target area). After negative aspiration for blood, 0.5 mL of the nerve block solution was injected without difficulty or complication. The needle was removed intact. L5 Medial Branch Nerve Block (MBB): The target area for the L5 medial branch is at the junction of the postero-lateral aspect of the superior articular process and the superior, posterior, and medial edge of the sacral ala. Under fluoroscopic guidance, a Quincke needle was inserted until contact was made with os over the  superior postero-lateral aspect of the pedicular shadow (target area). After negative aspiration for blood, 0.5 mL of the nerve block solution was injected without difficulty or complication. The needle was removed intact. S1 Medial Branch Nerve Block (  MBB): The target area for the S1 medial branch is at the posterior and inferior 6 o'clock position of the L5-S1 facet joint. Under fluoroscopic guidance, the Quincke needle inserted for the L5 MBB was redirected until contact was made with os over the inferior and postero aspect of the sacrum, at the 6 o' clock position under the L5-S1 facet joint (Target area). After negative aspiration for blood, 0.5 mL of the nerve block solution was injected without difficulty or complication. The needle was removed intact.  Once the entire procedure was completed, the treated area was cleaned, making sure to leave some of the prepping solution back to take advantage of its long term bactericidal properties.         Illustration of the posterior view of the lumbar spine and the posterior neural structures. Laminae of L2 through S1 are labeled. DPRL5, dorsal primary ramus of L5; DPRS1, dorsal primary ramus of S1; DPR3, dorsal primary ramus of L3; FJ, facet (zygapophyseal) joint L3-L4; I, inferior articular process of L4; LB1, lateral branch of dorsal primary ramus of L1; IAB, inferior articular branches from L3 medial branch (supplies L4-L5 facet joint); IBP, intermediate branch plexus; MB3, medial branch of dorsal primary ramus of L3; NR3, third lumbar nerve root; S, superior articular process of L5; SAB, superior articular branches from L4 (supplies L4-5 facet joint also); TP3, transverse process of L3.   Facet Joint Innervation (* possible contribution)  L1-2 T12, L1 (L2*)  Medial Branch  L2-3 L1, L2 (L3*)         "          "  L3-4 L2, L3 (L4*)         "          "  L4-5 L3, L4 (L5*)         "          "  L5-S1 L4, L5, S1          "          "    Vitals:    06/14/23 0839 06/14/23 0849 06/14/23 0859 06/14/23 0905  BP: (!) 189/104 (!) 191/103 (!) 192/103 (!) 196/104  Pulse:      Resp: 18 16 18 16   Temp:      SpO2: 100% 100% 100% 99%  Weight:      Height:         End Time: 0839 hrs.  Imaging Guidance (Spinal):          Type of Imaging Technique: Fluoroscopy Guidance (Spinal) Indication(s): Fluoroscopy guidance for needle placement to enhance accuracy in procedures requiring precise needle localization for targeted delivery of medication in or near specific anatomical locations not easily accessible without such real-time imaging assistance. Exposure Time: Please see nurses notes. Contrast: None used. Fluoroscopic Guidance: I was personally present during the use of fluoroscopy. "Tunnel Vision Technique" used to obtain the best possible view of the target area. Parallax error corrected before commencing the procedure. "Direction-depth-direction" technique used to introduce the needle under continuous pulsed fluoroscopy. Once target was reached, antero-posterior, oblique, and lateral fluoroscopic projection used confirm needle placement in all planes. Images permanently stored in EMR. Interpretation: No contrast injected. I personally interpreted the imaging intraoperatively. Adequate needle placement confirmed in multiple planes. Permanent images saved into the patient's record.  Post-operative Assessment:  Post-procedure Vital Signs:  Pulse/HCG Rate: 8782 Temp: (!) 97.3 F (36.3 C) Resp: 16 BP: (!) 196/104 (right calf) SpO2: 99 %  EBL:  None  Complications: No immediate post-treatment complications observed by team, or reported by patient.  Note: The patient tolerated the entire procedure well. A repeat set of vitals were taken after the procedure and the patient was kept under observation following institutional policy, for this type of procedure. Post-procedural neurological assessment was performed, showing return to baseline, prior to  discharge. The patient was provided with post-procedure discharge instructions, including a section on how to identify potential problems. Should any problems arise concerning this procedure, the patient was given instructions to immediately contact us, at any time, without hesitation. In any case, we plan to contact the patient by telephone for a follow-up status report regarding this interventional procedure.  Comments:  No additional relevant information.  Plan of Care (POC)  Orders:  Orders Placed This Encounter  Procedures   LUMBAR FACET(MEDIAL BRANCH NERVE BLOCK) MBNB    Scheduling Instructions:     Procedure: Lumbar facet block (AKA.: Lumbosacral medial branch nerve block)     Side: Bilateral     Level: L3-4, L4-5, L5-S1, and TBD Facets (L2, L3, L4, L5, S1, and TBD Medial Branch Nerves)     Sedation: Patient's choice.     Timeframe: Today    Where will this procedure be performed?:   [facility]   [facility] C-ARM 1-60 MIN NO REPORT    Intraoperative interpretation by procedural physician at [facility].    Standing Status:   Standing    Number of Occurrences:   1    Reason for exam::   Assistance in needle guidance and placement for procedures requiring needle placement in or near specific anatomical locations not easily accessible without such assistance.   Amb Ref to Medical Weight Management    Referral Priority:   Routine    Referral Type:   Consultation    Referral Reason:   Specialty Services Required    Number of Visits Requested:   1   Amb Referral to Bariatric Surgery    Referral Priority:   Routine    Referral Type:   Consultation    Referral Reason:   Specialty Services Required    Number of Visits Requested:   1   Amb Ref to Medical Weight Management    Referral Priority:   Routine    Referral Type:   Consultation    Referral Reason:   Specialty Services Required    Number of Visits Requested:   1   Informed Consent Details:  Physician/Practitioner Attestation; Transcribe to consent form and obtain patient signature    Nursing Order: Transcribe to consent form and obtain patient signature. Note: Always confirm laterality of pain with Mr. Willis, before procedure.    Physician/Practitioner attestation of informed consent for procedure/surgical case:   I, the physician/practitioner, attest that I have discussed with the patient the benefits, risks, side effects, alternatives, likelihood of achieving goals and potential problems during recovery for the procedure that I have provided informed consent.    Procedure:   Lumbar Facet Block  under fluoroscopic guidance    Physician/Practitioner performing the procedure:   Idonna Heeren A. Laban Emperor MD    Indication/Reason:   Low Back Pain, with our without leg pain, due to Facet Joint Arthralgia (Joint Pain) Spondylosis (Arthritis of the Spine), without myelopathy or radiculopathy (Nerve Damage).   Provide equipment / supplies at bedside    Procedure tray: "Block Tray" (Disposable  single use) Skin infiltration needle: Regular 1.5-in, 25-G, (x1) Block Needle type: Spinal Amount/quantity: 4  Size: Medium (5-inch) Gauge: 22G    Standing Status:   Standing    Number of Occurrences:   1    Specify:   Block Tray   Blood Thinner Instructions to Nursing    Always make sure patient has clearance from prescribing physician to stop blood thinners for interventional therapies. If the patient requires a Lovenox-bridge therapy, make sure arrangements are made to institute it with the assistance of the PCP.    Scheduling Instructions:     Have Mr. Alter stop the Plavix (Clopidogrel) x 7-10 days prior to procedure or surgery.   Saline lock IV    Have LR [phone] mL available and administer at 125 mL/hr if patient becomes hypotensive.    Standing Status:   Standing    Number of Occurrences:   1   Chronic Opioid Analgesic:   No opioid analgesics prescribed by our practice. Oxycodone/APAP  5/325 1 tab PO 4 hours (Written by Oneita Kras, PA-C) Highest recorded MME/day: 50 mg/day MME/day: 50 mg/day    Medications ordered for procedure: Meds ordered this encounter  Medications   lidocaine (XYLOCAINE) 2 % (with pres) injection 400 mg   pentafluoroprop-tetrafluoroeth (GEBAUERS) aerosol   midazolam (VERSED) 5 MG/5ML injection 0.5-2 mg    Make sure Flumazenil is available in the pyxis when using this medication. If oversedation occurs, administer 0.2 mg IV over 15 sec. If after 45 sec no response, administer 0.2 mg again over 1 min; may repeat at 1 min intervals; not to exceed 4 doses (1 mg)   fentaNYL (SUBLIMAZE) injection 25-50 mcg    Make sure Narcan is available in the pyxis when using this medication. In the event of respiratory depression (RR< 8/min): Titrate NARCAN (naloxone) in increments of 0.1 to 0.2 mg IV at 2-3 minute intervals, until desired degree of reversal.   ropivacaine (PF) 2 mg/mL (0.2%) (NAROPIN) injection 18 mL   triamcinolone acetonide (KENALOG-40) injection 80 mg   Medications administered: We administered lidocaine, pentafluoroprop-tetrafluoroeth, midazolam, fentaNYL, ropivacaine (PF) 2 mg/mL (0.2%), and triamcinolone acetonide.  See the medical record for exact dosing, route, and time of administration.  Follow-up plan:   Return in about 2 weeks (around 06/28/2023) for (Face2F), (PPE).       Interventional Therapies  Risk Factors  Considerations  Medical Comorbidities:  MO (327 lbs) (BMI>35)  T2NIDDM  Stage 5 CKD  ESRD on Dialysis  Uncontrolled HTN  Hx. Paroxysmal V-Tach  CAD  OSA  GERD     Planned  Pending:   Diagnostic/therapeutic bilateral lumbar facet (L2-S1) MBB #4 (first of 2025)    Under consideration:      Completed:   Diagnostic/therapeutic bilateral lumbar facet (L2-S1) MBB x3 (06/21/2019) (75/75/100/100)    Therapeutic  Palliative (PRN) options:   Diagnostic/palliative bilateral lumbar facet block #4     Completed by other providers:   Prior patient of Merri Ray, DO      Recent Visits Date Type Provider Dept  06/08/23 Office Visit Delano Metz, MD [facility]  Showing recent visits within past 90 days and meeting all other requirements Today's Visits Date Type Provider Dept  06/14/23 Procedure visit Delano Metz, MD [facility]  Showing today's visits and meeting all other requirements Future Appointments Date Type Provider Dept  06/28/23 Appointment Delano Metz, MD [facility]  Showing future appointments within next 90 days and meeting all other requirements  Disposition: Discharge home  Discharge (Date  Time): 06/14/2023; 0909 hrs.   Primary Care  Physician: Hillery Aldo, MD Location: [facility] Note by: Oswaldo Done, MD (TTS technology used. I apologize for any typographical errors that were not detected and corrected.) Date: 06/14/2023; Time: 10:04 AM  Disclaimer:  Medicine is not an Visual merchandiser. The only guarantee in medicine is that nothing is guaranteed. It is important to note that the decision to proceed with this intervention was based on the information collected from the patient. The Data and conclusions were drawn from the patient's questionnaire, the interview, and the physical examination. Because the information was provided in large part by the patient, it cannot be guaranteed that it has not been purposely or unconsciously manipulated. Every effort has been made to obtain as much relevant data as possible for this evaluation. It is important to note that the conclusions that lead to this procedure are derived in large part from the available data. Always take into account that the treatment will also be dependent on availability of resources and existing treatment guidelines, considered by other Pain Management Practitioners as being common knowledge and practice, at the time  of the intervention. For Medico-Legal purposes, it is also important to point out that variation in procedural techniques and pharmacological choices are the acceptable norm. The indications, contraindications, technique, and results of the above procedure should only be interpreted and judged by a Board-Certified Interventional Pain Specialist with extensive familiarity and expertise in the same exact procedure and technique.

## 2023-06-15 ENCOUNTER — Telehealth: Payer: Self-pay

## 2023-06-15 ENCOUNTER — Encounter (INDEPENDENT_AMBULATORY_CARE_PROVIDER_SITE_OTHER): Payer: Self-pay

## 2023-06-15 NOTE — Telephone Encounter (Signed)
Post procedure phone call. Patient states he is doing well.  

## 2023-06-27 NOTE — Progress Notes (Unsigned)
 PROVIDER NOTE: Information contained herein reflects review and annotations entered in association with encounter. Interpretation of such information and data should be left to medically-trained personnel. Information provided to patient can be located elsewhere in the medical record under "Patient Instructions". Document created using STT-dictation technology, any transcriptional errors that may result from process are unintentional.    Patient: Donald Fernandez  Service Category: E/M  Provider: Oswaldo Done, MD  DOB: June 06, 1970  DOS: 06/28/2023  Referring Provider: Hillery Aldo, MD  MRN: [id number]  Specialty: Interventional Pain Management  PCP: Hillery Aldo, MD  Type: Established Patient  Setting: Ambulatory outpatient    Location: Office  Delivery: Face-to-face     HPI  [name], a 53 y.o. year old male, is here today because of his Chronic bilateral low back pain without sciatica [M54.50, G89.29]. [name] primary complain today is No chief complaint on file.  Pertinent problems: [name] has Personal history of gout; Lumbar facet hypertrophy (Multilevel) (Bilateral); Lumbar foraminal stenosis (Multilevel) (Bilateral); DDD (degenerative disc disease), lumbosacral; Sacroiliitis (Bilateral); Osteoarthritis of lumbar spine; Osteoarthritis of facet joint of lumbar spine; Chronic pain syndrome; Chronic gout 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 tophus; Chronic low back pain (1ry area of Pain) (Bilateral) (L>R) w/o sciatica; Lumbar facet syndrome (Bilateral) (L>R); Chronic lower extremity pain (Bilateral) (L>R); Spondylosis without myelopathy or radiculopathy, lumbosacral region; Lumbar facet joint pain; Fixation hardware in leg; Chronic pain of right ankle; and Chronic low back pain radiating to legs (Bilateral) on their pertinent problem list. Pain Assessment: Severity of   is reported as a  /10. Location:    / . Onset:  . Quality:  . Timing:  . Modifying factor(s):  Marland Kitchen Vitals:  vitals  were not taken for this visit.  BMI: Estimated body mass index is 46.18 kg/m as calculated from the following:   Height as of 06/14/23: 5\' 10"  (1.778 m).   Weight as of 06/14/23: 321 lb 14 oz (146 kg). Last encounter: 06/08/2023. Last procedure: 06/14/2023.  Reason for encounter: post-procedure evaluation and assessment. ***  Discussed the use of AI scribe software for clinical note transcription with the patient, who gave verbal consent to proceed.  History of Present Illness          Post-procedure evaluation    Type: Lumbar Facet, Medial Branch Block(s) (w/ fluoroscopic mapping) #4  Laterality: Bilateral  Level: L2, L3, L4, L5, and S1 Medial Branch Level(s). Injecting these levels blocks the L3-4, L4-5, and L5-S1 lumbar facet joints. Imaging: Fluoroscopic guidance Spinal ([id number]) Anesthesia: Local anesthesia (1-2% Lidocaine) Anxiolysis: IV Versed         Sedation: Moderate Sedation Fentanyl         DOS: 06/14/2023 Performed by: Oswaldo Done, MD  Primary Purpose: Diagnostic/Therapeutic Indications: Low back pain severe enough to impact quality of life or function. 1. Chronic low back pain (1ry area of Pain) (Bilateral) (L>R) w/o sciatica   2. Chronic low back pain radiating to legs (Bilateral)   3. Lumbar facet joint pain   4. Lumbar facet hypertrophy (Multilevel) (Bilateral)   5. Lumbar facet syndrome (Bilateral) (L>R)   6. Spondylosis without myelopathy or radiculopathy, lumbosacral region   7. Morbid obesity with BMI of 45.0-49.9, adult (HCC)    NAS-11 Pain score:   Pre-procedure: 6 /10   Post-procedure: 3 /10     Effectiveness:  Initial hour after procedure:   ***. Subsequent 4-6 hours post-procedure:   ***. Analgesia past initial 6 hours:   ***.  Ongoing improvement:  Analgesic:  *** Function:    ***    ROM:    ***      Pharmacotherapy Assessment  Analgesic: No opioid analgesics prescribed by our practice. Oxycodone/APAP 5/325 1 tab PO 4 hours (Written by  Oneita Kras, PA-C) Highest recorded MME/day: 50 mg/day MME/day: 50 mg/day    Monitoring: [place] PMP: PDMP reviewed during this encounter.       Pharmacotherapy: No side-effects or adverse reactions reported. Compliance: No problems identified. Effectiveness: Clinically acceptable.  No notes on file  No results found for: "CBDTHCR" No results found for: "D8THCCBX" No results found for: "D9THCCBX"  UDS:  No results found for: "SUMMARY"    ROS  Constitutional: Denies any fever or chills Gastrointestinal: No reported hemesis, hematochezia, vomiting, or acute GI distress Musculoskeletal: Denies any acute onset joint swelling, redness, loss of ROM, or weakness Neurological: No reported episodes of acute onset apraxia, aphasia, dysarthria, agnosia, amnesia, paralysis, loss of coordination, or loss of consciousness  Medication Review  Testosterone, amLODipine, ammonium lactate, anastrozole, aspirin EC, calcium acetate, clindamycin, cloNIDine, clobetasol, fluticasone, gabapentin, hydrOXYzine, lidocaine, metoprolol succinate, omeprazole, pantoprazole, tamsulosin, topiramate, and traZODone  History Review  Allergy: [name] is allergic to accupril [quinapril hcl] and ace inhibitors. Drug: [name]  reports current drug use. Drug: Marijuana. Alcohol:  reports current alcohol use. Tobacco:  reports that he has been smoking cigarettes. He has never used smokeless tobacco. Social: [name]  reports that he has been smoking cigarettes. He has never used smokeless tobacco. He reports current alcohol use. He reports current drug use. Drug: Marijuana. Medical:  has a past medical history of Arthritis, Asthma, BPH (benign prostatic hyperplasia), Diabetes mellitus without complication (HCC), Dialysis patient (HCC), GERD (gastroesophageal reflux disease), Gout, Hyperlipidemia, Hypertension, Neuropathy, Renal disorder, Restless legs, and Sleep apnea. Surgical: [name]  has a past surgical  history that includes Ankle surgery (Right); AV fistula placement (Left, 08/04/2016); IR ANGIO INTRA EXTRACRAN SEL INTERNAL CAROTID UNI R MOD SED (12/17/2016); Radiology with anesthesia (N/A, 12/17/2016); DIALYSIS/PERMA CATHETER INSERTION (N/A, 06/06/2017); A/V Fistulagram (N/A, 06/08/2017); AV Fistula Insertion w/RF Magnetic Guidance (Left, 09/02/2017); Upper Extremity Angiography (Left, 10/19/2017); Revison of arteriovenous fistula (Left, 01/04/2018); and DIALYSIS/PERMA CATHETER REMOVAL (N/A, 05/04/2018). Family: family history includes AAA (abdominal aortic aneurysm) in his mother; Diabetes in his mother; Heart disease in his father.  Laboratory Chemistry Profile   Renal Lab Results  Component Value Date   BUN 52 (H) 06/10/2023   CREATININE 14.69 (H) 06/10/2023   BCR 4 (L) 11/02/2022   GFRAA 5 (L) 12/08/2019   GFRNONAA 4 (L) 06/10/2023    Hepatic Lab Results  Component Value Date   AST 14 (L) 04/15/2023   ALT 12 04/15/2023   ALBUMIN 3.6 04/15/2023   ALKPHOS 44 04/15/2023   HCVAB <0.1 06/08/2017    Electrolytes Lab Results  Component Value Date   NA 135 06/10/2023   K 4.0 06/10/2023   CL 94 (L) 06/10/2023   CALCIUM 10.1 06/10/2023   MG 2.2 02/07/2023   PHOS 5.3 (H) 06/07/2017    Bone No results found for: "VD25OH", "VD125OH2TOT", "EA5409WJ1", "BJ4782NF6", "25OHVITD1", "25OHVITD2", "25OHVITD3", "TESTOFREE", "TESTOSTERONE"  Inflammation (CRP: Acute Phase) (ESR: Chronic Phase) Lab Results  Component Value Date   LATICACIDVEN 0.7 07/09/2016         Note: Above Lab results reviewed.  Recent Imaging Review  DG PAIN CLINIC C-ARM 1-60 MIN NO REPORT Fluoro was used, but no Radiologist interpretation will be provided.  Please refer to "  NOTES" tab for provider progress note. Note: Reviewed        Physical Exam  General appearance: Well nourished, well developed, and well hydrated. In no apparent acute distress Mental status: Alert, oriented x 3 (person, place, & time)        Respiratory: No evidence of acute respiratory distress Eyes: PERLA Vitals: There were no vitals taken for this visit. BMI: Estimated body mass index is 46.18 kg/m as calculated from the following:   Height as of 06/14/23: 5\' 10"  (1.778 m).   Weight as of 06/14/23: 321 lb 14 oz (146 kg). Ideal: Ideal body weight: 73 kg (160 lb 15 oz) Adjusted ideal body weight: 102.2 kg (225 lb 5 oz)  Assessment   Diagnosis Status  1. Chronic low back pain (1ry area of Pain) (Bilateral) (L>R) w/o sciatica   2. Chronic low back pain radiating to legs (Bilateral)   3. Lumbar facet joint pain   4. Lumbar facet syndrome (Bilateral) (L>R)   5. Postop check    Controlled Controlled Controlled   Updated Problems: No problems updated.  Plan of Care  Problem-specific:  Assessment and Plan            [name] has a current medication list which includes the following long-term medication(s): fluticasone, gabapentin, metoprolol succinate, and omeprazole.  Pharmacotherapy (Medications Ordered): No orders of the defined types were placed in this encounter.  Orders:  No orders of the defined types were placed in this encounter.  Follow-up plan:   No follow-ups on file.      Interventional Therapies  Risk Factors  Considerations  Medical Comorbidities:  MO (327 lbs) (BMI>35)  T2NIDDM  Stage 5 CKD  ESRD on Dialysis  Uncontrolled HTN  Hx. Paroxysmal V-Tach  CAD  OSA  GERD     Planned  Pending:   Diagnostic/therapeutic bilateral lumbar facet (L2-S1) MBB #4 (first of 2025)    Under consideration:      Completed:   Diagnostic/therapeutic bilateral lumbar facet (L2-S1) MBB x3 (06/21/2019) (75/75/100/100)    Therapeutic  Palliative (PRN) options:   Diagnostic/palliative bilateral lumbar facet block #4    Completed by other providers:   Prior patient of Merri Ray, DO      Recent Visits Date Type Provider Dept  06/14/23 Procedure visit Delano Metz, MD  [facility]  06/08/23 Office Visit Delano Metz, MD [facility]  Showing recent visits within past 90 days and meeting all other requirements Future Appointments Date Type Provider Dept  06/28/23 Appointment Delano Metz, MD [facility]  Showing future appointments within next 90 days and meeting all other requirements  I discussed the assessment and treatment plan with the patient. The patient was provided an opportunity to ask questions and all were answered. The patient agreed with the plan and demonstrated an understanding of the instructions.  Patient advised to call back or seek an in-person evaluation if the symptoms or condition worsens.  Duration of encounter: *** minutes.  Total time on encounter, as per AMA guidelines included both the face-to-face and non-face-to-face time personally spent by the physician and/or other qualified health care professional(s) on the day of the encounter (includes time in activities that require the physician or other qualified health care professional and does not include time in activities normally performed by clinical staff). Physician's time may include the following activities when performed: Preparing to see the patient (e.g., pre-charting review of records, searching for previously ordered imaging, lab work, and  nerve conduction tests) Review of prior analgesic pharmacotherapies. Reviewing PMP Interpreting ordered tests (e.g., lab work, imaging, nerve conduction tests) Performing post-procedure evaluations, including interpretation of diagnostic procedures Obtaining and/or reviewing separately obtained history Performing a medically appropriate examination and/or evaluation Counseling and educating the patient/family/caregiver Ordering medications, tests, or procedures Referring and communicating with other health care professionals (when not separately reported) Documenting clinical information in the  electronic or other health record Independently interpreting results (not separately reported) and communicating results to the patient/ family/caregiver Care coordination (not separately reported)  Note by: Oswaldo Done, MD Date: 06/28/2023; Time: 7:37 AM

## 2023-06-28 ENCOUNTER — Encounter: Payer: Self-pay | Admitting: Pain Medicine

## 2023-06-28 ENCOUNTER — Ambulatory Visit: Attending: Pain Medicine | Admitting: Pain Medicine

## 2023-06-28 ENCOUNTER — Ambulatory Visit: Payer: 59 | Admitting: Pain Medicine

## 2023-06-28 VITALS — BP 108/94 | HR 78 | Temp 98.9°F | Resp 20 | Ht 69.0 in | Wt 317.5 lb

## 2023-06-28 DIAGNOSIS — M79605 Pain in left leg: Secondary | ICD-10-CM | POA: Diagnosis present

## 2023-06-28 DIAGNOSIS — Z09 Encounter for follow-up examination after completed treatment for conditions other than malignant neoplasm: Secondary | ICD-10-CM | POA: Diagnosis not present

## 2023-06-28 DIAGNOSIS — M5459 Other low back pain: Secondary | ICD-10-CM | POA: Diagnosis present

## 2023-06-28 DIAGNOSIS — G8929 Other chronic pain: Secondary | ICD-10-CM | POA: Insufficient documentation

## 2023-06-28 DIAGNOSIS — M79604 Pain in right leg: Secondary | ICD-10-CM | POA: Insufficient documentation

## 2023-06-28 DIAGNOSIS — M47816 Spondylosis without myelopathy or radiculopathy, lumbar region: Secondary | ICD-10-CM | POA: Diagnosis present

## 2023-06-28 DIAGNOSIS — M545 Low back pain, unspecified: Secondary | ICD-10-CM | POA: Diagnosis present

## 2023-06-28 NOTE — Progress Notes (Signed)
 Safety precautions to be maintained throughout the outpatient stay will include: orient to surroundings, keep bed in low position, maintain call bell within reach at all times, provide assistance with transfer out of bed and ambulation.

## 2023-09-27 ENCOUNTER — Other Ambulatory Visit: Payer: Self-pay | Admitting: Dermatology

## 2023-11-24 NOTE — Patient Instructions (Addendum)
 Mr. PHELIX FUDALA,  It was a pleasure to see you today! As we discussed:   Continue Mounjaro 5 mg weekly x 3 weeks, THEN increase to 7.5 mg weekly Please call [facility]'s mail order pharmacy when you're ready to fill Mounjaro 7.5 mg: [phone] Please work on decreasing processed/fried/greasy foods and incorporating more vegetables, fruits and lean protein (chicken, [place], fish).  Please work on increasing physical activity as able, short walks without feet pain and incorporating strength training/at home exercises Follow up: October 2nd at 11am for a telephone visit  Connee Ina, PharmD, CPP Ambulatory Care Generalist

## 2024-01-30 ENCOUNTER — Other Ambulatory Visit: Payer: Self-pay | Admitting: Nurse Practitioner

## 2024-01-30 DIAGNOSIS — E785 Hyperlipidemia, unspecified: Secondary | ICD-10-CM

## 2024-01-30 DIAGNOSIS — Z6841 Body Mass Index (BMI) 40.0 and over, adult: Secondary | ICD-10-CM

## 2024-01-30 DIAGNOSIS — I2510 Atherosclerotic heart disease of native coronary artery without angina pectoris: Secondary | ICD-10-CM

## 2024-01-30 DIAGNOSIS — I10 Essential (primary) hypertension: Secondary | ICD-10-CM

## 2024-01-30 DIAGNOSIS — E118 Type 2 diabetes mellitus with unspecified complications: Secondary | ICD-10-CM

## 2024-01-30 DIAGNOSIS — N185 Chronic kidney disease, stage 5: Secondary | ICD-10-CM

## 2024-01-30 DIAGNOSIS — Z01810 Encounter for preprocedural cardiovascular examination: Secondary | ICD-10-CM

## 2024-02-02 ENCOUNTER — Ambulatory Visit: Attending: Nurse Practitioner | Admitting: Nurse Practitioner

## 2024-02-02 VITALS — BP 124/96 | HR 95 | Temp 98.0°F | Resp 16 | Ht 70.0 in | Wt 317.0 lb

## 2024-02-02 DIAGNOSIS — M47816 Spondylosis without myelopathy or radiculopathy, lumbar region: Secondary | ICD-10-CM | POA: Insufficient documentation

## 2024-02-02 DIAGNOSIS — M79605 Pain in left leg: Secondary | ICD-10-CM | POA: Insufficient documentation

## 2024-02-02 DIAGNOSIS — M5459 Other low back pain: Secondary | ICD-10-CM | POA: Diagnosis present

## 2024-02-02 DIAGNOSIS — G8929 Other chronic pain: Secondary | ICD-10-CM | POA: Diagnosis present

## 2024-02-02 DIAGNOSIS — M545 Low back pain, unspecified: Secondary | ICD-10-CM | POA: Diagnosis present

## 2024-02-02 DIAGNOSIS — M79604 Pain in right leg: Secondary | ICD-10-CM | POA: Insufficient documentation

## 2024-02-02 NOTE — Patient Instructions (Signed)
 ______________________________________________________________________    Preparing for your procedure  Appointments: If you think you may not be able to keep your appointment, call 24-48 hours in advance to cancel. We need time to make it available to others.  Procedure visits are for procedures only. During your procedure appointment there will be: NO Prescription Refills*. NO medication changes or discussions*. NO discussion of disability issues*. NO unrelated pain problem evaluations*. NO evaluations to order other pain procedures*. *These will be addressed at a separate and distinct evaluation encounter on the provider's evaluation schedule and not during procedure days.  Instructions: Food intake: Avoid eating anything solid for at least 8 hours prior to your procedure. Clear liquid intake: You may take clear liquids such as water up to 2 hours prior to your procedure. (No carbonated drinks. No soda.) Transportation: Unless otherwise stated by your physician, bring a driver. (Driver cannot be a Market researcher, Pharmacist, community, or any other form of public transportation.) Morning Medicines: Except for blood thinners, take all of your other morning medications with a sip of water. Make sure to take your heart and blood pressure medicines. If your blood pressure's lower number is above 100, the case will be rescheduled. Blood thinners: Make sure to stop your blood thinners as instructed.  If you take a blood thinner, but were not instructed to stop it, call our office [phone] and ask to talk to a nurse. Not stopping a blood thinner prior to certain procedures could lead to serious complications. Diabetics on insulin : Notify the staff so that you can be scheduled 1st case in the morning. If your diabetes requires high dose insulin , take only  of your normal insulin  dose the morning of the procedure and notify the staff that you have done so. Preventing infections: Shower with an antibacterial soap the  morning of your procedure.  Build-up your immune system: Take 1000 mg of Vitamin C with every meal (3 times a day) the day prior to your procedure. Antibiotics: Inform the nursing staff if you are taking any antibiotics or if you have any conditions that may require antibiotics prior to procedures. (Example: recent joint implants)   Pregnancy: If you are pregnant make sure to notify the nursing staff. Not doing so may result in injury to the fetus, including death.  Sickness: If you have a cold, fever, or any active infections, call and cancel or reschedule your procedure. Receiving steroids while having an infection may result in complications. Arrival: You must be in the facility at least 30 minutes prior to your scheduled procedure. Tardiness: Your scheduled time is also the cutoff time. If you do not arrive at least 15 minutes prior to your procedure, you will be rescheduled.  Children: Do not bring any children with you. Make arrangements to keep them home. Dress appropriately: There is always a possibility that your clothing may get soiled. Avoid long dresses. Valuables: Do not bring any jewelry or valuables.  Reasons to call and reschedule or cancel your procedure: (Following these recommendations will minimize the risk of a serious complication.) Surgeries: Avoid having procedures within 2 weeks of any surgery. (Avoid for 2 weeks before or after any surgery). Flu Shots: Avoid having procedures within 2 weeks of a flu shots or . (Avoid for 2 weeks before or after immunizations). Barium: Avoid having a procedure within 7-10 days after having had a radiological study involving the use of radiological contrast. (Myelograms, Barium swallow or enema study). Heart attacks: Avoid any elective procedures or surgeries for the  initial 6 months after a Myocardial Infarction (Heart Attack). Blood thinners: It is imperative that you stop these medications before procedures. Let us  know if you if you take  any blood thinner.  Infection: Avoid procedures during or within two weeks of an infection (including chest colds or gastrointestinal problems). Symptoms associated with infections include: Localized redness, fever, chills, night sweats or profuse sweating, burning sensation when voiding, cough, congestion, stuffiness, runny nose, sore throat, diarrhea, nausea, vomiting, cold or Flu symptoms, recent or current infections. It is specially important if the infection is over the area that we intend to treat. Heart and lung problems: Symptoms that may suggest an active cardiopulmonary problem include: cough, chest pain, breathing difficulties or shortness of breath, dizziness, ankle swelling, uncontrolled high or unusually low blood pressure, and/or palpitations. If you are experiencing any of these symptoms, cancel your procedure and contact your primary care physician for an evaluation.  Remember:  Regular Business hours are:  Monday to Thursday 8:00 AM to 4:00 PM  Provider's Schedule: Eric Como, MD:  Procedure days: Tuesday and Thursday 7:30 AM to 4:00 PM  Wallie Sherry, MD:  Procedure days: Monday and Wednesday 7:30 AM to 4:00 PM Last  Updated: 03/22/2023 ______________________________________________________________________     ______________________________________________________________________    Blood Thinners  IMPORTANT NOTICE:  If you take any of these, make sure to notify the nursing staff.  Failure to do so may result in serious injury.  Recommended time intervals to stop and restart blood-thinners, before & after invasive procedures  Generic Name Brand Name Pre-procedure: Stop medication for this amount of time before your procedure: Post-procedure: Wait this amount of time after the procedure before restarting your medication:  Abciximab Reopro 15 days 2 hrs  Alteplase  Activase  10 days 10 days  Anagrelide Agrylin    Apixaban Eliquis 3 days 6 hrs  Cilostazol Pletal  3 days 5 hrs  Clopidogrel  Plavix  7-10 days 2 hrs  Dabigatran Pradaxa 5 days 6 hrs  Dalteparin Fragmin 24 hours 4 hrs  Dipyridamole Aggrenox 11days 2 hrs  Edoxaban Lixiana; Savaysa 3 days 2 hrs  Enoxaparin  Lovenox 24 hours 4 hrs  Eptifibatide Integrillin 8 hours 2 hrs  Fondaparinux  Arixtra 72 hours 12 hrs  Hydroxychloroquine Plaquenil 11 days   Prasugrel Effient 7-10 days 6 hrs  Reteplase Retavase 10 days 10 days  Rivaroxaban Xarelto 3 days 6 hrs  Ticagrelor Brilinta 5-7 days 6 hrs  Ticlopidine Ticlid 10-14 days 2 hrs  Tinzaparin Innohep 24 hours 4 hrs  Tirofiban Aggrastat 8 hours 2 hrs  Warfarin Coumadin 5 days 2 hrs   Other medications with blood-thinning effects  NOTE: Consider stopping these if you have prolonged bleeding despite not taking any of the above blood thinners. Otherwise ask your provider and this will be decided on a case-by-case basis.  Product indications Generic (Brand) names Note  Cholesterol Lipitor Stop 4 days before procedure  Blood thinner (injectable) Heparin  (LMW or LMWH Heparin ) Stop 24 hours before procedure  Cancer Ibrutinib (Imbruvica) Stop 7 days before procedure  Malaria/Rheumatoid Hydroxychloroquine (Plaquenil) Stop 11 days before procedure  Thrombolytics  10 days before or after procedures   Over-the-counter (OTC) Products with blood-thinning effects  NOTE: Consider stopping these if you have prolonged bleeding despite not taking any of the above blood thinners. Otherwise ask your provider and this will be decided on a case-by-case basis.  Product Common names Stop Time  Aspirin  > 325 mg Goody Powders, Excedrin, etc. 11 days  Aspirin  <= 81  mg  7 days  Fish oil  4 days  Garlic supplements  7 days  Ginkgo biloba  36 hours  Ginseng  24 hours  NSAIDs Ibuprofen , Naprosyn, etc. 3 days  Vitamin E   4 days   ______________________________________________________________________

## 2024-02-02 NOTE — Progress Notes (Signed)
 PROVIDER NOTE: Interpretation of information contained herein should be left to medically-trained personnel. Specific patient instructions are provided elsewhere under Patient Instructions section of medical record. This document was created in part using AI and STT-dictation technology, any transcriptional errors that may result from this process are unintentional.  Patient: Donald Fernandez  Service: E/M   PCP: Fernandez Domino, MD  DOB: 04/28/1970  DOS: 02/02/2024  Provider: Emmy MARLA Tobie, Fernandez  MRN: [id number]  Delivery: Face-to-face  Specialty: Interventional Pain Management  Type: Established Patient  Setting: Ambulatory outpatient facility  Specialty designation: 09  Referring Prov.: Fernandez Domino, MD  Location: Outpatient office facility       History of present illness (HPI) Donald Fernandez, a 53 y.o. year old male, is here today because of his Lumbar facet joint pain [M54.59]. Donald Fernandez primary complain today is Hip Pain (bilateral)  Pertinent problems: Donald Fernandez has Personal history of gout; Lumbar facet hypertrophy (Multilevel) (Bilateral); Lumbar foraminal stenosis (Multilevel) (Bilateral); DDD (degenerative disc disease), lumbosacral; Sacroiliitis (Bilateral); Osteoarthritis of lumbar spine; Osteoarthritis of facet joint of lumbar spine; Chronic pain syndrome; Chronic gout 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 tophus; Chronic low back pain (1ry area of Pain) (Bilateral) (L>R) w/o sciatica; Lumbar facet syndrome (Bilateral) (L>R); Chronic lower extremity pain (Bilateral) (L>R); Spondylosis without myelopathy or radiculopathy, lumbosacral region; Lumbar facet joint pain; Fixation hardware in leg; Chronic pain of right ankle; and Chronic low back pain radiating to legs (Bilateral) on their pertinent problem list.  Pain Assessment: Severity of Chronic pain is reported as a 7 /10. Location: Hip Right, Left, Mid/states pain starts in buttocks /hips and radiates up to mid back. Onset: More than a month  ago. Quality: Aching, Constant, Tiring, Sharp, Moaning, Nagging, Discomfort, Squeezing, Throbbing, Spasm. Timing: Constant. Modifying factor(s): Nothin   only thing that works for me is a shot or a pill. Vitals:  height is 5' 10 (1.778 m) and weight is 317 lb (143.8 kg) (abnormal). His temperature is 98 F (36.7 C). His blood pressure is 124/96 (abnormal) and his pulse is 95. His respiration is 16 and oxygen saturation is 97%.  BMI: Estimated body mass index is 45.48 kg/m as calculated from the following:   Height as of this encounter: 5' 10 (1.778 m).   Weight as of this encounter: 317 lb (143.8 kg).  Last encounter: Visit date not found. Last procedure: Visit date not found.  Reason for encounter: evaluation for possible interventional PM therapy/treatment.   Discussed the use of AI scribe software for clinical note transcription with the patient, who gave verbal consent to proceed.  History of Present Illness   Donald Fernandez is a 53 year old male who presents with persistent hip and low back pain.  He has been experiencing ongoing hip and low back pain since earlier this year. The pain originates in the lower back and radiates to the buttocks, becoming particularly noticeable when lying in bed. He describes the sensation as his 'butt cheeks be doing it'.  He previously received a lumbar facet block in March or April, which provided some relief, but the pain has since returned.  His current medications include 81 mg aspirin . He was taken off blood pressure medications and metoprolol  due to his status on the kidney transplant list. He is not currently on any anticoagulants.  He attends dialysis sessions on Monday, Wednesday, and Friday, which impacts his availability for scheduling procedures.     Pharmacotherapy Assessment   Monitoring: [place] PMP: PDMP  not reviewed this encounter.       Pharmacotherapy: No side-effects or adverse reactions reported. Compliance: No problems  identified. Effectiveness: Clinically acceptable.  Donald Montie FALCON, RN  02/02/2024  2:39 PM  Sign when Signing Visit Safety precautions to be maintained throughout the outpatient stay will include: orient to surroundings, keep bed in low position, maintain call bell within reach at all times, provide assistance with transfer out of bed and ambulation.     UDS:  No results found for: SUMMARY  No results found for: CBDTHCR No results found for: D8THCCBX No results found for: D9THCCBX  ROS  Constitutional: Denies any fever or chills Gastrointestinal: No reported hemesis, hematochezia, vomiting, or acute GI distress Musculoskeletal: Bilateral low back pain radiating to hip Neurological: No reported episodes of acute onset apraxia, aphasia, dysarthria, agnosia, amnesia, paralysis, loss of coordination, or loss of consciousness  Medication Review  Testosterone , amLODipine , ammonium lactate, anastrozole , aspirin  EC, calcium  acetate, clindamycin , cloNIDine , clobetasol , fluticasone , gabapentin , hydrOXYzine, lidocaine , metoprolol  succinate, omeprazole, pantoprazole , tamsulosin , topiramate , and traZODone   History Review  Allergy: Donald Fernandez is allergic to accupril [quinapril hcl] and ace inhibitors. Drug: Donald Fernandez  reports current drug use. Drug: Marijuana. Alcohol:  reports current alcohol use. Tobacco:  reports that he has been smoking cigarettes. He has never used smokeless tobacco. Social: Donald Fernandez  reports that he has been smoking cigarettes. He has never used smokeless tobacco. He reports current alcohol use. He reports current drug use. Drug: Marijuana. Medical:  has a past medical history of Arthritis, Asthma, BPH (benign prostatic hyperplasia), Diabetes mellitus without complication (HCC), Dialysis patient, GERD (gastroesophageal reflux disease), Gout, Hyperlipidemia, Hypertension, Neuropathy, Renal disorder, Restless legs, and Sleep apnea. Surgical: Donald Fernandez  has a past  surgical history that includes Ankle surgery (Right); AV fistula placement (Left, 08/04/2016); IR ANGIO INTRA EXTRACRAN SEL INTERNAL CAROTID UNI R MOD SED (12/17/2016); Radiology with anesthesia (N/A, 12/17/2016); DIALYSIS/PERMA CATHETER INSERTION (N/A, 06/06/2017); A/V Fistulagram (N/A, 06/08/2017); AV Fistula Insertion w/RF Magnetic Guidance (Left, 09/02/2017); Upper Extremity Angiography (Left, 10/19/2017); Revison of arteriovenous fistula (Left, 01/04/2018); and DIALYSIS/PERMA CATHETER REMOVAL (N/A, 05/04/2018). Family: family history includes AAA (abdominal aortic aneurysm) in his mother; Diabetes in his mother; Heart disease in his father.  Laboratory Chemistry Profile   Renal Lab Results  Component Value Date   BUN 52 (H) 06/10/2023   CREATININE 14.69 (H) 06/10/2023   BCR 4 (L) 11/02/2022   GFRAA 5 (L) 12/08/2019   GFRNONAA 4 (L) 06/10/2023    Hepatic Lab Results  Component Value Date   AST 14 (L) 04/15/2023   ALT 12 04/15/2023   ALBUMIN  3.6 04/15/2023   ALKPHOS 44 04/15/2023   HCVAB <0.1 06/08/2017    Electrolytes Lab Results  Component Value Date   NA 135 06/10/2023   K 4.0 06/10/2023   CL 94 (L) 06/10/2023   CALCIUM  10.1 06/10/2023   MG 2.2 02/07/2023   PHOS 5.3 (H) 06/07/2017    Bone No results found for: VD25OH, [id number], CI6874NY7, CI7874NY7, 25OHVITD1, 25OHVITD2, 25OHVITD3, TESTOFREE, TESTOSTERONE   Inflammation (CRP: Acute Phase) (ESR: Chronic Phase) Lab Results  Component Value Date   LATICACIDVEN 0.7 07/09/2016         Note: Above Lab results reviewed.  Recent Imaging Review  DG PAIN [facility] C-ARM 1-60 MIN NO REPORT Fluoro was used, but no Radiologist interpretation will be provided.  Please refer to NOTES tab for provider progress note. Note: Reviewed        Physical Exam  Vitals: BP (!) 124/96  Pulse 95   Temp 98 F (36.7 C)   Resp 16   Ht 5' 10 (1.778 m)   Wt (!) 317 lb (143.8 kg)   SpO2 97%   BMI 45.48 kg/m  BMI: Estimated  body mass index is 45.48 kg/m as calculated from the following:   Height as of this encounter: 5' 10 (1.778 m).   Weight as of this encounter: 317 lb (143.8 kg). Ideal: Ideal body weight: 73 kg (160 lb 15 oz) Adjusted ideal body weight: 101.3 kg (223 lb 5.8 oz) General appearance: Well nourished, well developed, and well hydrated. In no apparent acute distress Mental status: Alert, oriented x 3 (person, place, & time)       Respiratory: No evidence of acute respiratory distress Eyes: PERLA  Musculoskeletal: + Bilateral low back pain radiating to hip  Lumbar Exam  Skin & Axial Inspection: No masses, redness, or swelling Alignment: Symmetrical Functional ROM: Pain restricted ROM affecting both sides Stability: No instability detected Muscle Tone/Strength: Functionally intact. No obvious neuro-muscular anomalies detected. Sensory (Neurological): Musculoskeletal pain pattern Palpation: No palpable anomalies       Provocative Tests: Hyperextension/rotation test: (+) bilaterally for facet joint pain. Lumbar quadrant test (Kemp's test): (+) bilaterally for facet joint pain. Lateral bending test: (+) due to pain.  *(Flexion, ABduction and External Rotation) Assessment   Diagnosis Status  1. Lumbar facet joint pain   2. Chronic low back pain (1ry area of Pain) (Bilateral) (L>R) w/o sciatica   3. Chronic low back pain radiating to legs (Bilateral)   4. Lumbar facet syndrome (Bilateral) (L>R)   5. Lumbar facet hypertrophy (Multilevel) (Bilateral)    Having a Flare-up Having a Flare-up Having a Flare-up   Updated Problems: No problems updated.  Plan of Care  Problem-specific:  Assessment and Plan    Chronic low back pain with bilateral hip pain Chronic low back pain with bilateral hip pain, previously treated with lumbar facet block. Symptoms persist, affecting sleep.  The patient continues experiencing low back pain with bilateral referred pain to the buttocks more severe on  right side than the left side.  The pain is consistent and having a flareup which effectively managed with bilateral lumbar facet block in the past.  He denied numbness and weakness in his leg and he is not on any anticoagulants.  We discussed to repeat the lumbar facet block with Dr. Tanya.  - Schedule lumbar facet block with Dr. Naveira within two weeks, accommodating dialysis schedule on Monday, Wednesday, and Friday. - Coordinate procedure scheduling for Tuesday or Thursday as preferred.  Plan: (ECT): (B) L-FCT (MBNB) # 5 with Dr. Naveira (IV versed ), (Blood thinner protocol)    Donald Fernandez has a current medication list which includes the following long-term medication(s): fluticasone , gabapentin , metoprolol  succinate, and omeprazole.  Pharmacotherapy (Medications Ordered): No orders of the defined types were placed in this encounter.  Orders:  Orders Placed This Encounter  Procedures   LUMBAR FACET(MEDIAL BRANCH NERVE BLOCK) MBNB    Diagnosis: Lumbar Facet Syndrome (M47.816); Lumbosacral Facet Syndrome (M47.817); Lumbar Facet Joint Pain (M54.59) Medical Necessity Statement: 1.Severe chronic axial low back pain causing functional impairment documented by ongoing pain scale assessments. 2.Pain present for longer than 3 months (Chronic) documented to have failed noninvasive conservative therapies. 3.Absence of untreated radiculopathy. 4.There is no radiological evidence of untreated fractures, tumor, infection, or deformity.  Physical Examination Findings: Positive Kemp Maneuver: (Y)  Positive Lumbar Hyperextension-Rotation provocative test: (Y)    Standing Status:  Future    Expiration Date:   05/04/2024    Scheduling Instructions:     Procedure: Lumbar facet Block     Type: Medial Branch Block     Side: Bilateral     Purpose: Diagnostic/Therapeutic     Level(s): L3-4, L4-5, and L5-S1 Facets (L3, L4, L5, and S1 Medial Branch)     Sedation: With Sedation. (IV versed )      Timeframe: As soon as schedule allows.    Where will this procedure be performed?:   [facility]        Return in about 2 weeks (around 02/16/2024) for (ECT): (B) L-FCT (MBNB) # 5 with Dr. Naveira (IV versed ), (Blood thinner protocol).    Recent Visits No visits were found meeting these conditions. Showing recent visits within past 90 days and meeting all other requirements Today's Visits Date Type Provider Dept  02/02/24 Office Visit Lanett Lasorsa K, Fernandez [facility]  Showing today's visits and meeting all other requirements Future Appointments Date Type Provider Dept  02/16/24 Appointment Tanya Glisson, MD [facility]  Showing future appointments within next 90 days and meeting all other requirements  I discussed the assessment and treatment plan with the patient. The patient was provided an opportunity to ask questions and all were answered. The patient agreed with the plan and demonstrated an understanding of the instructions.  Patient advised to call back or seek an in-person evaluation if the symptoms or condition worsens.  I personally spent a total of 30 minutes in the care of the patient today including preparing to see the patient, getting/reviewing separately obtained history, performing a medically appropriate exam/evaluation, counseling and educating, placing orders, referring and communicating with other health care professionals, documenting clinical information in the EHR, independently interpreting results, communicating results, and coordinating care.   Note by: Clista Rainford K Zackariah Vanderpol, Fernandez (TTS and AI technology used. I apologize for any typographical errors that were not detected and corrected.) Date: 02/02/2024; Time: 4:00 PM

## 2024-02-02 NOTE — Progress Notes (Signed)
 Safety precautions to be maintained throughout the outpatient stay will include: orient to surroundings, keep bed in low position, maintain call bell within reach at all times, provide assistance with transfer out of bed and ambulation.

## 2024-02-16 ENCOUNTER — Ambulatory Visit
Admission: RE | Admit: 2024-02-16 | Discharge: 2024-02-16 | Disposition: A | Discharge status: Home / Self Care | Source: Ambulatory Visit | Attending: Pain Medicine | Admitting: Pain Medicine

## 2024-02-16 ENCOUNTER — Ambulatory Visit (HOSPITAL_BASED_OUTPATIENT_CLINIC_OR_DEPARTMENT_OTHER): Admitting: Pain Medicine

## 2024-02-16 ENCOUNTER — Encounter: Payer: Self-pay | Admitting: Pain Medicine

## 2024-02-16 VITALS — BP 134/94 | HR 105 | Temp 97.7°F | Resp 20 | Ht 70.0 in | Wt 313.1 lb

## 2024-02-16 DIAGNOSIS — M79605 Pain in left leg: Secondary | ICD-10-CM | POA: Insufficient documentation

## 2024-02-16 DIAGNOSIS — M545 Low back pain, unspecified: Secondary | ICD-10-CM | POA: Diagnosis present

## 2024-02-16 DIAGNOSIS — M5459 Other low back pain: Secondary | ICD-10-CM | POA: Diagnosis present

## 2024-02-16 DIAGNOSIS — M79604 Pain in right leg: Secondary | ICD-10-CM | POA: Insufficient documentation

## 2024-02-16 DIAGNOSIS — M47816 Spondylosis without myelopathy or radiculopathy, lumbar region: Secondary | ICD-10-CM

## 2024-02-16 DIAGNOSIS — M47817 Spondylosis without myelopathy or radiculopathy, lumbosacral region: Secondary | ICD-10-CM

## 2024-02-16 DIAGNOSIS — G8929 Other chronic pain: Secondary | ICD-10-CM

## 2024-02-16 MED ORDER — FENTANYL CITRATE (PF) 100 MCG/2ML IJ SOLN: 25.0000 ug | INTRAMUSCULAR | Status: DC | PRN

## 2024-02-16 MED ORDER — MIDAZOLAM HCL 5 MG/5ML IJ SOLN
INTRAMUSCULAR | Status: AC
  Filled 2024-02-16: qty 5

## 2024-02-16 MED ORDER — ROPIVACAINE HCL 2 MG/ML IJ SOLN
18.0000 mL | Freq: Once | INTRAMUSCULAR | Status: AC
  Administered 2024-02-16: 18 mL via PERINEURAL

## 2024-02-16 MED ORDER — ROPIVACAINE HCL 2 MG/ML IJ SOLN
INTRAMUSCULAR | Status: AC
  Filled 2024-02-16: qty 20

## 2024-02-16 MED ORDER — MIDAZOLAM HCL 5 MG/5ML IJ SOLN
0.5000 mg | Freq: Once | INTRAMUSCULAR | Status: AC
  Administered 2024-02-16: 2 mg via INTRAMUSCULAR

## 2024-02-16 MED ORDER — MIDAZOLAM HCL 5 MG/5ML IJ SOLN: 0.5000 mg | Freq: Once | INTRAMUSCULAR | Status: DC

## 2024-02-16 MED ORDER — LIDOCAINE HCL 2 % IJ SOLN
20.0000 mL | Freq: Once | INTRAMUSCULAR | Status: AC
  Administered 2024-02-16: 400 mg

## 2024-02-16 MED ORDER — TRIAMCINOLONE ACETONIDE 40 MG/ML IJ SUSP
INTRAMUSCULAR | Status: AC
  Filled 2024-02-16: qty 1

## 2024-02-16 MED ORDER — TRIAMCINOLONE ACETONIDE 40 MG/ML IJ SUSP
80.0000 mg | Freq: Once | INTRAMUSCULAR | Status: AC
  Administered 2024-02-16: 80 mg

## 2024-02-16 MED ORDER — PENTAFLUOROPROP-TETRAFLUOROETH EX AERO: INHALATION_SPRAY | Freq: Once | CUTANEOUS | Status: DC

## 2024-02-16 MED ORDER — LIDOCAINE HCL 2 % IJ SOLN
INTRAMUSCULAR | Status: AC
  Filled 2024-02-16: qty 20

## 2024-02-16 NOTE — Patient Instructions (Signed)
 ______________________________________________________________________    Post-Procedure Discharge Instructions  INSTRUCTIONS Apply ice:  Purpose: This will minimize any swelling and discomfort after procedure.  When: Day of procedure, as soon as you get home. How: Fill a plastic sandwich bag with crushed ice. Cover it with a small towel and apply to injection site. How long: (15 min on, 15 min off) Apply for 15 minutes then remove x 15 minutes.  Repeat sequence on day of procedure, until you go to bed. Apply heat:  Purpose: To treat any soreness and discomfort from the procedure. When: Starting the next day after the procedure. How: Apply heat to procedure site starting the day following the procedure. How long: May continue to repeat daily, until discomfort goes away. Food intake: Start with clear liquids (like water) and advance to regular food, as tolerated.  Physical activities: Keep activities to a minimum for the first 8 hours after the procedure. After that, then as tolerated. Driving: If you have received any sedation, be responsible and do not drive. You are not allowed to drive for 24 hours after having sedation. Blood thinner: (Applies only to those taking blood thinners) You may restart your blood thinner 6 hours after your procedure. Insulin: (Applies only to Diabetic patients taking insulin) As soon as you can eat, you may resume your normal dosing schedule. Infection prevention: Keep procedure site clean and dry. Shower daily and clean area with soap and water.  PAIN DIARY Post-procedure Pain Diary: Extremely important that this be done correctly and accurately. Recorded information will be used to determine the next step in treatment. For the purpose of accuracy, follow these rules: Evaluate only the area treated. Do not report or include pain from an untreated area. For the purpose of this evaluation, ignore all other areas of pain, except for the treated area. After your  procedure, avoid taking a long nap and attempting to complete the pain diary after you wake up. Instead, set your alarm clock to go off every hour, on the hour, for the initial 8 hours after the procedure. Document the duration of the numbing medicine, and the relief you are getting from it. Do not go to sleep and attempt to complete it later. It will not be accurate. If you received sedation, it is likely that you were given a medication that may cause amnesia. Because of this, completing the diary at a later time may cause the information to be inaccurate. This information is needed to plan your care. Follow-up appointment: Keep your post-procedure follow-up evaluation appointment after the procedure (usually 2 weeks for most procedures, 6 weeks for radiofrequencies). DO NOT FORGET to bring you pain diary with you.   EXPECT... (What should I expect to see with my procedure?) From numbing medicine (AKA: Local Anesthetics): Numbness or decrease in pain. You may also experience some weakness, which if present, could last for the duration of the local anesthetic. Onset: Full effect within 15 minutes of injected. Duration: It will depend on the type of local anesthetic used. On the average, 1 to 8 hours.  From steroids (Applies only if steroids were used): Decrease in swelling or inflammation. Once inflammation is improved, relief of the pain will follow. Onset of benefits: Depends on the amount of swelling present. The more swelling, the longer it will take for the benefits to be seen. In some cases, up to 10 days. Duration: Steroids will stay in the system x 2 weeks. Duration of benefits will depend on multiple posibilities including persistent irritating  factors. Side-effects: If present, they may typically last 2 weeks (the duration of the steroids). Frequent: Cramps (if they occur, drink Gatorade and take over-the-counter Magnesium 450-500 mg once to twice a day); water retention with temporary weight  gain; increases in blood sugar; decreased immune system response; increased appetite. Occasional: Facial flushing (red, warm cheeks); mood swings; menstrual changes. Uncommon: Long-term decrease or suppression of natural hormones; bone thinning. (These are more common with higher doses or more frequent use. This is why we prefer that our patients avoid having any injection therapies in other practices.)  Very Rare: Severe mood changes; psychosis; aseptic necrosis. From procedure: Some discomfort is to be expected once the numbing medicine wears off. This should be minimal if ice and heat are applied as instructed.  CALL IF... (When should I call?) You experience numbness and weakness that gets worse with time, as opposed to wearing off. New onset bowel or bladder incontinence. (Applies only to procedures done in the spine)  Emergency Numbers: Durning business hours (Monday - Thursday, 8:00 AM - 4:00 PM) (Friday, 9:00 AM - 12:00 Noon): [phone] After hours: [phone] NOTE: If you are having a problem and are unable connect with, or to talk to a provider, then go to your nearest urgent care or emergency department. If the problem is serious and urgent, please call 911. ______________________________________________________________________     ______________________________________________________________________    Steroid injections  Common steroids for injections Triamcinolone : Used by many sports medicine physicians for large joint and bursal injections, often combined with a local anesthetic like lidocaine . A study focusing on coccydynia (tailbone pain) found triamcinolone  was more effective than betamethasone, suggesting it may also be preferable for other localized inflammation conditions. Methylprednisolone: A common alternative to triamcinolone  that is also a strong anti-inflammatory. It is available in different formulations, with the acetate suspension being the long-acting  option for intra-articular injections. Dexamethasone : This is a non-particulate steroid, meaning it has a lower risk of tissue damage compared to particulate steroids like triamcinolone  and methylprednisolone. While less common for this specific use, it is an option for targeted injections.   Considerations for physicians Particulate vs. non-particulate steroids: Triamcinolone  and methylprednisolone are particulate, meaning they can clump together. Dexamethasone  is non-particulate. Particulate steroids are often preferred for their longer-lasting effects but carry a theoretical higher risk for certain injections (though this is less of a concern in the costochondral joints). Combined injectate: Corticosteroids are typically mixed with a local anesthetic like lidocaine  to provide both immediate pain relief (from the anesthetic) and longer-term inflammation reduction (from the steroid). Imaging guidance: To ensure accurate placement of the needle and medication, physicians may use ultrasound or fluoroscopic guidance for the injection, especially in complex or refractory cases.   Patient guidance Before undergoing a steroid injection, discuss the options with your physician. They will determine the best steroid, dosage, and procedure for your specific case based on factors like: Severity of your condition History of response to other treatments Your overall health status Experience and preference of the physician  Last  Updated: 12/06/2023 ______________________________________________________________________

## 2024-02-16 NOTE — Progress Notes (Signed)
 PROVIDER NOTE: Interpretation of information contained herein should be left to medically-trained personnel. Specific patient instructions are provided elsewhere under Patient Instructions section of medical record. This document was created in part using STT-dictation technology, any transcriptional errors that may result from this process are unintentional.  Patient: Donald Fernandez Type: Established DOB: 11-19-1970 MRN: [id number] PCP: Tobie Domino, MD  Service: Procedure DOS: 02/16/2024 Setting: Ambulatory Location: Ambulatory outpatient facility Delivery: Face-to-face Provider: Eric DELENA Como, MD Specialty: Interventional Pain Management Specialty designation: 09 Location: Outpatient facility Ref. Prov.: Tobie Domino, MD       Interventional Therapy   Type: Lumbar Facet, Medial Branch Block(s)   #5  Laterality: Bilateral  Level: L2, L3, L4, L5, and S1 Medial Branch/Dorsal Rami Level(s). Injecting these levels blocks the L3-4, L4-5, and L5-S1 lumbar facet joints. Imaging: Fluoroscopic guidance Spinal ([id number]) Anesthesia: Local anesthesia (1-2% Lidocaine ) Anxiolysis: IM Versed  2.0 mg Sedation: No Sedation                       Difficult IV stick. IV lost in transit.  DOS: 02/16/2024 Performed by: Eric DELENA Como, MD  Primary Purpose: Diagnostic/Therapeutic Indications: Low back pain severe enough to impact quality of life or function. 1. Chronic low back pain (1ry area of Pain) (Bilateral) (L>R) w/o sciatica   2. Lumbar facet joint pain   3. Lumbar facet hypertrophy (Multilevel) (Bilateral)   4. Lumbar facet syndrome (Bilateral) (L>R)   5. Osteoarthritis of facet joint of lumbar spine   6. Osteoarthritis of lumbar spine   7. Spondylosis without myelopathy or radiculopathy, lumbosacral region   8. Chronic low back pain radiating to legs (Bilateral)   9. Chronic lower extremity pain (Bilateral) (L>R)    NAS-11 Pain score:   Pre-procedure: 7 /10   Post-procedure:  0-No pain/10     Position / Prep / Materials:  Position: Prone  Prep solution: ChloraPrep (2% chlorhexidine  gluconate and 70% isopropyl alcohol) Area Prepped: Posterolateral Lumbosacral Spine (Wide prep: From the lower border of the scapula down to the end of the tailbone and from flank to flank.)  Materials:  Tray: Block Needle(s):  Type: Spinal  Gauge (G): 22  Length: 5-in Qty: 4     H&P (Pre-op  Assessment):  Mr. Schnackenberg is a 53 y.o. (year old), male patient, seen today for interventional treatment. He  has a past surgical history that includes Ankle surgery (Right); AV fistula placement (Left, 08/04/2016); IR ANGIO INTRA EXTRACRAN SEL INTERNAL CAROTID UNI R MOD SED (12/17/2016); Radiology with anesthesia (N/A, 12/17/2016); DIALYSIS/PERMA CATHETER INSERTION (N/A, 06/06/2017); A/V Fistulagram (N/A, 06/08/2017); AV Fistula Insertion w/RF Magnetic Guidance (Left, 09/02/2017); Upper Extremity Angiography (Left, 10/19/2017); Revison of arteriovenous fistula (Left, 01/04/2018); and DIALYSIS/PERMA CATHETER REMOVAL (N/A, 05/04/2018). Mr. Oldaker has a current medication list which includes the following prescription(s): ammonium lactate, anastrozole , aspirin  ec, calcium  acetate, clindamycin , clobetasol , fluticasone , gabapentin , hydroxyzine, omeprazole, pantoprazole , tamsulosin , testosterone , topiramate , trazodone , amlodipine , clonidine , lidocaine , and metoprolol  succinate, and the following Facility-Administered Medications: pentafluoroprop-tetrafluoroeth. His primarily concern today is the Back Pain (lower)  Initial Vital Signs:  Pulse/HCG Rate: (!) 105ECG Heart Rate: 91 (nsr) Temp: 97.7 F (36.5 C) Resp: 18 BP: 128/78 SpO2: 100 %  BMI: Estimated body mass index is 44.92 kg/m as calculated from the following:   Height as of this encounter: 5' 10 (1.778 m).   Weight as of this encounter: 313 lb 0.9 oz (142 kg).  Risk Assessment: Allergies: Reviewed. He is allergic to accupril [quinapril hcl] and ace  inhibitors.  Allergy Precautions: None required Coagulopathies: Reviewed. None identified.  Blood-thinner therapy: None at this time Active Infection(s): Reviewed. None identified. Mr. Lipkin is afebrile  Site Confirmation: Mr. Aden was asked to confirm the procedure and laterality before marking the site Procedure checklist: Completed Consent: Before the procedure and under the influence of no sedative(s), amnesic(s), or anxiolytics, the patient was informed of the treatment options, risks and possible complications. To fulfill our ethical and legal obligations, as recommended by the American Medical Association's Code of Ethics, I have informed the patient of my clinical impression; the nature and purpose of the treatment or procedure; the risks, benefits, and possible complications of the intervention; the alternatives, including doing nothing; the risk(s) and benefit(s) of the alternative treatment(s) or procedure(s); and the risk(s) and benefit(s) of doing nothing. The patient was provided information about the general risks and possible complications associated with the procedure. These may include, but are not limited to: failure to achieve desired goals, infection, bleeding, organ or nerve damage, allergic reactions, paralysis, and death. In addition, the patient was informed of those risks and complications associated to Spine-related procedures, such as failure to decrease pain; infection (i.e.: Meningitis, epidural or intraspinal abscess); bleeding (i.e.: epidural hematoma, subarachnoid hemorrhage, or any other type of intraspinal or peri-dural bleeding); organ or nerve damage (i.e.: Any type of peripheral nerve, nerve root, or spinal cord injury) with subsequent damage to sensory, motor, and/or autonomic systems, resulting in permanent pain, numbness, and/or weakness of one or several areas of the body; allergic reactions; (i.e.: anaphylactic reaction); and/or death. Furthermore, the patient  was informed of those risks and complications associated with the medications. These include, but are not limited to: allergic reactions (i.e.: anaphylactic or anaphylactoid reaction(s)); adrenal axis suppression; blood sugar elevation that in diabetics may result in ketoacidosis or comma; water retention that in patients with history of congestive heart failure may result in shortness of breath, pulmonary edema, and decompensation with resultant heart failure; weight gain; swelling or edema; medication-induced neural toxicity; particulate matter embolism and blood vessel occlusion with resultant organ, and/or nervous system infarction; and/or aseptic necrosis of one or more joints. Finally, the patient was informed that Medicine is not an exact science; therefore, there is also the possibility of unforeseen or unpredictable risks and/or possible complications that may result in a catastrophic outcome. The patient indicated having understood very clearly. We have given the patient no guarantees and we have made no promises. Enough time was given to the patient to ask questions, all of which were answered to the patient's satisfaction. Mr. Want has indicated that he wanted to continue with the procedure. Attestation: I, the ordering provider, attest that I have discussed with the patient the benefits, risks, side-effects, alternatives, likelihood of achieving goals, and potential problems during recovery for the procedure that I have provided informed consent. Date  Time: 02/16/2024  9:40 AM  Pre-Procedure Preparation:  Monitoring: As per clinic protocol. Respiration, ETCO2, SpO2, BP, heart rate and rhythm monitor placed and checked for adequate function Safety Precautions: Patient was assessed for positional comfort and pressure points before starting the procedure. Time-out: I initiated and conducted the Time-out before starting the procedure, as per protocol. The patient was asked to participate by  confirming the accuracy of the Time Out information. Verification of the correct person, site, and procedure were performed and confirmed by me, the nursing staff, and the patient. Time-out conducted as per Joint Commission's Universal Protocol (UP.01.01.01). Time: 1215 Start Time: 1215 hrs.  Description of Procedure:          Laterality: (see above) Targeted Levels: (see above)  Safety Precautions: Aspiration looking for blood return was conducted prior to all injections. At no point did we inject any substances, as a needle was being advanced. Before injecting, the patient was told to immediately notify me if he was experiencing any new onset of ringing in the ears, or metallic taste in the mouth. No attempts were made at seeking any paresthesias. Safe injection practices and needle disposal techniques used. Medications properly checked for expiration dates. SDV (single dose vial) medications used. After the completion of the procedure, all disposable equipment used was discarded in the proper designated medical waste containers. Local Anesthesia: Protocol guidelines were followed. The patient was positioned over the fluoroscopy table. The area was prepped in the usual manner. The time-out was completed. The target area was identified using fluoroscopy. A 12-in long, straight, sterile hemostat was used with fluoroscopic guidance to locate the targets for each level blocked. Once located, the skin was marked with an approved surgical skin marker. Once all sites were marked, the skin (epidermis, dermis, and hypodermis), as well as deeper tissues (fat, connective tissue and muscle) were infiltrated with a small amount of a short-acting local anesthetic, loaded on a 10cc syringe with a 25G, 1.5-in  Needle. An appropriate amount of time was allowed for local anesthetics to take effect before proceeding to the next step. Local Anesthetic: Lidocaine  2.0% The unused portion of the local anesthetic was  discarded in the proper designated containers. Technical description of process:  Medial Branch  Dorsal Rami Nerve Block (MBB):  Neuroanatomy note: Each lumbar facet joint receives dual innervation from medial branches arising from the posterior primary rami at the same level and one level above. The target for each lumbar medial branch is the junction of the ipsilateral superior articular and transverse process of the lower vertebral body. (i.e.: The L4-L5 facet joint is innervated by the L4 medial branch [located at L5] and the L3 medial branch [located at L4]. Blocking the L4 Medial Branch is therefore achieved by injecting at the junction of the ipsilateral superior articular and transverse process of the lower vertebral body [L5].).  Exception: The exception to the above rule is the L5-S1 facet joint which has triple innervation requiring the L4 medial branch, as well as the L5 and the S1 Dorsal Rami(s) to be blocked to fully denervate the joint.  Under fluoroscopic guidance, a needle was inserted until contact was made with os over the target area. After negative aspiration, 0.5 mL of the nerve block solution was injected without difficulty or complication. Paresthesia were avoided during injection. The needle(s) were removed intact and without complication.  Once the entire procedure was completed, the treated area was cleaned, making sure to leave some of the prepping solution back to take advantage of its long term bactericidal properties.         Illustration of the posterior view of the lumbar spine and the posterior neural structures. Laminae of L2 through S1 are labeled. DPRL5, dorsal primary ramus of L5; DPRS1, dorsal primary ramus of S1; DPR3, dorsal primary ramus of L3; FJ, facet (zygapophyseal) joint L3-L4; I, inferior articular process of L4; LB1, lateral branch of dorsal primary ramus of L1; IAB, inferior articular branches from L3 medial branch (supplies L4-L5 facet joint); IBP,  intermediate branch plexus; MB3, medial branch of dorsal primary ramus of L3; NR3, third lumbar nerve root; S, superior articular process  of L5; SAB, superior articular branches from L4 (supplies L4-5 facet joint also); TP3, transverse process of L3.   Facet Joint Innervation (* possible contribution)  L1-2 T12, L1 (L2*)  Medial Branch  L2-3 L1, L2 (L3*)                     L3-4 L2, L3 (L4*)                     L4-5 L3, L4 (L5*)                     L5-S1 L4, L5, S1                        Vitals:   02/16/24 1215 02/16/24 1220 02/16/24 1225 02/16/24 1232  BP: (!) 140/94 123/88 (!) 135/97 (!) 134/94  Pulse:      Resp: 15 17 15 20   Temp:      TempSrc:      SpO2: 99% 99% 100% 96%  Weight:      Height:         End Time: 1224 hrs.  Imaging Guidance (Spinal):         Type of Imaging Technique: Fluoroscopy Guidance (Spinal) Indication(s): Fluoroscopy guidance for needle placement to enhance accuracy in procedures requiring precise needle localization for targeted delivery of medication in or near specific anatomical locations not easily accessible without such real-time imaging assistance. Exposure Time: Please see nurses notes. Contrast: None used. Fluoroscopic Guidance: I was personally present during the use of fluoroscopy. Tunnel Vision Technique used to obtain the best possible view of the target area. Parallax error corrected before commencing the procedure. Direction-depth-direction technique used to introduce the needle under continuous pulsed fluoroscopy. Once target was reached, antero-posterior, oblique, and lateral fluoroscopic projection used confirm needle placement in all planes. Images permanently stored in EMR. Interpretation: No contrast injected. I personally interpreted the imaging intraoperatively. Adequate needle placement confirmed in multiple planes. Permanent images saved into the patient's record.  Post-operative Assessment:  Post-procedure Vital Signs:   Pulse/HCG Rate: (!) 10568 Temp: 97.7 F (36.5 C) Resp: 20 BP: (!) 134/94 SpO2: 96 %  EBL: None  Complications: No immediate post-treatment complications observed by team, or reported by patient.  Note: The patient tolerated the entire procedure well. A repeat set of vitals were taken after the procedure and the patient was kept under observation following institutional policy, for this type of procedure. Post-procedural neurological assessment was performed, showing return to baseline, prior to discharge. The patient was provided with post-procedure discharge instructions, including a section on how to identify potential problems. Should any problems arise concerning this procedure, the patient was given instructions to immediately contact us , at any time, without hesitation. In any case, we plan to contact the patient by telephone for a follow-up status report regarding this interventional procedure.  Comments:  No additional relevant information.  Plan of Care (POC)  Orders:  Orders Placed This Encounter  Procedures   LUMBAR FACET(MEDIAL BRANCH NERVE BLOCK) MBNB    Scheduling Instructions:     Procedure: Lumbar facet block (AKA.: Lumbosacral medial branch nerve block)     Side: Bilateral     Level: L3-4, L4-5, and L5-S1 Facets (L2, L3, L4, L5, and S1 Medial Branch Nerves)     Sedation: Patient's choice.     Date: 02/16/2024    Where will this procedure be performed?:   [facility]   [facility] C-ARM 1-60 MIN NO REPORT    Intraoperative interpretation by procedural physician at [facility].    Standing Status:   Standing    Number of Occurrences:   1    Reason for exam::   Assistance in needle guidance and placement for procedures requiring needle placement in or near specific anatomical locations not easily accessible without such assistance.   Informed Consent Details: Physician/Practitioner Attestation; Transcribe to consent form and obtain patient  signature    Nursing Order: Transcribe to consent form and obtain patient signature. Note: Always confirm laterality of pain with Mr. Gallaga, before procedure.    Physician/Practitioner attestation of informed consent for procedure/surgical case:   I, the physician/practitioner, attest that I have discussed with the patient the benefits, risks, side effects, alternatives, likelihood of achieving goals and potential problems during recovery for the procedure that I have provided informed consent.    Procedure:   Lumbar Facet Block  under fluoroscopic guidance    Physician/Practitioner performing the procedure:   Dianne Whelchel A. Tanya MD    Indication/Reason:   Low Back Pain, with our without leg pain, due to Facet Joint Arthralgia (Joint Pain) Spondylosis (Arthritis of the Spine), without myelopathy or radiculopathy (Nerve Damage).   Provide equipment / supplies at bedside    Procedure tray: Block Tray (Disposable  single use) Skin infiltration needle: Regular 1.5-in, 25-G, (x1) Block Needle type: Spinal Amount/quantity: 4 Size: Medium (5-inch) Gauge: 22G    Standing Status:   Standing    Number of Occurrences:   1    Specify:   Block Tray   Saline lock IV    Have LR [phone] mL available and administer at 125 mL/hr if patient becomes hypotensive.    Standing Status:   Standing    Number of Occurrences:   1     No opioid analgesics prescribed by our practice. Oxycodone /APAP 5/325 1 tab PO 4 hours (Written by Devere Vannie Perry, PA-C) Highest recorded MME/day: 50 mg/day MME/day: 50 mg/day    Medications ordered for procedure: Meds ordered this encounter  Medications   lidocaine  (XYLOCAINE ) 2 % (with pres) injection 400 mg   pentafluoroprop-tetrafluoroeth (GEBAUERS) aerosol   DISCONTD: midazolam  (VERSED ) 5 MG/5ML injection 0.5-2 mg    Make sure Flumazenil is available in the pyxis when using this medication. If oversedation occurs, administer 0.2 mg IV over 15 sec. If after 45 sec  no response, administer 0.2 mg again over 1 min; may repeat at 1 min intervals; not to exceed 4 doses (1 mg)   DISCONTD: fentaNYL  (SUBLIMAZE ) injection 25-50 mcg    Make sure Narcan is available in the pyxis when using this medication. In the event of respiratory depression (RR< 8/min): Titrate NARCAN (naloxone) in increments of 0.1 to 0.2 mg IV at 2-3 minute intervals, until desired degree of reversal.   ropivacaine  (PF) 2 mg/mL (0.2%) (NAROPIN ) injection 18 mL   triamcinolone  acetonide (KENALOG -40) injection 80 mg   midazolam  (VERSED ) 5 MG/5ML injection 0.5-2 mg    Make sure Flumazenil is available in the pyxis when using this medication. If oversedation occurs, administer 0.2 mg IV over 15 sec. If after 45 sec no response, administer 0.2 mg again over 1 min; may repeat at 1 min intervals; not to exceed 4 doses (1 mg)   Medications administered: We administered lidocaine , ropivacaine  (PF) 2 mg/mL (0.2%), triamcinolone  acetonide, and midazolam .  See the medical record for exact dosing, route, and time of administration.  Interventional Therapies  Risk Factors  Considerations  Medical Comorbidities:  MO (327 lbs) (BMI>35)  T2NIDDM  Stage 5 CKD  ESRD on Dialysis  Uncontrolled HTN  Hx. Paroxysmal V-Tach  CAD  OSA  GERD     Planned  Pending:      Under consideration:      Completed:   Diagnostic/therapeutic bilateral lumbar facet (L2-S1) MBB x4 (06/14/2023) (100/100/100/100)    Therapeutic  Palliative (PRN) options:   Diagnostic/palliative bilateral lumbar facet block #5    Completed by other providers:   Prior patient of Morene Falcon, DO      Follow-up plan:   Return in about 2 weeks (around 03/01/2024) for (Face2F), (PPE).     Recent Visits Date Type Provider Dept  02/02/24 Office Visit Patel, Seema K, NP [facility]  Showing recent visits within past 90 days and meeting all other requirements Today's Visits Date Type Provider Dept  02/16/24  Procedure visit Tanya Glisson, MD [facility]  Showing today's visits and meeting all other requirements Future Appointments No visits were found meeting these conditions. Showing future appointments within next 90 days and meeting all other requirements   Disposition: Discharge home  Discharge (Date  Time): 02/16/2024; 1235 hrs.   Primary Care Physician: Tobie Domino, MD Location: [facility] Note by: Glisson DELENA Tanya, MD (TTS technology used. I apologize for any typographical errors that were not detected and corrected.) Date: 02/16/2024; Time: 12:39 PM  Disclaimer:  Medicine is not an visual merchandiser. The only guarantee in medicine is that nothing is guaranteed. It is important to note that the decision to proceed with this intervention was based on the information collected from the patient. The Data and conclusions were drawn from the patient's questionnaire, the interview, and the physical examination. Because the information was provided in large part by the patient, it cannot be guaranteed that it has not been purposely or unconsciously manipulated. Every effort has been made to obtain as much relevant data as possible for this evaluation. It is important to note that the conclusions that lead to this procedure are derived in large part from the available data. Always take into account that the treatment will also be dependent on availability of resources and existing treatment guidelines, considered by other Pain Management Practitioners as being common knowledge and practice, at the time of the intervention. For Medico-Legal purposes, it is also important to point out that variation in procedural techniques and pharmacological choices are the acceptable norm. The indications, contraindications, technique, and results of the above procedure should only be interpreted and judged by a Board-Certified Interventional Pain Specialist with extensive  familiarity and expertise in the same exact procedure and technique.

## 2024-02-17 ENCOUNTER — Telehealth: Payer: Self-pay | Admitting: *Deleted

## 2024-02-17 NOTE — Telephone Encounter (Signed)
 Called for post procedure follow-up. No answer, no voicemail.

## 2024-02-21 ENCOUNTER — Telehealth: Payer: Self-pay | Admitting: Pain Medicine

## 2024-02-21 NOTE — Telephone Encounter (Signed)
 PT stated that he is at 95% better that he does not need to be seen for follow up to let the nurse know.

## 2024-02-27 ENCOUNTER — Other Ambulatory Visit: Payer: Self-pay | Admitting: Cardiology

## 2024-02-27 DIAGNOSIS — I25810 Atherosclerosis of coronary artery bypass graft(s) without angina pectoris: Secondary | ICD-10-CM

## 2024-02-27 NOTE — Progress Notes (Signed)
 Orders only for Cardiac PET stress test.   Danita Bloch, PA-C

## 2024-02-28 ENCOUNTER — Encounter (HOSPITAL_COMMUNITY): Payer: Self-pay

## 2024-03-01 ENCOUNTER — Encounter: Payer: Self-pay | Admitting: Nurse Practitioner

## 2024-03-01 ENCOUNTER — Other Ambulatory Visit: Payer: Self-pay | Admitting: Nurse Practitioner

## 2024-03-01 ENCOUNTER — Ambulatory Visit
Admission: RE | Admit: 2024-03-01 | Discharge: 2024-03-01 | Disposition: A | Discharge status: Home / Self Care | Source: Ambulatory Visit | Attending: Nurse Practitioner | Admitting: Nurse Practitioner

## 2024-03-01 DIAGNOSIS — E785 Hyperlipidemia, unspecified: Secondary | ICD-10-CM | POA: Diagnosis not present

## 2024-03-01 DIAGNOSIS — Z6841 Body Mass Index (BMI) 40.0 and over, adult: Secondary | ICD-10-CM | POA: Diagnosis not present

## 2024-03-01 DIAGNOSIS — E118 Type 2 diabetes mellitus with unspecified complications: Secondary | ICD-10-CM

## 2024-03-01 DIAGNOSIS — I2510 Atherosclerotic heart disease of native coronary artery without angina pectoris: Secondary | ICD-10-CM | POA: Diagnosis not present

## 2024-03-01 DIAGNOSIS — N185 Chronic kidney disease, stage 5: Secondary | ICD-10-CM | POA: Insufficient documentation

## 2024-03-01 DIAGNOSIS — I10 Essential (primary) hypertension: Secondary | ICD-10-CM

## 2024-03-01 DIAGNOSIS — Z01810 Encounter for preprocedural cardiovascular examination: Secondary | ICD-10-CM | POA: Insufficient documentation

## 2024-03-01 MED ORDER — RUBIDIUM RB82 GENERATOR (RUBYFILL)
25.0000 | PACK | Freq: Once | INTRAVENOUS | Status: AC
  Administered 2024-03-01: 24.72 via INTRAVENOUS

## 2024-03-01 MED ORDER — REGADENOSON 0.4 MG/5ML IV SOLN
INTRAVENOUS | Status: AC
  Filled 2024-03-01: qty 5

## 2024-03-01 MED ORDER — RUBIDIUM RB82 GENERATOR (RUBYFILL)
25.0000 | PACK | Freq: Once | INTRAVENOUS | Status: AC
  Administered 2024-03-01: 24.96 via INTRAVENOUS

## 2024-03-01 MED ORDER — REGADENOSON 0.4 MG/5ML IV SOLN
0.4000 mg | Freq: Once | INTRAVENOUS | Status: AC
  Administered 2024-03-01: 0.4 mg via INTRAVENOUS
  Filled 2024-03-01: qty 5

## 2024-03-02 LAB — NM PET CT CARDIAC PERFUSION MULTI W/ABSOLUTE BLOODFLOW
MBFR: 2.53
Nuc Rest EF: 51 %
Nuc Stress EF: 59 %
Peak HR: 96 {beats}/min
Rest HR: 84 {beats}/min
Rest MBF: 0.53 ml/g/min
Rest Nuclear Isotope Dose: 25 mCi
SDS: 5
SRS: 0
SSS: 5
ST Depression (mm): 0 mm
Stress MBF: 1.34 ml/g/min
Stress Nuclear Isotope Dose: 24.7 mCi
TID: 1.12

## 2024-03-12 ENCOUNTER — Ambulatory Visit: Admitting: Pain Medicine

## 2024-03-15 ENCOUNTER — Other Ambulatory Visit: Payer: Self-pay | Admitting: Dermatology

## 2024-03-15 NOTE — Progress Notes (Addendum)
 Established Patient Visit   Chief Complaint: Chief Complaint  Patient presents with   Follow-up    POC- PET Scan    Date of Service: 03/15/2024 Date of Birth: 02-12-1971 PCP: Donald Lauraine Almarie Vicci, MD [address] [place] [address]  History of Present Illness:   Donald Fernandez is a 53 y.o.male patient that presents for f/u to discuss results.   PMH: CAD Non-sustained VT, 2022 2D ECHO 11/14/2020 revealed normal biventricular systolic function, EF 65-70%, no regional WMA, mild MR, trivial AR Management: metoprolol  HTN, h/o hypotension HLD H/O CVA T2DM ESRD on HD (MWF) OSA on CPAP Obesity Tobacco use COPD  Morbid obesity Bilateral carotid artery disease Carotid ultrasound from 01/2021 revealed bilateral carotid bifurcation plaque resulting in less than 50% diameter ICA stenosis and antegrade bilateral vertebral arterial flow.   Seen 01/24/24 for 6 month follow up today. He is being evaluated for bariatric surgery at [facility]. He is on kidney transplant list at [facility]. He started Mounjaro recently with worsening in his chronic GERD after initiating PPI. He denies chest pain, dyspnea, edema, orthopnea, or heart palpitations. He reports dizziness sometimes after dialysis but none otherwise. Denies syncope. Denies bleeding on aspirin . EKG revealed NSR, HR 98 bpm. PET CT Stress test ordered to r/o ischemia with CAD, risk factors for IHD, and upcoming bariatric surgery and possible transplant.   He presents today for follow up to discuss test results. PET CT Stress test 03/01/24 revealed moderate apical ischemia, rest EF 51%, stress EF 59%.He is doing some walking but is limited by feet pain 2/2 orthopedic issues. Denies exertional chest pain or dyspnea. He started Mounjaro recently with worsening in his chronic GERD despite initiating PPI. He has some slight reflux despite taking PPI, worse if he missed doses. Denies edema, dizziness, presyncope, syncope.   Past Medical  and Surgical History  Past Medical History Past Medical History:  Diagnosis Date   Anemia    Arthritis    Asthma without status asthmaticus (HHS-HCC)    Chronic kidney disease    on dialysis    Diabetes mellitus type 2, uncomplicated (CMS/HHS-HCC)    Hyperlipidemia    Hypertension    Neuropathy    Sleep apnea     Past Surgical History He has a past surgical history that includes other surgery.   Medications and Allergies  Current Medications  Current Outpatient Medications on File Prior to Visit  Medication Sig Dispense Refill   aspirin  81 MG EC tablet Take by mouth daily take 1 tablet by oral route  every day     azelastine (ASTELIN) 137 mcg nasal spray Place 1 spray into both nostrils 2 (two) times daily     calcium  acetate (PHOSLO ) 667 mg capsule Take by mouth every 8 (eight) hours take 1 Capsule by oral route 3 times every day with meals     cholecalciferol, vitamin D3, 400 unit Cap Take by mouth daily take 1 by Oral route  every day     clindamycin  (CLEOCIN  T) 1 % topical gel Apply topically     clobetasoL  (CORMAX ) 0.05 % external solution MIX ENTIRE BOTTLE INTO A JAR OF CERAVE CREAM & APPLY TOPICALLY TO AFFECTED AREAS 2X/DAY, AVOID FACE/GROIN/AXILLA; LONG-TERM USE CAN CAUSE THINNING OF SKIN     famotidine  (PEPCID ) 20 MG tablet Take by mouth     finasteride (PROSCAR) 5 mg tablet Take 5 mg by mouth once daily     gabapentin  (NEURONTIN ) 100 MG capsule Take by mouth every 8 (  eight) hours take 1 Capsule by oral route 3 times every day     ibuprofen  (MOTRIN ) 800 MG tablet      metoprolol  succinate (TOPROL -XL) 100 MG XL tablet Take 1 tablet by mouth once daily     OTEZLA 30 mg tablet Take 30 mg by mouth 2 (two) times daily     pantoprazole  (PROTONIX ) 40 MG DR tablet Take 40 mg by mouth once daily     semaglutide (RYBELSUS) 3 mg tablet Take 3 mg by mouth once daily Do not cut, crush, or chew     tacrolimus (PROTOPIC) 0.1 % ointment APPLY A THIN LAYER TO  AFFECTED AREAS UP TO TWICE DAILY AS NEEDED FOR RASH     tadalafiL (CIALIS) 20 MG tablet Take 20 mg by mouth once daily as needed     tamsulosin  (FLOMAX ) 0.4 mg capsule TAKE 1 CAPSULE BY MOUTH ONCE DAILY *EMERGENCY REFILL*     testosterone  (ANDROGEL ) 20.25 mg/1.25 gram (1.62 %) gel in metered dose pump      tirzepatide (MOUNJARO) 10 mg/0.5 mL pen injector Inject 10 mg subcutaneously every 7 (seven) days     topiramate  (TOPAMAX ) 50 MG tablet Take 50 mg by mouth once daily     traZODone  (DESYREL ) 50 MG tablet Take 50 mg by mouth at bedtime Take 0.5 to 1 tablet by mouth nightly at bedtime for sleep.     vitamin E  1000 UNIT capsule Take 1,340 mg by mouth     ammonium lactate (AMLACTIN) 12 % cream Apply topically (Patient not taking: Reported on 03/15/2024)     cyclobenzaprine (FLEXERIL) 5 MG tablet TAKE 1 TABLET BY MOUTH THREE TIMES DAILY (Patient not taking: Reported on 05/30/2023)     fluticasone  propionate (FLONASE ) 50 mcg/actuation nasal spray Place 2 sprays into both nostrils once daily (Patient not taking: Reported on 05/30/2023)     hydrOXYzine (ATARAX) 25 MG tablet Take 25 mg by mouth 2 (two) times daily (Patient not taking: Reported on 05/30/2023)     loperamide (IMODIUM) 2 mg capsule Take 2 mg by mouth 4 (four) times daily as needed for Diarrhea (Patient not taking: Reported on 05/30/2023)     montelukast  (SINGULAIR ) 10 mg tablet Take 10 mg by mouth once daily (Patient not taking: Reported on 05/30/2023)     sildenafiL (VIAGRA) 100 MG tablet Take 100 mg by mouth once daily as needed for Erectile Dysfunction (Patient not taking: Reported on 05/30/2023)     sodium bicarbonate 325 MG tablet Take 325 mg by mouth once daily (Patient not taking: Reported on 05/30/2023)     tamsulosin  HCl (TAMSULOSIN  ORAL) Take 0.8 mg by mouth daily take 1 capsule by oral route  every day (Patient not taking: Reported on 03/15/2024)     No current facility-administered medications on file prior to visit.     Allergies: Ace inhibitors  Social and Family History  Social History  reports that he has been smoking. He has never used smokeless tobacco. He reports current alcohol use. He reports that he does not use drugs.  Family History Family History  Problem Relation Name Age of Onset   No Known Problems Mother     No Known Problems Father      Review of Systems   Review of Systems  Respiratory:  Negative for shortness of breath.   Cardiovascular:  Negative for chest pain, palpitations, orthopnea and leg swelling.  Gastrointestinal:  Positive for heartburn (recent EGD with polyps, on PPI).  Neurological:  Positive for  dizziness (sometimes after dialysis). Negative for loss of consciousness.  Endo/Heme/Allergies:  Does not bruise/bleed easily.      Physical Examination   Vitals:BP 122/76   Pulse 89   Ht 175.3 cm (5' 9)   Wt (!) 145.2 kg (320 lb)   SpO2 99%   BMI 47.26 kg/m  Ht:175.3 cm (5' 9) Wt:(!) 145.2 kg (320 lb) ADJ:Anib surface area is 2.66 meters squared. Body mass index is 47.26 kg/m.  Physical Exam Vitals reviewed.  Constitutional:      General: He is not in acute distress.    Appearance: Normal appearance. He is obese.  Cardiovascular:     Rate and Rhythm: Normal rate and regular rhythm.     Heart sounds: Normal heart sounds. No murmur heard. Pulmonary:     Effort: Pulmonary effort is normal. No respiratory distress.     Breath sounds: Normal breath sounds.  Musculoskeletal:     Right lower leg: No edema.     Left lower leg: No edema.  Neurological:     Mental Status: He is alert.       Data & Results   No results for input(s): CHOLTOTAL, HDL, LDLCALC, LDLDIRECT, LDL, VLDL, TRIG in the last 73719 hours.  No results for input(s): NA, K, BUN, CREATININE, CO2, GLUCOSE, GLUF, ALT, AST, TBILI, ALB in the last 73719 hours.  No results for input(s): WBC, HGB, HCT, MCV, PLT in the last 73719 hours.   Recent Labs    10/18/23 0901  HGBA1C 5.9*     Nuclear stress test from 01/2021 revealed normal myocardial perfusion with no evidence of stress-induced myocardial ischemia.    Assessment   53 y.o. male with  Encounter Diagnoses  Name Primary?   CKD (chronic kidney disease) stage 5, GFR less than 15 ml/min (CMS/HHS-HCC) Yes   Gastroesophageal reflux disease without esophagitis    Diabetes mellitus type 2 with complications (CMS/HHS-HCC)    Hypertension, essential    BMI 45.0-49.9, adult (CMS-HCC)    Cardiac ischemia    Abnormal nuclear stress test    Coronary artery disease involving native coronary artery of native heart without angina pectoris    Hyperlipidemia, unspecified hyperlipidemia type     Plan   - CAD without chest pain. With worsening of chronic GERD, pt suspects 2/2 Mounjaro initiation. PET CT Stress test with moderate apical ischemia, preserved rest and stress EF. He has upcoming bariatric surgery and is on renal transplant list. Discussed with Dr. Florencio and Dr. Hilarie, proceed with LHC. CCTA would not render definitive results with ESRD. Increase atorvastatin  from 20 mg qd to 80 mg qd. Continue aspirin  81 mg qd and metoprolol  XL 100 mg qd for ASCVD prevention. Lipid levels done by PCP, per pt. Difficult venipuncture with ESRD. Will need BMP and CBC orders sent to HD to be drawn prior to [facility]. Will need ESRD protocol for LHC with contrast. Would be beneficial to schedule on afternoon of T/Th with scheduled HD the following morning (MWF schedule).  - 2D ECHO 11/14/2020 revealed normal biventricular systolic function, EF 65-70%, no regional WMA, mild MR, trivial AR. Performed at diagnosis of non-sustained episode of VT. He denies palpitations or syncope. EF preserved on PET CT Stress test. EKG at last visit reveals NSR. Continue metoprolol  XL 100 mg qd. - Continue T2DM management with PCP. - Continue Mounjaro, weight loss efforts, and following with Bariatric  Surgery. - Continue HD and following with Nephrology.  - Carotid ultrasound from 01/2021 revealed bilateral carotid bifurcation  plaque resulting in less than 50% diameter ICA stenosis and antegrade bilateral vertebral arterial flow. Continue aspirin  and statin therapy.   Follow up after LHC   ADDENDUM 03/22/24: Discussed LHC consent via phone with patient. All questions answered. Pt elects to proceed with LHC. Scheduled for Nurse Visit 12/18 to complete consent. He will need the following at Nurse Visit: 1. Sign consent for LHC  2. Sign release form for 3 providers to release encounter notes and procedure reports:       - Dr. Annitta Georgi, [facility] General and Bariatric Surgery       - Ginnie Blanch, MD at [facility] [place], [facility] Kidney Transplant  * Please send them my Encounter Note from 03/15/24 once consent is signed.  3. CBC and BMP (pre-procedure labs) orders sent to [facility] to be drawn and faxed to us .  4. Schedule cardiac cath on a Tuesday or Thursday first morning case. He gets dialysis on MWF and we will need to monitor him after cath.  MARY LAUREN CHEEK PARRISH, NP  Attestation Statement:   I personally performed the service, non-incident to. [facility])   MARY TINNIE LAUNIE MAIDEN, NP

## 2024-03-29 NOTE — Progress Notes (Signed)
 Consent signed. Labs reviewed. See lab result note. MARY TINNIE LAUNIE MAIDEN, NP

## 2024-04-10 ENCOUNTER — Encounter: Payer: Self-pay | Admitting: Internal Medicine

## 2024-04-10 ENCOUNTER — Other Ambulatory Visit: Payer: Self-pay

## 2024-04-10 ENCOUNTER — Encounter
Admission: RE | Disposition: A | Discharge status: Home / Self Care | Payer: Self-pay | Source: Home / Self Care | Attending: Internal Medicine

## 2024-04-10 ENCOUNTER — Ambulatory Visit
Admission: RE | Admit: 2024-04-10 | Discharge: 2024-04-10 | Disposition: A | Discharge status: Home / Self Care | Attending: Internal Medicine | Admitting: Internal Medicine

## 2024-04-10 DIAGNOSIS — I120 Hypertensive chronic kidney disease with stage 5 chronic kidney disease or end stage renal disease: Secondary | ICD-10-CM | POA: Diagnosis not present

## 2024-04-10 DIAGNOSIS — Z7982 Long term (current) use of aspirin: Secondary | ICD-10-CM | POA: Insufficient documentation

## 2024-04-10 DIAGNOSIS — F17200 Nicotine dependence, unspecified, uncomplicated: Secondary | ICD-10-CM | POA: Insufficient documentation

## 2024-04-10 DIAGNOSIS — I2584 Coronary atherosclerosis due to calcified coronary lesion: Secondary | ICD-10-CM | POA: Diagnosis not present

## 2024-04-10 DIAGNOSIS — E785 Hyperlipidemia, unspecified: Secondary | ICD-10-CM | POA: Diagnosis not present

## 2024-04-10 DIAGNOSIS — Z7985 Long-term (current) use of injectable non-insulin antidiabetic drugs: Secondary | ICD-10-CM | POA: Diagnosis not present

## 2024-04-10 DIAGNOSIS — E1122 Type 2 diabetes mellitus with diabetic chronic kidney disease: Secondary | ICD-10-CM | POA: Insufficient documentation

## 2024-04-10 DIAGNOSIS — R9439 Abnormal result of other cardiovascular function study: Secondary | ICD-10-CM | POA: Insufficient documentation

## 2024-04-10 DIAGNOSIS — N185 Chronic kidney disease, stage 5: Secondary | ICD-10-CM | POA: Diagnosis not present

## 2024-04-10 DIAGNOSIS — Z79899 Other long term (current) drug therapy: Secondary | ICD-10-CM | POA: Diagnosis not present

## 2024-04-10 DIAGNOSIS — I2510 Atherosclerotic heart disease of native coronary artery without angina pectoris: Secondary | ICD-10-CM | POA: Diagnosis not present

## 2024-04-10 DIAGNOSIS — K219 Gastro-esophageal reflux disease without esophagitis: Secondary | ICD-10-CM | POA: Diagnosis not present

## 2024-04-10 HISTORY — PX: LEFT HEART CATH AND CORONARY ANGIOGRAPHY: CATH118249

## 2024-04-10 LAB — GLUCOSE, CAPILLARY
Glucose-Capillary: 88 mg/dL (ref 70–99)
Glucose-Capillary: 60 mg/dL — ABNORMAL LOW (ref 70–99)
Glucose-Capillary: 88 mg/dL (ref 70–99)

## 2024-04-10 SURGERY — LEFT HEART CATH AND CORONARY ANGIOGRAPHY
Anesthesia: Moderate Sedation | Laterality: Left

## 2024-04-10 MED ORDER — SODIUM CHLORIDE 0.9% FLUSH: 3.0000 mL | Freq: Two times a day (BID) | INTRAVENOUS | Status: DC

## 2024-04-10 MED ORDER — HEPARIN (PORCINE) IN NACL 1000-0.9 UT/500ML-% IV SOLN
INTRAVENOUS | Status: AC
  Filled 2024-04-10: qty 1000

## 2024-04-10 MED ORDER — LIDOCAINE HCL (PF) 1 % IJ SOLN
INTRAMUSCULAR | Status: DC | PRN
  Administered 2024-04-10: 2 mL

## 2024-04-10 MED ORDER — FENTANYL CITRATE (PF) 100 MCG/2ML IJ SOLN
INTRAMUSCULAR | Status: DC | PRN
  Administered 2024-04-10: 25 ug via INTRAVENOUS

## 2024-04-10 MED ORDER — ASPIRIN 81 MG PO CHEW
CHEWABLE_TABLET | ORAL | Status: AC
  Filled 2024-04-10: qty 1

## 2024-04-10 MED ORDER — MIDAZOLAM HCL (PF) 2 MG/2ML IJ SOLN
INTRAMUSCULAR | Status: DC | PRN
  Administered 2024-04-10: 1 mg via INTRAVENOUS

## 2024-04-10 MED ORDER — ASPIRIN 81 MG PO CHEW
81.0000 mg | CHEWABLE_TABLET | ORAL | Status: AC
  Administered 2024-04-10: 81 mg via ORAL

## 2024-04-10 MED ORDER — FREE WATER
250.0000 mL | Freq: Once | Status: AC
  Administered 2024-04-10: 250 mL via ORAL

## 2024-04-10 MED ORDER — LIDOCAINE HCL 1 % IJ SOLN
INTRAMUSCULAR | Status: AC
  Filled 2024-04-10: qty 20

## 2024-04-10 MED ORDER — IOHEXOL 300 MG/ML  SOLN
INTRAMUSCULAR | Status: DC | PRN
  Administered 2024-04-10: 345 mL

## 2024-04-10 MED ORDER — SODIUM CHLORIDE 0.9 % IV SOLN
250.0000 mL | INTRAVENOUS | Status: DC | PRN
  Administered 2024-04-10: 250 mL via INTRAVENOUS

## 2024-04-10 MED ORDER — FENTANYL CITRATE (PF) 100 MCG/2ML IJ SOLN
INTRAMUSCULAR | Status: AC
  Filled 2024-04-10: qty 2

## 2024-04-10 MED ORDER — HEPARIN (PORCINE) IN NACL 2000-0.9 UNIT/L-% IV SOLN
INTRAVENOUS | Status: DC | PRN
  Administered 2024-04-10: 1000 mL

## 2024-04-10 MED ORDER — SODIUM CHLORIDE 0.9% FLUSH: 3.0000 mL | INTRAVENOUS | Status: DC | PRN

## 2024-04-10 MED ORDER — FREE WATER
250.0000 mL | Freq: Once | Status: AC
  Administered 2024-04-10: 250 mL

## 2024-04-10 MED ORDER — ONDANSETRON HCL 4 MG/2ML IJ SOLN: 4.0000 mg | Freq: Four times a day (QID) | INTRAMUSCULAR | Status: DC | PRN

## 2024-04-10 MED ORDER — ACETAMINOPHEN 325 MG PO TABS: 650.0000 mg | ORAL_TABLET | ORAL | Status: DC | PRN

## 2024-04-10 MED ORDER — HYDRALAZINE HCL 20 MG/ML IJ SOLN: 10.0000 mg | INTRAMUSCULAR | Status: DC | PRN

## 2024-04-10 MED ORDER — MIDAZOLAM HCL 2 MG/2ML IJ SOLN
INTRAMUSCULAR | Status: AC
  Filled 2024-04-10: qty 2

## 2024-04-10 MED ORDER — FREE WATER: 500.0000 mL | Freq: Once | Status: DC

## 2024-04-10 MED ORDER — SODIUM CHLORIDE 0.9 % IV SOLN: 250.0000 mL | INTRAVENOUS | Status: DC | PRN

## 2024-04-10 SURGICAL SUPPLY — 9 items
CATH INFINITI 5 FR MPA2 (CATHETERS) IMPLANT
CATH INFINITI 5FR MULTPACK ANG (CATHETERS) IMPLANT
DEVICE CLOSURE MYNXGRIP 5F (Vascular Products) IMPLANT
NEEDLE PERC 18GX7CM (NEEDLE) IMPLANT
PACK CARDIAC CATH (CUSTOM PROCEDURE TRAY) ×1 IMPLANT
SET ATX-X65L (MISCELLANEOUS) IMPLANT
SHEATH AVANTI 5FR X 11CM (SHEATH) IMPLANT
STATION PROTECTION PRESSURIZED (MISCELLANEOUS) IMPLANT
WIRE GUIDERIGHT .035X150 (WIRE) IMPLANT

## 2024-04-11 ENCOUNTER — Encounter: Payer: Self-pay | Admitting: Internal Medicine

## 2024-04-11 LAB — CARDIAC CATHETERIZATION: Cath EF Quantitative: 75 %

## 2024-05-09 ENCOUNTER — Ambulatory Visit: Attending: Pain Medicine | Admitting: Pain Medicine

## 2024-05-09 ENCOUNTER — Encounter: Payer: Self-pay | Admitting: Pain Medicine

## 2024-05-09 VITALS — BP 91/50 | HR 92 | Temp 98.1°F | Resp 16 | Ht 69.0 in | Wt 317.5 lb

## 2024-05-09 DIAGNOSIS — M47816 Spondylosis without myelopathy or radiculopathy, lumbar region: Secondary | ICD-10-CM | POA: Diagnosis not present

## 2024-05-09 DIAGNOSIS — M5459 Other low back pain: Secondary | ICD-10-CM | POA: Insufficient documentation

## 2024-05-09 DIAGNOSIS — M545 Low back pain, unspecified: Secondary | ICD-10-CM | POA: Diagnosis not present

## 2024-05-09 DIAGNOSIS — Z09 Encounter for follow-up examination after completed treatment for conditions other than malignant neoplasm: Secondary | ICD-10-CM | POA: Insufficient documentation

## 2024-05-09 DIAGNOSIS — G8929 Other chronic pain: Secondary | ICD-10-CM | POA: Insufficient documentation

## 2024-05-09 NOTE — Patient Instructions (Signed)
 " ______________________________________________________________________    Procedure instructions  Stop blood-thinners  Do not eat or drink fluids (other than water ) for 8 hours before your procedure  No water  for 2 hours before your procedure  Take your blood pressure medicine with a sip of water   Arrive 30 minutes before your appointment  If sedation is planned, bring suitable driver. [place], [place], & public transportation are NOT APPROVED)  Carefully read the Preparing for your procedure detailed instructions  If you have questions call us  at [phone]  Procedure appointments are for procedures only.   NO medication refills or new problem evaluations will be done on procedure days.   Only the scheduled, pre-approved procedure and side will be done.   ______________________________________________________________________     ______________________________________________________________________    Preparing for your procedure  Appointments: If you think you may not be able to keep your appointment, call 24-48 hours in advance to cancel. We need time to make it available to others.  Procedure visits are for procedures only. During your procedure appointment there will be: NO Prescription Refills*. NO medication changes or discussions*. NO discussion of disability issues*. NO unrelated pain problem evaluations*. NO evaluations to order other pain procedures*. *These will be addressed at a separate and distinct evaluation encounter on the provider's evaluation schedule and not during procedure days.  Instructions: Food intake: Avoid eating anything solid for at least 8 hours prior to your procedure. Clear liquid intake: You may take clear liquids such as water  up to 2 hours prior to your procedure. (No carbonated drinks. No soda.) Transportation: Unless otherwise stated by your physician, bring a driver. (Driver cannot be a [employer]earcher, Pharmacist, Community, or any other form of public  transportation.) Morning Medicines: Except for blood thinners, take all of your other morning medications with a sip of water . Make sure to take your heart and blood pressure medicines. If your blood pressure's lower number is above 100, the case will be rescheduled. Blood thinners: Make sure to stop your blood thinners as instructed.  If you take a blood thinner, but were not instructed to stop it, call our office [phone] and ask to talk to a nurse. Not stopping a blood thinner prior to certain procedures could lead to serious complications. Diabetics on insulin: Notify the staff so that you can be scheduled 1st case in the morning. If your diabetes requires high dose insulin, take only  of your normal insulin dose the morning of the procedure and notify the staff that you have done so. Preventing infections: Shower with an antibacterial soap the morning of your procedure.  Build-up your immune system: Take 1000 mg of Vitamin C with every meal (3 times a day) the day prior to your procedure. Antibiotics: Inform the nursing staff if you are taking any antibiotics or if you have any conditions that may require antibiotics prior to procedures. (Example: recent joint implants)   Pregnancy: If you are pregnant make sure to notify the nursing staff. Not doing so may result in injury to the fetus, including death.  Sickness: If you have a cold, fever, or any active infections, call and cancel or reschedule your procedure. Receiving steroids while having an infection may result in complications. Arrival: You must be in the facility at least 30 minutes prior to your scheduled procedure. Tardiness: Your scheduled time is also the cutoff time. If you do not arrive at least 15 minutes prior to your procedure, you will be rescheduled.  Children: Do not bring any children  with you. Make arrangements to keep them home. Dress appropriately: There is always a possibility that your clothing may get soiled. Avoid  long dresses. Valuables: Do not bring any jewelry or valuables.  Reasons to call and reschedule or cancel your procedure: (Following these recommendations will minimize the risk of a serious complication.) Surgeries: Avoid having procedures within 2 weeks of any surgery. (Avoid for 2 weeks before or after any surgery). Flu Shots: Avoid having procedures within 2 weeks of a flu shots or . (Avoid for 2 weeks before or after immunizations). Barium: Avoid having a procedure within 7-10 days after having had a radiological study involving the use of radiological contrast. (Myelograms, Barium swallow or enema study). Heart attacks: Avoid any elective procedures or surgeries for the initial 6 months after a Myocardial Infarction (Heart Attack). Blood thinners: It is imperative that you stop these medications before procedures. Let us  know if you if you take any blood thinner.  Infection: Avoid procedures during or within two weeks of an infection (including chest colds or gastrointestinal problems). Symptoms associated with infections include: Localized redness, fever, chills, night sweats or profuse sweating, burning sensation when voiding, cough, congestion, stuffiness, runny nose, sore throat, diarrhea, nausea, vomiting, cold or Flu symptoms, recent or current infections. It is specially important if the infection is over the area that we intend to treat. Heart and lung problems: Symptoms that may suggest an active cardiopulmonary problem include: cough, chest pain, breathing difficulties or shortness of breath, dizziness, ankle swelling, uncontrolled high or unusually low blood pressure, and/or palpitations. If you are experiencing any of these symptoms, cancel your procedure and contact your primary care physician for an evaluation.  Remember:  Regular Business hours are:  Monday to Thursday 8:00 AM to 4:00 PM  Provider's Schedule: Eric Como, MD:  Procedure days: Tuesday and Thursday 7:30  AM to 4:00 PM  Wallie Sherry, MD:  Procedure days: Monday and Wednesday 7:30 AM to 4:00 PM Last  Updated: 03/22/2023 ______________________________________________________________________     ______________________________________________________________________    General Risks and Possible Complications  Patient Responsibilities: It is important that you read this as it is part of your informed consent. It is our duty to inform you of the risks and possible complications associated with treatments offered to you. It is your responsibility as a patient to read this and to ask questions about anything that is not clear or that you believe was not covered in this document.  Patients Rights: You have the right to refuse treatment. You also have the right to change your mind, even after initially having agreed to have the treatment done. However, under this last option, if you wait until the last second to change your mind, you may be charged for the materials used up to that point.  Introduction: Medicine is not an visual merchandiser. Everything in Medicine, including the lack of treatment(s), carries the potential for danger, harm, or loss (which is by definition: Risk). In Medicine, a complication is a secondary problem, condition, or disease that can aggravate an already existing one. All treatments carry the risk of possible complications. The fact that a side effects or complications occurs, does not imply that the treatment was conducted incorrectly. It must be clearly understood that these can happen even when everything is done following the highest safety standards.  No treatment: You can choose not to proceed with the proposed treatment alternative. The PRO(s) would include: avoiding the risk of complications associated with the therapy. The CON(s) would  include: not getting any of the treatment benefits. These benefits fall under one of three categories: diagnostic; therapeutic; and/or  palliative. Diagnostic benefits include: getting information which can ultimately lead to improvement of the disease or symptom(s). Therapeutic benefits are those associated with the successful treatment of the disease. Finally, palliative benefits are those related to the decrease of the primary symptoms, without necessarily curing the condition (example: decreasing the pain from a flare-up of a chronic condition, such as incurable terminal cancer).  General Risks and Complications: These are associated to most interventional treatments. They can occur alone, or in combination. They fall under one of the following six (6) categories: no benefit or worsening of symptoms; bleeding; infection; nerve damage; allergic reactions; and/or death. No benefits or worsening of symptoms: In Medicine there are no guarantees, only probabilities. No healthcare provider can ever guarantee that a medical treatment will work, they can only state the probability that it may. Furthermore, there is always the possibility that the condition may worsen, either directly, or indirectly, as a consequence of the treatment. Bleeding: This is more common if the patient is taking a blood thinner, either prescription or over the counter (example: Goody Powders, Fish oil, Aspirin , Garlic, etc.), or if suffering a condition associated with impaired coagulation (example: Hemophilia, cirrhosis of the liver, low platelet counts, etc.). However, even if you do not have one on these, it can still happen. If you have any of these conditions, or take one of these drugs, make sure to notify your treating physician. Infection: This is more common in patients with a compromised immune system, either due to disease (example: diabetes, cancer, human immunodeficiency virus [HIV], etc.), or due to medications or treatments (example: therapies used to treat cancer and rheumatological diseases). However, even if you do not have one on these, it can still  happen. If you have any of these conditions, or take one of these drugs, make sure to notify your treating physician. Nerve Damage: This is more common when the treatment is an invasive one, but it can also happen with the use of medications, such as those used in the treatment of cancer. The damage can occur to small secondary nerves, or to large primary ones, such as those in the spinal cord and brain. This damage may be temporary or permanent and it may lead to impairments that can range from temporary numbness to permanent paralysis and/or brain death. Allergic Reactions: Any time a substance or material comes in contact with our body, there is the possibility of an allergic reaction. These can range from a mild skin rash (contact dermatitis) to a severe systemic reaction (anaphylactic reaction), which can result in death. Death: In general, any medical intervention can result in death, most of the time due to an unforeseen complication. ______________________________________________________________________      ______________________________________________________________________    Steroid injections  Common steroids for injections Triamcinolone : Used by many sports medicine physicians for large joint and bursal injections, often combined with a local anesthetic like lidocaine . A study focusing on coccydynia (tailbone pain) found triamcinolone  was more effective than betamethasone, suggesting it may also be preferable for other localized inflammation conditions. Methylprednisolone: A common alternative to triamcinolone  that is also a strong anti-inflammatory. It is available in different formulations, with the acetate suspension being the long-acting option for intra-articular injections. Dexamethasone : This is a non-particulate steroid, meaning it has a lower risk of tissue damage compared to particulate steroids like triamcinolone  and methylprednisolone. While less common for this specific  use, it is an option for targeted injections.   Considerations for physicians Particulate vs. non-particulate steroids: Triamcinolone  and methylprednisolone are particulate, meaning they can clump together. Dexamethasone  is non-particulate. Particulate steroids are often preferred for their longer-lasting effects but carry a theoretical higher risk for certain injections (though this is less of a concern in the costochondral joints). Combined injectate: Corticosteroids are typically mixed with a local anesthetic like lidocaine  to provide both immediate pain relief (from the anesthetic) and longer-term inflammation reduction (from the steroid). Imaging guidance: To ensure accurate placement of the needle and medication, physicians may use ultrasound or fluoroscopic guidance for the injection, especially in complex or refractory cases.   Patient guidance Before undergoing a steroid injection, discuss the options with your physician. They will determine the best steroid, dosage, and procedure for your specific case based on factors like: Severity of your condition History of response to other treatments Your overall health status Experience and preference of the physician  Last  Updated: 12/06/2023 ______________________________________________________________________     "

## 2024-05-09 NOTE — Progress Notes (Signed)
 PROVIDER NOTE: Interpretation of information contained herein should be left to medically-trained personnel. Specific patient instructions are provided elsewhere under Patient Instructions section of medical record. This document was created in part using AI and STT-dictation technology, any transcriptional errors that may result from this process are unintentional.  Patient: Donald Fernandez  Service: E/M   PCP: Tobie Domino, MD  DOB: [birth year]  DOS: 05/09/2024  Provider: Eric DELENA Como, MD  MRN: [id number]  Delivery: Face-to-face  Specialty: Interventional Pain Management  Type: Established Patient  Setting: Ambulatory outpatient facility  Specialty designation: 09  Referring Prov.: Tobie Domino, MD  Location: Outpatient office facility       History of present illness (HPI) Donald Fernandez, a 54 y.o. year old male, is here today because of his Chronic bilateral low back pain without sciatica [M54.50, G89.29]. Donald Fernandez primary complain today is Back Pain (Mid back pain bilateral )  Pertinent problems: Donald Fernandez has Personal history of gout; Lumbar facet hypertrophy (Multilevel) (Bilateral); Lumbar foraminal stenosis (Multilevel) (Bilateral); DDD (degenerative disc disease), lumbosacral; Sacroiliitis (Bilateral); Osteoarthritis of lumbar spine; Osteoarthritis of facet joint of lumbar spine; Chronic pain syndrome; Chronic gout 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 tophus; Chronic low back pain (1ry area of Pain) (Bilateral) (L>R) w/o sciatica; Lumbar facet syndrome (Bilateral) (L>R); Chronic lower extremity pain (Bilateral) (L>R); Spondylosis without myelopathy or radiculopathy, lumbosacral region; Lumbar facet joint pain; Fixation hardware in leg; Chronic pain of right ankle; and Chronic low back pain radiating to legs (Bilateral) on their pertinent problem list.  Pain Assessment: Severity of Chronic pain is reported as a 6 /10. Location: Back Lower, Mid, Left, Right/denies. Onset: More than a  month ago. Quality: Discomfort, Nagging. Timing: Intermittent. Modifying factor(s): BLF helped for approx 3 weeks.Tylenol  CR. Vitals:  height is 5' 9 (1.753 m) and weight is 317 lb 7.4 oz (144 kg) (abnormal). His temporal temperature is 98.1 F (36.7 C). His blood pressure is 91/50 (abnormal) and his pulse is 92. His respiration is 16 and oxygen saturation is 100%.  BMI: Estimated body mass index is 46.88 kg/m as calculated from the following:   Height as of this encounter: 5' 9 (1.753 m).   Weight as of this encounter: 317 lb 7.4 oz (144 kg).  Last encounter: 06/28/2023. Last procedure: 02/16/2024.  Reason for encounter: post-procedure evaluation and assessment.   Discussed the use of AI scribe software for clinical note transcription with the patient, who gave verbal consent to proceed.  History of Present Illness   Donald Fernandez is a 54 year old male with lumbar arthritis who presents with recurrent lumbar pain.  He underwent bilateral L3-4, L4-5, and L5-S1 lumbar facet blocks on February 16, 2024, with excellent relief for about three weeks before pain recurred. The pain is now higher in the lumbar region above the kidneys, has gradually worsened, is persistent, and limits his daily activities.      Post-Procedure Evaluation   Type: Lumbar Facet, Medial Branch Block(s) #5  Laterality: Bilateral  Level: L2, L3, L4, L5, and S1 Medial Branch/Dorsal Rami Level(s). Injecting these levels blocks the L3-4, L4-5, and L5-S1 lumbar facet joints. Imaging: Fluoroscopic guidance Spinal ([id number]) Anesthesia: Local anesthesia (1-2% Lidocaine ) Anxiolysis: IM Versed  2.0 mg Sedation: No Sedation                       Difficult IV stick. IV lost in transit.  DOS: 02/16/2024 Performed by: Eric DELENA Como, MD  Primary Purpose: Diagnostic/Therapeutic  Indications: Low back pain severe enough to impact quality of life or function. 1. Chronic low back pain (1ry area of Pain) (Bilateral) (L>R)  w/o sciatica   2. Lumbar facet joint pain   3. Lumbar facet hypertrophy (Multilevel) (Bilateral)   4. Lumbar facet syndrome (Bilateral) (L>R)   5. Osteoarthritis of facet joint of lumbar spine   6. Osteoarthritis of lumbar spine   7. Spondylosis without myelopathy or radiculopathy, lumbosacral region   8. Chronic low back pain radiating to legs (Bilateral)   9. Chronic lower extremity pain (Bilateral) (L>R)    NAS-11 Pain score:   Pre-procedure: 7 /10   Post-procedure: 0-No pain/10     Effectiveness:  Initial hour after procedure: 100 %. Subsequent 4-6 hours post-procedure: 100 %. Analgesia past initial 6 hours: 100 % (great pain relief x 3 weeks and at then the pain returned in a couple of spots,  overall good pain relief.). Ongoing improvement:  Analgesic: The patient indicated having attained 100% relief of the pain for the duration of the local anesthetic which in fact continue for an additional 3 weeks after which the pain started to return but had a spot just above where the prior treatment had taken place. Function: Donald Fernandez reports improvement in function ROM: Donald Fernandez reports improvement in ROM Interpretation: Based on the above information, I believe the problem resides in the lumbar facets, above the previously treated area.  An MRI of the lumbar spine dating back to 03/06/2019 had already documented posterior element hypertrophy at the L2-3 level, which we did not treat.  At this point the plan is to have the patient return for facet blocks at the upper lumbar region.  We will use fluoroscopic mapping to determine the affected area.  Pharmacotherapy Assessment   No opioid analgesics prescribed by our practice. Oxycodone /APAP 5/325 1 tab PO 4 hours (Written by Devere Vannie Perry, PA-C) Highest recorded MME/day: 50 mg/day MME/day: 50 mg/day   Monitoring: [place] PMP: PDMP reviewed during this encounter.       Pharmacotherapy: No side-effects or adverse reactions  reported. Compliance: No problems identified. Effectiveness: Clinically acceptable.  No notes on file  UDS:  No results found for: SUMMARY  No results found for: CBDTHCR No results found for: D8THCCBX No results found for: D9THCCBX  ROS  Constitutional: Denies any fever or chills Gastrointestinal: No reported hemesis, hematochezia, vomiting, or acute GI distress Musculoskeletal: Denies any acute onset joint swelling, redness, loss of ROM, or weakness Neurological: No reported episodes of acute onset apraxia, aphasia, dysarthria, agnosia, amnesia, paralysis, loss of coordination, or loss of consciousness  Medication Review  Cholecalciferol, Coenzyme Q10, Cyanocobalamin, Ruxolitinib Phosphate, Tadalafil, Testosterone , Vitamin D , Vitamin E , ammonium lactate, aspirin  EC, atorvastatin , clindamycin , clobetasol , famotidine , fluticasone , gabapentin , pantoprazole , sevelamer carbonate, sildenafil, tacrolimus, tamsulosin , and tirzepatide  History Review  Allergy: Donald Fernandez is allergic to accupril [quinapril hcl] and ace inhibitors. Drug: Donald Fernandez  reports that he does not currently use drugs after having used the following drugs: Marijuana. Alcohol:  reports that he does not currently use alcohol. Tobacco:  reports that he quit smoking about 10 months ago. His smoking use included cigarettes. He has never used smokeless tobacco. Social: Donald Fernandez  reports that he quit smoking about 10 months ago. His smoking use included cigarettes. He has never used smokeless tobacco. He reports that he does not currently use alcohol. He reports that he does not currently use drugs after having used the following drugs: Marijuana. Medical:  has a  past medical history of Arthritis, Asthma, BPH (benign prostatic hyperplasia), Diabetes mellitus without complication (HCC), Dialysis patient, GERD (gastroesophageal reflux disease), Gout, Hyperlipidemia, Hypertension, Neuropathy, Renal disorder, Restless legs,  and Sleep apnea. Surgical: Donald Fernandez  has a past surgical history that includes Ankle surgery (Right); AV fistula placement (Left, 08/04/2016); IR ANGIO INTRA EXTRACRAN SEL INTERNAL CAROTID UNI R MOD SED (12/17/2016); Radiology with anesthesia (N/A, 12/17/2016); DIALYSIS/PERMA CATHETER INSERTION (N/A, 06/06/2017); A/V Fistulagram (N/A, 06/08/2017); AV Fistula Insertion w/RF Magnetic Guidance (Left, 09/02/2017); Upper Extremity Angiography (Left, 10/19/2017); Revison of arteriovenous fistula (Left, 01/04/2018); DIALYSIS/PERMA CATHETER REMOVAL (N/A, 05/04/2018); and LEFT HEART CATH AND CORONARY ANGIOGRAPHY (Left, 04/10/2024). Family: family history includes AAA (abdominal aortic aneurysm) in his mother; Diabetes in his mother; Heart disease in his father.  Laboratory Chemistry Profile   Renal Lab Results  Component Value Date   BUN 52 (H) 06/10/2023   CREATININE 14.69 (H) 06/10/2023   BCR 4 (L) 11/02/2022   GFRAA 5 (L) 12/08/2019   GFRNONAA 4 (L) 06/10/2023    Hepatic Lab Results  Component Value Date   AST 14 (L) 04/15/2023   ALT 12 04/15/2023   ALBUMIN  3.6 04/15/2023   ALKPHOS 44 04/15/2023   HCVAB <0.1 06/08/2017    Electrolytes Lab Results  Component Value Date   NA 135 06/10/2023   K 4.0 06/10/2023   CL 94 (L) 06/10/2023   CALCIUM  10.1 06/10/2023   MG 2.2 02/07/2023   PHOS 5.3 (H) 06/07/2017    Bone No results found for: VD25OH, CI874NY7UNU, CI6874NY7, CI7874NY7, 25OHVITD1, 25OHVITD2, 25OHVITD3, TESTOFREE, TESTOSTERONE   Inflammation (CRP: Acute Phase) (ESR: Chronic Phase) Lab Results  Component Value Date   LATICACIDVEN 0.7 07/09/2016         Note: Above Lab results reviewed.  Recent Imaging Review  CARDIAC CATHETERIZATION   Ost RCA to Prox RCA lesion is 25% stenosed.   Prox RCA lesion is 90% stenosed.   Mid RCA lesion is 75% stenosed.   Prox LAD lesion is 70% stenosed.   2nd Mrg lesion is 50% stenosed.   Prox Cx lesion is 70% stenosed.   Dist LAD lesion  is 50% stenosed.   There is hyperdynamic left ventricular systolic function.   LV end diastolic pressure is normal.   The left ventricular ejection fraction is greater than 65% by visual  estimate.   In the absence of any other complications or medical issues, we expect  the patient to be ready for discharge from a cath perspective.  Conclusion Left heart cath right groin approach because of dialysis shunt sites in  the arm 6 French sheath placed in right femoral artery under ultrasound guidance  after 10 cc of 1% lidocaine  used to anesthetize the area LV gram Preserved overall left ventricular function EF 60% normal wall motion  Coronaries Suboptimal imaging of the left coronary system unable to adequately engage  the left main Appears to have mild to moderate coronary disease but no significant  obstructive focal disease heavy calcification throughout arteries Right coronary artery extremely large tortuous focal mid lesion mid to  proximal 90% and a more mid to distal 75% lesion TIMI-3 flow heavy  calcification Intervention deferred We will refer the patient to tertiary care facility for consultation and  possible intervention options because of heavy calcification as well as  tortuosity and suboptimal imaging of the left system Continue medical therapy Follow-up with cardiology 2 to 4 weeks Note: Reviewed        Physical Exam  Vitals: BP ROLLEN)  91/50 (BP Location: Right Arm, Patient Position: Supine, Cuff Size: Normal) Comment (Cuff Size): forearm  Pulse 92   Temp 98.1 F (36.7 C) (Temporal)   Resp 16   Ht 5' 9 (1.753 m)   Wt (!) 317 lb 7.4 oz (144 kg)   SpO2 100%   BMI 46.88 kg/m  BMI: Estimated body mass index is 46.88 kg/m as calculated from the following:   Height as of this encounter: 5' 9 (1.753 m).   Weight as of this encounter: 317 lb 7.4 oz (144 kg). Ideal: Ideal body weight: 70.7 kg (155 lb 13.8 oz) Adjusted ideal body weight: 100 kg (220 lb 8.1  oz) General appearance: Well nourished, well developed, and well hydrated. In no apparent acute distress Mental status: Alert, oriented x 3 (person, place, & time)       Respiratory: No evidence of acute respiratory distress Eyes: PERLA   Assessment   Diagnosis Status  1. Chronic low back pain (1ry area of Pain) (Bilateral) (L>R) w/o sciatica   2. Lumbar facet joint pain   3. Lumbar facet syndrome (Bilateral) (L>R)   4. Postop check    Controlled Controlled Controlled   Updated Problems: No problems updated.  Plan of Care  Problem-specific:  Assessment and Plan    Lumbar facet joint syndrome   Chronic lumbar facet joint syndrome has worsened, with pain now higher in the lumbar region, indicating possible involvement of higher facet joints. Previous bilateral L3-4, L4-5, and L5-S1 facet blocks relieved pain for three weeks. The condition likely stems from wear and tear arthritis affecting multiple spinal levels. A diagnostic facet block in the mid back or upper lumbar region is scheduled to identify the involved levels, using X-ray guidance for mapping. Coordination with the front desk will ensure scheduling on Tuesday or Thursday.  Post-procedural follow-up for lumbar facet block   Following the lumbar facet block on November 6th, 2025, which initially provided excellent relief, this follow-up assesses pain recurrence and plans further intervention. Ensure at least two weeks between steroid injections to prevent adrenal suppression. Maintain communication about procedures done elsewhere to coordinate care.       Donald Fernandez has a current medication list which includes the following long-term medication(s): fluticasone  and gabapentin .  Pharmacotherapy (Medications Ordered): No orders of the defined types were placed in this encounter.  Orders:  Orders Placed This Encounter  Procedures   LUMBAR FACET(MEDIAL BRANCH NERVE BLOCK) MBNB    Diagnosis: Lumbar Facet Syndrome  (M47.816); Lumbosacral Facet Syndrome (M47.817); Lumbar Facet Joint Pain (M54.59) Medical Necessity Statement: 1.Severe chronic axial low back pain causing functional impairment documented by ongoing pain scale assessments. 2.Pain present for longer than 3 months (Chronic) documented to have failed noninvasive conservative therapies. 3.Absence of untreated radiculopathy. 4.There is no radiological evidence of untreated fractures, tumor, infection, or deformity.  Physical Examination Findings: Positive Kemp Maneuver: (Y)  Positive Lumbar Hyperextension-Rotation provocative test: (Y)    Standing Status:   Future    Expiration Date:   08/07/2024    Scheduling Instructions:     Procedure: Lumbar facet Block     Type: Medial Branch Block     Side: Bilateral     Purpose: Diagnostic Radiologic Mapping     Level(s): L1-2, L2-3, and TBD by Fluoroscopic Mapping Facets (T12, L1, L2, L3, and TBD Medial Branch)     Procedural Analgesia/Anxiolysis: None required     Timeframe: As soon as schedule allows.    Where will this procedure be  performed?:   [facility]   Nursing Instructions:    Please complete this patient's postprocedure evaluation.    Scheduling Instructions:     Please complete this patient's postprocedure evaluation.     Interventional Therapies  Risk Factors  Considerations  Medical Comorbidities:  MO (327 lbs) (BMI>35)  T2NIDDM  Stage 5 CKD  ESRD on Dialysis  Uncontrolled HTN  Hx. Paroxysmal V-Tach  CAD  OSA  GERD     Planned  Pending:      Under consideration:      Completed:   Diagnostic/therapeutic bilateral lumbar facet (L2-S1) MBB x4 (06/14/2023) (100/100/100/100)    Therapeutic  Palliative (PRN) options:   Diagnostic/palliative bilateral lumbar facet block #5    Completed by other providers:   Prior patient of Morene Falcon, DO     Return for (Clinic):(B) upper L-FCT Blk #1 (no sedation requested).    Recent Visits Date Type Provider  Dept  02/16/24 Procedure visit Tanya Glisson, MD [facility]  Showing recent visits within past 90 days and meeting all other requirements Today's Visits Date Type Provider Dept  05/09/24 Office Visit Tanya Glisson, MD [facility]  Showing today's visits and meeting all other requirements Future Appointments No visits were found meeting these conditions. Showing future appointments within next 90 days and meeting all other requirements  I discussed the assessment and treatment plan with the patient. The patient was provided an opportunity to ask questions and all were answered. The patient agreed with the plan and demonstrated an understanding of the instructions.  Patient advised to call back or seek an in-person evaluation if the symptoms or condition worsens.  Duration of encounter: 30 minutes.  Total time on encounter, as per AMA guidelines included both the face-to-face and non-face-to-face time personally spent by the physician and/or other qualified health care professional(s) on the day of the encounter (includes time in activities that require the physician or other qualified health care professional and does not include time in activities normally performed by clinical staff). Physician's time may include the following activities when performed: Preparing to see the patient (e.g., pre-charting review of records, searching for previously ordered imaging, lab work, and nerve conduction tests) Review of prior analgesic pharmacotherapies. Reviewing PMP Interpreting ordered tests (e.g., lab work, imaging, nerve conduction tests) Performing post-procedure evaluations, including interpretation of diagnostic procedures Obtaining and/or reviewing separately obtained history Performing a medically appropriate examination and/or evaluation Counseling and educating the patient/family/caregiver Ordering medications, tests, or procedures Referring and communicating  with other health care professionals (when not separately reported) Documenting clinical information in the electronic or other health record Independently interpreting results (not separately reported) and communicating results to the patient/ family/caregiver Care coordination (not separately reported)  Note by: Glisson DELENA Tanya, MD (TTS and AI technology used. I apologize for any typographical errors that were not detected and corrected.) Date: 05/09/2024; Time: 5:37 PM

## 2024-05-31 ENCOUNTER — Ambulatory Visit: Admitting: Pain Medicine
